# Patient Record
Sex: Female | Born: 1950 | Race: White | Hispanic: No | Marital: Married | Smoking: Former smoker
Health system: Southern US, Community
[De-identification: ages and names within clinical notes are randomized; demographics above are authoritative.]

## PROBLEM LIST (undated history)

## (undated) DIAGNOSIS — J449 Chronic obstructive pulmonary disease, unspecified: Secondary | ICD-10-CM

## (undated) DIAGNOSIS — I499 Cardiac arrhythmia, unspecified: Secondary | ICD-10-CM

## (undated) DIAGNOSIS — Z87442 Personal history of urinary calculi: Secondary | ICD-10-CM

## (undated) DIAGNOSIS — Z720 Tobacco use: Secondary | ICD-10-CM

## (undated) DIAGNOSIS — I4891 Unspecified atrial fibrillation: Secondary | ICD-10-CM

## (undated) DIAGNOSIS — F419 Anxiety disorder, unspecified: Secondary | ICD-10-CM

## (undated) DIAGNOSIS — R911 Solitary pulmonary nodule: Secondary | ICD-10-CM

## (undated) DIAGNOSIS — N10 Acute tubulo-interstitial nephritis: Secondary | ICD-10-CM

## (undated) DIAGNOSIS — T380X5A Adverse effect of glucocorticoids and synthetic analogues, initial encounter: Secondary | ICD-10-CM

## (undated) DIAGNOSIS — I10 Essential (primary) hypertension: Secondary | ICD-10-CM

## (undated) DIAGNOSIS — I739 Peripheral vascular disease, unspecified: Secondary | ICD-10-CM

## (undated) DIAGNOSIS — C7800 Secondary malignant neoplasm of unspecified lung: Secondary | ICD-10-CM

## (undated) DIAGNOSIS — I255 Ischemic cardiomyopathy: Secondary | ICD-10-CM

## (undated) DIAGNOSIS — J189 Pneumonia, unspecified organism: Secondary | ICD-10-CM

## (undated) DIAGNOSIS — I779 Disorder of arteries and arterioles, unspecified: Secondary | ICD-10-CM

## (undated) DIAGNOSIS — I2510 Atherosclerotic heart disease of native coronary artery without angina pectoris: Secondary | ICD-10-CM

## (undated) DIAGNOSIS — IMO0001 Reserved for inherently not codable concepts without codable children: Secondary | ICD-10-CM

## (undated) DIAGNOSIS — C649 Malignant neoplasm of unspecified kidney, except renal pelvis: Secondary | ICD-10-CM

## (undated) DIAGNOSIS — E785 Hyperlipidemia, unspecified: Secondary | ICD-10-CM

## (undated) DIAGNOSIS — E099 Drug or chemical induced diabetes mellitus without complications: Secondary | ICD-10-CM

## (undated) HISTORY — PX: TONSILLECTOMY: SUR1361

## (undated) HISTORY — DX: Peripheral vascular disease, unspecified: I73.9

## (undated) HISTORY — DX: Essential (primary) hypertension: I10

## (undated) HISTORY — DX: Hyperlipidemia, unspecified: E78.5

## (undated) HISTORY — DX: Ischemic cardiomyopathy: I25.5

## (undated) HISTORY — DX: Atherosclerotic heart disease of native coronary artery without angina pectoris: I25.10

## (undated) HISTORY — DX: Tobacco use: Z72.0

## (undated) HISTORY — PX: CARDIAC CATHETERIZATION: SHX172

## (undated) HISTORY — DX: Chronic obstructive pulmonary disease, unspecified: J44.9

---

## 2009-07-07 HISTORY — PX: CORONARY STENT PLACEMENT: SHX1402

## 2009-08-12 ENCOUNTER — Inpatient Hospital Stay (HOSPITAL_COMMUNITY): Admission: EM | Admit: 2009-08-12 | Discharge: 2009-08-16 | Payer: Self-pay | Admitting: Internal Medicine

## 2009-08-12 ENCOUNTER — Encounter: Payer: Self-pay | Admitting: Emergency Medicine

## 2009-08-12 ENCOUNTER — Ambulatory Visit: Payer: Self-pay | Admitting: Internal Medicine

## 2009-08-12 ENCOUNTER — Ambulatory Visit: Payer: Self-pay | Admitting: Cardiology

## 2009-08-14 ENCOUNTER — Encounter: Payer: Self-pay | Admitting: Cardiology

## 2009-08-14 ENCOUNTER — Encounter (INDEPENDENT_AMBULATORY_CARE_PROVIDER_SITE_OTHER): Payer: Self-pay | Admitting: Internal Medicine

## 2009-08-17 ENCOUNTER — Telehealth: Payer: Self-pay | Admitting: Cardiology

## 2009-08-29 DIAGNOSIS — Z87891 Personal history of nicotine dependence: Secondary | ICD-10-CM | POA: Insufficient documentation

## 2009-08-29 DIAGNOSIS — J449 Chronic obstructive pulmonary disease, unspecified: Secondary | ICD-10-CM | POA: Insufficient documentation

## 2009-08-29 DIAGNOSIS — I10 Essential (primary) hypertension: Secondary | ICD-10-CM

## 2009-08-30 ENCOUNTER — Encounter: Payer: Self-pay | Admitting: Physician Assistant

## 2009-08-30 ENCOUNTER — Ambulatory Visit: Payer: Self-pay | Admitting: Cardiology

## 2009-08-30 DIAGNOSIS — F17200 Nicotine dependence, unspecified, uncomplicated: Secondary | ICD-10-CM | POA: Insufficient documentation

## 2009-08-30 DIAGNOSIS — I25119 Atherosclerotic heart disease of native coronary artery with unspecified angina pectoris: Secondary | ICD-10-CM | POA: Insufficient documentation

## 2009-08-30 DIAGNOSIS — I739 Peripheral vascular disease, unspecified: Secondary | ICD-10-CM

## 2009-10-12 ENCOUNTER — Encounter: Payer: Self-pay | Admitting: Cardiovascular Disease

## 2009-10-12 ENCOUNTER — Ambulatory Visit: Payer: Self-pay

## 2009-10-15 ENCOUNTER — Ambulatory Visit: Payer: Self-pay | Admitting: Cardiovascular Disease

## 2009-10-24 ENCOUNTER — Ambulatory Visit: Payer: Self-pay | Admitting: Cardiology

## 2009-10-24 ENCOUNTER — Encounter (INDEPENDENT_AMBULATORY_CARE_PROVIDER_SITE_OTHER): Payer: Self-pay | Admitting: *Deleted

## 2009-10-24 ENCOUNTER — Ambulatory Visit: Payer: Self-pay

## 2009-10-24 ENCOUNTER — Ambulatory Visit (HOSPITAL_COMMUNITY): Admission: RE | Admit: 2009-10-24 | Discharge: 2009-10-24 | Payer: Self-pay | Admitting: Cardiovascular Disease

## 2010-02-18 ENCOUNTER — Telehealth: Payer: Self-pay | Admitting: Cardiovascular Disease

## 2010-04-09 ENCOUNTER — Ambulatory Visit: Payer: Self-pay | Admitting: Cardiovascular Disease

## 2010-05-06 ENCOUNTER — Ambulatory Visit: Payer: Self-pay | Admitting: Cardiovascular Disease

## 2010-05-06 LAB — CONVERTED CEMR LAB
BUN: 14 mg/dL
Basophils Absolute: 0.1 10*3/uL
Basophils Relative: 0.6 %
CO2: 29 meq/L
Calcium: 9 mg/dL
Chloride: 97 meq/L
Creatinine, Ser: 0.6 mg/dL
Eosinophils Absolute: 0.2 10*3/uL
Eosinophils Relative: 2.1 %
GFR calc non Af Amer: 111.38 mL/min
Glucose, Bld: 93 mg/dL
HCT: 40.1 %
Hemoglobin: 13.8 g/dL
INR: 1.1 — ABNORMAL HIGH
Lymphocytes Relative: 31 %
Lymphs Abs: 2.9 10*3/uL
MCHC: 34.5 g/dL
MCV: 93.8 fL
Monocytes Absolute: 0.6 10*3/uL
Monocytes Relative: 5.9 %
Neutro Abs: 5.7 10*3/uL
Neutrophils Relative %: 60.4 %
Platelets: 314 10*3/uL
Potassium: 4.4 meq/L
Prothrombin Time: 11.4 s
RBC: 4.28 M/uL
RDW: 13.8 %
Sodium: 140 meq/L
WBC: 9.5 10*3/uL

## 2010-05-08 ENCOUNTER — Ambulatory Visit: Payer: Self-pay | Admitting: Cardiovascular Disease

## 2010-05-08 ENCOUNTER — Ambulatory Visit (HOSPITAL_COMMUNITY): Admission: RE | Admit: 2010-05-08 | Discharge: 2010-05-08 | Payer: Self-pay | Admitting: Cardiovascular Disease

## 2010-05-16 ENCOUNTER — Encounter: Payer: Self-pay | Admitting: Cardiovascular Disease

## 2010-05-22 ENCOUNTER — Ambulatory Visit: Payer: Self-pay | Admitting: Cardiovascular Disease

## 2010-06-10 ENCOUNTER — Ambulatory Visit: Payer: Self-pay | Admitting: Cardiovascular Disease

## 2010-06-11 LAB — CONVERTED CEMR LAB
CO2: 29 meq/L (ref 19–32)
Chloride: 101 meq/L (ref 96–112)
Eosinophils Absolute: 0.2 10*3/uL (ref 0.0–0.7)
Glucose, Bld: 96 mg/dL (ref 70–99)
HCT: 40.4 % (ref 36.0–46.0)
Hemoglobin: 14.1 g/dL (ref 12.0–15.0)
INR: 1 (ref 0.8–1.0)
Lymphs Abs: 2.8 10*3/uL (ref 0.7–4.0)
MCHC: 34.8 g/dL (ref 30.0–36.0)
MCV: 94.7 fL (ref 78.0–100.0)
Monocytes Absolute: 0.7 10*3/uL (ref 0.1–1.0)
Neutro Abs: 6.8 10*3/uL (ref 1.4–7.7)
Potassium: 4.6 meq/L (ref 3.5–5.1)
Prothrombin Time: 11.2 s (ref 9.7–11.8)
RBC: 4.27 M/uL (ref 3.87–5.11)
RDW: 14.3 % (ref 11.5–14.6)
Sodium: 137 meq/L (ref 135–145)

## 2010-06-12 ENCOUNTER — Ambulatory Visit (HOSPITAL_COMMUNITY)
Admission: RE | Admit: 2010-06-12 | Discharge: 2010-06-12 | Payer: Self-pay | Source: Home / Self Care | Attending: Cardiovascular Disease | Admitting: Cardiovascular Disease

## 2010-06-20 ENCOUNTER — Ambulatory Visit: Payer: Self-pay | Admitting: Cardiovascular Disease

## 2010-07-03 ENCOUNTER — Encounter: Payer: Self-pay | Admitting: Cardiovascular Disease

## 2010-07-05 ENCOUNTER — Ambulatory Visit: Payer: Self-pay

## 2010-07-05 ENCOUNTER — Ambulatory Visit: Payer: Self-pay | Admitting: Cardiovascular Disease

## 2010-07-09 ENCOUNTER — Telehealth: Payer: Self-pay | Admitting: Cardiovascular Disease

## 2010-07-18 ENCOUNTER — Ambulatory Visit
Admission: RE | Admit: 2010-07-18 | Discharge: 2010-07-18 | Payer: Self-pay | Source: Home / Self Care | Attending: Cardiovascular Disease | Admitting: Cardiovascular Disease

## 2010-07-18 ENCOUNTER — Ambulatory Visit: Admission: RE | Admit: 2010-07-18 | Discharge: 2010-07-18 | Payer: Self-pay | Source: Home / Self Care

## 2010-07-18 ENCOUNTER — Encounter: Payer: Self-pay | Admitting: Cardiovascular Disease

## 2010-08-06 NOTE — Progress Notes (Signed)
Summary: NEW PT HAVING ACID REFLUX WANT TO KNOW WHAT SHE CAN TAKE  Phone Note Call from Patient Call back at Home Phone [phone]   Caller: Patient Summary of Call: PT SAID EVERYTIME SHE EATS SHE GETS ACID REFLUX FEELS LIKE HER FOOD IS COMING BACK UP. Initial call taken by: Judie Grieve,  August 17, 2009 3:15 PM  Follow-up for Phone Call        SPOKE WITH SON INSTRUCTED FOR PT TO TAKE TUMS AND WILL FORWARD TO DR Saliah Crisp  TO GET TX PLAN IF WISHES TO ADD NEW DRUG WILL CALL BACK VERBALIZED UNDERSTANDING. Follow-up by: Scherrie Bateman, LPN,  August 17, 2009 3:28 PM  Additional Follow-up for Phone Call Additional follow up Details #1::        I cannot find any record that i have seen this patient; reflux should be addressed by her primary care. Ferman Hamming, MD, [facility]  August 18, 2009 1:12 PM PER PT, REFLUX IS NO BETTER "I HAVE TRIED EVERYTHING" INSTRUCTED PT TO FOLLOW UP WITH PRIMARY MD.  Francis Dowse HAS AN APPT 08/23/2009  Additional Follow-up by: Charolotte Capuchin, RN,  August 20, 2009 3:14 PM

## 2010-08-06 NOTE — Assessment & Plan Note (Signed)
Summary: PER CHECK OUT/SF   Visit Type:  6 mo f/u Primary Allison [name]:  Dr. Lewis Moccasin  CC:  edema/hands.....bilateral leg pain w/walking...denies cp or sob.  History of Present Illness: [name] with history of HTN, hyperlipidemia, CAD admitted with NSTEMI, respiratory failure February 2011, PVD here today for follow up. She underwent cardiac cath showing severe stenosis of the Circumflex, now s/p DES to the Circumflex. Her EF was found to be 30%, presumed to be non-ischemic in etiology. Echo with EF of 40-45%. She was discharged home on a beta blocker, statin, ASA and ARB. Her femoral angiogram showed a 50% stenosis right SFA. Non-invasive studies in April with right ABI 0.93, Left ABI 0.70. There was suggestion of moderate disease in the left SFA ostium and moderate disease in the right SFA ostium.   She has stopped smoking. No chest pain, No SOB. Occasionally feels palpitations.  These only last for a few seconds. She has developed pain in both legs with walking 100 yards. This is described as cramping both calf muscles. Resolves with rest. No rest pain or ulcerations.    Current Medications (verified): 1)  Aspirin Ec 325 Mg Tbec (Aspirin) .... Take One Tablet By Mouth Daily 2)  Plavix 75 Mg Tabs (Clopidogrel Bisulfate) .Marland Kitchen.. 1 Tab Once Daily 3)  Metoprolol Tartrate 25 Mg Tabs (Metoprolol Tartrate) .Marland Kitchen.. 1 Tab Two Times A Day 4)  Cozaar 100 Mg Tabs (Losartan Potassium) .Marland Kitchen.. 1 Tab Once Daily 5)  Paxil 40 Mg Tabs (Paroxetine Hcl) .Marland Kitchen.. 1 Tab Once Daily  Allergies: 1)  ! Augmentin 2)  ! Codeine 3)  ! Ace Inhibitors  Past History:  Past Medical History:  1. Hypertension   2. COPD.   3. History of tobacco abuse 4. CAD with PCI, DES Circumflex 2/11. Moderate disease LAD. 5. Hyperlipidemia 6. PVD  Past Surgical History: Reviewed history from 10/15/2009 and no changes required. Tonsillectomy  Family History: Reviewed history from 10/15/2009 and no changes required. Mother, deceased,   coronary artery bypass graft  Father-deceased, cirrhosis Brother alive, unkown history  Social History: Reviewed history from 10/15/2009 and no changes required. 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  She has 3 biologic   children as well as 6 adopted children.   She recently quit smoking,  however, she has smoked 1-2 packs of cigarettes daily since the age of  7.  80 pack years.   She does not drink or use illicit drugs.   Review of Systems  The patient denies fatigue, malaise, fever, weight gain/loss, vision loss, decreased hearing, hoarseness, chest pain, palpitations, shortness of breath, prolonged cough, wheezing, sleep apnea, coughing up blood, abdominal pain, blood in stool, nausea, vomiting, diarrhea, heartburn, incontinence, blood in urine, muscle weakness, joint pain, leg swelling, rash, skin lesions, headache, fainting, dizziness, depression, anxiety, enlarged lymph nodes, easy bruising or bleeding, and environmental allergies.         Leg pain with walking. See HPI  Vital Signs:  Patient profile:   60 year old female Height:      67 inches Weight:      169.8 pounds BMI:     26.69 Pulse rate:   72 / minute Pulse rhythm:   regular BP sitting:   148 / 76  (left arm) Cuff size:   large  Vitals Entered By: Danielle Rankin, CMA (April 09, 2010 9:05 AM)  Physical Exam  General:  General: Well developed, well nourished, NAD HEENT: OP clear, mucus membranes moist SKIN: warm, dry Neuro: No  focal deficits Musculoskeletal: Muscle strength 5/5 all ext Psychiatric: Mood and affect normal Neck: No JVD, no carotid bruits, no thyromegaly, no lymphadenopathy. Lungs:Clear bilaterally, no wheezes, rhonci, crackles CV: RRR no murmurs, gallops rubs Abdomen: soft, NT, ND, BS present Extremities: No edema, pulses non-palpable bilateral DP/PT    Echocardiogram  Procedure date:  10/24/2009  Findings:      Left ventricle: The cavity size was normal. Wall thickness was       normal.  Systolic function was normal. The estimated ejection       fraction was in the range of 50% to 55%. There is severe       hypokinesis of the basal-mid inferior myocardium. Doppler       parameters are consistent with high ventricular filling pressure.     - Aortic valve: Mild regurgitation.     - Mitral valve: Calcified annulus. Mild regurgitation.     - Left atrium: The atrium was moderately dilated.     - Right atrium: The atrium was mildly dilated.  Impression & Recommendations:  Problem # 1:  CAD, NATIVE VESSEL (ICD-414.01) Stable. Will need dual antiplatelet therapy with ASA and Plavix for at least one year post DES (placed February 2011).   Her updated medication list for this problem includes:    Aspirin Ec 325 Mg Tbec (Aspirin) .Marland Kitchen... Take one tablet by mouth daily    Plavix 75 Mg Tabs (Clopidogrel bisulfate) .Marland Kitchen... 1 tab once daily    Metoprolol Tartrate 50 Mg Tabs (Metoprolol tartrate) .Marland Kitchen... Take one tablet by mouth twice a day. take one in the am and one in the evening.  Problem # 2:  HYPERTENSION (ICD-401.9)  Increase metoprolol to 50 mg by mouth two times a day.   Her updated medication list for this problem includes:    Aspirin Ec 325 Mg Tbec (Aspirin) .Marland Kitchen... Take one tablet by mouth daily    Metoprolol Tartrate 50 Mg Tabs (Metoprolol tartrate) .Marland Kitchen... Take one tablet by mouth twice a day. take one in the am and one in the evening.    Cozaar 100 Mg Tabs (Losartan potassium) .Marland Kitchen... 1 tab once daily  Her updated medication list for this problem includes:    Aspirin Ec 325 Mg Tbec (Aspirin) .Marland Kitchen... Take one tablet by mouth daily    Metoprolol Tartrate 25 Mg Tabs (Metoprolol tartrate) .Marland Kitchen... 1 tab two times a day    Cozaar 100 Mg Tabs (Losartan potassium) .Marland Kitchen... 1 tab once daily  Problem # 3:  PVD (ICD-443.9) She is having bilateral leg pain with ambulation. Will plan distal aortogram with bilateral lower ext runoff on May 08, 2010. Risks and benefits reviewed. Labs week of  procedure.   Other Orders: PV Procedure (PV Procedure)  Patient Instructions: 1)  Your physician recommends that you schedule a follow-up appointment in: 6 weeks 2)  Your physician has recommended you make the following change in your medication: INCREASE your METOPROLOL to 50mg  by mouth two times a day. 3)  Your physician has requested that you have a peripheral vascular angiogram on Nov.2nd, 2011 @ 10am. This exam is performed at the hospital. During this exam IV contrast is used to look at arterial blood flow.  Please review the information sheet given for details. 4)  Your physician recommends that you return for lab work on Monday OCT. 31, 2011 anytime after 8:30 am. Prescriptions: METOPROLOL TARTRATE 50 MG TABS (METOPROLOL TARTRATE) Take one tablet by mouth twice a day. TAKE ONE IN THE AM AND  ONE IN THE EVENING.  #60 x 3   Entered by:   Whitney Maeola Sarah RN   Authorized by:   Verne Carrow, MD   Signed by:   Ellender Hose RN on 04/09/2010   Method used:   Electronically to        [facility] (retail)       [address]       [place], [place]  [address]       Ph: [phone]       Fax: [phone]   RxID:   [id number]

## 2010-08-06 NOTE — Assessment & Plan Note (Signed)
Summary: per check out with pa/saf   Visit Type:  Follow-up Primary Provider:  Dr. Lewis Moccasin  CC:  No complaints..  History of Present Illness: [name] with history of HTN, hyperlipidemia, CAD admitted with NSTEMI, respiratory failure February 2011, PVD here today for follow up. She underwent cardiac cath showing severe stenosis of the Circumflex, now s/p DES to the Circumflex. Her EF was found to be 30%, presumed to be non-ischemic in etiology. Echo with EF of 40-45%. She was discharged home on a beta blocker, statin, ASA and ARB. Her femoral angiogram showed a 50% stenosis right SFA. Non-invasive studies last week with with right ABI 0.93, Left ABI 0.70. There was suggestion of moderate disease in the left SFA ostium and moderate disease in the right SFA ostium. She has had no claudication or rest pain.   She has stopped smoking. No chest pain, No SOB. Occasionally feels palpitations.  These only last for a few seconds. She has been performing her activities of daily living without problems.   Current Medications (verified): 1)  Aspirin Ec 325 Mg Tbec (Aspirin) .... Take One Tablet By Mouth Daily 2)  Plavix 75 Mg Tabs (Clopidogrel Bisulfate) .Marland Kitchen.. 1 Tab Once Daily 3)  Metoprolol Tartrate 25 Mg Tabs (Metoprolol Tartrate) .Marland Kitchen.. 1 Tab Two Times A Day 4)  Cozaar 100 Mg Tabs (Losartan Potassium) .Marland Kitchen.. 1 Tab Once Daily 5)  Simvastatin 20 Mg Tabs (Simvastatin) .Marland Kitchen.. 1 Tab At Bedtime 6)  Paxil 40 Mg Tabs (Paroxetine Hcl) .Marland Kitchen.. 1 Tab Once Daily  Allergies: 1)  ! Augmentin 2)  ! Codeine 3)  ! Ace Inhibitors  Past History:  Past Medical History:  1. Hypertension   2. COPD.   3. History of tobacco abuse 4. CAD with PCI, DES Circumflex 2/11. Moderate disease LAD. 5. 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   She has 3 biologic   children as well as 6 adopted children.   She recently quit smoking,  however, she has smoked 1-2 packs of cigarettes daily since the age of  49.  80 pack years.   She does not drink or use illicit drugs.   Review of Systems       The patient complains of palpitations.  The patient denies fatigue, malaise, fever, weight gain/loss, vision loss, decreased hearing, hoarseness, chest pain, shortness of breath, prolonged cough, wheezing, sleep apnea, coughing up blood, abdominal pain, blood in stool, nausea, vomiting, diarrhea, heartburn, incontinence, blood in urine, muscle weakness, joint pain, leg swelling, rash, skin lesions, headache, fainting, dizziness, depression, anxiety, enlarged lymph nodes, easy bruising or bleeding, and environmental allergies.    Vital Signs:  Patient profile:   60 year old female Height:      67 inches Weight:      153 pounds BMI:     24.05 Pulse rate:   69 / minute Pulse rhythm:   regular BP sitting:   158 / 70  (left arm) Cuff size:   regular  Vitals Entered By: Danielle Rankin, CMA (October 15, 2009 10:08 AM)  Physical Exam  General:  General: Well developed, well nourished, NAD HEENT: OP clear, mucus membranes moist SKIN: warm, dry Neuro: No focal deficits Musculoskeletal: Muscle strength 5/5 all ext Psychiatric: Mood and affect normal Neck: No JVD, no carotid bruits, no thyromegaly, no lymphadenopathy. Lungs:Clear bilaterally, no wheezes, rhonci, crackles CV:  RRR no murmurs, gallops rubs Abdomen: soft, NT, ND, BS present Extremities: No edema, pulses 1+ right DP/PT. Trace to 1+ left DP/PT.     Arterial Doppler  Procedure date:  10/12/2009  Findings:      Right ABI 0.93. Left ABI 0.70. Moderate stenosis right SFA (1-49%) >50% left SFA stenosis.  Echocardiogram  Procedure date:  08/14/2009  Findings:       Left ventricle: The cavity size was normal. Wall thickness was     increased in a pattern of mild LVH. Systolic  function was mildly     to moderately reduced. The estimated ejection fraction was in the     range of 40% to 45%. There is hypokinesis of the mid-distal     anteroseptal myocardium. There is hypokinesis of the apical     myocardium. Doppler parameters are consistent with high     ventricular filling pressure.   - Aortic valve: Trivial regurgitation.   - Mitral valve: Calcified annulus. Mildly thickened leaflets . Mild     regurgitation.   - Pulmonary arteries: Systolic pressure was mildly increased. PA     peak pressure: 39mm Hg (S).  Impression & Recommendations:  Problem # 1:  CAD, NATIVE VESSEL (ICD-414.01) Stable. NSTEMI February with DES to Circumflex. She has moderate disease in the LAD and the small RCA. Continue current medical therapy as below.   Her updated medication list for this problem includes:    Aspirin Ec 325 Mg Tbec (Aspirin) .Marland Kitchen... Take one tablet by mouth daily    Plavix 75 Mg Tabs (Clopidogrel bisulfate) .Marland Kitchen... 1 tab once daily    Metoprolol Tartrate 25 Mg Tabs (Metoprolol tartrate) .Marland Kitchen... 1 tab two times a day  Problem # 2:  CARDIOMYOPATHY, SECONDARY (ICD-425.9) Etiology unclear at this time. Likely non-ischemic. Continue current therapy. Will repeat echo in next few weeks.   Her updated medication list for this problem includes:    Aspirin Ec 325 Mg Tbec (Aspirin) .Marland Kitchen... Take one tablet by mouth daily    Plavix 75 Mg Tabs (Clopidogrel bisulfate) .Marland Kitchen... 1 tab once daily    Metoprolol Tartrate 25 Mg Tabs (Metoprolol tartrate) .Marland Kitchen... 1 tab two times a day    Cozaar 100 Mg Tabs (Losartan potassium) .Marland Kitchen... 1 tab once daily  Orders: Echocardiogram (Echo)  Problem # 3:  PVD (ICD-443.9) Her non-invasive studies are suggestive of moderate disease, left ABI is reduced more than right ABI. She is having no claudication. No further invasive workup since she is asymptomatic. Repeat ABI 6 months.   Patient Instructions: 1)  Your physician recommends that you schedule a  follow-up appointment in: 6 months 2)  Your physician has requested that you have an echocardiogram.  Echocardiography is a painless test that uses sound waves to create images of your heart. It provides your doctor with information about the size and shape of your heart and how well your heart's chambers and valves are working.  This procedure takes approximately one hour. There are no restrictions for this procedure.

## 2010-08-06 NOTE — Progress Notes (Signed)
Summary: rx plavix  Phone Note Refill Request Message from:  Patient on February 18, 2010 3:26 PM  Refills Requested: Medication #1:  PLAVIX 75 MG TABS 1 tab once daily [facility] [phone]  Initial call taken by: Judie Grieve,  February 18, 2010 3:26 PM    Prescriptions: PLAVIX 75 MG TABS (CLOPIDOGREL BISULFATE) 1 tab once daily  #30 x 11   Entered by:   Danielle Rankin, CMA   Authorized by:   Verne Carrow, MD   Signed by:   Danielle Rankin, CMA on 02/18/2010   Method used:   Electronically to        [facility] (retail)       [address]       [place], [place]  [address]       Ph: [phone]       Fax: [phone]   RxID:   [id number]

## 2010-08-06 NOTE — Assessment & Plan Note (Signed)
Summary: PER CHECK OUT/SF   Visit Type:  6 wk f/u Primary Allison [name]:  Dr. Lewis [name]  CC:  None.  History of Present Illness: [name] with history of HTN, hyperlipidemia, CAD admitted with NSTEMI, respiratory failure February 2011, PVD here today for PV follow up. She underwent cardiac cath showing severe stenosis of the Circumflex, now s/p DES to the Circumflex. Her EF was found to be 30%, presumed to be non-ischemic in etiology. Echo with EF of 40-45%. She was discharged home on a beta blocker, statin, ASA and ARB. Her femoral angiogram showed a  stenosis right SFA. Non-invasive studies in April with right ABI 0.93, Left ABI 0.70. There was suggestion of moderate disease in the left SFA ostium and moderate disease in the right SFA ostium. She had developed pain in both legs with walking 100 yards. This was described as cramping both calf muscles. Resolved with rest. No rest pain or ulcerations. I arranged a distal aortogram with bilateral lower extremity runoff on 05/08/10. She was found to have a a severe stenosis at the ostium of the left SFA and moderate stenosis at the ostium of the right SFA. The ostium of the right profunda femoral artery had a severe stenosis. (Full report below).   She is here today for procedure follow up. She has been doing well. No change in symptoms. No new problems post diagnostic procedure.   Current Medications (verified): 1)  Aspirin Ec 325 Mg Tbec (Aspirin) .... Take One Tablet By Mouth Daily 2)  Plavix 75 Mg Tabs (Clopidogrel Bisulfate) .Marland Kitchen.. 1 Tab Once Daily 3)  Metoprolol Tartrate 50 Mg Tabs (Metoprolol Tartrate) .... Take One Tablet By Mouth Twice A Day. Take One in The Am and One in The Evening. 4)  Cozaar 100 Mg Tabs (Losartan Potassium) .Marland Kitchen.. 1 Tab Once Daily 5)  Paxil 40 Mg Tabs (Paroxetine Hcl) .Marland Kitchen.. 1 Tab Once Daily 6)  Simvastatin 20 Mg Tabs (Simvastatin) .Marland Kitchen.. 1 Tab At Bedtime  Allergies: 1)  ! Augmentin 2)  ! Codeine 3)  ! Ace Inhibitors  Past  History:  Past Medical History:  1. Hypertension   2. COPD.   3. History of tobacco abuse 4. CAD with PCI, DES Circumflex 2/11. Moderate disease LAD. 5. Hyperlipidemia 6. PVD- lower ext runoff 05/08/10   Past Surgical History: Reviewed history from 10/15/2009 and no changes required. Tonsillectomy  Family History: Reviewed history from 10/15/2009 and no changes required. Mother, deceased,  coronary artery bypass graft  Father-deceased, cirrhosis Brother alive, unkown history  Social History: Reviewed history from 10/15/2009 and no changes required. 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  She has 3 biologic   children as well as 6 adopted children.   She recently quit smoking,  however, she has smoked 1-2 packs of cigarettes daily since the age of  57.  80 pack years.   She does not drink or use illicit drugs.   Review of Systems  The patient denies fatigue, malaise, fever, weight gain/loss, vision loss, decreased hearing, hoarseness, chest pain, palpitations, shortness of breath, prolonged cough, wheezing, sleep apnea, coughing up blood, abdominal pain, blood in stool, nausea, vomiting, diarrhea, heartburn, incontinence, blood in urine, muscle weakness, joint pain, leg swelling, rash, skin lesions, headache, fainting, dizziness, depression, anxiety, enlarged lymph nodes, easy bruising or bleeding, and environmental allergies.         Bilateral lower ext pain with ambulation.   Vital Signs:  Patient profile:   60 year old female Height:  67 inches Weight:      175.4 pounds BMI:     27.57 Pulse rate:   76 / minute Pulse rhythm:   regular BP sitting:   120 / 58  (left arm) Cuff size:   large  Vitals Entered By: Allison [name], CMA (May 22, 2010 8:45 AM)  Physical Exam  General:  General: Well developed, well nourished, NAD HEENT: OP clear, mucus membranes moist SKIN: warm, dry Neuro: No focal deficits Musculoskeletal: Muscle strength 5/5 all ext Psychiatric: Mood and  affect normal Neck: No JVD, no carotid bruits, no thyromegaly, no lymphadenopathy. Lungs:Clear bilaterally, no wheezes, rhonci, crackles CV: RRR no murmurs, gallops rubs Abdomen: soft, NT, ND, BS present Extremities: No edema, pulses non-palpable left  DP/PT, 1+ right DP/PT.      Arteriogram-Lower Extremity  Procedure date:  05/08/2010  Findings:      1. Right renal artery by 20% proximal stenosis. 2. The left renal artery has mild plaque disease. 3. 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 4. The right common iliac artery has 30% stenosis.  The right external     iliac artery had diffuse 20-30% disease, no focal lesion.  The     right internal  iliac artery has a discrete 90% stenosis.  The right     profunda femoral artery had a 99% ostial stenosis.  The right     superficial femoral artery had an ostial 60% stenosis.  The mid     right superficial femoral artery had mild plaque disease.  The     distal right superficial femoral artery has 30% stenosis.  The     right popliteal artery was patent.  There was 3-vessel runoff to     the right foot; however, the distal vessels were diffusely diseased     and became small in caliber. 5. The left common iliac artery had 20% stenosis.  The left external     iliac artery had diffuse plaque disease.  The left internal iliac     artery has diffuse 50% plaque.  The left superficial femoral artery     had an ostial 95% stenosis.  The left proximal superficial femoral     artery had 80% stenosis.  The mid and distal left superficial     femoral artery has diffuse 50-60% stenosis.  There appeared to be 2-     vessel runoffs to the left foot with the peroneal artery not     reaching the foot.   IMPRESSION: 1. Severe peripheral vascular disease. 2. Severe stenosis in the ostium of the left superficial femoral     artery, ostium of the right profunda femoral artery, and moderate stenosis in  the     proximal right superficial femoral  artery.   Impression & Recommendations:  Problem # 1:  PVD (ICD-443.9) I have discussed PTA with [place] atherectomy of the ostial lesion in the left SFA. I think we can manage the moderate stenosis in the right SFA ostium medically at this time. Risks and benefits of procedure are reviewed today. We will [facility] at [facility]. Pre-procedure labs week of procedure.   Patient Instructions: 1)  Your physician recommends that you schedule a follow-up appointment in: 4 weeks. 2)  Your physician recommends that you continue on your current medications as directed. Please refer to the Current Medication list given to you today. Prescriptions: SIMVASTATIN 20 MG TABS (SIMVASTATIN) 1 tab at bedtime  #30 x 11  Entered by:   Allison [name], CMA   Authorized by:   Verne Carrow, MD   Signed by:   Allison [name], CMA on 05/22/2010   Method used:   Electronically to        [facility] (retail)       [address]       [place], [place]  [address]       Ph: [phone]       Fax: [phone]   RxID:   [id number]

## 2010-08-06 NOTE — Assessment & Plan Note (Signed)
Summary: eph/dr crenshaw/per dc summary/jss   Visit Type:  EPH Primary Provider:  Dr. Lewis Moccasin  CC:  sob a times when she walks...denies any cp or edema.  History of Present Illness: This is a 60 year old white female patient, who was admitted to the hospital with respiratory failure and intubated. She had elevated cardiac enzymes and abnormal EKG and had a non-ST elevation MI August 15, 1998 left. She underwent drug-eluting stent to the distal circumflex. She had severe hypokinesis of the anterior apical wall, as well as the inferior basilar segment. Ejection fraction was 30%. She was found to have 50-60% stenosis of the proximal right superficial femoral artery as well.  Patient denies chest pain, palpitations, dyspnea, dyspnea on exertion, dizziness, or spree syncope. She is walking around her house without difficulty. She did not qualify for cardiac rehabilitation because of Medicaid. She has quit smoking.  Current Medications (verified): 1)  Aspirin Ec 325 Mg Tbec (Aspirin) .... Take One Tablet By Mouth Daily 2)  Plavix 75 Mg Tabs (Clopidogrel Bisulfate) .Marland Kitchen.. 1 Tab Once Daily 3)  Metoprolol Tartrate 25 Mg Tabs (Metoprolol Tartrate) .Marland Kitchen.. 1 Tab Two Times A Day 4)  Cozaar 25 Mg Tabs (Losartan Potassium) .Marland Kitchen.. 1 Tab Once Daily  Allergies (verified): 1)  ! Augmentin 2)  ! Codeine 3)  ! Ace Inhibitors  Past History:  Past Medical History: Last updated: 08/29/2009  1. Hypertension   2. COPD.   3. History of tobacco abuse  Review of Systems       see history of present illness  Vital Signs:  Patient profile:   60 year old female Height:      67 inches Weight:      149 pounds BMI:     23.42 Pulse rate:   59 / minute Pulse rhythm:   irregular BP sitting:   146 / 80  (left arm) Cuff size:   regular  Vitals Entered By: Danielle Rankin, CMA (August 30, 2009 9:57 AM)  Physical Exam  General:   Well-nournished, in no acute distress. Neck: No JVD, HJR, Bruit, or thyroid  enlargement Lungs: No tachypnea, clear without wheezing, rales, or rhonchi Cardiovascular: RRR, PMI not displaced, 1/6 systolic murmur at the apex, no gallops, bruit, thrill, or heave. Abdomen: BS normal. Soft without organomegaly, masses, lesions or tenderness. Extremities: right parietal hematoma or hemorrhage,without cyanosis, clubbing or edema. Good distal pulses bilateral SKin: Warm, no lesions or rashes  Musculoskeletal: No deformities Neuro: no focal signs    EKG  Procedure date:  08/30/2009  Findings:      normal sinus rhythm with poor R-wave progression and T-wave inversion laterally  Impression & Recommendations:  Problem # 1:  CAD, NATIVE VESSEL (ICD-414.01) Patient suffered a non-ST elevation MI in the setting of acute respiratory failure and intubation. She underwent successful stenting of the distal circumflex August 15, 2009. She is severe hypokinesis of the anterior apical wall and inferior basilar segment. Ejection fraction 30%.  Patient had lipid profile in the hospital and was told it was extremely low. She is having this repeated in March by her primary care physician.  Her updated medication list for this problem includes:    Aspirin Ec 325 Mg Tbec (Aspirin) .Marland Kitchen... Take one tablet by mouth daily    Plavix 75 Mg Tabs (Clopidogrel bisulfate) .Marland Kitchen... 1 tab once daily    Metoprolol Tartrate 25 Mg Tabs (Metoprolol tartrate) .Marland Kitchen... 1 tab two times a day  Problem # 2:  PERIPHERAL VASCULAR DISEASE (ICD-443.9) Patient was  found to have a 50% stenosis of the right superficial femoral artery at catheter She will have Doppler is performed before her next office appointment.  Problem # 3:  LEFT VENTRICULAR FUNCTION, DECREASED (ICD-429.2) Patient has an ejection fraction of 30%. She has no evidence of heart failure and is well compensated.  Problem # 4:  TOBACCO ABUSE (ICD-305.1) Patient quit smoking  Other Orders: EKG w/ Interpretation (93000) Arterial Duplex Lower  Extremity (Arterial Duplex Low)  Patient Instructions: 1)  Your physician recommends that you schedule a follow-up appointment in:2 months with Dr. Clifton James 2)  Your physician has requested that you have a lower or upper extremity arterial duplex.  This test is an ultrasound of the arteries in the legs or arms.  It looks at arterial blood flow in the legs and arms.  Allow one hour for Lower and Upper Arterial scans. There are no restrictions or special instructions.

## 2010-08-06 NOTE — Letter (Signed)
Summary: [facility] - Outpatient Coinsurance Notice  [facility] - Out[facility]   Imported By: Marylou Mccoy 10/25/2009 12:05:49  _____________________________________________________________________  External Attachment:    Type:   Image     Comment:   External Document

## 2010-08-06 NOTE — Letter (Signed)
Summary: Peripheral Vascular  [facility], Main Office  [address]   [place], [place] [address]   Phone: [phone]  Fax: [phone]     04/09/2010 MRN: [id number]  Allison [name] [address] [place], [place]  [address]  Dear Ms. Pigeon,   You are scheduled for Peripheral Vascular Angiogram on November 2nd, 2011  with Dr. Clifton James.  Please arrive at the [facility] at 8am       on the day of your procedure.  1. DIET     __X__ Nothing to eat or drink after midnight except your medications with a sip of water.  2. Come to the [place] office on 05/06/10  for lab work.  The lab at [facility] is open from 8:30 a.m. to 1:30 p.m. and 2:30 p.m. to 5:00 p.m.  The lab at [facility] is open from 7:30 a.m. to 5:30 p.m.  You do not have to be fasting.  3. MAKE SURE YOU TAKE YOUR ASPIRIN.        _X___ YOU MAY TAKE ALL of your remaining medications with a small amount of water.    4. Plan for one night stay - bring personal belongings (i.e. toothpaste, toothbrush, etc.)  5. Bring a current list of your medications and current insurance cards.  6. Must have a responsible person to drive you home.   7. Someone must be with yu for the first 24 hours after you arrive home.  8. Please wear clothes that are easy to get on and off and wear slip-on shoes.  *Special note: Every effort is made to have your procedure done on time.  Occasionally there are emergencies that present themselves at the hospital that may cause delays.  Please be patient if a delay does occur.  If you have any questions after you get home, please call the office at the number listed above.   Whitney Maeola Sarah RN

## 2010-08-06 NOTE — Letter (Signed)
Summary: Peripheral Vascular  [facility], Main Office  [address]   [place], [place] [address]   Phone: [phone]  Fax: [phone]     05/16/2010 MRN: [id number]  KEALY LEWTER [address] [place], [place]  [address]  Dear Ms. Lipsett,   You are scheduled for Peripheral Vascular Procedure on Wednesday 06/12/10         with Dr.Justyna Timoney.  Please arrive at the [facility] at 10:00      a.m. on the day of your procedure.  1. DIET     _x___ Nothing to eat or drink after midnight except your medications with a sip of water.  2. Come to the [place] office on   06/10/10          for lab work.  The lab at [facility] is open from 8:30 a.m. to 1:30 p.m. and 2:30 p.m. to 5:00 p.m.  The lab at [facility] is open from 7:30 a.m. to 5:30 p.m.  You do not have to be fasting.  3. MAKE SURE YOU TAKE YOUR ASPIRIN.       __X__ YOU MAY TAKE ALL of your remaining medications with a small amount of water.  4. Plan for one night stay - bring personal belongings (i.e. toothpaste, toothbrush, etc.)  5. Bring a current list of your medications and current insurance cards.  6. Must have a responsible person to drive you home.   7. Someone must be with yu for the first 24 hours after you arrive home.  8. Please wear clothes that are easy to get on and off and wear slip-on shoes.  *Special note: Every effort is made to have your procedure done on time.  Occasionally there are emergencies that present themselves at the hospital that may cause delays.  Please be patient if a delay does occur.  If you have any questions after you get home, please call the office at the number listed above.   Whitney Maeola Sarah RN

## 2010-08-06 NOTE — Consult Note (Signed)
Summary: [facility]   Imported By: Roderic Ovens 08/27/2009 14:36:14  _____________________________________________________________________  External Attachment:    Type:   Image     Comment:   External Document

## 2010-08-08 NOTE — Miscellaneous (Signed)
Summary: Orders Update  Clinical Lists Changes  Orders: Added new Test order of Arterial Duplex Lower Extremity (Arterial Duplex Low) - Signed 

## 2010-08-08 NOTE — Assessment & Plan Note (Signed)
Summary: Allison [name]   Primary Provider:  Dr. Lewis Moccasin  CC:  sob...pt states she has been feeling bad due to a cold..symptoms included cough... mucus and fatigue.  History of Present Illness: [name] with history of HTN, hyperlipidemia, CAD admitted with NSTEMI, respiratory failure February 2011, PVD here today for PV follow up. She underwent cardiac cath showing severe stenosis of the Circumflex, now s/p DES to the Circumflex. Her EF was found to be 30%, presumed to be non-ischemic in etiology. Echo with EF of 40-45%. She was discharged home on a beta blocker, statin, ASA and ARB. Her femoral angiogram showed a  stenosis right SFA. Non-invasive studies in April with right ABI 0.93, Left ABI 0.70. There was suggestion of moderate disease in the left SFA ostium and moderate disease in the right SFA ostium. She had developed pain in both legs with walking 100 yards. This was described as cramping both calf muscles. Resolved with rest. No rest pain or ulcerations. I arranged a distal aortogram with bilateral lower extremity runoff on 05/08/10. She was found to have a a severe stenosis at the ostium of the left SFA and moderate stenosis at the ostium of the right SFA. The ostium of the right profunda femoral artery had a severe stenosis. Silverhawk atherectomy of the left SFA ostium was performed on 06/12/10. She is here today for follow up.  Her left leg is feeling better. She has a cold and has been feeling poorly. No rest pain or ulcerations. Non-invasive studies today unchanged.      Current Medications (verified): 1)  Aspirin Ec 325 Mg Tbec (Aspirin) .... Take One Tablet By Mouth Daily 2)  Plavix 75 Mg Tabs (Clopidogrel Bisulfate) .Marland Kitchen.. 1 Tab Once Daily 3)  Metoprolol Tartrate 50 Mg Tabs (Metoprolol Tartrate) .... Take One Tablet By Mouth Twice A Day. Take One in The Am and One in The Evening. 4)  Cozaar 100 Mg Tabs (Losartan Potassium) .Marland Kitchen.. 1 Tab Once Daily 5)  Paxil 40 Mg Tabs (Paroxetine Hcl) .Marland Kitchen.. 1 Tab  Once Daily 6)  Simvastatin 20 Mg Tabs (Simvastatin) .Marland Kitchen.. 1 Tab At Bedtime  Allergies: 1)  ! Augmentin 2)  ! Codeine 3)  ! Ace Inhibitors  Past History:  Past Medical History: Reviewed history from 05/22/2010 and no changes required.  1. Hypertension   2. COPD.   3. History of tobacco abuse 4. CAD with PCI, DES Circumflex 2/11. Moderate disease LAD. 5. Hyperlipidemia 6. PVD- lower ext runoff 05/08/10   Social History: Reviewed history from 10/15/2009 and no changes required. 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  She has 3 biologic   children as well as 6 adopted children.   She recently quit smoking,  however, she has smoked 1-2 packs of cigarettes daily since the age of  18.  80 pack years.   She does not drink or use illicit drugs.   Review of Systems  The patient denies fatigue, malaise, fever, weight gain/loss, vision loss, decreased hearing, hoarseness, chest pain, palpitations, shortness of breath, prolonged cough, wheezing, sleep apnea, coughing up blood, abdominal pain, blood in stool, nausea, vomiting, diarrhea, heartburn, incontinence, blood in urine, muscle weakness, joint pain, leg swelling, rash, skin lesions, headache, fainting, dizziness, depression, anxiety, enlarged lymph nodes, easy bruising or bleeding, and environmental allergies.         See HPI  Vital Signs:  Patient profile:   60 year old female Height:      67 inches Weight:      180  pounds BMI:     28.29 Pulse rate:   62 / minute Resp:     14 per minute BP sitting:   150 / 80  (left arm)  Vitals Entered By: Kem Parkinson (July 18, 2010 2:20 PM)  Physical Exam  General:  General: Well developed, well nourished, NAD Musculoskeletal: Muscle strength 5/5 all ext Psychiatric: Mood and affect normal Neck: No JVD, no carotid bruits, no thyromegaly, no lymphadenopathy. Lungs:Clear bilaterally, no wheezes, rhonci, crackles CV: RRR no murmurs, gallops rubs Abdomen: soft, NT, ND, BS  present Extremities: No edema, pulses 1+ bilateral DP/PT    Arteriogram-Lower Extremity  Procedure date:  06/12/2010  Findings:      we were able to gain access into the right femoral artery. A 7-French crossover sheath was inserted into the iliac system.  At this point, we used a Versacore wire and a crossover catheter and we were able be engage the left iliac system.  A 7-French sheath was then advanced around the aortic bifurcation and positioned into the distal portion of the left external iliac artery.  The patient was given 5000 units of intravenous heparin.  A Spartacor wire was then advanced through the sheath and placed distal to the stenosis in the distal superficial femoral artery.  When the patient's ACT was greater than 200, we inserted the Silverhawk LS-M atherectomy device.  Three passes were made in the severe stenosis in the ostium of the superficial femoral artery.  We pulled the device out and made sure there were no perforations or dissections.  There was a significant improvement in the lumen size of the vessel.  We also felt that there was severe stenosis in the proximal portion of the left superficial femoral artery.  Given the small size of the vessel, we made one pass with the atherectomy device.  There was a significant improvement in the stenosis.  The stenosis in the ostium was taken from 99% down to 10%.  The stenosis in the proximal portion of the vessel was taken from 80% down to 10%.  The patient tolerated the procedure well.  Final angiography demonstrated patency of all three vessels below the knee in the left leg.  There was no immediate complication.  The sheath remained in place in the right femoral artery.  The patient was taken to the holding area in stable condition.   IMPRESSION: 1. Peripheral arterial disease. 2. Successful atherectomy of the left superficial femoral artery.  ABI's  Procedure date:  07/18/2010  Findings:      ABI  0.80 on right, left 0.71.   Impression & Recommendations:  Problem # 1:  PVD (ICD-443.9) Stable. Continue walking daily. Continue ASA and Plavix. Repeat ABI 6 months.   Patient Instructions: 1)  Your physician recommends that you schedule a follow-up appointment in: 6 months.  2)  ABI 6 months.

## 2010-08-08 NOTE — Progress Notes (Signed)
Summary: rx sent in today.   Phone Note Refill Request Message from:  Patient on July 09, 2010 10:17 AM  Refills Requested: Medication #1:  METOPROLOL TARTRATE 50 MG TABS Take one tablet by mouth twice a day. TAKE ONE IN THE AM AND ONE IN THE EVENING.  Medication #2:  PAXIL 40 MG TABS 1 tab once daily [facility] [phone]  Initial call taken by: Judie Grieve,  July 09, 2010 10:17 AM  Follow-up for Phone Call        CMA s/w pt and advised she needs to get Paxil from PCP.Marland KitchenPt advised PCP not there anylonger. CMA advised pt she needs to set up w/new PCP, pt gave verbal understanding. CMA told pt she would call Paxil in today for her. Danielle Rankin, CMA  July 09, 2010 11:05 AM     New/Updated Medications: METOPROLOL TARTRATE 50 MG TABS (METOPROLOL TARTRATE) Take one tablet by mouth twice a day. TAKE ONE IN THE AM AND ONE IN THE EVENING. PAXIL 40 MG TABS (PAROXETINE HCL) 1 tab once daily Prescriptions: PAXIL 40 MG TABS (PAROXETINE HCL) 1 tab once daily  #30 x 1   Entered by:   Danielle Rankin, CMA   Authorized by:   Verne Carrow, MD   Signed by:   Danielle Rankin, CMA on 07/09/2010   Method used:   Electronically to        [facility] (retail)       [address]       [place], [place]  [address]       Ph: [phone]       Fax: [phone]   RxID:   [id number] METOPROLOL TARTRATE 50 MG TABS (METOPROLOL TARTRATE) Take one tablet by mouth twice a day. TAKE ONE IN THE AM AND ONE IN THE EVENING.  #60 x 11   Entered by:   Danielle Rankin, CMA   Authorized by:   Verne Carrow, MD   Signed by:   Danielle Rankin, CMA on 07/09/2010   Method used:   Electronically to        [facility] (retail)       [address]       [place], [place]  [address]       Ph: [phone]       Fax: [phone]   RxID:   [id number]

## 2010-09-06 ENCOUNTER — Telehealth: Payer: Self-pay | Admitting: Cardiovascular Disease

## 2010-09-12 NOTE — Progress Notes (Signed)
Summary: refill  Phone Note Refill Request Call back at Home Phone [phone] Message from:  Patient on September 06, 2010 1:35 PM  Refills Requested: Medication #1:  HCTZ 25mg   Medication #2:  COZAAR 100 MG TABS 1 tab once daily  Medication #3:  PLAVIX 75 MG TABS 1 tab once daily  Medication #4:  Albuterol [facility] [phone]  Initial call taken by: Judie Grieve,  September 06, 2010 1:37 PM  Follow-up for Phone Call        pt states she has been on HCTZ for well over a year and that it is on her med list everytime she comes in. I filled that and added refills to the others since she will be coming back in a few mths and she knows to get them all filled when she is here. She also knows to contact PCP or pulmonary for her albuterol Follow-up by: Hardin Negus, RMA,  September 06, 2010 4:00 PM    New/Updated Medications: HYDROCHLOROTHIAZIDE 25 MG TABS (HYDROCHLOROTHIAZIDE) Take one tablet by mouth daily. Prescriptions: COZAAR 100 MG TABS (LOSARTAN POTASSIUM) 1 tab once daily  #30 x 4   Entered by:   Hardin Negus, RMA   Authorized by:   Verne Carrow, MD   Signed by:   Hardin Negus, RMA on 09/06/2010   Method used:   Electronically to        [facility] (retail)       [address]       [place], [place]  [address]       Ph: [phone]       Fax: [phone]   RxID:   [id number] PLAVIX 75 MG TABS (CLOPIDOGREL BISULFATE) 1 tab once daily  #30 x 4   Entered by:   Hardin Negus, RMA   Authorized by:   Verne Carrow, MD   Signed by:   Hardin Negus, RMA on 09/06/2010   Method used:   Electronically to        [facility] (retail)       [address]       [place], [place]  [address]       Ph: [phone]       Fax: [phone]   RxID:   [id number] HYDROCHLOROTHIAZIDE 25 MG TABS (HYDROCHLOROTHIAZIDE) Take one tablet by mouth daily.  #30 x 4   Entered by:   Hardin Negus,  RMA   Authorized by:   Verne Carrow, MD   Signed by:   Hardin Negus, RMA on 09/06/2010   Method used:   Electronically to        [facility] (retail)       [address]       [place], [place]  [address]       Ph: [phone]       Fax: [phone]   RxID:   [id number]

## 2010-09-26 LAB — BASIC METABOLIC PANEL
CO2: 22 mEq/L (ref 19–32)
GFR calc non Af Amer: >60 mL/min (ref >60)
Glucose, Bld: 83 mg/dL (ref 70–99)
Potassium: 3.1 mEq/L — ABNORMAL LOW (ref 3.5–5.1)
Sodium: 137 mEq/L (ref 135–145)

## 2010-09-26 LAB — BLOOD GAS, ARTERIAL
Acid-Base Excess: 10.2 mmol/L — ABNORMAL HIGH (ref 0.0–2.0)
Bicarbonate: 17.2 mEq/L — ABNORMAL LOW (ref 20.0–24.0)
Delivery systems: POSITIVE
FIO2: 100 %
O2 Content: 50 L/min
Patient temperature: 37
Patient temperature: 37
RATE: 14 resp/min
TCO2: 15.7 mmol/L (ref 0–100)
pCO2 arterial: 41.9 mmHg (ref 35.0–45.0)
pO2, Arterial: 167 mmHg — ABNORMAL HIGH (ref 80.0–100.0)
pO2, Arterial: 87.8 mmHg (ref 80.0–100.0)

## 2010-09-26 LAB — COMPREHENSIVE METABOLIC PANEL WITH GFR
ALT: 212 U/L — ABNORMAL HIGH (ref 0–35)
Albumin: 2.5 g/dL — ABNORMAL LOW (ref 3.5–5.2)
Alkaline Phosphatase: 58 U/L (ref 39–117)
Chloride: 111 meq/L (ref 96–112)
Glucose, Bld: 102 mg/dL — ABNORMAL HIGH (ref 70–99)
Potassium: 3.5 meq/L (ref 3.5–5.1)
Sodium: 136 meq/L (ref 135–145)
Total Bilirubin: 0.3 mg/dL (ref 0.3–1.2)
Total Protein: 5.1 g/dL — ABNORMAL LOW (ref 6.0–8.3)

## 2010-09-26 LAB — CBC
HCT: 34.3 % — ABNORMAL LOW (ref 36.0–46.0)
HCT: 35.5 % — ABNORMAL LOW (ref 36.0–46.0)
Hemoglobin: 12 g/dL (ref 12.0–15.0)
Hemoglobin: 12.2 g/dL (ref 12.0–15.0)
Hemoglobin: 12.5 g/dL (ref 12.0–15.0)
Hemoglobin: 13.1 g/dL (ref 12.0–15.0)
Hemoglobin: 15.1 g/dL — ABNORMAL HIGH (ref 12.0–15.0)
MCHC: 34 g/dL (ref 30.0–36.0)
MCHC: 34.9 g/dL (ref 30.0–36.0)
MCV: 96.2 fL (ref 78.0–100.0)
MCV: 97.2 fL (ref 78.0–100.0)
Platelets: 128 K/uL — ABNORMAL LOW (ref 150–400)
Platelets: 256 10*3/uL (ref 150–400)
RBC: 3.53 MIL/uL — ABNORMAL LOW (ref 3.87–5.11)
RBC: 3.61 MIL/uL — ABNORMAL LOW (ref 3.87–5.11)
RBC: 3.97 MIL/uL (ref 3.87–5.11)
RBC: 4.68 MIL/uL (ref 3.87–5.11)
RDW: 12.9 % (ref 11.5–15.5)
RDW: 13.3 % (ref 11.5–15.5)
RDW: 13.4 % (ref 11.5–15.5)
WBC: 12.9 10*3/uL — ABNORMAL HIGH (ref 4.0–10.5)
WBC: 16.2 K/uL — ABNORMAL HIGH (ref 4.0–10.5)
WBC: 19 10*3/uL — ABNORMAL HIGH (ref 4.0–10.5)

## 2010-09-26 LAB — POCT I-STAT 3, ART BLOOD GAS (G3+)
Acid-base deficit: 6 mmol/L — ABNORMAL HIGH (ref 0.0–2.0)
Acid-base deficit: 8 mmol/L — ABNORMAL HIGH (ref 0.0–2.0)
Bicarbonate: 20.5 mEq/L (ref 20.0–24.0)
O2 Saturation: 94 %
O2 Saturation: 94 %
Patient temperature: 98.8
pCO2 arterial: 40.9 mmHg (ref 35.0–45.0)
pH, Arterial: 7.306 — ABNORMAL LOW (ref 7.350–7.400)
pO2, Arterial: 290 mmHg — ABNORMAL HIGH (ref 80.0–100.0)

## 2010-09-26 LAB — COMPREHENSIVE METABOLIC PANEL
ALT: 23 U/L (ref 0–35)
ALT: 231 U/L — ABNORMAL HIGH (ref 0–35)
ALT: 79 U/L — ABNORMAL HIGH (ref 0–35)
AST: 143 U/L — ABNORMAL HIGH (ref 0–37)
AST: 202 U/L — ABNORMAL HIGH (ref 0–37)
AST: 36 U/L (ref 0–37)
Albumin: 2.6 g/dL — ABNORMAL LOW (ref 3.5–5.2)
Alkaline Phosphatase: 61 U/L (ref 39–117)
Alkaline Phosphatase: 69 U/L (ref 39–117)
Alkaline Phosphatase: 71 U/L (ref 39–117)
Alkaline Phosphatase: 72 U/L (ref 39–117)
BUN: 15 mg/dL (ref 6–23)
BUN: 3 mg/dL — ABNORMAL LOW (ref 6–23)
BUN: 9 mg/dL (ref 6–23)
BUN: 9 mg/dL (ref 6–23)
CO2: 20 mEq/L (ref 19–32)
CO2: 23 mEq/L (ref 19–32)
Calcium: 7.8 mg/dL — ABNORMAL LOW (ref 8.4–10.5)
Chloride: 102 mEq/L (ref 96–112)
Chloride: 104 mEq/L (ref 96–112)
Creatinine, Ser: 0.68 mg/dL (ref 0.4–1.2)
Creatinine, Ser: 0.91 mg/dL (ref 0.4–1.2)
Creatinine, Ser: 1.01 mg/dL (ref 0.4–1.2)
GFR calc Af Amer: >60 mL/min (ref >60)
GFR calc Af Amer: >60 mL/min (ref >60)
GFR calc Af Amer: >60 mL/min (ref >60)
GFR calc non Af Amer: 57 mL/min — ABNORMAL LOW (ref >60)
GFR calc non Af Amer: >60 mL/min (ref >60)
GFR calc non Af Amer: >60 mL/min (ref >60)
GFR calc non Af Amer: >60 mL/min (ref >60)
Glucose, Bld: 102 mg/dL — ABNORMAL HIGH (ref 70–99)
Glucose, Bld: 127 mg/dL — ABNORMAL HIGH (ref 70–99)
Glucose, Bld: 90 mg/dL (ref 70–99)
Potassium: 2.6 mEq/L — CL (ref 3.5–5.1)
Potassium: 2.8 mEq/L — ABNORMAL LOW (ref 3.5–5.1)
Potassium: 3.3 mEq/L — ABNORMAL LOW (ref 3.5–5.1)
Sodium: 134 mEq/L — ABNORMAL LOW (ref 135–145)
Total Bilirubin: 0.5 mg/dL (ref 0.3–1.2)
Total Bilirubin: 0.6 mg/dL (ref 0.3–1.2)
Total Bilirubin: 1.2 mg/dL (ref 0.3–1.2)
Total Protein: 5.2 g/dL — ABNORMAL LOW (ref 6.0–8.3)
Total Protein: 5.8 g/dL — ABNORMAL LOW (ref 6.0–8.3)
Total Protein: 6.5 g/dL (ref 6.0–8.3)

## 2010-09-26 LAB — CARDIAC PANEL(CRET KIN+CKTOT+MB+TROPI)
CK, MB: 25.4 ng/mL (ref 0.3–4.0)
Total CK: 325 U/L — ABNORMAL HIGH (ref 7–177)
Total CK: 342 U/L — ABNORMAL HIGH (ref 7–177)
Total CK: 350 U/L — ABNORMAL HIGH (ref 7–177)
Troponin I: 2.25 ng/mL (ref 0.00–0.06)

## 2010-09-26 LAB — CORTISOL: Cortisol, Plasma: 28.3 ug/dL

## 2010-09-26 LAB — DIFFERENTIAL
Basophils Absolute: 0 10*3/uL (ref 0.0–0.1)
Basophils Relative: 0 % (ref 0–1)
Eosinophils Absolute: 0.1 10*3/uL (ref 0.0–0.7)
Eosinophils Relative: 0 % (ref 0–5)
Lymphocytes Relative: 12 % (ref 12–46)
Monocytes Absolute: 0.2 10*3/uL (ref 0.1–1.0)

## 2010-09-26 LAB — URINALYSIS, ROUTINE W REFLEX MICROSCOPIC
Hgb urine dipstick: NEGATIVE
Leukocytes, UA: NEGATIVE
Nitrite: NEGATIVE
Protein, ur: 30 mg/dL — AB
Specific Gravity, Urine: 1.025 (ref 1.005–1.030)
Urobilinogen, UA: 0.2 mg/dL (ref 0.0–1.0)

## 2010-09-26 LAB — CULTURE, BLOOD (ROUTINE X 2): Culture: NO GROWTH

## 2010-09-26 LAB — URINE MICROSCOPIC-ADD ON

## 2010-09-26 LAB — MAGNESIUM: Magnesium: 1.9 mg/dL (ref 1.5–2.5)

## 2010-09-26 LAB — LIPID PANEL
Cholesterol: 110 mg/dL (ref 0–200)
HDL: 31 mg/dL — ABNORMAL LOW (ref >39)
LDL Cholesterol: 61 mg/dL (ref 0–99)
Triglycerides: 91 mg/dL (ref <150)

## 2010-09-26 LAB — CULTURE, BAL-QUANTITATIVE W GRAM STAIN

## 2010-09-26 LAB — HEMOGLOBIN A1C: Hgb A1c MFr Bld: 6 % (ref 4.6–6.1)

## 2010-09-26 LAB — MRSA PCR SCREENING: MRSA by PCR: NEGATIVE

## 2010-09-26 LAB — HEPATITIS PANEL, ACUTE: Hep B C IgM: NEGATIVE

## 2010-09-26 LAB — PROTIME-INR: Prothrombin Time: 16.2 seconds — ABNORMAL HIGH (ref 11.6–15.2)

## 2010-09-26 LAB — PHOSPHORUS: Phosphorus: 1.8 mg/dL — ABNORMAL LOW (ref 2.3–4.6)

## 2010-10-02 ENCOUNTER — Encounter: Payer: Self-pay | Admitting: Cardiovascular Disease

## 2010-10-03 ENCOUNTER — Ambulatory Visit: Payer: Self-pay | Admitting: Cardiovascular Disease

## 2010-10-15 ENCOUNTER — Ambulatory Visit (INDEPENDENT_AMBULATORY_CARE_PROVIDER_SITE_OTHER): Payer: Medicaid Other | Admitting: Cardiovascular Disease

## 2010-10-15 ENCOUNTER — Encounter: Payer: Self-pay | Admitting: Cardiovascular Disease

## 2010-10-15 ENCOUNTER — Encounter: Payer: Self-pay | Admitting: Cardiology

## 2010-10-15 ENCOUNTER — Telehealth: Payer: Self-pay | Admitting: Cardiology

## 2010-10-15 VITALS — BP 150/80 | HR 72 | Resp 18 | Ht 67.0 in | Wt 188.1 lb

## 2010-10-15 DIAGNOSIS — I739 Peripheral vascular disease, unspecified: Secondary | ICD-10-CM

## 2010-10-15 DIAGNOSIS — E876 Hypokalemia: Secondary | ICD-10-CM

## 2010-10-15 LAB — CBC WITH DIFFERENTIAL/PLATELET
Basophils Absolute: 0 10*3/uL (ref 0.0–0.1)
Lymphocytes Relative: 30.8 % (ref 12.0–46.0)
Monocytes Relative: 6.5 % (ref 3.0–12.0)
Neutrophils Relative %: 60.5 % (ref 43.0–77.0)
Platelets: 362 10*3/uL (ref 150.0–400.0)
RDW: 13.7 % (ref 11.5–14.6)

## 2010-10-15 LAB — BASIC METABOLIC PANEL
CO2: 30 mEq/L (ref 19–32)
Chloride: 99 mEq/L (ref 96–112)
Sodium: 138 mEq/L (ref 135–145)

## 2010-10-15 LAB — PROTIME-INR
INR: 1.1 ratio — ABNORMAL HIGH (ref 0.8–1.0)
Prothrombin Time: 12 s (ref 10.2–12.4)

## 2010-10-15 MED ORDER — POTASSIUM CHLORIDE CRYS ER 20 MEQ PO TBCR: 20.0000 meq | EXTENDED_RELEASE_TABLET | Freq: Every day | ORAL | Status: DC

## 2010-10-15 NOTE — Telephone Encounter (Signed)
Patient aware of lab results. She will take of potassium today and then 20 daily. Her potassium is 3.2.

## 2010-10-15 NOTE — Assessment & Plan Note (Signed)
She is known to have severe PAD with atherectomy of the left SFA ostium and proximal portion of the SFA in December 2011.Her right profunda has tight ostial disease which we did not approach.  Her pain in the left leg has been progressively worsening over the last 3 months. I suspect the disease in the left SFA has progressed. I will arrange a distal aortogram with bilateral LE runoff for Wednesday April 18th at [facility]. Risks and benefits reviewed with pt. Will get BMET, CBC and coags today.

## 2010-10-15 NOTE — Progress Notes (Signed)
History of Present Illness:[name] with history of HTN, hyperlipidemia, CAD admitted with NSTEMI, respiratory failure February 2011, PVD here today for PV follow up. She underwent cardiac cath showing severe stenosis of the Circumflex, now s/p DES to the Circumflex. Her EF was found to be 30%, presumed to be non-ischemic in etiology. Echo with EF of 40-45%. She was discharged home on a beta blocker, statin, ASA and ARB. Her femoral angiogram showed a  stenosis right SFA. Non-invasive studies in April with right ABI 0.93, Left ABI 0.70. There was suggestion of moderate disease in the left SFA ostium and moderate disease in the right SFA ostium. She had developed pain in both legs with walking 100 yards. This was described as cramping both calf muscles. Resolved with rest. No rest pain or ulcerations. I arranged a distal aortogram with bilateral lower extremity runoff on 05/08/10. She was found to have a a severe stenosis at the ostium of the left SFA and moderate stenosis at the ostium of the right SFA. The ostium of the right profunda femoral artery had a severe stenosis. Silverhawk atherectomy of the left SFA ostium was performed on 06/12/10. She is here today for follow up. Her left leg has been extremely painful when walking over the last three months. She has no rest pain or ulcerations. She can only walk for 20 feet before her leg begins to ache. She did not have a significant improvement in f/u ABI after the atherectomy in December. She also has pain in the right leg but not as severe as the left leg. The vessels below the knee are diffusely diseased.    Past Medical History  Diagnosis Date  . Hypertension   . COPD (chronic obstructive pulmonary disease)   . Tobacco abuse     hx of  . Coronary artery disease     PCI, DES circumflex 2/11. moderate disease LAD  . Hyperlipidemia   . PVD (peripheral vascular disease)     lower extremity runoff 05/08/10    Past Surgical History  Procedure Date  .  Tonsillectomy     Current Outpatient Prescriptions  Medication Sig Dispense Refill  . DISCONTD: aspirin 325 MG tablet Take 325 mg by mouth daily.        . clopidogrel (PLAVIX) 75 MG tablet Take 75 mg by mouth daily.        . hydrochlorothiazide 25 MG tablet Take 25 mg by mouth daily.        Marland Kitchen losartan (COZAAR) 100 MG tablet Take 100 mg by mouth daily.        . metoprolol (LOPRESSOR) 50 MG tablet Take 50 mg by mouth 2 (two) times daily.        Marland Kitchen PARoxetine (PAXIL) 40 MG tablet Take 40 mg by mouth every morning.        . simvastatin (ZOCOR) 20 MG tablet Take 20 mg by mouth at bedtime.          Allergies  Allergen Reactions  . Ace Inhibitors   . Codeine   . EAV:[id number]+GNFAOZHYQ+MVHQIONGEX Acid+Aspartame     History   Social History  . Marital Status: Married    Spouse Name: N/A    Number of Children: 3  . Years of Education: N/A   Occupational History  . sculptor    Social History Main Topics  . Smoking status: Former Smoker    Types: Cigarettes  . Smokeless tobacco: Not on file   Comment: she has smoker 1-2 packs daily  since the age of 34. 80 pack years  . Alcohol Use: No  . Drug Use: No  . Sexually Active: Not on file   Other Topics Concern  . Not on file   Social History Narrative  . No narrative on file    Family History  Problem Relation Age of Onset  . Coronary artery disease Mother     deceased. coronary artery bypass graft  . Cirrhosis Father     deceased  . Other Brother     alive, unknown hx    Review of Systems:  As stated in the HPI and otherwise negative.   BP 150/80  Pulse 72  Resp 18  Ht 5\' 7"  (1.702 m)  Wt 188 lb 1.9 oz (85.331 kg)  BMI 29.46 kg/m2  Physical Examination: General: Well developed, well nourished, NAD Neuro:nonfocal Skin: no rashes Musculoskeletal: Muscle strength 5/5 all ext Psychiatric: Mood and affect normal Neck: No JVD, no carotid bruits, no thyromegaly, no lymphadenopathy. Lungs:Clear bilaterally, no  wheezes, rhonci, crackles Cardiovascular: Regular rate and rhythm. No murmurs, gallops or rubs. Abdomen:Soft. Bowel sounds present. Non-tender.  Extremities: No lower extremity edema. Pulses non-palpable bilateral DP/PT.

## 2010-10-15 NOTE — Patient Instructions (Signed)
Your physician recommends that you schedule a follow-up appointment. To be determined after your PV procedure. Your physician has requested that you have a peripheral vascular angiogram. This exam is performed at the hospital. During this exam IV contrast is used to look at arterial blood flow. Please review the information sheet given for details.

## 2010-10-23 ENCOUNTER — Ambulatory Visit (HOSPITAL_COMMUNITY)
Admission: RE | Admit: 2010-10-23 | Discharge: 2010-10-23 | Disposition: A | Discharge status: Home / Self Care | Payer: Medicaid Other | Source: Ambulatory Visit | Attending: Cardiovascular Disease | Admitting: Cardiovascular Disease

## 2010-10-23 DIAGNOSIS — I70219 Atherosclerosis of native arteries of extremities with intermittent claudication, unspecified extremity: Secondary | ICD-10-CM

## 2010-10-23 DIAGNOSIS — I700 Atherosclerosis of aorta: Secondary | ICD-10-CM | POA: Insufficient documentation

## 2010-10-23 LAB — BASIC METABOLIC PANEL
Chloride: 104 mEq/L (ref 96–112)
Creatinine, Ser: 0.5 mg/dL (ref 0.4–1.2)
GFR calc Af Amer: >60 mL/min (ref >60)
GFR calc non Af Amer: >60 mL/min (ref >60)
Potassium: 3.5 mEq/L (ref 3.5–5.1)

## 2010-10-24 ENCOUNTER — Telehealth: Payer: Self-pay | Admitting: Cardiology

## 2010-10-24 NOTE — Telephone Encounter (Signed)
Will put in an order to see Dr. Cory Roughen with [facility].

## 2010-10-25 NOTE — Procedures (Signed)
Allison [name], Allison [name] NO.:  [id number]  MEDICAL RECORD NO.:  [id number]           PATIENT TYPE:  O  LOCATION:  [facility]                         FACILITY:  [facility]  PHYSICIAN:  Verne Carrow, MDDATE OF BIRTH:  Dec 05, 1950  DATE OF PROCEDURE:  10/23/2010 DATE OF DISCHARGE:  10/23/2010                   PERIPHERAL VASCULAR INVASIVE PROCEDURE   PROCEDURE PERFORMED:  Distal aortogram with bilateral lower extremity runoff.  OPERATOR:  Verne Carrow, MD  INDICATIONS:  This is a 60 year old Caucasian female with a history of peripheral arterial disease who underwent atherectomy in the proximal portion of the left superficial femoral artery in December 2011.  The patient has recently had severe worsening of the pain in her left lower extremity with walking.  Noninvasive studies suggested restenosis of the left superficial femoral artery.  Diagnostic procedure was planned for today.  DETAILS OF PROCEDURE:  The patient was brought to the Peripheral Vascular Laboratory after signing informed consent for the procedure. The right groin was prepped and draped in a sterile fashion.  Lidocaine 1% was used for local anesthesia.  We had much difficulty obtaining arterial access.  Ultimately, we are able to gain access into the very small heavily-diseased right femoral artery with a micropuncture needle. A pigtail catheter was advanced over a wire into the distal aorta.  Her initial angiogram was performed with the pigtail catheter just above the takeoff of the bilateral renal arteries.  I then pulled pigtail catheter down to the level the aortic bifurcation and took several angulated shots of the iliac arteries.  We then performed a runoff to both lower extremities.  The patient tolerated the procedure well.  She was taken to the recovery room in stable condition.  HEMODYNAMIC FINDINGS:  Central aortic pressure 120/60.  ANGIOGRAPHIC FINDINGS: 1. The right renal  arteries are both patent. 2. The left renal artery is a single vessel and is patent. 3. There is diffuse nonobstructive plaque throughout the distal aorta. 4. The left common iliac artery has a 40% plaque.  The left external     iliac artery has diffuse plaque.  The left internal iliac artery     has diffuse plaque.  The left superficial femoral artery is almost     completely occluded starting from the ostium down to the distal     vessel.  There appears to be diffuse 99% stenosis throughout with a     very poor flow.  There is reconstitution of the vessel in the     distal portion of the left superficial femoral artery.  The left     popliteal artery is free of any significant disease.  There appears     to be three-vessel runoff to the left foot. 5. The right common iliac artery has diffuse 30% stenosis.  The left     external iliac artery, internal iliac artery have diffuse disease.     The left external iliac artery has several areas of 80% stenosis.     Overall, this is a very small-caliber diffusely diseased vessel.     The right internal iliac artery has focal 80% stenosis.  The right  common femoral artery has diffuse 50% stenosis.  The right     superficial femoral artery has an ostial 60% stenosis and mid 50%     stenosis.  The right popliteal artery is patent.  There appears to     be three-vessel runoff to the right foot.  The right profunda     femoral artery has a 99% ostial stenosis.  IMPRESSION:  Severe peripheral arterial disease with severe claudication.  RECOMMENDATIONS:  Based on the patient's current angiograms, I do not think that percutaneous therapy would be a good option.  I will plan on letting her go home today, and we will refer to see one of our Surgery colleagues for potential aortobifemoral bypass with possible bypass to the left popliteal artery as well.     Verne Carrow, MD     CM/MEDQ  D:  10/23/2010  T:  10/24/2010  Job:   [id number]  Electronically Signed by Verne Carrow MD on 10/25/2010 05:56:04 AM

## 2010-11-04 ENCOUNTER — Encounter (INDEPENDENT_AMBULATORY_CARE_PROVIDER_SITE_OTHER): Payer: Medicaid Other

## 2010-11-04 ENCOUNTER — Encounter (INDEPENDENT_AMBULATORY_CARE_PROVIDER_SITE_OTHER): Payer: Medicaid Other | Admitting: Surgery

## 2010-11-04 DIAGNOSIS — I7092 Chronic total occlusion of artery of the extremities: Secondary | ICD-10-CM

## 2010-11-04 DIAGNOSIS — Z01810 Encounter for preprocedural cardiovascular examination: Secondary | ICD-10-CM

## 2010-11-05 HISTORY — PX: FEMORAL BYPASS: SHX50

## 2010-11-05 NOTE — Assessment & Plan Note (Signed)
OFFICE VISIT  [name], [name] DOB:  [birth year]                                       11/04/2010 ZOXWR#:[id number]  REASON FOR VISIT:  Bilateral claudication, left greater than right.  HISTORY:  This is a very pleasant 60 year old female I am seeing at the request of Dr. Clifton [name] for bilateral claudication, left greater than right.  The patient has been struggling with lower extremity insufficiency for quite some time.  She has undergone atherectomy of the left superficial femoral ostium and proximal superficial femoral artery in December of 2011.  She has known profunda disease in the right.  She recently underwent an arteriogram which showed high-grade right external iliac stenosis and right profunda femoral stenosis with an occluded left superficial femoral artery.  She is referred today for surgical evaluation.  The patient states that she can walk approximately 100 feet before her legs give out and she gets cramps.  This has been getting worse for the past several months.  She denies having any significant rest pain although she does have some pain at night.  She is not having any ulcers.  The patient has coronary artery disease.  She had a heart attack in February of 2011 and subsequent stenting.  She suffers from hypertension, hypercholesterolemia which are medically managed.  She has history of tobacco abuse but quit in 2011, at the time of her heart attack.  She denies symptoms of TIA.  She denies chest pain.  REVIEW OF SYSTEMS:  GENERAL:  Positive for weight gain. VASCULAR:  Positive for pain in legs when walking, when lying flat. PSYCHIATRIC:  Positive for anxiety. All other review of systems are negative.  PAST MEDICAL HISTORY:  Hypertension, hypercholesterolemia, coronary artery disease, peripheral vascular disease.  PAST SURGICAL HISTORY:  Coronary stent and tonsillectomy.  SOCIAL HISTORY:  She is married with 3 children.  Does not  smoke, quit in February of 2011.  Does not drink.  FAMILY HISTORY:  Positive for coronary artery disease and peripheral vascular disease in her mother.  MEDICATIONS:  Include, losartan, metoprolol, hydrochlorothiazide, Plavix, aspirin, albuterol, potassium.  ALLERGIES:  Augmentin, codeine and ACE inhibitors.  PHYSICAL EXAMINATION:  Vital signs:  Heart rate 67, blood pressure 147/81, O2 sats 100.  General:  She is well-appearing, in no distress. HEENT:  Within normal limits.  Lungs:  Clear bilaterally.  No wheezes or rhonchi.  Cardiovascular:  Regular rate and rhythm.  No carotid bruits. Pedal pulses are not palpable.  Faint left femoral pulse.  No right femoral pulse palpable.  Abdomen:  Obese but soft.  No hernia. Musculoskeletal:  No major deformities.  No cyanosis.  Neurological: She is intact.  Skin:  Without rash.  DIAGNOSTIC STUDIES:  I reviewed her aortogram with runoff.  This shows left superficial femoral occlusion and right external iliac and profunda femoral disease.  She was vein mapped today.  She has suboptimal vein on the left, largest diameter is 0.26.  ASSESSMENT AND PLAN:  Bilateral claudication, left great right.  I had a long discussion today regarding the patient's disease pattern and the appropriate order in which to address these.  On her arteriogram I do not see any hemodynamically significant lesions within the left common and internal iliac arterial system.  Since her left leg is by far the most symptomatic leg I have recommended we proceed with left femoral  above knee popliteal artery bypass graft.  She has not complained of whole lot about her right leg therefore we will continue to medically manage her right leg.  Should she need a right leg intervention this would most likely be an aortobifemoral bypass graft.  I would like to avoid going back into the left groin but I do not think she would receive any benefit from an aortobifemoral on her left leg  and since this is her symptomatic side this should be addressed first.  I spoke with Dr. Clifton [name].  It is safe to stop her Plavix.  She had a stent placed over a year ago.  I discussed proceeding with left femoral to above knee popliteal bypass graft with Gore-Tex with the patient.  She has been scheduled for Thursday May 10.  The risks and benefits were discussed with the patient including the risk of bleeding, the risk of cardiopulmonary complications, the risk of infection and the need for prolonged surveillance.  All of her questions were answered today.  I am going to bring her back for a carotid duplex as I cannot find the carotid ultrasound for preoperative assessment.    Allison Ny, MD Electronically Signed  VWB/MEDQ  D:  11/04/2010  T:  11/05/2010  Job:  [id number]  cc:   Allison Carrow, MD Dr Allison [name]

## 2010-11-07 ENCOUNTER — Other Ambulatory Visit (INDEPENDENT_AMBULATORY_CARE_PROVIDER_SITE_OTHER): Payer: Medicaid Other

## 2010-11-07 DIAGNOSIS — I70219 Atherosclerosis of native arteries of extremities with intermittent claudication, unspecified extremity: Secondary | ICD-10-CM

## 2010-11-07 DIAGNOSIS — Z01810 Encounter for preprocedural cardiovascular examination: Secondary | ICD-10-CM

## 2010-11-12 ENCOUNTER — Ambulatory Visit (HOSPITAL_COMMUNITY)
Admission: RE | Admit: 2010-11-12 | Discharge: 2010-11-12 | Disposition: A | Discharge status: Home / Self Care | Payer: Medicaid Other | Source: Ambulatory Visit | Attending: Surgery | Admitting: Surgery

## 2010-11-12 ENCOUNTER — Other Ambulatory Visit: Payer: Self-pay | Admitting: Surgery

## 2010-11-12 ENCOUNTER — Telehealth: Payer: Self-pay | Admitting: Cardiovascular Disease

## 2010-11-12 ENCOUNTER — Encounter (HOSPITAL_COMMUNITY)
Admission: RE | Admit: 2010-11-12 | Discharge: 2010-11-12 | Disposition: A | Discharge status: Home / Self Care | Payer: Medicaid Other | Source: Ambulatory Visit | Attending: Surgery | Admitting: Surgery

## 2010-11-12 DIAGNOSIS — J449 Chronic obstructive pulmonary disease, unspecified: Secondary | ICD-10-CM | POA: Insufficient documentation

## 2010-11-12 DIAGNOSIS — Z01818 Encounter for other preprocedural examination: Secondary | ICD-10-CM | POA: Insufficient documentation

## 2010-11-12 DIAGNOSIS — I739 Peripheral vascular disease, unspecified: Secondary | ICD-10-CM

## 2010-11-12 DIAGNOSIS — Z01810 Encounter for preprocedural cardiovascular examination: Secondary | ICD-10-CM | POA: Insufficient documentation

## 2010-11-12 DIAGNOSIS — Z01812 Encounter for preprocedural laboratory examination: Secondary | ICD-10-CM | POA: Insufficient documentation

## 2010-11-12 DIAGNOSIS — Z87891 Personal history of nicotine dependence: Secondary | ICD-10-CM | POA: Insufficient documentation

## 2010-11-12 DIAGNOSIS — J4489 Other specified chronic obstructive pulmonary disease: Secondary | ICD-10-CM | POA: Insufficient documentation

## 2010-11-12 LAB — ABO/RH: ABO/RH(D): O POS

## 2010-11-12 LAB — CBC
MCH: 31.5 pg (ref 26.0–34.0)
MCV: 90.3 fL (ref 78.0–100.0)
Platelets: 327 10*3/uL (ref 150–400)
RBC: 4.63 MIL/uL (ref 3.87–5.11)

## 2010-11-12 LAB — COMPREHENSIVE METABOLIC PANEL
AST: 19 U/L (ref 0–37)
Albumin: 3.8 g/dL (ref 3.5–5.2)
Calcium: 10 mg/dL (ref 8.4–10.5)
Chloride: 98 mEq/L (ref 96–112)
Creatinine, Ser: 0.49 mg/dL (ref 0.4–1.2)
GFR calc Af Amer: >60 mL/min (ref >60)
Total Protein: 7.2 g/dL (ref 6.0–8.3)

## 2010-11-12 LAB — URINALYSIS, ROUTINE W REFLEX MICROSCOPIC
Glucose, UA: NEGATIVE mg/dL
Ketones, ur: NEGATIVE mg/dL
pH: 8 (ref 5.0–8.0)

## 2010-11-12 LAB — URINE MICROSCOPIC-ADD ON

## 2010-11-12 LAB — DIFFERENTIAL
Eosinophils Absolute: 0.1 10*3/uL (ref 0.0–0.7)
Eosinophils Relative: 1 % (ref 0–5)
Lymphs Abs: 2.2 10*3/uL (ref 0.7–4.0)
Monocytes Relative: 6 % (ref 3–12)
Neutrophils Relative %: 64 % (ref 43–77)

## 2010-11-12 LAB — SURGICAL PCR SCREEN: Staphylococcus aureus: NEGATIVE

## 2010-11-12 LAB — TYPE AND SCREEN

## 2010-11-12 NOTE — Procedures (Unsigned)
VASCULAR LAB EXAM  INDICATION:  Preop vein mapping for planned left fem-pop graft.  HISTORY: Diabetes: Cardiac: Hypertension:  EXAM:  Left greater saphenous mapping.  Please see worksheet for details.  IMPRESSION:  Patent and compressible left greater saphenous vein with calibers ranging from 0.78 cm at the origin to 0.11 cm.  ___________________________________________ V. Charlena Cross, MD  LT/MEDQ  D:  11/04/2010  T:  11/04/2010  Job:  [id number]

## 2010-11-12 NOTE — Procedures (Unsigned)
CAROTID DUPLEX EXAM  INDICATION:  Preop.  HISTORY: Diabetes:  No. Cardiac:  MI. Hypertension:  Yes. Smoking:  Previous. Previous Surgery:  No. CV History:  Patient is currently asymptomatic. Amaurosis Fugax No, Paresthesias No, Hemiparesis No.                                      RIGHT             LEFT Brachial systolic pressure:         147               149 Brachial Doppler waveforms:         WNL               WNL Vertebral direction of flow:        Antegrade         Antegrade DUPLEX VELOCITIES (cm/sec) CCA peak systolic                   67                137 ECA peak systolic                   136               106 ICA peak systolic                   129               96 ICA end diastolic                   46                32 PLAQUE MORPHOLOGY:                  Heterogenous      Heterogenous PLAQUE AMOUNT:                      Mild-to-moderate  Mild-to-moderate PLAQUE LOCATION:  IMPRESSION: 1. Right-sided 40% to 59% stenosis within the internal carotid artery. 2. Left side, no hemodynamically significant stenosis. 3. Bilateral intimal thickening within the common carotid arteries.  ___________________________________________ V. Charlena Cross, MD  OD/MEDQ  D:  11/07/2010  T:  11/08/2010  Job:  [id number]

## 2010-11-12 NOTE — Telephone Encounter (Signed)
LOV,12 faxed to [facility] @ [phone]  11/12/10/km

## 2010-11-14 ENCOUNTER — Inpatient Hospital Stay (HOSPITAL_COMMUNITY)
Admission: RE | Admit: 2010-11-14 | Discharge: 2010-11-16 | DRG: 254 | Disposition: A | Discharge status: Home / Self Care | Payer: Medicaid Other | Source: Ambulatory Visit | Attending: Surgery | Admitting: Surgery

## 2010-11-14 DIAGNOSIS — Z87891 Personal history of nicotine dependence: Secondary | ICD-10-CM

## 2010-11-14 DIAGNOSIS — I70219 Atherosclerosis of native arteries of extremities with intermittent claudication, unspecified extremity: Secondary | ICD-10-CM

## 2010-11-14 DIAGNOSIS — J449 Chronic obstructive pulmonary disease, unspecified: Secondary | ICD-10-CM | POA: Diagnosis present

## 2010-11-14 DIAGNOSIS — J4489 Other specified chronic obstructive pulmonary disease: Secondary | ICD-10-CM | POA: Diagnosis present

## 2010-11-14 DIAGNOSIS — I252 Old myocardial infarction: Secondary | ICD-10-CM

## 2010-11-14 LAB — URINALYSIS, ROUTINE W REFLEX MICROSCOPIC
Bilirubin Urine: NEGATIVE
Ketones, ur: 15 mg/dL — AB
Nitrite: NEGATIVE
Protein, ur: 30 mg/dL — AB

## 2010-11-14 LAB — URINE MICROSCOPIC-ADD ON

## 2010-11-15 LAB — CBC
Hemoglobin: 10.6 g/dL — ABNORMAL LOW (ref 12.0–15.0)
MCH: 31.5 pg (ref 26.0–34.0)
MCV: 89.9 fL (ref 78.0–100.0)
RBC: 3.36 MIL/uL — ABNORMAL LOW (ref 3.87–5.11)
WBC: 9 10*3/uL (ref 4.0–10.5)

## 2010-11-15 LAB — BASIC METABOLIC PANEL
CO2: 26 mEq/L (ref 19–32)
Chloride: 102 mEq/L (ref 96–112)
Creatinine, Ser: <0.47 mg/dL (ref 0.4–1.2)
Potassium: 3.3 mEq/L — ABNORMAL LOW (ref 3.5–5.1)

## 2010-11-18 NOTE — Op Note (Signed)
NAMEDANAHI, REDDISH                  ACCOUNT NO.:  [id number]  MEDICAL RECORD NO.:  [id number]           PATIENT TYPE:  I  LOCATION:  3311                         FACILITY:  [facility]  PHYSICIAN:  Juleen China IV, MDDATE OF BIRTH:  [birth year]  DATE OF PROCEDURE:  11/14/2010 DATE OF DISCHARGE:                              OPERATIVE REPORT   PREOPERATIVE DIAGNOSIS:  Left leg claudication.  POSTOPERATIVE DIAGNOSIS:  Left leg claudication.  PROCEDURE PERFORMED:  Left common femoral to above-knee popliteal artery bypass graft with 6-mm ringed Propaten PTFE.  SURGEON: 1. Charlena Cross, MD  ASSISTANT:  Newton Pigg, PA  ANESTHESIA:  General.  BLOOD LOSS:  Minimal.  COMPLICATIONS:  None.  FINDINGS:  Small common femoral artery and superficial femoral artery. The proximal anastomosis was hooded onto the common femoral artery extending down to the profunda femoral artery.  Distal anastomosis was done with a tourniquet in place.  The vein was evaluated with ultrasound and found to be very small beginning in the upper thigh.  INDICATIONS:  This is a 60 year old female, I saw at the request of Dr. Clifton James for surgical revascularization.  She has previously undergone atherectomy in the left superficial femoral artery, which has failed. She comes in today for femoral-popliteal bypass.  PROCEDURE IN DETAIL:  The patient was identified in the holding area and taken to room #6, placed supine on the table.  General endotracheal anesthesia was administered.  The patient was prepped and draped in usual fashion.  A time-out was called.  Antibiotics were given.  A longitudinal incision was made over palpable pulse in the left groin. Cautery was used throughout the subcutaneous tissue.  The femoral sheath was identified and then opened sharply.  There are multiple branches of the greater saphenous vein, which were divided between silk ties.  The common femoral artery was then  exposed from the inguinal ligament down to the bifurcation.  The profunda femoral and superficial femoral artery were each individually isolated as were side branches.  The greater saphenous vein was rather large in the groin, and it was mobilized throughout the incision.  Next, attention was turned towards the above- knee popliteal artery.  I used an ultrasound to evaluate the greater saphenous vein that appeared to be very small and beginning in the mid thigh, which was consistent with the preoperative ultrasound findings. I did not think it was suitable for conduit.  I therefore made a medial incision above the knee.  Cautery was used to divide subcutaneous tissue.  The fascia was opened sharply.  The popliteal space was entered and I identified the above-knee popliteal artery and vein.  They were appropriately mobilized.  I then created a subsartorial tunnel between the two incisions.  The patient was then given systemic heparinization. After the heparin circulated, the common femoral artery, superficial femoral, and profunda femoral arteries were occluded with vascular clamps and a #11 blade was used to make an arteriotomy, which was extended longitudinally with Potts scissors, and then making approximately 2.5 cm arteriotomy, which extended down to the origin of the profunda femoral artery.  I selected  a 6 mm Gore-Tex ringed Propaten graft.  I beveled the graft to fit the size of the arteriotomy and then running anastomosis was created with running CV6 Gore suture.  Once the anastomosis was completed, appropriate flush maneuvers were performed with the egress through the graft.  The graft was then occluded and brought through the previously created tunnel making sure to maintain appropriate tension and orientation of the graft.  Next, a Webril was placed in the mid thigh.  The leg was exsanguinated and a tourniquet was inflated to 250 mm of pressure.  Next, the popliteal artery was  opened with a #11 blade.  The artery was approximately 3 mm in diameter, but soft without significant plaque.  A 2 cm longitudinal arteriotomy was made.  The leg was straightened and the graft was cut to the appropriate length.  It was then beveled to fit the size of the arteriotomy.  A running anastomosis was created with running CV6 Gore suture.  Prior to completion of the anastomosis, the tourniquet was taken down. Appropriate flush maneuvers were performed and the anastomosis was completed.  Handheld Doppler was used to evaluate the signal in the posterior tibial and anterior tibial artery.  These were biphasic with the graft opened and with the graft occluded.  They were nearly inaudible.  At this point, I was satisfied with the results.  The patient's heparin was reversed with 50 mg of protamine.  Once hemostasis was appropriate, the fascia and the above-knee incision was closed with 2-0 Vicryl.  The subcutaneous tissue was closed with 3-0 Vicryl.  The skin was closed with 4-0 Vicryl.  The groin was then copiously irrigated.  The femoral sheath was reapproximated with 2-0 Vicryl. Subcutaneous tissue was closed with two layers of 3-0 Vicryl and the skin was closed with 4-0 Vicryl.  The patient tolerated the procedure well.  Dermabond was placed on the wounds.  The patient was successfully extubated and taken to recovery room in stable condition.     [name], MD     VWB/MEDQ  D:  11/15/2010  T:  11/15/2010  Job:  [id number]  Electronically Signed by Arelia Longest IV MD on 11/18/2010 11:00:10 PM

## 2010-11-19 NOTE — Procedures (Signed)
NAMEDELITA, Allison [name]                  ACCOUNT NO.:  [id number]   MEDICAL RECORD NO.:  [id number]          PATIENT TYPE:  AMB   LOCATION:  [facility]                          FACILITY:  [facility]   PHYSICIAN:  Verne Carrow, MDDATE OF BIRTH:  05/31/1951   DATE OF PROCEDURE:  06/12/2010  DATE OF DISCHARGE:  06/12/2010                    PERIPHERAL VASCULAR INVASIVE PROCEDURE    PROCEDURE PERFORMED:  Atherectomy with a Silverhawk atherectomy device  in the ostium of the left superficial femoral artery and proximal  portion of the left superficial femoral artery.   OPERATOR:  Verne Carrow, MD   INDICATIONS:  Peripheral arterial disease.  The patient had noninvasive  studies in April showing ABI of 0.7 on the left and 0.93 on the right.  I saw her in the office on May 22, 2010.  Distal aortogram on  May 08, 2010 was performed at which time the patient was found to  have a severe stenosis in the ostium of the left superficial femoral  artery and moderate stenosis in the ostium of the right superficial  femoral artery.  There was also severe stenosis in the proximal portion  of the left superficial femoral artery.  We arranged a staged  intervention today.   DETAILS OF PROCEDURE:  The patient was brought to the main peripheral  vascular laboratory after signing informed consent for the procedure.  The right groin was prepped and draped in a sterile fashion.  Arterial  access was difficult given the small caliber of the patient's vessels.  Ultimately, we were able to gain access into the right femoral artery.  A 7-French crossover sheath was inserted into the iliac system.  At this  point, we used a Versacore wire and a crossover catheter and we were  able be engage the left iliac system.  A 7-French sheath was then  advanced around the aortic bifurcation and positioned into the distal  portion of the left external iliac artery.  The patient was given 5000  units of  intravenous heparin.  A Spartacor wire was then advanced  through the sheath and placed distal to the stenosis in the distal  superficial femoral artery.  When the patient's ACT was greater than  200, we inserted the Silverhawk LS-M atherectomy device.  Three passes  were made in the severe stenosis in the ostium of the superficial  femoral artery.  We pulled the device out and made sure there were no  perforations or dissections.  There was a significant improvement in the  lumen size of the vessel.  We also felt that there was severe stenosis  in the proximal portion of the left superficial femoral artery.  Given  the small size of the vessel, we made one pass with the atherectomy  device.  There was a significant improvement in the stenosis.  The  stenosis in the ostium was taken from 99% down to 10%.  The stenosis in  the proximal portion of the vessel was taken from 80% down to 10%.  The  patient tolerated the procedure well.  Final angiography demonstrated  patency of all three vessels below the  knee in the left leg.  There was  no immediate complication.  The sheath remained in place in the right  femoral artery.  The patient was taken to the holding area in stable  condition.   IMPRESSION:  1. Peripheral arterial disease.  2. Successful atherectomy of the left superficial femoral artery.   RECOMMENDATIONS:  The patient will be continued on aspirin and Plavix.  We will pull her sheath when her ACT is less than 175.  She will have 4  hours of bedrest and will be discharged to home tonight.  I will see her  back in the office in 2 weeks and perform ABIs at that time.      Verne Carrow, MD      CM/MEDQ  D:  06/12/2010  T:  06/13/2010  Job:  [id number]   Electronically Signed by Verne Carrow MD on 06/13/2010 04:13:28 PM

## 2010-11-19 NOTE — Procedures (Signed)
Allison [name], Allison [name] NO.:  [id number]   MEDICAL RECORD NO.:  [id number]          PATIENT TYPE:  OIB   LOCATION:  2899                         FACILITY:  [facility]   PHYSICIAN:  Verne Carrow, MDDATE OF BIRTH:  1950-10-03   DATE OF PROCEDURE:  05/08/2010  DATE OF DISCHARGE:  05/08/2010                    PERIPHERAL VASCULAR INVASIVE PROCEDURE    PROCEDURE PERFORMED:  Distal aortogram with bilateral lower extremity  runoff.   OPERATOR:  Verne Carrow, MD   INDICATIONS:  This is a 60 year old Caucasian female with history of  coronary artery disease with non-ST-elevation myocardial infarction in  February 2011, with drug-eluting stent placed in circumflex artery as  well as a history of hypertension, hyperlipidemia, and recent  __________ bilateral lower extremity pain with ambulation with  claudication.  Noninvasive studies in April showed an ABI of 0.93 right  and 0.70 left.  The patient does have lifestyle-altering claudication  and because of this a diagnostic procedure was __________.   DETAIL OF PROCEDURE:  The patient was brought to the main peripheral  vascular laboratory after signing informed consent for the procedure.  The right groin was prepped and draped in sterile fashion.  Lidocaine 1%  was used for local anesthesia.  A 5-French sheath was inserted into the  right femoral artery without difficulty.  A pigtail catheter was passed  in distal aorta.  Distal aortogram was performed.  Pigtail catheter just  above the takeoff of the bilateral renal arteries.  We then pulled the  pigtail catheter down to the level of the aortic bifurcation and  performed a pelvic angiogram and actually took an AP shot and took both  an RAO and LAO shot to better lay out the bifurcation of the profunda  femoral artery and the superficial femoral artery.  At this point, lower  extremity runoff was performed with both legs visualized through the  knee.  The  patient tolerated the procedure well.  The patient has taken  to the holding area in stable condition.   HEMODYNAMIC FINDINGS:  Blood pressure 153/60.   ANGIOGRAPHIC FINDINGS:  1. Right renal artery by 20% proximal stenosis.  2. The left renal artery has mild plaque disease.  3. 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  4. The right common iliac artery has 30% stenosis.  The right external      iliac artery had diffuse 20-30% disease, no focal lesion.  The      right external iliac artery has a discrete 90% stenosis.  The right      profunda femoral artery had a 99% ostial stenosis.  The right      superficial femoral artery had an ostial 60% stenosis.  The mid      right superficial femoral artery had mild plaque disease.  The      distal right superficial femoral artery has 30% stenosis.  The      right popliteal artery was patent.  There was 3-vessel runoff to      the right foot; however, the distal vessels were diffusely diseased      and became small  in caliber.  5. The left common iliac artery had 20% stenosis.  The left external      iliac artery had diffuse plaque disease.  The left internal iliac      artery has diffuse 50% plaque.  The left superficial femoral artery      had an ostial 95% stenosis.  The left proximal superficial femoral      artery had 80% stenosis.  The mid and distal left superficial      femoral artery has diffuse 50-60% stenosis.  There appeared to be 2-      vessel runoffs to the left foot with the peroneal artery not      reaching the foot.   IMPRESSION:  1. Severe peripheral vascular disease.  2. Severe stenosis in the ostium of the left superficial femoral      artery, ostium of the right profunda femoral artery, and the      proximal right superficial femoral artery.  3. Large stenosis in ostium of the right superficial femoral artery.   RECOMMENDATIONS:  This patient has severe peripheral vascular disease  with her lesions being  located in the ostium of the bilateral  superficial femoral artery with involvement of the right profunda  femoral artery.  We will stop today and review our option including  percutaneous therapy versus surgical therapy.  Further plans to follow.      Verne Carrow, MD      CM/MEDQ  D:  05/08/2010  T:  05/09/2010  Job:  [id number]   Electronically Signed by Verne Carrow MD on 05/11/2010 12:00:26 PM

## 2010-12-06 ENCOUNTER — Ambulatory Visit (INDEPENDENT_AMBULATORY_CARE_PROVIDER_SITE_OTHER): Payer: Medicaid Other

## 2010-12-06 DIAGNOSIS — T8189XA Other complications of procedures, not elsewhere classified, initial encounter: Secondary | ICD-10-CM

## 2010-12-06 DIAGNOSIS — I70219 Atherosclerosis of native arteries of extremities with intermittent claudication, unspecified extremity: Secondary | ICD-10-CM

## 2010-12-06 NOTE — Assessment & Plan Note (Signed)
OFFICE VISIT  DEISSY, GUILBERT DOB:  09-19-1950                                       12/06/2010 ZOXWR#:[id number]  ATTENDING PHYSICIAN:  Fransisco Hertz, MD.  This is a followup appointment for her left fem-pop bypass.  HISTORY OF PRESENT ILLNESS:  The patient is a 60 year old woman who had a left fem above knee popliteal bypass by Dr. Myra Gianotti on 10/21/2010. She had been doing well at home.  She noted that the inferior portion of the groin wound had separated and had some yellowish kind of material and clear drainage.  She had no real pain in the area.  No fevers, no chills and otherwise has been doing well.  She comes in today for evaluation.  PHYSICAL EXAM:  Heart rate of 65, sats are 100%, temperature probe was not working but she was cool to touch, her respiratory rate was 12. Left lower extremity was warm and pink.  She had palpable DP and PT pulses.  Left groin wound had a very superficial 1 mm deep wound with some exudative tissue.  It had separated slightly at the lower pole. There are no sutures visible.  This was slightly debrided and hydrogel was placed.  She had no cellulitis in the area and it was nontender. Her above knee wound was healing well without signs of infection.  The area of the groin wound was debrided and hydrogel was placed and the patient was instructed to do this daily.  ASSESSMENT AND PLAN:  Separation of the distal portion of the groin wound on the left side which was noninfected and very superficial.  This was debrided.  Plan is for the patient to shower daily and put hydrogel in this area and we will see her back in 1 week.  She also has an appointment with Dr. Myra Gianotti on June 11 for followup as well.  Della Goo, PA-C  Fransisco Hertz, MD Electronically Signed  RR/MEDQ  D:  12/06/2010  T:  12/06/2010  Job:  [id number]

## 2010-12-11 ENCOUNTER — Ambulatory Visit (INDEPENDENT_AMBULATORY_CARE_PROVIDER_SITE_OTHER): Payer: Medicaid Other

## 2010-12-11 DIAGNOSIS — T8189XA Other complications of procedures, not elsewhere classified, initial encounter: Secondary | ICD-10-CM

## 2010-12-11 NOTE — Discharge Summary (Signed)
  Allison [name], Allison [name]                  ACCOUNT NO.:  [id number]  MEDICAL RECORD NO.:  [id number]           PATIENT TYPE:  I  LOCATION:  2011                         FACILITY:  [facility]  PHYSICIAN:  Juleen China IV, MDDATE OF BIRTH:  Dec 02, 1950  DATE OF ADMISSION:  11/14/2010 DATE OF DISCHARGE:  11/16/2010                              DISCHARGE SUMMARY   ADMISSION DIAGNOSIS:  Left leg claudication.  HISTORY OF PRESENT ILLNESS:  This is a 60 year old female that Dr. Myra Gianotti saw at the request of Dr. Clifton James for surgical revascularization.  She has previously undergone atherectomy of the left superficial femoral artery which has failed.  She comes in for femoral to popliteal bypass.  HOSPITAL COURSE:  The patient admitted to the hospital, taken to the operating room on Nov 14, 2010, where she underwent a left common femoral to above-knee popliteal artery bypass grafting with 6-mm ring for patent PTFE.  She tolerated the procedure well and was transported to the recovery room in satisfactory condition.  By postoperative day #1, she was doing quite well and transported to the telemetry floor that day.  She was started on antibiotics for urinary tract infection.  By postoperative day #2, she was continuing to do well and was discharged home.  Otherwise, her postoperative course included increasing ambulation as well as increasing intake of solids without difficulty.  DISCHARGE INSTRUCTIONS:  She is discharged home with extensive instructions on wound care and progressive ambulation.  She is instructed not to drive or perform any heavy lifting until returning to Dr. Estanislado Spire office.  DISCHARGE DIAGNOSES: 1. Left leg claudication.     a.     Status post left common femoral to above-knee popliteal      artery bypass on Nov 14, 2010. 2. Hypertension. 3. Hypercholesterolemia. 4. Coronary artery disease. 5. Peripheral vascular disease. 6. Remote history of tobacco use.  DISCHARGE  MEDICATIONS: 1. Albuterol inhaler 2 puffs q.4 h. P.r.n. 2. Potassium 20 mEq p.o. daily. 3. Metoprolol 50 mg p.o. b.i.d. 4. Hydrochlorothiazide 25 mg p.o. daily. 5. Losartan 100 mg p.o. daily. 6. Plavix 75 mg p.o. daily. 7. Enteric-coated aspirin 81 mg p.o. daily. 8. Cipro 500 mg p.o. b.i.d. x5 days. 9. Percocet q.4 h. p.r.n. pain.  FOLLOWUP:  The patient is to follow up with Dr. Myra Gianotti in 2 weeks.    ______________________________ Newton Pigg, PA   ______________________________ V. Charlena Cross, MD    SE/MEDQ  D:  11/29/2010  T:  11/30/2010  Job:  [id number]  Electronically Signed by Newton Pigg PA on 12/03/2010 10:02:04 AM Electronically Signed by Arelia Longest IV MD on 12/11/2010 01:27:56 PM

## 2010-12-11 NOTE — Assessment & Plan Note (Signed)
OFFICE VISIT  Allison [name], Allison [name] DOB:  1950-10-23                                       12/11/2010 BMWUX#:[id number]  CHIEF COMPLAINT:  Follow up left groin wound.  The patient is a 60 year old woman who had a left fem above-knee popliteal bypass by Dr. Myra Gianotti with revision on 10/21/2010.  She noted that the inferior portion of the groin wound had separated with some yellowish material.  I saw her last week and began her on hydrogel after cleansing the area and she states that the area has improved greatly. She comes back today for a wound check.  PHYSICAL EXAMINATION:  General:  This is a well-developed, well- nourished woman in no acute distress.  Vital Signs:  Her heart rate was 70, her sats were 100%, and respiratory rate was 12.  The left groin wound was shrinking in size.  She still had some minimal exudative tissue.  It was very superficial without any drainage.  We will continue the hydrogel and she will follow up with Dr. Myra Gianotti in 1 week.  Della Goo, PA-C  V. Charlena Cross, MD Electronically Signed  RR/MEDQ  D:  12/11/2010  T:  12/11/2010  Job:  [id number]

## 2010-12-16 ENCOUNTER — Ambulatory Visit (INDEPENDENT_AMBULATORY_CARE_PROVIDER_SITE_OTHER): Payer: Medicaid Other | Admitting: Surgery

## 2010-12-16 DIAGNOSIS — I70219 Atherosclerosis of native arteries of extremities with intermittent claudication, unspecified extremity: Secondary | ICD-10-CM

## 2010-12-17 NOTE — Assessment & Plan Note (Signed)
OFFICE VISIT  HAROLDINE, REDLER DOB:  13-Jun-1951                                       12/16/2010 ZOXWR#:[id number]  The patient comes back in today for followup.  She has been seen by Rene Kocher several times for a wound that has opened up on the anterior aspect of her left groin.  She had a left common femoral to above knee popliteal bypass graft with Gore-Tex on May 10.  She has been doing dressing changes at home.  Today on examination the area of concern is very superficial.  It measures approximately 1.5 cm at the distal aspect of the incision.  I think this just needs to be continued to be packed and it should heal within the next several weeks.  The patient will come back to see me in 2 months to start her duplex surveillance.  She will contact me if there are any problems within the groin prior to that.    [name], MD Electronically Signed  VWB/MEDQ  D:  12/16/2010  T:  12/17/2010  Job:  [id number]

## 2011-01-27 ENCOUNTER — Encounter: Payer: Self-pay | Admitting: Surgery

## 2011-02-17 ENCOUNTER — Encounter (INDEPENDENT_AMBULATORY_CARE_PROVIDER_SITE_OTHER): Payer: Medicaid Other

## 2011-02-17 ENCOUNTER — Encounter: Payer: Self-pay | Admitting: Surgery

## 2011-02-17 ENCOUNTER — Ambulatory Visit (INDEPENDENT_AMBULATORY_CARE_PROVIDER_SITE_OTHER): Payer: Medicaid Other | Admitting: Surgery

## 2011-02-17 VITALS — BP 142/68 | HR 67 | Temp 97.9°F | Ht 67.5 in | Wt 160.0 lb

## 2011-02-17 DIAGNOSIS — Z9889 Other specified postprocedural states: Secondary | ICD-10-CM

## 2011-02-17 DIAGNOSIS — Z48812 Encounter for surgical aftercare following surgery on the circulatory system: Secondary | ICD-10-CM

## 2011-02-17 DIAGNOSIS — I739 Peripheral vascular disease, unspecified: Secondary | ICD-10-CM

## 2011-02-17 DIAGNOSIS — Z95828 Presence of other vascular implants and grafts: Secondary | ICD-10-CM

## 2011-02-17 DIAGNOSIS — I70219 Atherosclerosis of native arteries of extremities with intermittent claudication, unspecified extremity: Secondary | ICD-10-CM

## 2011-02-17 NOTE — Progress Notes (Signed)
Subjective:     Patient ID: Allison [name], female   DOB: 05/07/1951, 60 y.o.   MRN: [id number]  HPI The patient comes back today for followup. She is status post left common femoral to above-knee popliteal artery bypass graft with Gore-Tex on 11/14/2010. This was done for lifestyle limiting claudication. She did develop a area of concern in her left groin which ultimately healed with packing. She denies having symptoms of claudication at this time she has no new wounds.  Review of Systems  Constitutional: Negative for fever, chills and fatigue.  Cardiovascular: Positive for chest pain.  Skin: Negative for color change, rash and wound.   Past Medical History  Diagnosis Date  . Hypertension   . COPD (chronic obstructive pulmonary disease)   . Tobacco abuse     hx of  . Coronary artery disease     PCI, DES circumflex 2/11. moderate disease LAD  . Hyperlipidemia   . PVD (peripheral vascular disease)     lower extremity runoff 05/08/10    History  Substance Use Topics  . Smoking status: Former Smoker    Types: Cigarettes    Quit date: 08/12/2009  . Smokeless tobacco: Not on file   Comment: she has smoker 1-2 packs daily since the age of 28. 80 pack years  . Alcohol Use: No    Family History  Problem Relation Age of Onset  . Coronary artery disease Mother     deceased. coronary artery bypass graft  . Cirrhosis Father     deceased  . Other Brother     alive, unknown hx    Allergies  Allergen Reactions  . Ace Inhibitors   . Codeine   . WUJ:WJXBJYNWGNF+AOZHYQMVH+QIONGEXBMW Acid+Aspartame     Current outpatient prescriptions:Albuterol Sulfate (VENTOLIN HFA IN), Inhale into the lungs. As directed as needed , Disp: , Rfl: ;  aspirin 81 MG tablet, Take 81 mg by mouth daily.  , Disp: , Rfl: ;  clopidogrel (PLAVIX) 75 MG tablet, Take 75 mg by mouth daily.  , Disp: , Rfl: ;  losartan (COZAAR) 100 MG tablet, Take 100 mg by mouth daily.  , Disp: , Rfl:  metoprolol (LOPRESSOR) 50 MG  tablet, Take 50 mg by mouth 2 (two) times daily.  , Disp: , Rfl: ;  triamterene-hydrochlorothiazide (MAXZIDE-25) 37.5-25 MG per tablet, Take 1 tablet by mouth daily.  , Disp: , Rfl: ;  PARoxetine (PAXIL) 40 MG tablet, Take 40 mg by mouth every morning.  , Disp: , Rfl: ;  potassium chloride SA (KLOR-CON M20) 20 MEQ tablet, Take 1 tablet (20 mEq total) by mouth daily., Disp: 30 tablet, Rfl: 3  Filed Vitals:   02/17/11 1002  Height: 5' 7.5" (1.715 m)  Weight: 160 lb (72.576 kg)    Body mass index is 24.69 kg/(m^2).          Objective:   Physical Exam  Constitutional: She appears well-developed and well-nourished.  HENT:  Head: Normocephalic and atraumatic.  Cardiovascular: Normal rate.   Pulmonary/Chest: Effort normal.  Musculoskeletal: She exhibits no edema.  Skin:       No open wound. Incisions are well-healed appear   diagnostic studies: Duplex ultrasound was performed today. This shows the ABI on the right and 0.56 on the left is 0.74. Mock waveforms are monophasic. Within the bypass graft there are no areas of elevated velocity.     Assessment:     Status post left femoral-popliteal bypass    Plan:  Overall the patient is doing very well. She has no symptoms at this time. She was placed on our ultrasound surveillance protocol. I reviewed her carotid ultrasound from preoperative she will need his repeated in May of next year I will plan on seeing her back at that time.

## 2011-02-24 NOTE — Procedures (Unsigned)
BYPASS GRAFT EVALUATION  INDICATION:  Followup left bypass graft.  HISTORY: Diabetes:  No. Cardiac:  No. Hypertension:  Yes. Smoking:  Previous. Previous Surgery:  Left femoral to above knee popliteal bypass graft 11/14/2010.  SINGLE LEVEL ARTERIAL EXAM                              RIGHT              LEFT Brachial:                    168                165 Anterior tibial:             82                 94 Posterior tibial:            94                 125 Peroneal: Ankle/brachial index:        0.56               0.74  PREVIOUS ABI:  Date:  RIGHT:  LEFT:  LOWER EXTREMITY BYPASS GRAFT DUPLEX EXAM:  DUPLEX:  Patent left femoral to popliteal bypass graft with intimal wall thickening noted in the distal thigh region just prior to the distal anastomosis.  IMPRESSION: 1. Bilateral ankle brachial indices are suggestive of moderate     arterial disease. 2. Patent left femoral to above knee popliteal bypass graft with     velocity measurements noted on the following worksheet.        ___________________________________________ V. Charlena Cross, MD  EM/MEDQ  D:  02/17/2011  T:  02/17/2011  Job:  [id number]

## 2011-03-04 ENCOUNTER — Ambulatory Visit (INDEPENDENT_AMBULATORY_CARE_PROVIDER_SITE_OTHER): Payer: Medicaid Other | Admitting: Cardiovascular Disease

## 2011-03-04 ENCOUNTER — Encounter: Payer: Self-pay | Admitting: Cardiovascular Disease

## 2011-03-04 VITALS — BP 130/62 | HR 50 | Resp 14 | Ht 64.0 in | Wt 162.0 lb

## 2011-03-04 DIAGNOSIS — I739 Peripheral vascular disease, unspecified: Secondary | ICD-10-CM

## 2011-03-04 DIAGNOSIS — I2510 Atherosclerotic heart disease of native coronary artery without angina pectoris: Secondary | ICD-10-CM

## 2011-03-04 NOTE — Patient Instructions (Signed)
Your physician recommends that you schedule a follow-up appointment in: 6 months  

## 2011-03-04 NOTE — Progress Notes (Signed)
History of Present Illness:[name] with history of HTN, hyperlipidemia, CAD admitted with NSTEMI, respiratory failure February 2011, PVD here today for PV follow up. She underwent cardiac cath showing severe stenosis of the Circumflex, now s/p DES to the Circumflex. Her EF was found to be 30%, presumed to be non-ischemic in etiology. Echo with EF of 40-45%. She was discharged home on a beta blocker, statin, ASA and ARB. Her femoral angiogram showed a stenosis right SFA. Non-invasive studies in April with right ABI 0.93, Left ABI 0.70. There was suggestion of moderate disease in the left SFA ostium and moderate disease in the right SFA ostium. She had developed pain in both legs with walking 100 yards. This was described as cramping both calf muscles. Resolved with rest. No rest pain or ulcerations. I arranged a distal aortogram with bilateral lower extremity runoff on 05/08/10. She was found to have a a severe stenosis at the ostium of the left SFA and moderate stenosis at the ostium of the right SFA. The ostium of the right profunda femoral artery had a severe stenosis. [place] atherectomy of the left SFA ostium was performed on 06/12/10. She continued to have severe pain in her left leg following the procedure. Repeat angiography April 2012 with severe stenosis left SFA from ostium down through the distal SFA. Dr. Durene Cal evaluated the pt at my request and she had left femoral to above knee popliteal bypass on Nov 15, 2010. She did well and has been followed by Dr. Myra Gianotti for graft surveillance.   She is here today for cardiac and PV follow up. Her left leg feels much better. She has no pain in her left leg with walking. Her right leg hurts occasionally. No rest pain. No SOB.     Past Medical History  Diagnosis Date  . Hypertension   . COPD (chronic obstructive pulmonary disease)   . Tobacco abuse     hx of  . Coronary artery disease     PCI, DES circumflex 2/11. moderate disease LAD  .  Hyperlipidemia   . PVD (peripheral vascular disease)     lower extremity runoff 05/08/10    Past Surgical History  Procedure Date  . Tonsillectomy   . Coronary stent placement     Current Outpatient Prescriptions  Medication Sig Dispense Refill  . Albuterol Sulfate (VENTOLIN HFA IN) Inhale into the lungs. As directed as needed       . aspirin 81 MG tablet Take 81 mg by mouth daily.        . clopidogrel (PLAVIX) 75 MG tablet Take 75 mg by mouth daily.        Marland Kitchen losartan (COZAAR) 100 MG tablet Take 100 mg by mouth daily.        . metoprolol (LOPRESSOR) 50 MG tablet Take 50 mg by mouth 2 (two) times daily.        Marland Kitchen triamterene-hydrochlorothiazide (MAXZIDE-25) 37.5-25 MG per tablet Take 1 tablet by mouth daily.        Marland Kitchen PARoxetine (PAXIL) 40 MG tablet Take 40 mg by mouth every morning.        . potassium chloride SA (KLOR-CON M20) 20 MEQ tablet Take 1 tablet (20 mEq total) by mouth daily.  30 tablet  3    Allergies  Allergen Reactions  . Ace Inhibitors   . Codeine   . ZHY:QMVHQIONGEX+BMWUXLKGM+WNUUVOZDGU Acid+Aspartame     History   Social History  . Marital Status: Married    Spouse Name: N/A  Number of Children: 3  . Years of Education: N/A   Occupational History  . sculptor    Social History Main Topics  . Smoking status: Former Smoker    Types: Cigarettes    Quit date: 08/12/2009  . Smokeless tobacco: Not on file   Comment: she has smoker 1-2 packs daily since the age of 73. 80 pack years  . Alcohol Use: No  . Drug Use: No  . Sexually Active: Not on file   Other Topics Concern  . Not on file   Social History Narrative  . No narrative on file    Family History  Problem Relation Age of Onset  . Coronary artery disease Mother     deceased. coronary artery bypass graft  . Cirrhosis Father     deceased  . Other Brother     alive, unknown hx    Review of Systems:  As stated in the HPI and otherwise negative.   BP 130/62  Pulse 50  Resp 14  Ht 5\' 4"   (1.626 m)  Wt 162 lb (73.483 kg)  BMI 27.81 kg/m2  Physical Examination: General: Well developed, well nourished, NAD HEENT: OP clear, mucus membranes moist SKIN: warm, dry. No rashes. Neuro: No focal deficits Musculoskeletal: Muscle strength 5/5 all ext Psychiatric: Mood and affect normal Neck: No JVD, no carotid bruits, no thyromegaly, no lymphadenopathy. Lungs:Clear bilaterally, no wheezes, rhonci, crackles Cardiovascular: Bradycardia No murmurs, gallops or rubs. Abdomen:Soft. Bowel sounds present. Non-tender.  Extremities: No lower extremity edema. Pulses are non-palpable in the bilateral DP/PT.

## 2011-03-04 NOTE — Assessment & Plan Note (Signed)
Stable. No changes.  Will need lipids at next visit if not done in primary care.

## 2011-03-04 NOTE — Assessment & Plan Note (Signed)
Stable s/p left fem pop bypass. Followed by Dr. Myra Gianotti with non-invasive studies.

## 2011-04-11 ENCOUNTER — Telehealth: Payer: Self-pay | Admitting: Cardiovascular Disease

## 2011-04-11 MED ORDER — LOSARTAN POTASSIUM 100 MG PO TABS: 100.0000 mg | ORAL_TABLET | Freq: Every day | ORAL | Status: DC

## 2011-04-11 NOTE — Telephone Encounter (Signed)
Rx refill sent to pharmacy. 

## 2011-04-11 NOTE — Telephone Encounter (Signed)
Pt needs Kozzar 100mg  Qd called into [facility] in [place] [phone]

## 2011-07-02 ENCOUNTER — Telehealth: Payer: Self-pay | Admitting: Cardiovascular Disease

## 2011-07-02 NOTE — Telephone Encounter (Signed)
New Problem:    PAtient called in to request that all of her medications be refilled at the pharmacy she has on file.

## 2011-07-03 ENCOUNTER — Other Ambulatory Visit: Payer: Self-pay

## 2011-07-03 DIAGNOSIS — E876 Hypokalemia: Secondary | ICD-10-CM

## 2011-07-03 MED ORDER — POTASSIUM CHLORIDE CRYS ER 20 MEQ PO TBCR: 20.0000 meq | EXTENDED_RELEASE_TABLET | Freq: Every day | ORAL | Status: DC

## 2011-07-03 MED ORDER — LOSARTAN POTASSIUM 100 MG PO TABS: 100.0000 mg | ORAL_TABLET | Freq: Every day | ORAL | Status: DC

## 2011-07-03 MED ORDER — TRIAMTERENE-HCTZ 37.5-25 MG PO TABS: 1.0000 | ORAL_TABLET | Freq: Every day | ORAL | Status: DC

## 2011-07-03 MED ORDER — METOPROLOL TARTRATE 50 MG PO TABS: 50.0000 mg | ORAL_TABLET | Freq: Two times a day (BID) | ORAL | Status: DC

## 2011-07-03 MED ORDER — CLOPIDOGREL BISULFATE 75 MG PO TABS: 75.0000 mg | ORAL_TABLET | Freq: Every day | ORAL | Status: DC

## 2011-09-02 ENCOUNTER — Ambulatory Visit (INDEPENDENT_AMBULATORY_CARE_PROVIDER_SITE_OTHER): Payer: Medicaid Other | Admitting: Cardiovascular Disease

## 2011-09-02 ENCOUNTER — Encounter: Payer: Self-pay | Admitting: Cardiovascular Disease

## 2011-09-02 VITALS — BP 140/80 | HR 66 | Ht 67.0 in | Wt 177.0 lb

## 2011-09-02 DIAGNOSIS — I779 Disorder of arteries and arterioles, unspecified: Secondary | ICD-10-CM

## 2011-09-02 DIAGNOSIS — I2510 Atherosclerotic heart disease of native coronary artery without angina pectoris: Secondary | ICD-10-CM

## 2011-09-02 DIAGNOSIS — I739 Peripheral vascular disease, unspecified: Secondary | ICD-10-CM | POA: Insufficient documentation

## 2011-09-02 DIAGNOSIS — I255 Ischemic cardiomyopathy: Secondary | ICD-10-CM

## 2011-09-02 DIAGNOSIS — I2589 Other forms of chronic ischemic heart disease: Secondary | ICD-10-CM

## 2011-09-02 LAB — LDL CHOLESTEROL, DIRECT: Direct LDL: 180.5 mg/dL

## 2011-09-02 LAB — LIPID PANEL
Cholesterol: 249 mg/dL — ABNORMAL HIGH (ref 0–200)
Total CHOL/HDL Ratio: 5
Triglycerides: 137 mg/dL (ref 0.0–149.0)
VLDL: 27.4 mg/dL (ref 0.0–40.0)

## 2011-09-02 MED ORDER — SIMVASTATIN 20 MG PO TABS: 20.0000 mg | ORAL_TABLET | Freq: Every evening | ORAL | Status: DC

## 2011-09-02 MED ORDER — ALBUTEROL SULFATE HFA 108 (90 BASE) MCG/ACT IN AERS: 2.0000 | INHALATION_SPRAY | Freq: Four times a day (QID) | RESPIRATORY_TRACT | Status: DC | PRN

## 2011-09-02 MED ORDER — LOSARTAN POTASSIUM 100 MG PO TABS: 100.0000 mg | ORAL_TABLET | Freq: Every day | ORAL | Status: DC

## 2011-09-02 MED ORDER — ESCITALOPRAM OXALATE 10 MG PO TABS: 10.0000 mg | ORAL_TABLET | Freq: Every day | ORAL | Status: DC

## 2011-09-02 NOTE — Assessment & Plan Note (Addendum)
Stable s/p left fem/pop bypass May 2012. She is known to have moderate disease in the ostium of the right SFA. Surveillance per Dr. Myra Gianotti.

## 2011-09-02 NOTE — Patient Instructions (Signed)
Your physician wants you to follow-up in: 6 months.  You will receive a reminder letter in the mail two months in advance. If you don't receive a letter, please call our office to schedule the follow-up appointment.  Your physician has requested that you have an echocardiogram. Echocardiography is a painless test that uses sound waves to create images of your heart. It provides your doctor with information about the size and shape of your heart and how well your heart's chambers and valves are working. This procedure takes approximately one hour. There are no restrictions for this procedure.   Your physician has recommended you make the following change in your medication: Start simvastatin 20 mg by mouth daily at bedtime

## 2011-09-02 NOTE — Assessment & Plan Note (Addendum)
Last echo 2011. She is on good therapy. Will repeat echo next week.

## 2011-09-02 NOTE — Progress Notes (Signed)
History of Present Illness: 61 yo WF with history of HTN, hyperlipidemia, CAD admitted with NSTEMI, respiratory failure February 2011, PAD here today for cardiac and PV follow up. She underwent cardiac cath in February 2011 in the setting of a NSTEMI showing severe stenosis of the Circumflex, now s/p DES to the Circumflex. Her EF was found to be 30%, presumed to be non-ischemic in etiology. Echo on 08/14/09 with EF of 40-45%. She was discharged home on a beta blocker, statin, ASA and ARB. Her femoral angiogram showed a stenosis right SFA. Non-invasive studies in April with right ABI 0.93, Left ABI 0.70. There was suggestion of moderate disease in the left SFA ostium and moderate disease in the right SFA ostium. She had developed pain in both legs with walking 100 yards. This was described as cramping both calf muscles. Resolved with rest. No rest pain or ulcerations. I arranged a distal aortogram with bilateral lower extremity runoff on 05/08/10. She was found to have a a severe stenosis at the ostium of the left SFA and moderate stenosis at the ostium of the right SFA. The ostium of the right profunda femoral artery had a severe stenosis. [place] atherectomy of the left SFA ostium was performed on 06/12/10. She continued to have severe pain in her left leg following the procedure. Repeat angiography April 2012 with severe stenosis left SFA from ostium down through the distal SFA. Dr. Durene Cal evaluated the pt at my request and she had left femoral to above knee popliteal bypass on Nov 15, 2010. She did well and has been followed by Dr. Myra Gianotti for graft surveillance.   She is here today for cardiac and PV follow up. She reports no chest pain or SOB. Her left leg feels much better. She has no pain in her left leg with walking. Her right leg hurts rarely. No rest pain.     Primary Care Physician:  Last Lipid Profile:  Past Medical History  Diagnosis Date  . Hypertension   . COPD (chronic  obstructive pulmonary disease)   . Tobacco abuse     hx of  . Coronary artery disease     PCI, DES circumflex 2/11. moderate disease LAD  . Hyperlipidemia   . PVD (peripheral vascular disease)     lower extremity runoff 05/08/10  . Ischemic cardiomyopathy     Echo 08/14/09 with LVEF 40-45%.     Past Surgical History  Procedure Date  . Tonsillectomy   . Coronary stent placement     Current Outpatient Prescriptions  Medication Sig Dispense Refill  . Albuterol Sulfate (VENTOLIN HFA IN) Inhale into the lungs. As directed as needed       . aspirin 81 MG tablet Take 81 mg by mouth daily.        . clopidogrel (PLAVIX) 75 MG tablet Take 1 tablet (75 mg total) by mouth daily.  30 tablet  6  . escitalopram (LEXAPRO) 10 MG tablet Take 10 mg by mouth daily.      Marland Kitchen losartan (COZAAR) 100 MG tablet Take 1 tablet (100 mg total) by mouth daily.  30 tablet  6  . metoprolol (LOPRESSOR) 50 MG tablet Take 1 tablet (50 mg total) by mouth 2 (two) times daily.  60 tablet  6  . triamterene-hydrochlorothiazide (MAXZIDE-25) 37.5-25 MG per tablet Take 1 each (1 tablet total) by mouth daily.  30 tablet  6    Allergies  Allergen Reactions  . Ace Inhibitors   . Codeine   .  BMW:[id number]+[id number] Acid+Aspartame     History   Social History  . Marital Status: Married    Spouse Name: N/A    Number of Children: 3  . Years of Education: N/A   Occupational History  . sculptor    Social History Main Topics  . Smoking status: Former Smoker    Types: Cigarettes    Quit date: 08/12/2009  . Smokeless tobacco: Not on file   Comment: she has smoker 1-2 packs daily since the age of 8. 80 pack years  . Alcohol Use: No  . Drug Use: No  . Sexually Active: Not on file   Other Topics Concern  . Not on file   Social History Narrative  . No narrative on file    Family History  Problem Relation Age of Onset  . Coronary artery disease Mother     deceased. coronary artery bypass graft  .  Cirrhosis Father     deceased  . Other Brother     alive, unknown hx    Review of Systems:  As stated in the HPI and otherwise negative.   BP 140/80  Pulse 66  Ht 5\' 7"  (1.702 m)  Wt 177 lb (80.287 kg)  BMI 27.72 kg/m2  Physical Examination: General: Well developed, well nourished, NAD HEENT: OP clear, mucus membranes moist SKIN: warm, dry. No rashes. Neuro: No focal deficits Musculoskeletal: Muscle strength 5/5 all ext Psychiatric: Mood and affect normal Neck: No JVD, no carotid bruits, no thyromegaly, no lymphadenopathy. Lungs:Clear bilaterally, no wheezes, rhonci, crackles Cardiovascular: Regular rate and rhythm. No murmurs, gallops or rubs. Abdomen:Soft. Bowel sounds present. Non-tender.  Extremities: No lower extremity edema. Pulses are trace in the bilateral DP/PT.  EKG: Sinus rhythm with PACs. Rate 66 bpm. Non-specific ST

## 2011-09-02 NOTE — Assessment & Plan Note (Addendum)
Stable. Will continue beta blocker, ASA, Plavix, ARB and will restart statin. Check lipids today.

## 2011-09-16 ENCOUNTER — Ambulatory Visit (HOSPITAL_COMMUNITY): Payer: Medicaid Other | Attending: Cardiovascular Disease

## 2011-09-16 ENCOUNTER — Other Ambulatory Visit: Payer: Self-pay

## 2011-09-16 DIAGNOSIS — I255 Ischemic cardiomyopathy: Secondary | ICD-10-CM

## 2011-09-16 DIAGNOSIS — I080 Rheumatic disorders of both mitral and aortic valves: Secondary | ICD-10-CM | POA: Insufficient documentation

## 2011-09-16 DIAGNOSIS — I2589 Other forms of chronic ischemic heart disease: Secondary | ICD-10-CM

## 2011-09-16 DIAGNOSIS — I079 Rheumatic tricuspid valve disease, unspecified: Secondary | ICD-10-CM | POA: Insufficient documentation

## 2011-09-18 ENCOUNTER — Other Ambulatory Visit: Payer: Self-pay | Admitting: *Deleted

## 2011-09-18 DIAGNOSIS — E785 Hyperlipidemia, unspecified: Secondary | ICD-10-CM

## 2011-10-21 ENCOUNTER — Encounter: Payer: Self-pay | Admitting: *Deleted

## 2011-10-24 ENCOUNTER — Telehealth: Payer: Self-pay | Admitting: Cardiovascular Disease

## 2011-10-24 NOTE — Telephone Encounter (Signed)
Fu call Patient is calling about test results

## 2011-10-24 NOTE — Telephone Encounter (Signed)
Pt aware of ECHO results. Debbie Elainna Eshleman RN  

## 2011-11-06 ENCOUNTER — Encounter: Payer: Self-pay | Admitting: Neurosurgery

## 2011-11-10 ENCOUNTER — Other Ambulatory Visit: Payer: Medicaid Other

## 2011-11-10 ENCOUNTER — Ambulatory Visit: Payer: Medicaid Other | Admitting: Neurosurgery

## 2011-11-25 ENCOUNTER — Other Ambulatory Visit (INDEPENDENT_AMBULATORY_CARE_PROVIDER_SITE_OTHER): Payer: Medicaid Other

## 2011-11-25 DIAGNOSIS — E785 Hyperlipidemia, unspecified: Secondary | ICD-10-CM

## 2011-11-25 LAB — HEPATIC FUNCTION PANEL
ALT: 9 U/L (ref 0–35)
AST: 14 U/L (ref 0–37)
Albumin: 3.8 g/dL (ref 3.5–5.2)
Alkaline Phosphatase: 88 U/L (ref 39–117)
Total Protein: 7.6 g/dL (ref 6.0–8.3)

## 2011-11-27 ENCOUNTER — Encounter: Payer: Self-pay | Admitting: Neurosurgery

## 2011-11-28 ENCOUNTER — Ambulatory Visit (INDEPENDENT_AMBULATORY_CARE_PROVIDER_SITE_OTHER): Payer: Medicaid Other | Admitting: Vascular Surgery

## 2011-11-28 ENCOUNTER — Ambulatory Visit (INDEPENDENT_AMBULATORY_CARE_PROVIDER_SITE_OTHER): Payer: Medicaid Other | Admitting: Neurosurgery

## 2011-11-28 ENCOUNTER — Encounter: Payer: Self-pay | Admitting: Neurosurgery

## 2011-11-28 VITALS — BP 147/72 | HR 59 | Resp 18 | Ht 67.5 in | Wt 172.0 lb

## 2011-11-28 DIAGNOSIS — Z48812 Encounter for surgical aftercare following surgery on the circulatory system: Secondary | ICD-10-CM

## 2011-11-28 DIAGNOSIS — I739 Peripheral vascular disease, unspecified: Secondary | ICD-10-CM

## 2011-11-28 DIAGNOSIS — I70219 Atherosclerosis of native arteries of extremities with intermittent claudication, unspecified extremity: Secondary | ICD-10-CM

## 2011-11-28 DIAGNOSIS — I6529 Occlusion and stenosis of unspecified carotid artery: Secondary | ICD-10-CM

## 2011-11-28 NOTE — Progress Notes (Signed)
[facility] [place] HISTORY AND PHYSICAL   CC: Lower extremity duplex bypass graft scan one year status post graft, ABIs, carotid duplex Referring Physician: Brabham  History of Present Illness: 61 year old female patient of Dr. Myra Gianotti that is one year status post left common femoral to above-the-knee popliteal artery bypass graft using Gore-Tex.. The patient also is on surveillance for carotid stenosis. Patient reports no claudication "any worse than I've ever had", she also denies signs or symptoms of CVA, TIA, amaurosis fugax or any neural deficits.  Past Medical History  Diagnosis Date  . Hypertension   . COPD (chronic obstructive pulmonary disease)   . Tobacco abuse     hx of  . Coronary artery disease     PCI, DES circumflex 2/11. moderate disease LAD  . Hyperlipidemia   . PVD (peripheral vascular disease)     lower extremity runoff 05/08/10  . Ischemic cardiomyopathy     Echo 08/14/09 with LVEF 40-45%.     ROS: [x]  Positive   [ ]  Denies    General: [ ]  Weight loss, [ ]  Fever, [ ]  chills Neurologic: [ ]  Dizziness, [ ]  Blackouts, [ ]  Seizure [ ]  Stroke, [ ]  "Mini stroke", [ ]  Slurred speech, [ ]  Temporary blindness; [ ]  weakness in arms or legs, [ ]  Hoarseness Cardiac: [ ]  Chest pain/pressure, [ ]  Shortness of breath at rest [ ]  Shortness of breath with exertion, [ ]  Atrial fibrillation or irregular heartbeat Vascular: [ ]  Pain in legs with walking, [ ]  Pain in legs at rest, [ ]  Pain in legs at night,  [ ]  Non-healing ulcer, [ ]  Blood clot in vein/DVT,   Pulmonary: [ ]  Home oxygen, [ ]  Productive cough, [ ]  Coughing up blood, [ ]  Asthma,  [ ]  Wheezing Musculoskeletal:  [ ]  Arthritis, [ ]  Low back pain, [ ]  Joint pain Hematologic: [ ]  Easy Bruising, [ ]  Anemia; [ ]  Hepatitis Gastrointestinal: [ ]  Blood in stool, [ ]  Gastroesophageal Reflux/heartburn, [ ]  Trouble swallowing Urinary: [ ]  chronic Kidney disease, [ ]  on HD - [ ]  MWF or [ ]  TTHS, [ ]  Burning  with urination, [ ]  Difficulty urinating Skin: [ ]  Rashes, [ ]  Wounds Psychological: [ ]  Anxiety, [ ]  Depression   Social History History  Substance Use Topics  . Smoking status: Former Smoker -- 1.5 packs/day for 30 years    Types: Cigarettes    Quit date: 08/12/2009  . Smokeless tobacco: Never Used   Comment: she has smoker 1-2 packs daily since the age of 36. 80 pack years  . Alcohol Use: No    Family History Family History  Problem Relation Age of Onset  . Coronary artery disease Mother     deceased. coronary artery bypass graft  . Cancer Mother     BREAST  . Heart disease Mother   . Cirrhosis Father     deceased  . Cancer Father   . Other Brother     alive, unknown hx    Allergies  Allergen Reactions  . Ace Inhibitors   . Amoxicillin-Pot Clavulanate   . Codeine     Current Outpatient Prescriptions  Medication Sig Dispense Refill  . albuterol (VENTOLIN HFA) 108 (90 BASE) MCG/ACT inhaler Inhale 2 puffs into the lungs every 6 (six) hours as needed for wheezing.  2 Inhaler  11  . aspirin 81 MG tablet Take 81 mg by mouth daily.        Marland Kitchen  clopidogrel (PLAVIX) 75 MG tablet Take 1 tablet (75 mg total) by mouth daily.  30 tablet  6  . escitalopram (LEXAPRO) 10 MG tablet Take 1 tablet (10 mg total) by mouth daily.  30 tablet  11  . losartan (COZAAR) 100 MG tablet Take 1 tablet (100 mg total) by mouth daily.  30 tablet  11  . metoprolol (LOPRESSOR) 50 MG tablet Take 1 tablet (50 mg total) by mouth 2 (two) times daily.  60 tablet  6  . simvastatin (ZOCOR) 20 MG tablet Take 1 tablet (20 mg total) by mouth every evening.  30 tablet  11  . triamterene-hydrochlorothiazide (MAXZIDE-25) 37.5-25 MG per tablet Take 1 each (1 tablet total) by mouth daily.  30 tablet  6    Physical Examination  Filed Vitals:   11/28/11 1456  BP: 147/72  Pulse: 59  Resp:     Body mass index is 26.54 kg/(m^2).  General:  WDWN in NAD Gait: Normal HEENT: WNL Eyes: Pupils equal Pulmonary:  normal non-labored breathing , without Rales, rhonchi,  wheezing Cardiac: RRR, without  Murmurs, rubs or gallops; Abdomen: soft, NT, no masses Skin: no rashes, ulcers noted  Vascular Exam Pulses: Palpable femoral pulses bilaterally, 2+ radial pulses bilaterally, carotid pulses to auscultation, no bruits are heard.  Extremities without ischemic changes, no Gangrene , no cellulitis; no open wounds;  Musculoskeletal: no muscle wasting or atrophy   Neurologic: A&O X 3; Appropriate Affect ; SENSATION: normal; MOTOR FUNCTION:  moving all extremities equally. Speech is fluent/normal  Non-Invasive Vascular Imaging CAROTID DUPLEX 11/28/2011  Right ICA 40 - 59 % stenosis Left ICA 20 - 39 % stenosis Lower extremity bypass graft shows some elevated velocities proximal to the proximal anastomosis, the readings are varied from 312-258 at the anastomosis site. ABIs today are 0.66 on the right and biphasic, 0.84 on the left also biphasic  ASSESSMENT/PLAN: Assessment as above, I discussed findings with Dr. Imogene Burn who recommends the patient have a left lower extremity arteriogram with possible intervention. This will be scheduled with Dr. Myra Gianotti for the next one to 2 weeks and followup with Dr. Myra Gianotti be pending that procedure. The patient's in agreement with this, her questions were encouraged and answered.  Lauree Chandler ANP   Clinic MD: Imogene Burn

## 2011-12-05 ENCOUNTER — Other Ambulatory Visit: Payer: Self-pay

## 2011-12-08 ENCOUNTER — Encounter (HOSPITAL_COMMUNITY): Payer: Self-pay | Admitting: Respiratory Therapy

## 2011-12-15 MED ORDER — SODIUM CHLORIDE 0.9 % IV SOLN
INTRAVENOUS | Status: DC
  Administered 2011-12-16: 10:00:00 via INTRAVENOUS

## 2011-12-16 ENCOUNTER — Encounter (HOSPITAL_COMMUNITY)
Admission: RE | Disposition: A | Discharge status: Home / Self Care | Payer: Self-pay | Source: Ambulatory Visit | Attending: Surgery

## 2011-12-16 ENCOUNTER — Ambulatory Visit (HOSPITAL_COMMUNITY)
Admission: RE | Admit: 2011-12-16 | Discharge: 2011-12-16 | Disposition: A | Discharge status: Home / Self Care | Payer: Medicaid Other | Source: Ambulatory Visit | Attending: Surgery | Admitting: Surgery

## 2011-12-16 ENCOUNTER — Telehealth: Payer: Self-pay | Admitting: Surgery

## 2011-12-16 DIAGNOSIS — I10 Essential (primary) hypertension: Secondary | ICD-10-CM | POA: Insufficient documentation

## 2011-12-16 DIAGNOSIS — I739 Peripheral vascular disease, unspecified: Secondary | ICD-10-CM

## 2011-12-16 DIAGNOSIS — J4489 Other specified chronic obstructive pulmonary disease: Secondary | ICD-10-CM | POA: Insufficient documentation

## 2011-12-16 DIAGNOSIS — I2589 Other forms of chronic ischemic heart disease: Secondary | ICD-10-CM | POA: Insufficient documentation

## 2011-12-16 DIAGNOSIS — I70209 Unspecified atherosclerosis of native arteries of extremities, unspecified extremity: Secondary | ICD-10-CM | POA: Insufficient documentation

## 2011-12-16 DIAGNOSIS — E785 Hyperlipidemia, unspecified: Secondary | ICD-10-CM | POA: Insufficient documentation

## 2011-12-16 DIAGNOSIS — Z87891 Personal history of nicotine dependence: Secondary | ICD-10-CM | POA: Insufficient documentation

## 2011-12-16 DIAGNOSIS — J449 Chronic obstructive pulmonary disease, unspecified: Secondary | ICD-10-CM | POA: Insufficient documentation

## 2011-12-16 DIAGNOSIS — I2510 Atherosclerotic heart disease of native coronary artery without angina pectoris: Secondary | ICD-10-CM | POA: Insufficient documentation

## 2011-12-16 HISTORY — PX: LOWER EXTREMITY ANGIOGRAM: SHX5508

## 2011-12-16 HISTORY — PX: LOWER EXTREMITY ANGIOGRAM: SHX5955

## 2011-12-16 LAB — POCT I-STAT, CHEM 8
HCT: 37 % (ref 36.0–46.0)
Hemoglobin: 12.6 g/dL (ref 12.0–15.0)
Sodium: 139 mEq/L (ref 135–145)
TCO2: 24 mmol/L (ref 0–100)

## 2011-12-16 SURGERY — ANGIOGRAM, LOWER EXTREMITY
Anesthesia: LOCAL

## 2011-12-16 MED ORDER — ACETAMINOPHEN 325 MG RE SUPP: 325.0000 mg | RECTAL | Status: DC | PRN

## 2011-12-16 MED ORDER — ACETAMINOPHEN 325 MG PO TABS: 325.0000 mg | ORAL_TABLET | ORAL | Status: DC | PRN

## 2011-12-16 MED ORDER — MORPHINE SULFATE 10 MG/ML IJ SOLN: 2.0000 mg | INTRAMUSCULAR | Status: DC | PRN

## 2011-12-16 MED ORDER — MIDAZOLAM HCL 2 MG/2ML IJ SOLN
INTRAMUSCULAR | Status: AC
  Filled 2011-12-16: qty 2

## 2011-12-16 MED ORDER — HEPARIN (PORCINE) IN NACL 2-0.9 UNIT/ML-% IJ SOLN
INTRAMUSCULAR | Status: AC
  Filled 2011-12-16: qty 1000

## 2011-12-16 MED ORDER — OXYCODONE HCL 5 MG PO TABS: 5.0000 mg | ORAL_TABLET | ORAL | Status: DC | PRN

## 2011-12-16 MED ORDER — CLONIDINE HCL 0.2 MG PO TABS: 0.2000 mg | ORAL_TABLET | ORAL | Status: DC | PRN

## 2011-12-16 MED ORDER — SODIUM CHLORIDE 0.9 % IV SOLN: 1.0000 mL/kg/h | INTRAVENOUS | Status: DC

## 2011-12-16 MED ORDER — ALUM & MAG HYDROXIDE-SIMETH 200-200-20 MG/5ML PO SUSP: 15.0000 mL | ORAL | Status: DC | PRN

## 2011-12-16 MED ORDER — PHENOL 1.4 % MT LIQD: 1.0000 | OROMUCOSAL | Status: DC | PRN

## 2011-12-16 MED ORDER — METOPROLOL TARTRATE 1 MG/ML IV SOLN: 2.0000 mg | INTRAVENOUS | Status: DC | PRN

## 2011-12-16 MED ORDER — ONDANSETRON HCL 4 MG/2ML IJ SOLN: 4.0000 mg | Freq: Four times a day (QID) | INTRAMUSCULAR | Status: DC | PRN

## 2011-12-16 MED ORDER — FENTANYL CITRATE 0.05 MG/ML IJ SOLN
INTRAMUSCULAR | Status: AC
  Filled 2011-12-16: qty 2

## 2011-12-16 MED ORDER — LIDOCAINE HCL (PF) 1 % IJ SOLN
INTRAMUSCULAR | Status: AC
  Filled 2011-12-16: qty 30

## 2011-12-16 NOTE — Op Note (Signed)
[facility]  Patient name: Allison [name] MRN: [id number] DOB: [birth year] Sex: female  12/16/2011 Pre-operative Diagnosis: Rule out left femoropopliteal bypass graft stenosis Post-operative diagnosis:  Same Surgeon:  [name] Procedure Performed:  1.  ultrasound access right femoral artery  2.  abdominal aortogram  3.  bilateral lower extremity runoff  4.  second order catheterization    Indications:  The patient was seen and evaluated in the office for followup. She is status post left femoral to above-knee popliteal artery bypass graft with Gore-Tex. Ultrasound identified elevated velocities within the common femoral artery, proximal to the proximal anastomosis. The patient is symptom-free. She comes in today for further evaluation and possible intervention  Procedure:  The patient was identified in the holding area and taken to room 8.  The patient was then placed supine on the table and prepped and draped in the usual sterile fashion.  A time out was called.  Ultrasound was used to evaluate the right common femoral artery.  It was patent .  A digital ultrasound image was acquired.  A micropuncture needle was used to access the right common femoral artery under ultrasound guidance.  An 018 wire was advanced without resistance and a micropuncture sheath was placed.  The 018 wire was removed and a benson wire was placed.  The micropuncture sheath was exchanged for a 5 french sheath.  An omniflush catheter was advanced over the wire to the level of L-1.  An abdominal angiogram was obtained.  Next, using the omniflush catheter and a benson wire, the aortic bifurcation was crossed and the catheter was placed into the left external iliac artery and left runoff was obtained.  right runoff was performed via retrograde sheath injections.  Findings:   Aortogram:  The visualized portions of the suprarenal abdominal aorta showed no significant disease. No evidence  of renal artery stenosis is identified. The infrarenal abdominal aorta is widely patent. Bilateral common and external iliac arteries are patent but very small in caliber. No hemodynamically significant stenosis is identified.  Right Lower Extremity:  The right common femoral artery is patent throughout it's course. The profunda femoral and superficial femoral artery are widely patent. These arteries are very small in caliber approximately 3 mm. The popliteal artery is patent throughout it's course. There is three-vessel runoff at least to the ankle. Full extent of the tibial vessels was not able to be evaluated with the contrast injection. Vessels are very small in caliber.  Left Lower Extremity:  The left common femoral artery is patent however there is approximately a 50-60% stenosis above the origin of the femoral-popliteal bypass graft. The profunda femoral artery is widely patent the proximal anastomosis and distal anastomosis which is to the above-knee popliteal artery is widely patent. Runoff is through the posterior tibial artery.  Intervention:  Based on the above images and in particular the size of the patient's vessels I elected not to intervene.  Impression:  #1  widely patent left femoral to above-knee popliteal artery bypass graft.  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 This was not addressed today because of the small caliber of the patient's vessels. I discussed with her continuing to follow this with ultrasound. In the future she may require intervention.    Preferentially, this would be with atherectomy and angioplasty.    Juleen China, M.D. [facility] [place] Office: [phone] Pager:  [phone]

## 2011-12-16 NOTE — Telephone Encounter (Addendum)
Message copied by Shari Prows on Tue Dec 16, 2011  3:13 PM ------      Message from: Melene Plan      Created: Tue Dec 16, 2011  2:33 PM                   ----- Message -----         From: Nada Libman, MD         Sent: 12/16/2011   2:01 PM           To: Reuel Derby, Melene Plan, RN            12/16/2011, the patient had the following procedures:                   1.  ultrasound access right femoral artery       2.  abdominal aortogram       3.  bilateral lower extremity runoff       4.  second order catheterization                        Please schedule the patient to come back in 3 months with a duplex ultrasound of the left leg and bilateral ankle-brachial indices. i scheduled an appt for pt on Monday 03/22/12 at 1:30pm per above staff message. I called hm ph# and left message with her grand daughter and also mailed an appt letter to her home. Jacklyn Shell

## 2011-12-16 NOTE — Interval H&P Note (Signed)
History and Physical Interval Note:  12/16/2011 12:02 PM  Allison [name]  has presented today for surgery, with the diagnosis of PVD  The various methods of treatment have been discussed with the patient and family. After consideration of risks, benefits and other options for treatment, the patient has consented to  Procedure(s) (LRB): LOWER EXTREMITY ANGIOGRAM (N/A) as a surgical intervention .  The patients' history has been reviewed, patient examined, no change in status, stable for surgery.  I have reviewed the patients' chart and labs.  Questions were answered to the patient's satisfaction.     Ryheem Jay IV, V. WELLS

## 2011-12-16 NOTE — Discharge Instructions (Signed)
Groin Site Care Refer to this sheet in the next few weeks. These instructions provide you with information on caring for yourself after your procedure. Your caregiver may also give you more specific instructions. Your treatment has been planned according to current medical practices, but problems sometimes occur. Call your caregiver if you have any problems or questions after your procedure. HOME CARE INSTRUCTIONS  You may shower 24 hours after the procedure. Remove the bandage (dressing) and gently wash the site with plain soap and water. Gently pat the site dry.   Do not apply powder or lotion to the site.   Do not sit in a bathtub, swimming pool, or whirlpool for 5 to 7 days.   No bending, squatting, or lifting anything over 10 pounds (4.5 kg) as directed by your caregiver.   Inspect the site at least twice daily.   Do not drive home if you are discharged the same day of the procedure. Have someone else drive you.   You may drive 24 hours after the procedure unless otherwise instructed by your caregiver.  What to expect:  Any bruising will usually fade within 1 to 2 weeks.    Blood that collects in the tissue (hematoma) may be painful to the touch. It should usually decrease in size and tenderness within 1 to 2 weeks.  SEEK IMMEDIATE MEDICAL CARE IF:  Peripheral Vascular Disease  Peripheral vascular disease (PVD) is caused by cholesterol buildup in the arteries. The arteries become narrow or clogged. This makes it hard for blood to flow. It happens most in the legs, but it can occur in other areas of your body. HOME CARE   Quit smoking, if you smoke.   Exercise as told by your doctor.   Follow a low-fat, low-cholesterol diet as told by your doctor.   Control your diabetes, if you have diabetes.   Care for your feet to prevent infection.   Only take medicine as told by your doctor.  GET HELP RIGHT AWAY IF:   You have pain or lose feeling (numbness) in your arms or legs.     Your arms or legs turn cold or blue.   You have redness, warmth, and puffiness (swelling) in your arms or legs.  MAKE SURE YOU:   Understand these instructions.   Will watch your condition.   Will get help right away if you are not doing well or get worse.  Document Released: 09/17/2009 Document Revised: 06/12/2011 Document Reviewed: 09/17/2009 [facility] Patient Information 2012 [place], [place].  You have unusual pain at the groin site or down the affected leg.   You have redness, warmth, swelling, or pain at the groin site.   You have drainage (other than a small amount of blood on the dressing).   You have chills.   You have a fever or persistent symptoms for more than 72 hours.   You have a fever and your symptoms suddenly get worse.   Your leg becomes pale, cool, tingly, or numb.   You have heavy bleeding from the site. Hold pressure on the site.  Document Released: 07/26/2010 Document Revised: 06/12/2011 Document Reviewed: 07/26/2010 [facility] Patient Information 2012 [place], [place].

## 2011-12-16 NOTE — H&P (View-Only) (Signed)
[facility] [place] HISTORY AND PHYSICAL   CC: Lower extremity duplex bypass graft scan one year status post graft, ABIs, carotid duplex Referring Physician: Brabham  History of Present Illness: 61-year-old female patient of Dr. Brabham that is one year status post left common femoral to above-the-knee popliteal artery bypass graft using Gore-Tex.. The patient also is on surveillance for carotid stenosis. Patient reports no claudication "any worse than I've ever had", she also denies signs or symptoms of CVA, TIA, amaurosis fugax or any neural deficits.  Past Medical History  Diagnosis Date  . Hypertension   . COPD (chronic obstructive pulmonary disease)   . Tobacco abuse     hx of  . Coronary artery disease     PCI, DES circumflex 2/11. moderate disease LAD  . Hyperlipidemia   . PVD (peripheral vascular disease)     lower extremity runoff 05/08/10  . Ischemic cardiomyopathy     Echo 08/14/09 with LVEF 40-45%.     ROS: [x] Positive   [ ] Denies    General: [ ] Weight loss, [ ] Fever, [ ] chills Neurologic: [ ] Dizziness, [ ] Blackouts, [ ] Seizure [ ] Stroke, [ ] "Mini stroke", [ ] Slurred speech, [ ] Temporary blindness; [ ] weakness in arms or legs, [ ] Hoarseness Cardiac: [ ] Chest pain/pressure, [ ] Shortness of breath at rest [ ] Shortness of breath with exertion, [ ] Atrial fibrillation or irregular heartbeat Vascular: [ ] Pain in legs with walking, [ ] Pain in legs at rest, [ ] Pain in legs at night,  [ ] Non-healing ulcer, [ ] Blood clot in vein/DVT,   Pulmonary: [ ] Home oxygen, [ ] Productive cough, [ ] Coughing up blood, [ ] Asthma,  [ ] Wheezing Musculoskeletal:  [ ] Arthritis, [ ] Low back pain, [ ] Joint pain Hematologic: [ ] Easy Bruising, [ ] Anemia; [ ] Hepatitis Gastrointestinal: [ ] Blood in stool, [ ] Gastroesophageal Reflux/heartburn, [ ] Trouble swallowing Urinary: [ ] chronic Kidney disease, [ ] on HD - [ ] MWF or [ ] TTHS, [ ] Burning  with urination, [ ] Difficulty urinating Skin: [ ] Rashes, [ ] Wounds Psychological: [ ] Anxiety, [ ] Depression   Social History History  Substance Use Topics  . Smoking status: Former Smoker -- 1.5 packs/day for 30 years    Types: Cigarettes    Quit date: 08/12/2009  . Smokeless tobacco: Never Used   Comment: she has smoker 1-2 packs daily since the age of 19. 80 pack years  . Alcohol Use: No    Family History Family History  Problem Relation Age of Onset  . Coronary artery disease Mother     deceased. coronary artery bypass graft  . Cancer Mother     BREAST  . Heart disease Mother   . Cirrhosis Father     deceased  . Cancer Father   . Other Brother     alive, unknown hx    Allergies  Allergen Reactions  . Ace Inhibitors   . Amoxicillin-Pot Clavulanate   . Codeine     Current Outpatient Prescriptions  Medication Sig Dispense Refill  . albuterol (VENTOLIN HFA) 108 (90 BASE) MCG/ACT inhaler Inhale 2 puffs into the lungs every 6 (six) hours as needed for wheezing.  2 Inhaler  11  . aspirin 81 MG tablet Take 81 mg by mouth daily.        .   clopidogrel (PLAVIX) 75 MG tablet Take 1 tablet (75 mg total) by mouth daily.  30 tablet  6  . escitalopram (LEXAPRO) 10 MG tablet Take 1 tablet (10 mg total) by mouth daily.  30 tablet  11  . losartan (COZAAR) 100 MG tablet Take 1 tablet (100 mg total) by mouth daily.  30 tablet  11  . metoprolol (LOPRESSOR) 50 MG tablet Take 1 tablet (50 mg total) by mouth 2 (two) times daily.  60 tablet  6  . simvastatin (ZOCOR) 20 MG tablet Take 1 tablet (20 mg total) by mouth every evening.  30 tablet  11  . triamterene-hydrochlorothiazide (MAXZIDE-25) 37.5-25 MG per tablet Take 1 each (1 tablet total) by mouth daily.  30 tablet  6    Physical Examination  Filed Vitals:   11/28/11 1456  BP: 147/72  Pulse: 59  Resp:     Body mass index is 26.54 kg/(m^2).  General:  WDWN in NAD Gait: Normal HEENT: WNL Eyes: Pupils equal Pulmonary:  normal non-labored breathing , without Rales, rhonchi,  wheezing Cardiac: RRR, without  Murmurs, rubs or gallops; Abdomen: soft, NT, no masses Skin: no rashes, ulcers noted  Vascular Exam Pulses: Palpable femoral pulses bilaterally, 2+ radial pulses bilaterally, carotid pulses to auscultation, no bruits are heard.  Extremities without ischemic changes, no Gangrene , no cellulitis; no open wounds;  Musculoskeletal: no muscle wasting or atrophy   Neurologic: A&O X 3; Appropriate Affect ; SENSATION: normal; MOTOR FUNCTION:  moving all extremities equally. Speech is fluent/normal  Non-Invasive Vascular Imaging CAROTID DUPLEX 11/28/2011  Right ICA 40 - 59 % stenosis Left ICA 20 - 39 % stenosis Lower extremity bypass graft shows some elevated velocities proximal to the proximal anastomosis, the readings are varied from 312-258 at the anastomosis site. ABIs today are 0.66 on the right and biphasic, 0.84 on the left also biphasic  ASSESSMENT/PLAN: Assessment as above, I discussed findings with Dr. Chen who recommends the patient have a left lower extremity arteriogram with possible intervention. This will be scheduled with Dr. Brabham for the next one to 2 weeks and followup with Dr. Brabham be pending that procedure. The patient's in agreement with this, her questions were encouraged and answered.  Coben Godshall ANP   Clinic MD: Chen 

## 2011-12-17 ENCOUNTER — Other Ambulatory Visit: Payer: Self-pay | Admitting: *Deleted

## 2011-12-17 DIAGNOSIS — I70219 Atherosclerosis of native arteries of extremities with intermittent claudication, unspecified extremity: Secondary | ICD-10-CM

## 2011-12-22 NOTE — Procedures (Unsigned)
BYPASS GRAFT EVALUATION  INDICATION:  Peripheral vascular disease.  HISTORY: Diabetes:  No Cardiac:  CAD, MI, stent Hypertension:  Yes Smoking:  Previous Previous Surgery:  Left femoral to popliteal artery bypass graft 11/15/2010  SINGLE LEVEL ARTERIAL EXAM                              RIGHT              LEFT Brachial: Anterior tibial: Posterior tibial: Peroneal: Ankle/brachial index:        0.66               0.84  PREVIOUS ABI:  Date: 02/17/2011  RIGHT:  0.56  LEFT:  0.74  LOWER EXTREMITY BYPASS GRAFT DUPLEX EXAM:  DUPLEX: 1. Elevated velocities present which suggest 50 to 75% stenosis     involving the left distal external iliac, common femoral, and     proximal anastomosis of the left femoral-to-popliteal-artery bypass     graft. 2. Elevated velocities present in the distal left femoral-to-popliteal-     artery bypass graft anastomosis by a ratio of 1.76, suggesting 30     to 49% stenosis.  IMPRESSION: 1. Native artery stenosis present, as noted above. 2. Patent left femoral-to-popliteal-artery bypass graft with stenosis     at the proximal and distal anastomosis, as noted above. 3. Right ankle-brachial index is in the moderate claudication range,     left ankle-brachial index is in the mild claudication range.     Minimal improvement in bilateral ankle-brachial indices since study     on 02/17/2011.  ___________________________________________ V. Charlena Cross, MD  SH/MEDQ  D:  11/28/2011  T:  11/28/2011  Job:  [id number]

## 2011-12-22 NOTE — Procedures (Unsigned)
CAROTID DUPLEX EXAM  INDICATION:  Carotid stenosis.  HISTORY: Diabetes:  No. Cardiac:  CAD, MI, stent. Hypertension:  Yes. Smoking:  Previous. Previous Surgery:  No carotid interventions. CV History:  Asymptomatic. Amaurosis Fugax No, Paresthesias No, Hemiparesis No.                                      RIGHT             LEFT Brachial systolic pressure:         124               134 Brachial Doppler waveforms:         WNL               WNL Vertebral direction of flow:        Antegrade         Antegrade DUPLEX VELOCITIES (cm/sec) CCA peak systolic                   105               113 ECA peak systolic                   135               136 ICA peak systolic                   131               72 ICA end diastolic                   42                19 PLAQUE MORPHOLOGY:                  Heterogenous      Heterogenous PLAQUE AMOUNT:                      Mild to moderate  Mild PLAQUE LOCATION:                    CCA/ICA           CCA/ICA  IMPRESSION: 1. Bilateral common carotid artery disease present. 2. Right internal carotid artery stenosis present in the 40% to 59%     range. 3. Bilateral external carotid arteries appear patent. 4. Left internal carotid artery stenosis present in the 1% to 39%     range (high end of range). 5. Bilateral vertebral arteries are patent and antegrade. 6. Essentially unchanged since the previous study done on 11/07/2010.  ___________________________________________ V. Charlena Cross, MD  SH/MEDQ  D:  11/28/2011  T:  11/28/2011  Job:  [id number]

## 2012-02-03 ENCOUNTER — Other Ambulatory Visit: Payer: Self-pay | Admitting: *Deleted

## 2012-02-03 ENCOUNTER — Other Ambulatory Visit: Payer: Self-pay

## 2012-02-03 DIAGNOSIS — I2510 Atherosclerotic heart disease of native coronary artery without angina pectoris: Secondary | ICD-10-CM

## 2012-02-03 MED ORDER — METOPROLOL TARTRATE 50 MG PO TABS: 50.0000 mg | ORAL_TABLET | Freq: Two times a day (BID) | ORAL | Status: DC

## 2012-02-03 NOTE — Telephone Encounter (Signed)
An email was sent to Allison [name] from [facility]- this patient has emailed the [employer] asking for her refill. I have notified the patient this has been done.

## 2012-02-18 ENCOUNTER — Other Ambulatory Visit: Payer: Self-pay

## 2012-02-18 MED ORDER — SIMVASTATIN 20 MG PO TABS: 20.0000 mg | ORAL_TABLET | Freq: Every evening | ORAL | Status: DC

## 2012-03-02 ENCOUNTER — Ambulatory Visit (INDEPENDENT_AMBULATORY_CARE_PROVIDER_SITE_OTHER): Payer: Medicaid Other | Admitting: Cardiovascular Disease

## 2012-03-02 ENCOUNTER — Encounter: Payer: Self-pay | Admitting: Cardiovascular Disease

## 2012-03-02 VITALS — BP 136/70 | HR 62 | Resp 12 | Ht 67.0 in | Wt 170.0 lb

## 2012-03-02 DIAGNOSIS — I2510 Atherosclerotic heart disease of native coronary artery without angina pectoris: Secondary | ICD-10-CM

## 2012-03-02 DIAGNOSIS — I739 Peripheral vascular disease, unspecified: Secondary | ICD-10-CM

## 2012-03-02 MED ORDER — TRIAMTERENE-HCTZ 37.5-25 MG PO TABS: 1.0000 | ORAL_TABLET | Freq: Every day | ORAL | Status: DC

## 2012-03-02 MED ORDER — CLOPIDOGREL BISULFATE 75 MG PO TABS: 75.0000 mg | ORAL_TABLET | Freq: Every day | ORAL | Status: DC

## 2012-03-02 NOTE — Patient Instructions (Addendum)
Your physician wants you to follow-up in:  6 months. You will receive a reminder letter in the mail two months in advance. If you don't receive a letter, please call our office to schedule the follow-up appointment.   

## 2012-03-02 NOTE — Progress Notes (Signed)
History of Present Illness: 61 yo WF with history of HTN, hyperlipidemia, CAD admitted with NSTEMI, respiratory failure February 2011, PAD here today for cardiac follow up. Her PV issues are followed by Dr. Myra [facility]otti [facility].  She underwent cardiac cath in February 2011 in the setting of a NSTEMI showing severe stenosis of the Circumflex, now s/p DES to the Circumflex. Her EF was found to be 30%, presumed to be non-ischemic in etiology. Echo on 08/14/09 with EF of 40-45%. Her femoral angiogram showed a stenosis right SFA. Non-invasive studies in April 2011 with right ABI 0.93, Left ABI 0.70. There was suggestion of moderate disease in the left SFA ostium and moderate disease in the right SFA ostium. She had pain in both legs with walking 100 yards. This was described as cramping in both calf muscles. Resolved with rest. No rest pain or ulcerations. I arranged a distal aortogram with bilateral lower extremity runoff on 05/08/10. She was found to have a a severe stenosis at the ostium of the left SFA and moderate stenosis at the ostium of the right SFA. The ostium of the right profunda femoral artery had a severe stenosis. [place]k atherectomy of the left SFA ostium was performed on 06/12/10. She continued to have severe pain in her left leg following the procedure. Repeat angiography April 2012 with severe stenosis left SFA from ostium down through the distal SFA. Dr. Durene Cal evaluated the pt at my request and she had left femoral to above knee popliteal bypass on Nov 15, 2010. She did well and has been followed by Dr. Myra Gianotti for graft surveillance. Angiogram 12/16/11 with patent left femoral to popliteal artery bypass graft with 50-60% left common femoral artery stenosis. Conservative management pursued secondary to the small size of the vessel. Echo March 2013 with normal LV size and function and no significant valvular issues. Carotid artery dopplers May 2013 in [facility] with 40-59% RICA stenosis and less  than 39% LICA stenosis.   She is here today for cardiac follow up. She reports no chest pain or SOB. She is not smoking. She does not exercise.   Primary Care Physician: [facility] (Dr. Shelva Majestic)  Last Lipid Profile:  Lipid Panel     Component Value Date/Time   CHOL 148 11/25/2011 0903   TRIG 109.0 11/25/2011 0903   HDL 39.80 11/25/2011 0903   CHOLHDL 4 11/25/2011 0903   VLDL 21.8 11/25/2011 0903   LDLCALC 86 11/25/2011 0903     Past Medical History  Diagnosis Date  . Hypertension   . COPD (chronic obstructive pulmonary disease)   . Tobacco abuse     hx of  . Coronary artery disease     PCI, DES circumflex 2/11. moderate disease LAD  . Hyperlipidemia   . PVD (peripheral vascular disease)     lower extremity runoff 05/08/10  . Ischemic cardiomyopathy     Echo 08/14/09 with LVEF 40-45%.     Past Surgical History  Procedure Date  . Tonsillectomy   . Coronary stent placement     Current Outpatient Prescriptions  Medication Sig Dispense Refill  . albuterol (VENTOLIN HFA) 108 (90 BASE) MCG/ACT inhaler Inhale 2 puffs into the lungs every 6 (six) hours as needed for wheezing.  2 Inhaler  11  . aspirin 81 MG tablet Take 81 mg by mouth daily.        . clopidogrel (PLAVIX) 75 MG tablet Take 1 tablet (75 mg total) by mouth daily.  30 tablet  6  .  escitalopram (LEXAPRO) 10 MG tablet Take 1 tablet (10 mg total) by mouth daily.  30 tablet  11  . losartan (COZAAR) 100 MG tablet Take 1 tablet (100 mg total) by mouth daily.  30 tablet  11  . metoprolol (LOPRESSOR) 50 MG tablet Take 1 tablet (50 mg total) by mouth 2 (two) times daily.  60 tablet  6  . simvastatin (ZOCOR) 20 MG tablet Take 1 tablet (20 mg total) by mouth every evening.  30 tablet  11  . triamterene-hydrochlorothiazide (MAXZIDE-25) 37.5-25 MG per tablet Take 1 each (1 tablet total) by mouth daily.  30 tablet  6    Allergies  Allergen Reactions  . Ace Inhibitors   . Amoxicillin-Pot Clavulanate   . Codeine      History   Social History  . Marital Status: Married    Spouse Name: N/A    Number of Children: 3  . Years of Education: N/A   Occupational History  . sculptor    Social History Main Topics  . Smoking status: Former Smoker -- 1.5 packs/day for 30 years    Types: Cigarettes    Quit date: 08/12/2009  . Smokeless tobacco: Never Used   Comment: she has smoker 1-2 packs daily since the age of 84. 80 pack years  . Alcohol Use: No  . Drug Use: No  . Sexually Active: Not on file   Other Topics Concern  . Not on file   Social History Narrative  . No narrative on file    Family History  Problem Relation Age of Onset  . Coronary artery disease Mother     deceased. coronary artery bypass graft  . Cancer Mother     BREAST  . Heart disease Mother   . Cirrhosis Father     deceased  . Cancer Father   . Other Brother     alive, unknown hx    Review of Systems:  As stated in the HPI and otherwise negative.   BP 136/70  Pulse 62  Resp 12  Ht 5\' 7"  (1.702 m)  Wt 170 lb (77.111 kg)  BMI 26.63 kg/m2  Physical Examination: General: Well developed, well nourished, NAD HEENT: OP clear, mucus membranes moist SKIN: warm, dry. No rashes. Neuro: No focal deficits Musculoskeletal: Muscle strength 5/5 all ext Psychiatric: Mood and affect normal Neck: No JVD, no carotid bruits, no thyromegaly, no lymphadenopathy. Lungs:Clear bilaterally, no wheezes, rhonci, crackles Cardiovascular: Regular rate and rhythm. No murmurs, gallops or rubs. Abdomen:Soft. Bowel sounds present. Non-tender.  Extremities: No lower extremity edema. Pulses are trace in the bilateral DP/PT.  Echo 09/16/11:  Left ventricle: The cavity size was normal. Wall thickness was normal. Systolic function was normal. The estimated ejection fraction was in the range of 55% to 60%. Wall motion was normal; there were no regional wall motion abnormalities. Left ventricular diastolic function parameters were normal. -  Aortic valve: Mild regurgitation. - Mitral valve: Calcified annulus. Mild regurgitation. - Left atrium: The atrium was mildly dilated. - Atrial septum: No defect or patent foramen ovale was identified. - Pulmonary arteries: PA peak pressure: 37mm Hg (S).   Assessment and Plan:   1. CAD:  Stable. No chest pain. No change in breathing. BP is well controlled. Lipids are at goal. No changes today. Continue ASA/Plavix/statin/beta blocker/ARB. She is asked to begin daily exercise. Continue heart healthy diet.   2. PAD: Stable. Followed in [facility] by Dr. Myra Gianotti. Surveillance ABI/graft studies in [facility].   3. Carotid artery  disease: Followed in [facility], mild bilateral disease.

## 2012-03-19 ENCOUNTER — Encounter: Payer: Self-pay | Admitting: Surgery

## 2012-03-22 ENCOUNTER — Encounter: Payer: Self-pay | Admitting: Surgery

## 2012-03-22 ENCOUNTER — Encounter (INDEPENDENT_AMBULATORY_CARE_PROVIDER_SITE_OTHER): Payer: Medicaid Other | Admitting: *Deleted

## 2012-03-22 ENCOUNTER — Ambulatory Visit (INDEPENDENT_AMBULATORY_CARE_PROVIDER_SITE_OTHER): Payer: Medicaid Other | Admitting: Surgery

## 2012-03-22 VITALS — BP 125/46 | HR 64 | Resp 16 | Ht 67.0 in | Wt 172.0 lb

## 2012-03-22 DIAGNOSIS — I70219 Atherosclerosis of native arteries of extremities with intermittent claudication, unspecified extremity: Secondary | ICD-10-CM

## 2012-03-22 DIAGNOSIS — I739 Peripheral vascular disease, unspecified: Secondary | ICD-10-CM

## 2012-03-22 DIAGNOSIS — Z48812 Encounter for surgical aftercare following surgery on the circulatory system: Secondary | ICD-10-CM

## 2012-03-22 NOTE — Progress Notes (Signed)
[facility] [place]   Patient name: Allison [name] MRN: [id number] DOB: October 26, 1950 Sex: female     Chief Complaint  Patient presents with  . PVD    3 month f/u with duplex    HISTORY OF PRESENT ILLNESS: The patient is back today for followup. She is status post left common femoral to above-knee popliteal artery bypass graft with 6 mm Gore-Tex. This was performed in May of 2012 secondary to severe claudication. She was followed with surveillance ultrasound and found to have inflow stenosis. On June 11 she underwent angiography. At that time approximately 50-60% stenosis was identified in the left common femoral artery, proximal to the origin of the bypass graft. No intervention was done. The patient is back today for routine surveillance. She denies any ulceration or leg. She does not have symptoms of claudication.  Past Medical History  Diagnosis Date  . Hypertension   . COPD (chronic obstructive pulmonary disease)   . Tobacco abuse     hx of  . Coronary artery disease     PCI, DES circumflex 2/11. moderate disease LAD  . Hyperlipidemia   . PVD (peripheral vascular disease)     lower extremity runoff 05/08/10  . Ischemic cardiomyopathy     Echo 08/14/09 with LVEF 40-45%.     Past Surgical History  Procedure Date  . Tonsillectomy   . Coronary stent placement     History   Social History  . Marital Status: Married    Spouse Name: N/A    Number of Children: 3  . Years of Education: N/A   Occupational History  . sculptor    Social History Main Topics  . Smoking status: Former Smoker -- 1.5 packs/day for 30 years    Types: Cigarettes    Quit date: 08/12/2009  . Smokeless tobacco: Never Used   Comment: she has smoker 1-2 packs daily since the age of 55. 80 pack years  . Alcohol Use: No  . Drug Use: No  . Sexually Active: Not on file   Other Topics Concern  . Not on file   Social History Narrative  . No narrative on file    Family History    Problem Relation Age of Onset  . Coronary artery disease Mother     deceased. coronary artery bypass graft  . Cancer Mother     BREAST  . Heart disease Mother   . Cirrhosis Father     deceased  . Cancer Father   . Other Brother     alive, unknown hx    Allergies as of 03/22/2012 - Review Complete 03/22/2012  Allergen Reaction Noted  . Ace inhibitors    . Amoxicillin-pot clavulanate    . Codeine      Current Outpatient Prescriptions on File Prior to Visit  Medication Sig Dispense Refill  . albuterol (VENTOLIN HFA) 108 (90 BASE) MCG/ACT inhaler Inhale 2 puffs into the lungs every 6 (six) hours as needed for wheezing.  2 Inhaler  11  . aspirin 81 MG tablet Take 81 mg by mouth daily.        . clopidogrel (PLAVIX) 75 MG tablet Take 1 tablet (75 mg total) by mouth daily.  30 tablet  11  . escitalopram (LEXAPRO) 10 MG tablet Take 1 tablet (10 mg total) by mouth daily.  30 tablet  11  . losartan (COZAAR) 100 MG tablet Take 1 tablet (100 mg total) by mouth daily.  30 tablet  11  .  metoprolol (LOPRESSOR) 50 MG tablet Take 1 tablet (50 mg total) by mouth 2 (two) times daily.  60 tablet  6  . simvastatin (ZOCOR) 20 MG tablet Take 1 tablet (20 mg total) by mouth every evening.  30 tablet  11  . triamterene-hydrochlorothiazide (MAXZIDE-25) 37.5-25 MG per tablet Take 1 each (1 tablet total) by mouth daily.  30 tablet  11     REVIEW OF SYSTEMS: No changes from prior visit  PHYSICAL EXAMINATION:   Vital signs are BP 125/46  Pulse 64  Resp 16  Ht 5\' 7"  (1.702 m)  Wt 172 lb (78.019 kg)  BMI 26.94 kg/m2  SpO2 100% General: The patient appears their stated age. HEENT:  No gross abnormalities Pulmonary:  Non labored breathing Musculoskeletal: There are no major deformities. Neurologic: No focal weakness or paresthesias are detected, Skin: There are no ulcer or rashes noted. Psychiatric: The patient has normal affect. Cardiovascular: There is a regular rate and rhythm without  significant murmur appreciated. No carotid bruits   Diagnostic Studies Duplex ultrasound was ordered and reviewed today. Ankle-brachial index of left today is 0.63. There are elevated velocities in the common femoral artery. Peak velocity is 339 cm/s.  Assessment: Status post left femoral-popliteal bypass graft Carotid artery occlusive disease Plan: #1: There has been minimal change in the velocity profile within the left common femoral artery. The patient remained asymptomatic. Because of the location of the stenosis I recommended continued surveillance. She will come back in 6 months for a repeat ultrasound. #2: The patient is followed with surveillance carotid duplex for her moderate asymptomatic stenosis  V. Charlena Cross, M.D. [facility] [place] Office: [phone] Pager:  [phone]

## 2012-05-27 ENCOUNTER — Other Ambulatory Visit: Payer: Self-pay | Admitting: *Deleted

## 2012-05-27 DIAGNOSIS — I739 Peripheral vascular disease, unspecified: Secondary | ICD-10-CM

## 2012-08-02 ENCOUNTER — Other Ambulatory Visit: Payer: Self-pay | Admitting: Cardiovascular Disease

## 2012-08-02 DIAGNOSIS — I2510 Atherosclerotic heart disease of native coronary artery without angina pectoris: Secondary | ICD-10-CM

## 2012-08-02 MED ORDER — SIMVASTATIN 20 MG PO TABS: 20.0000 mg | ORAL_TABLET | Freq: Every evening | ORAL | Status: DC

## 2012-08-02 MED ORDER — CLOPIDOGREL BISULFATE 75 MG PO TABS: 75.0000 mg | ORAL_TABLET | Freq: Every day | ORAL | Status: DC

## 2012-08-02 NOTE — Telephone Encounter (Signed)
New Problem      Refill Request Plavix 75 ml Simvastatine 20 ml Lexapro 10 ml  [facility] [place] [facility] [phone]

## 2012-08-04 MED ORDER — ESCITALOPRAM OXALATE 10 MG PO TABS: 10.0000 mg | ORAL_TABLET | Freq: Every day | ORAL | Status: DC

## 2012-08-04 NOTE — Telephone Encounter (Signed)
Will refill for one month. Additional refills will be discussed at office visit with Dr. Clifton James on Feb. 26. 2014

## 2012-08-04 NOTE — Telephone Encounter (Signed)
Lexapro refilled for 1 month. Plavix and Zocor refilled for 6 months.

## 2012-09-01 ENCOUNTER — Ambulatory Visit: Payer: Medicaid Other | Admitting: Cardiovascular Disease

## 2012-09-03 ENCOUNTER — Ambulatory Visit (INDEPENDENT_AMBULATORY_CARE_PROVIDER_SITE_OTHER): Payer: Medicaid Other | Admitting: Cardiovascular Disease

## 2012-09-03 ENCOUNTER — Encounter: Payer: Self-pay | Admitting: Cardiovascular Disease

## 2012-09-03 VITALS — BP 130/70 | HR 60 | Ht 67.0 in | Wt 170.0 lb

## 2012-09-03 DIAGNOSIS — J449 Chronic obstructive pulmonary disease, unspecified: Secondary | ICD-10-CM

## 2012-09-03 DIAGNOSIS — J4489 Other specified chronic obstructive pulmonary disease: Secondary | ICD-10-CM

## 2012-09-03 DIAGNOSIS — I2510 Atherosclerotic heart disease of native coronary artery without angina pectoris: Secondary | ICD-10-CM

## 2012-09-03 DIAGNOSIS — E785 Hyperlipidemia, unspecified: Secondary | ICD-10-CM

## 2012-09-03 MED ORDER — LOSARTAN POTASSIUM 100 MG PO TABS: 100.0000 mg | ORAL_TABLET | Freq: Every day | ORAL | Status: DC

## 2012-09-03 MED ORDER — ALBUTEROL SULFATE HFA 108 (90 BASE) MCG/ACT IN AERS: 2.0000 | INHALATION_SPRAY | Freq: Four times a day (QID) | RESPIRATORY_TRACT | Status: DC | PRN

## 2012-09-03 MED ORDER — ESCITALOPRAM OXALATE 10 MG PO TABS: 10.0000 mg | ORAL_TABLET | Freq: Every day | ORAL | Status: DC

## 2012-09-03 MED ORDER — METOPROLOL TARTRATE 50 MG PO TABS: 50.0000 mg | ORAL_TABLET | Freq: Two times a day (BID) | ORAL | Status: DC

## 2012-09-03 NOTE — Progress Notes (Signed)
History of Present Illness: 62 yo WF with history of HTN, hyperlipidemia, CAD with prior NSTEMI, respiratory failure February 2011, PAD here today for cardiac follow up. Her PV issues are followed by Dr. Myra [facility]otti with [facility]. She underwent cardiac cath in February 2011 in the setting of a NSTEMI showing severe stenosis of the Circumflex, now s/p DES to the Circumflex. Her EF was found to be 30%, presumed to be non-ischemic in etiology. Echo on 08/14/09 with EF of 40-45%. Her femoral angiogram showed a stenosis right SFA. Non-invasive studies in April 2011 with right ABI 0.93, Left ABI 0.70. There was suggestion of moderate disease in the left SFA ostium and moderate disease in the right SFA ostium. She had pain in both legs with walking 100 yards. This was described as cramping in both calf muscles. Resolved with rest. No rest pain or ulcerations. I arranged a distal aortogram with bilateral lower extremity runoff on 05/08/10. She was found to have a a severe stenosis at the ostium of the left SFA and moderate stenosis at the ostium of the right SFA. The ostium of the right profunda femoral artery had a severe stenosis. [place] atherectomy of the left SFA ostium was performed on 06/12/10. She continued to have severe pain in her left leg following the procedure. Repeat angiography April 2012 with severe stenosis left SFA from ostium down through the distal SFA. Dr. Durene Cal evaluated her and she had left femoral to above knee popliteal bypass on Nov 15, 2010. She did well and has been followed by Dr. Myra Gianotti for graft surveillance. Angiogram 12/16/11 with patent left femoral to popliteal artery bypass graft with 50-60% left common femoral artery stenosis. Conservative management pursued secondary to the small size of the vessel. Echo March 2013 with normal LV size and function and no significant valvular issues. Carotid artery dopplers May 2013 in [facility] with 40-59% RICA stenosis and less than 39% LICA stenosis.     She is here today for cardiac follow up. She reports no chest pain. She is dyspneic when climbing stairs and with any moderate exertion. She has COPD but she only has a rescue inhaler. Her legs feel great. She is not smoking.  Primary Care Physician: [facility] (Dr. Shelva Majestic)  Last Lipid Profile:Lipid Panel     Component Value Date/Time   CHOL 148 11/25/2011 0903   TRIG 109.0 11/25/2011 0903   HDL 39.80 11/25/2011 0903   CHOLHDL 4 11/25/2011 0903   VLDL 21.8 11/25/2011 0903   LDLCALC 86 11/25/2011 0903     Past Medical History  Diagnosis Date  . Hypertension   . COPD (chronic obstructive pulmonary disease)   . Tobacco abuse     hx of  . Coronary artery disease     PCI, DES circumflex 2/11. moderate disease LAD  . Hyperlipidemia   . PVD (peripheral vascular disease)     lower extremity runoff 05/08/10  . Ischemic cardiomyopathy     Echo 08/14/09 with LVEF 40-45%.     Past Surgical History  Procedure Laterality Date  . Tonsillectomy    . Coronary stent placement      Current Outpatient Prescriptions  Medication Sig Dispense Refill  . albuterol (PROVENTIL HFA;VENTOLIN HFA) 108 (90 BASE) MCG/ACT inhaler Inhale 2 puffs into the lungs every 6 (six) hours as needed for wheezing.      Marland Kitchen aspirin 81 MG tablet Take 81 mg by mouth daily.        . clopidogrel (PLAVIX) 75 MG  tablet Take 1 tablet (75 mg total) by mouth daily.  30 tablet  5  . escitalopram (LEXAPRO) 10 MG tablet Take 1 tablet (10 mg total) by mouth daily.  30 tablet  0  . losartan (COZAAR) 100 MG tablet Take 1 tablet (100 mg total) by mouth daily.  30 tablet  11  . metoprolol (LOPRESSOR) 50 MG tablet Take 1 tablet (50 mg total) by mouth 2 (two) times daily.  60 tablet  6  . simvastatin (ZOCOR) 20 MG tablet Take 1 tablet (20 mg total) by mouth every evening.  30 tablet  5  . triamterene-hydrochlorothiazide (MAXZIDE-25) 37.5-25 MG per tablet Take 1 each (1 tablet total) by mouth daily.  30 tablet  11   No current  facility-administered medications for this visit.    Allergies  Allergen Reactions  . Ace Inhibitors   . Amoxicillin-Pot Clavulanate   . Codeine     History   Social History  . Marital Status: Married    Spouse Name: N/A    Number of Children: 3  . Years of Education: N/A   Occupational History  . sculptor    Social History Main Topics  . Smoking status: Former Smoker -- 1.50 packs/day for 30 years    Types: Cigarettes    Quit date: 08/12/2009  . Smokeless tobacco: Never Used     Comment: she has smoker 1-2 packs daily since the age of 9. 80 pack years  . Alcohol Use: No  . Drug Use: No  . Sexually Active: Not on file   Other Topics Concern  . Not on file   Social History Narrative  . No narrative on file    Family History  Problem Relation Age of Onset  . Coronary artery disease Mother     deceased. coronary artery bypass graft  . Cancer Mother     BREAST  . Heart disease Mother   . Cirrhosis Father     deceased  . Cancer Father   . Other Brother     alive, unknown hx    Review of Systems:  As stated in the HPI and otherwise negative.   BP 130/70  Pulse 60  Ht 5\' 7"  (1.702 m)  Wt 170 lb (77.111 kg)  BMI 26.62 kg/m2  Physical Examination: General: Well developed, well nourished, NAD HEENT: OP clear, mucus membranes moist SKIN: warm, dry. No rashes. Neuro: No focal deficits Musculoskeletal: Muscle strength 5/5 all ext Psychiatric: Mood and affect normal Neck: No JVD, no carotid bruits, no thyromegaly, no lymphadenopathy. Lungs:Clear bilaterally, no wheezes, rhonci, crackles Cardiovascular: Regular rate and rhythm. No murmurs, gallops or rubs. Abdomen:Soft. Bowel sounds present. Non-tender.  Extremities: No lower extremity edema. Pulses are 2 + in the right DP/PT and 1+ in the left DP/PT  YQM:VHQIO, rate 61 bpm. PVC  Assessment and Plan:   1. CAD: Stable. Will continue current medical therapy. With recent c/o worsened dyspnea, will arrange  Lexiscan stress myoview to exclude ischemia. She is known to have moderate LAD and RCA disease by cath in 2011. No ischemic testing since then.   2. PAD: Followed in [facility]  3. Hyperlipidemia: Continue statin. Check lipids and LFTs this summer.  4. COPD: Will refer to [facility] clinic for evaluation and management.

## 2012-09-03 NOTE — Patient Instructions (Addendum)
Your physician wants you to follow-up in:  6 months.  You will receive a reminder letter in the mail two months in advance. If you don't receive a letter, please call our office to schedule the follow-up appointment.  Your physician has requested that you have a lexiscan myoview. For further information please visit [email or web address] Please follow instruction sheet, as given.   Your physician recommends that you return for fasting lab work  The end of May--Lipid and Liver profiles   You have been referred to  Dr. Marchelle Gearing (pulmonary)

## 2012-09-06 ENCOUNTER — Other Ambulatory Visit: Payer: Self-pay | Admitting: *Deleted

## 2012-09-06 DIAGNOSIS — I739 Peripheral vascular disease, unspecified: Secondary | ICD-10-CM

## 2012-09-09 ENCOUNTER — Encounter (HOSPITAL_COMMUNITY): Payer: Medicaid Other

## 2012-09-17 ENCOUNTER — Encounter: Payer: Self-pay | Admitting: Neurosurgery

## 2012-09-20 ENCOUNTER — Ambulatory Visit: Payer: Medicaid Other | Admitting: Neurosurgery

## 2012-09-29 ENCOUNTER — Ambulatory Visit (INDEPENDENT_AMBULATORY_CARE_PROVIDER_SITE_OTHER): Payer: Medicaid Other | Admitting: Internal Medicine

## 2012-09-29 ENCOUNTER — Encounter: Payer: Self-pay | Admitting: Internal Medicine

## 2012-09-29 VITALS — BP 132/70 | HR 76 | Ht 67.0 in | Wt 179.2 lb

## 2012-09-29 DIAGNOSIS — R0602 Shortness of breath: Secondary | ICD-10-CM

## 2012-09-29 DIAGNOSIS — J449 Chronic obstructive pulmonary disease, unspecified: Secondary | ICD-10-CM

## 2012-09-29 DIAGNOSIS — J4489 Other specified chronic obstructive pulmonary disease: Secondary | ICD-10-CM

## 2012-09-29 MED ORDER — PREDNISONE 10 MG PO TABS: ORAL_TABLET | ORAL | Status: DC

## 2012-09-29 NOTE — Progress Notes (Signed)
Subjective:    Patient ID: Allison [name], female    DOB: 03-06-1951, 62 y.o.   MRN: [id number]  HPI pcp is Forest Gleason, MD   IOV 09/29/2012 Referred by Dr. Melene Muller of cardiology for dyspnea   62 year old female. Accompanied by her husband. Greater than 40 pack smoker quit in 2011 following myocardial infarction status post cardiac stent x1. She recollects having insidious onset dyspnea few to several years even before myocardial infarction. At the time of  MI, she had acute onset of dyspnea and had a cardiac stent after that her dyspnea significantly improved. Around this time was also diagnosed with "moderate COPD" based on spirometry at [facility]. She was on Spiriva but following diagnoses of myocardial infarction she stopped it because she also had pneumonia at the same time. Post cardiac stent dyspnea improved but over the last year she's having insidious worsening of dyspnea on exertion. Dyspnea is now brought on for activities like laundry and yard work and moving things in the house. Dyspnea is always relieved by rest. There is no associated chest pain, hemoptysis, claudication, orthopnea, paroxysmal nocturnal dyspnea.. Her cardiac ejection fraction as of 2013 is normal. Of note since she had cardiac stent she has not had a stress test but currently a stress test is planned. However cardiology feels she needs  COPD management optimized and therefore referred. Currently COPD cat score is 17 and only copd med is albuterol as needed  Walk test in the office 185 feet x3 laps: She was only able to complete 2 laps and stop because of dyspnea. Her lowest pulse ox was 97% and a highest heart rate was 108 per minute. Premature termination of sub maximal exercise due to dyspnea. Spirometry is Gold stage IV COPD with FEV1 0.76 L/28%. With FEV1/FVC ratio of 48.       Dyspnea relevant history  - Smoking: 42 pack smoker quit 2011 - Cardiac history  - Left ventricle ejection  fraction in 2011 was between 40 and 45%. This had improved in March 2013 to  55% - Chest x-ray 11/12/2010  - Reported as COPD changes - Hemoglobin June 2013 is 12.6 g percent with a creatinine of 1.3 mg percent   CAT COPD Symptom & Quality of Life Score (GSK trademark) 0 is no burden. 5 is highest burden 09/29/2012   Never Cough -> Cough all the time 2  No phlegm in chest -> Chest is full of phlegm 2  No chest tightness -> Chest feels very tight 0  No dyspnea for 1 flight stairs/hill -> Very dyspneic for 1 flight of stairs 4  No limitations for ADL at home -> Very limited with ADL at home 4  Confident leaving home -> Not at all confident leaving home 1  Sleep soundly -> Do not sleep soundly because of lung condition 1  Lots of Energy -> No energy at all 3  TOTAL Score (max 40)  17       Past Medical History  Diagnosis Date  . Hypertension   . COPD (chronic obstructive pulmonary disease)   . Tobacco abuse     hx of  . Coronary artery disease     PCI, DES circumflex 2/11. moderate disease LAD  . Hyperlipidemia   . PVD (peripheral vascular disease)     lower extremity runoff 05/08/10  . Ischemic cardiomyopathy     Echo 08/14/09 with LVEF 40-45%.      Family History  Problem Relation Age of Onset  .  Coronary artery disease Mother     deceased. coronary artery bypass graft  . Cancer Mother     BREAST  . Heart disease Mother   . Cirrhosis Father     deceased  . Cancer Father   . Other Brother     alive, unknown hx     History   Social History  . Marital Status: Married    Spouse Name: N/A    Number of Children: 3  . Years of Education: N/A   Occupational History  . sculptor    Social History Main Topics  . Smoking status: Former Smoker -- 1.50 packs/day for 30 years    Types: Cigarettes    Quit date: 08/12/2009  . Smokeless tobacco: Never Used     Comment: she has smoker 1-2 packs daily since the age of 52. 80 pack years  . Alcohol Use: No  . Drug Use: No   . Sexually Active: Not on file   Other Topics Concern  . Not on file   Social History Narrative  . No narrative on file     Allergies  Allergen Reactions  . Ace Inhibitors   . Amoxicillin-Pot Clavulanate   . Codeine      Outpatient Prescriptions Prior to Visit  Medication Sig Dispense Refill  . albuterol (PROVENTIL HFA;VENTOLIN HFA) 108 (90 BASE) MCG/ACT inhaler Inhale 2 puffs into the lungs every 6 (six) hours as needed for wheezing.  2 Inhaler  6  . aspirin 81 MG tablet Take 81 mg by mouth daily.        . clopidogrel (PLAVIX) 75 MG tablet Take 1 tablet (75 mg total) by mouth daily.  30 tablet  5  . escitalopram (LEXAPRO) 10 MG tablet Take 1 tablet (10 mg total) by mouth daily.  30 tablet  11  . losartan (COZAAR) 100 MG tablet Take 1 tablet (100 mg total) by mouth daily.  30 tablet  11  . metoprolol (LOPRESSOR) 50 MG tablet Take 1 tablet (50 mg total) by mouth 2 (two) times daily.  60 tablet  11  . simvastatin (ZOCOR) 20 MG tablet Take 1 tablet (20 mg total) by mouth every evening.  30 tablet  5  . triamterene-hydrochlorothiazide (MAXZIDE-25) 37.5-25 MG per tablet Take 1 each (1 tablet total) by mouth daily.  30 tablet  11   No facility-administered medications prior to visit.       Review of Systems  Constitutional: Negative for fever and unexpected weight change.  HENT: Positive for sore throat. Negative for ear pain, nosebleeds, congestion, rhinorrhea, sneezing, trouble swallowing, dental problem, postnasal drip and sinus pressure.   Eyes: Negative for redness and itching.  Respiratory: Positive for shortness of breath. Negative for cough, chest tightness and wheezing.   Cardiovascular: Positive for palpitations. Negative for leg swelling.  Gastrointestinal: Negative for nausea and vomiting.  Genitourinary: Negative for dysuria.  Musculoskeletal: Negative for joint swelling.  Skin: Negative for rash.  Neurological: Negative for headaches.  Hematological: Does not  bruise/bleed easily.  Psychiatric/Behavioral: Negative for dysphoric mood. The patient is nervous/anxious.        Objective:   Physical Exam  Vitals reviewed. Constitutional: She is oriented to person, place, and time. She appears well-developed and well-nourished. No distress.  mildy deconditioned looking  HENT:  Head: Normocephalic and atraumatic.  Right Ear: External ear normal.  Left Ear: External ear normal.  Mouth/Throat: Oropharynx is clear and moist. No oropharyngeal exudate.  Eyes: Conjunctivae and EOM are normal. Pupils  are equal, round, and reactive to light. Right eye exhibits no discharge. Left eye exhibits no discharge. No scleral icterus.  Neck: Normal range of motion. Neck supple. No JVD present. No tracheal deviation present. No thyromegaly present.  Cardiovascular: Normal rate, regular rhythm, normal heart sounds and intact distal pulses.  Exam reveals no gallop and no friction rub.   No murmur heard. Pulmonary/Chest: Effort normal and breath sounds normal. No respiratory distress. She has no wheezes. She has no rales. She exhibits no tenderness.  Abdominal: Soft. Bowel sounds are normal. She exhibits no distension and no mass. There is no tenderness. There is no rebound and no guarding.  Musculoskeletal: Normal range of motion. She exhibits no edema and no tenderness.  Lymphadenopathy:    She has no cervical adenopathy.  Neurological: She is alert and oriented to person, place, and time. She has normal reflexes. No cranial nerve deficit. She exhibits normal muscle tone. Coordination normal.  Skin: Skin is warm and dry. No rash noted. She is not diaphoretic. No erythema. No pallor.  Psychiatric: She has a normal mood and affect. Her behavior is normal. Judgment and thought content normal.          Assessment & Plan:

## 2012-09-29 NOTE — Patient Instructions (Addendum)
You appeared to have severe COPD Please start spiriva 1 puff daily - take sample, script and show technique, okay to restart; counseled her about the benefits Please start symbicort 80/4.5 2 puff twice daily - take sample, script and show technique Use albuterol 2 puffs as needed Take prednisone 40 mg daily x 2 days, then 20mg  daily x 2 days, then 10mg  daily x 2 days, then 5mg  daily x 2 days and stop Return to see me in 1 month with full pulmonary function test COPD cat score at followup AT followup I will check alpha 1, discuss rehab and vaccination

## 2012-10-01 ENCOUNTER — Encounter: Payer: Self-pay | Admitting: Surgery

## 2012-10-02 NOTE — Assessment & Plan Note (Addendum)
You appeared to have very severe COPD Please start spiriva 1 puff daily - take sample, script and show technique, okay to restart - counseled her about benefits Please start symbicort 80/4.5 2 puff twice daily - take sample, script and show technique Use albuterol 2 puffs as needed Take prednisone 40 mg daily x 2 days, then 20mg  daily x 2 days, then 10mg  daily x 2 days, then 5mg  daily x 2 days and stop Return to see me in 1 month with full pulmonary function test COPD cat score at followup At fu will discuss alpha 1, rehab, vaccination

## 2012-10-04 ENCOUNTER — Encounter: Payer: Self-pay | Admitting: Surgery

## 2012-10-04 ENCOUNTER — Ambulatory Visit (INDEPENDENT_AMBULATORY_CARE_PROVIDER_SITE_OTHER): Payer: Self-pay | Admitting: Surgery

## 2012-10-04 VITALS — BP 100/70 | HR 68 | Resp 16 | Ht 67.0 in | Wt 176.0 lb

## 2012-10-04 DIAGNOSIS — I739 Peripheral vascular disease, unspecified: Secondary | ICD-10-CM

## 2012-10-04 NOTE — Progress Notes (Signed)
Vascular and Vein Specialist of    Patient name: Allison [name] MRN: [id number] DOB: 09/10/1950 Sex: female     Chief Complaint  Patient presents with  . PVD    6 mo F/up  C/O Medicaid would not pay for Vascular Lab study.    HISTORY OF PRESENT ILLNESS: The patient is back today for followup. She is status post left femoral to above-knee popliteal artery bypass graft with 6 mm Gore-Tex on 11/14/2010 4 left leg claudication. 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 Her angiogram was in June of 2013. This showed approximately a 50-60% stenosis above the origin of the bypass graft. We are following her for this. Medicaid will not allow her to have more the 1 ultrasound per year. She is without symptoms. She denies claudication.  Past Medical History  Diagnosis Date  . Hypertension   . COPD (chronic obstructive pulmonary disease)   . Tobacco abuse     hx of  . Coronary artery disease     PCI, DES circumflex 2/11. moderate disease LAD  . Hyperlipidemia   . PVD (peripheral vascular disease)     lower extremity runoff 05/08/10  . Ischemic cardiomyopathy     Echo 08/14/09 with LVEF 40-45%.     Past Surgical History  Procedure Laterality Date  . Tonsillectomy    . Coronary stent placement    . Femoral bypass Left May 2012  . Lower extremity angiogram  December 16, 2011    History   Social History  . Marital Status: Married    Spouse Name: N/A    Number of Children: 3  . Years of Education: N/A   Occupational History  . sculptor    Social History Main Topics  . Smoking status: Former Smoker -- 1.50 packs/day for 30 years    Types: Cigarettes    Quit date: 08/12/2009  . Smokeless tobacco: Never Used     Comment: she has smoker 1-2 packs daily since the age of 73. 80 pack years  . Alcohol Use: No  . Drug Use: No  . Sexually Active: Not on file   Other Topics Concern  . Not on file   Social History Narrative  . No narrative on file     Family History  Problem Relation Age of Onset  . Coronary artery disease Mother     deceased. coronary artery bypass graft  . Cancer Mother     BREAST  . Heart disease Mother     PVD  - Amputation-Leg  . Hyperlipidemia Mother   . Hypertension Mother   . Heart attack Mother   . Cirrhosis Father     deceased  . Cancer Father   . Other Brother     alive, unknown hx    Allergies as of 10/04/2012 - Review Complete 10/04/2012  Allergen Reaction Noted  . Ace inhibitors    . Amoxicillin-pot clavulanate    . Codeine      Current Outpatient Prescriptions on File Prior to Visit  Medication Sig Dispense Refill  . albuterol (PROVENTIL HFA;VENTOLIN HFA) 108 (90 BASE) MCG/ACT inhaler Inhale 2 puffs into the lungs every 6 (six) hours as needed for wheezing.  2 Inhaler  6  . aspirin 81 MG tablet Take 81 mg by mouth daily.        . clopidogrel (PLAVIX) 75 MG tablet Take 1 tablet (75 mg total) by mouth daily.  30 tablet  5  . escitalopram (LEXAPRO)  10 MG tablet Take 1 tablet (10 mg total) by mouth daily.  30 tablet  11  . losartan (COZAAR) 100 MG tablet Take 1 tablet (100 mg total) by mouth daily.  30 tablet  11  . metoprolol (LOPRESSOR) 50 MG tablet Take 1 tablet (50 mg total) by mouth 2 (two) times daily.  60 tablet  11  . predniSONE (DELTASONE) 10 MG tablet 4 tabs daily x 2 days, then 2 tabs daily x 2 days, then 1 tab daily x 2 days, then 1/2 tab daily x 2 days, and stop.  15 tablet  0  . simvastatin (ZOCOR) 20 MG tablet Take 1 tablet (20 mg total) by mouth every evening.  30 tablet  5  . triamterene-hydrochlorothiazide (MAXZIDE-25) 37.5-25 MG per tablet Take 1 each (1 tablet total) by mouth daily.  30 tablet  11   No current facility-administered medications on file prior to visit.     REVIEW OF SYSTEMS: Please see history of present illness, was noted changes  PHYSICAL EXAMINATION:   Vital signs are BP 100/70  Pulse 68  Resp 16  Ht 5\' 7"  (1.702 m)  Wt 176 lb (79.833 kg)  BMI  27.56 kg/m2  SpO2 98% General: The patient appears their stated age. HEENT:  No gross abnormalities Pulmonary:  Non labored breathing Musculoskeletal: There are no major deformities. Neurologic: No focal weakness or paresthesias are detected, Skin: There are no ulcer or rashes noted. Psychiatric: The patient has normal affect. Cardiovascular: There is a regular rate and rhythm without significant murmur appreciated. Palpable pedal pulses   Diagnostic Studies None performed today  Assessment: Left leg claudication Plan: We discussed the signs and symptoms of a bypass graft occlusion and what to do should they occur. The patient will continue with ultrasound surveillance. Her next study will be in 6 months. I will need to pay close attention to the stenosis in the common femoral artery which is proximal to her bypass graft. A stenosis in this area could potentially prevent her bypass graft however at angiography it did not seem significant enough to repair.  [name], M.D. [facility] [place] Office: [phone] Pager:  [phone]

## 2012-10-04 NOTE — Addendum Note (Signed)
Addended by: Adria Dill L on: 10/04/2012 04:43 PM   Modules accepted: Orders

## 2012-10-20 ENCOUNTER — Telehealth: Payer: Self-pay | Admitting: Internal Medicine

## 2012-10-20 MED ORDER — BUDESONIDE-FORMOTEROL FUMARATE 80-4.5 MCG/ACT IN AERO: 2.0000 | INHALATION_SPRAY | Freq: Two times a day (BID) | RESPIRATORY_TRACT | Status: DC

## 2012-10-20 MED ORDER — TIOTROPIUM BROMIDE MONOHYDRATE 18 MCG IN CAPS: 18.0000 ug | ORAL_CAPSULE | Freq: Every day | RESPIRATORY_TRACT | Status: DC

## 2012-10-20 NOTE — Telephone Encounter (Signed)
Rx's sent based on last OV notes and patient is aware. Nothing more needed at this time.

## 2012-10-25 ENCOUNTER — Telehealth: Payer: Self-pay | Admitting: Internal Medicine

## 2012-10-25 NOTE — Telephone Encounter (Signed)
Patient called and is requesting verification from MR on what strength Symbicort she is to be taking.  Per OV Note 09/29/12  You appeared to have severe COPD  Please start spiriva 1 puff daily - take sample, script and show technique, okay to restart; counseled her about the benefits  Please start symbicort 80/4.5 2 puff twice daily - take sample, script and show technique  Use albuterol 2 puffs as needed  Take prednisone 40 mg daily x 2 days, then 20mg  daily x 2 days, then 10mg  daily x 2 days, then 5mg  daily x 2 days and stop  Return to see me in 1 month with full pulmonary function test  COPD cat score at followup  AT followup I will check alpha 1, discuss rehab and vaccination  I explained this to the patient--the Symbicort 80 dose is correct---she states that she was given Symbicort 160 samples when here in the office and then her Rx sent into the pharm was for the 80.  Pt aware that she may not get a response until the morning d/t MR not being on office this afternoon.   MR please advise which dose the pt is to be taking.

## 2012-10-25 NOTE — Telephone Encounter (Signed)
Not a big deal to use the 160 dose sample for 2 weeks, but if tshe wants she can come and pick up a  80//4.5 sample   Dr. Kalman Shan, M.D., [facility].C.P Pulmonary and Critical Care Medicine Staff Physician [facility] Pager: [phone], If no answer or between  15:00h - 7:00h: call [phone]  10/25/2012 5:34 PM

## 2012-10-25 NOTE — Telephone Encounter (Signed)
I spoke with pt and is aware of MR recs. She stated she is going to stick with the 80 since she has already picked this up. Nothing further was needed

## 2012-11-03 ENCOUNTER — Ambulatory Visit: Payer: Medicaid Other | Admitting: Internal Medicine

## 2012-11-10 ENCOUNTER — Ambulatory Visit (HOSPITAL_COMMUNITY): Payer: Medicaid Other | Attending: Cardiovascular Disease | Admitting: Radiology

## 2012-11-10 VITALS — BP 151/74 | Ht 67.0 in | Wt 176.0 lb

## 2012-11-10 DIAGNOSIS — Z8249 Family history of ischemic heart disease and other diseases of the circulatory system: Secondary | ICD-10-CM | POA: Insufficient documentation

## 2012-11-10 DIAGNOSIS — I10 Essential (primary) hypertension: Secondary | ICD-10-CM | POA: Insufficient documentation

## 2012-11-10 DIAGNOSIS — R42 Dizziness and giddiness: Secondary | ICD-10-CM | POA: Insufficient documentation

## 2012-11-10 DIAGNOSIS — R0602 Shortness of breath: Secondary | ICD-10-CM

## 2012-11-10 DIAGNOSIS — I2510 Atherosclerotic heart disease of native coronary artery without angina pectoris: Secondary | ICD-10-CM

## 2012-11-10 DIAGNOSIS — I6529 Occlusion and stenosis of unspecified carotid artery: Secondary | ICD-10-CM | POA: Insufficient documentation

## 2012-11-10 DIAGNOSIS — Z87891 Personal history of nicotine dependence: Secondary | ICD-10-CM | POA: Insufficient documentation

## 2012-11-10 DIAGNOSIS — I739 Peripheral vascular disease, unspecified: Secondary | ICD-10-CM | POA: Insufficient documentation

## 2012-11-10 MED ORDER — TECHNETIUM TC 99M SESTAMIBI GENERIC - CARDIOLITE
30.0000 | Freq: Once | INTRAVENOUS | Status: AC | PRN
  Administered 2012-11-10: 30 via INTRAVENOUS

## 2012-11-10 MED ORDER — TECHNETIUM TC 99M SESTAMIBI GENERIC - CARDIOLITE
10.0000 | Freq: Once | INTRAVENOUS | Status: AC | PRN
  Administered 2012-11-10: 10 via INTRAVENOUS

## 2012-11-10 MED ORDER — REGADENOSON 0.4 MG/5ML IV SOLN
0.4000 mg | Freq: Once | INTRAVENOUS | Status: AC
  Administered 2012-11-10: 0.4 mg via INTRAVENOUS

## 2012-11-10 NOTE — Progress Notes (Signed)
[facility] SITE 3 NUCLEAR MED [address] [place], [place] [address] [phone]    Cardiology Nuclear Med Study  Allison [name] is a 62 y.o. female     MRN : [id number]     DOB: May 16, 1951  Procedure Date: 11/10/2012  Nuclear Med Background Indication for Stress Test:  Evaluation for Ischemia and Stent Patency History:  08-2009 NSTEMI> Cath> CFX Stent with moderate residual CAD,09-2011 Echo: EF=55-60%, mild AR/MR, COPD Cardiac Risk Factors: Carotid Disease, Claudication, Family History - CAD, History of Smoking, Hypertension, Lipids and PVD  Symptoms:  Dizziness, DOE, Light-Headedness, Palpitations and Rapid HR   Nuclear Pre-Procedure Caffeine/Decaff Intake:  None NPO After: 6 pm   Lungs:  clear O2 Sat: 98% on room air. IV 0.9% NS with Angio Cath:  22g  IV Site: R Hand  IV Started by:  Bonnita Levan, RN  Chest Size (in):  38 Cup Size: B  Height: 5\' 7"  (1.702 m)  Weight:  176 lb (79.833 kg)  BMI:  Body mass index is 27.56 kg/(m^2). Tech Comments:  N/A    Nuclear Med Study 1 or 2 day study: 1 day  Stress Test Type:  Lexiscan  Reading MD: Charlton Haws, MD  Order Authorizing Provider:  Verne Carrow, MD  Resting Radionuclide: Technetium 63m Sestamibi  Resting Radionuclide Dose: 10.9 mCi   Stress Radionuclide:  Technetium 37m Sestamibi  Stress Radionuclide Dose: 33.0 mCi           Stress Protocol Rest HR: 55 Stress HR: 87  Rest BP: 151/74 Stress BP: 169/80  Exercise Time (min): n/a METS: n/a   Predicted Max HR: 160 bpm % Max HR: 54.38 bpm Rate Pressure Product: 46962   Dose of Adenosine (mg):  n/a Dose of Lexiscan: 0.4 mg  Dose of Atropine (mg): n/a Dose of Dobutamine: n/a mcg/kg/min (at max HR)  Stress Test Technologist: Irean Hong, RN  Nuclear Technologist:  Domenic Polite, CNMT     Rest Procedure:  Myocardial perfusion imaging was performed at rest 45 minutes following the intravenous administration of Technetium 75m Sestamibi. Rest ECG: NSR -  Normal EKG  Stress Procedure:  The patient received IV Lexiscan 0.4 mg over 15-seconds.  Technetium 59m Sestamibi injected at 30-seconds.  The patient complained of chest pain with Lexiscan.Quantitative spect images were obtained after a 45 minute delay. Stress ECG: No significant change from baseline ECG  QPS Raw Data Images:  Normal; no motion artifact; normal heart/lung ratio. Stress Images:  Inferior infarct Rest Images:  Inferior infarct Subtraction (SDS):  There is a fixed defect that is most consistent with a previous infarction. Transient Ischemic Dilatation (Normal <1.22):  1.21 Lung/Heart Ratio (Normal <0.45):  0.28  Quantitative Gated Spect Images QGS EDV:  92 ml QGS ESV:  33 ml  Impression Exercise Capacity:  Lexiscan with no exercise. BP Response:  Normal blood pressure response. Clinical Symptoms:  There is dyspnea. ECG Impression:  No significant ST segment change suggestive of ischemia. 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  LV Ejection Fraction: 64%.  LV Wall Motion:  LV appears enlarged with inferobasal hypokinesis but EF calculates as normal. Suggest echo correlation  Charlton Haws

## 2012-11-16 ENCOUNTER — Ambulatory Visit (INDEPENDENT_AMBULATORY_CARE_PROVIDER_SITE_OTHER): Payer: Medicaid Other | Admitting: Internal Medicine

## 2012-11-16 ENCOUNTER — Other Ambulatory Visit: Payer: Medicaid Other

## 2012-11-16 ENCOUNTER — Encounter: Payer: Self-pay | Admitting: Internal Medicine

## 2012-11-16 VITALS — BP 130/82 | HR 76 | Temp 98.0°F | Ht 66.0 in | Wt 180.0 lb

## 2012-11-16 DIAGNOSIS — Z23 Encounter for immunization: Secondary | ICD-10-CM

## 2012-11-16 DIAGNOSIS — J449 Chronic obstructive pulmonary disease, unspecified: Secondary | ICD-10-CM

## 2012-11-16 LAB — PULMONARY FUNCTION TEST
DL/VA: 3.22 ml/min/mmHg/L
DLCO unc % pred: 56 %
DLCO unc: 15.35 ml/min/mmHg
FEF 25-75 Pre: 0.34 L/sec
FEF2575-%Pred-Post: 16 %
FEV1-Post: 1.13 L
FEV1FVC-%Pred-Pre: 55 %
FEV6-%Pred-Post: 64 %
FEV6-%Pred-Pre: 60 %
FEV6-Post: 2.2 L
FEV6FVC-%Change-Post: -2 %
FEV6FVC-%Pred-Post: 88 %
FEV6FVC-%Pred-Pre: 90 %
FVC-Post: 2.58 L
TLC % pred: 107 %
TLC: 5.75 L

## 2012-11-16 NOTE — Progress Notes (Signed)
Subjective:    Patient ID: Allison [name], female    DOB: 1950/07/20, 62 y.o.   MRN: [id number]  HPI pcp is Forest Gleason, MD   IOV 09/29/2012 Referred by Dr. Melene Muller of cardiology for dyspnea   62 year old female. Accompanied by her husband. Greater than 40 pack smoker quit in 2011 following myocardial infarction status post cardiac stent x1. She recollects having insidious onset dyspnea few to several years even before myocardial infarction. At the time of  MI, she had acute onset of dyspnea and had a cardiac stent after that her dyspnea significantly improved. Around this time was also diagnosed with "moderate COPD" based on spirometry at [facility] family practice. She was on Spiriva but following diagnoses of myocardial infarction she stopped it because she also had pneumonia at the same time. Post cardiac stent dyspnea improved but over the last year she's having insidious worsening of dyspnea on exertion. Dyspnea is now brought on for activities like laundry and yard work and moving things in the house. Dyspnea is always relieved by rest. There is no associated chest pain, hemoptysis, claudication, orthopnea, paroxysmal nocturnal dyspnea.. Her cardiac ejection fraction as of 2013 is normal. Of note since she had cardiac stent she has not had a stress test but currently a stress test is planned. However cardiology feels she needs  COPD management optimized and therefore referred. Currently COPD cat score is 17 and only copd med is albuterol as needed  Walk test in the office 185 feet x3 laps: She was only able to complete 2 laps and stop because of dyspnea. Her lowest pulse ox was 97% and a highest heart rate was 108 per minute. Premature termination of sub maximal exercise due to dyspnea.   Spirometry is Gold stage IV COPD with FEV1 0.76 L/28%. With FEV1/FVC ratio of 48.       Dyspnea relevant history   - Smoking: 42 pack smoker quit 2011 - Cardiac history  - Left ventricle  ejection fraction in 2011 was between 40 and 45%. This had improved in March 2013 to  55% - Chest x-ray 11/12/2010  - Reported as COPD changes - Hemoglobin June 2013 is 12.6 g percent with a creatinine of 1.3 mg percent   REC You appeared to have severe COPD  Please start spiriva 1 puff daily - take sample, script and show technique, okay to restart; counseled her about the benefits  Please start symbicort 80/4.5 2 puff twice daily - take sample, script and show technique  Use albuterol 2 puffs as needed  Take prednisone 40 mg daily x 2 days, then 20mg  daily x 2 days, then 10mg  daily x 2 days, then 5mg  daily x 2 days and stop  Return to see me in 1 month with full pulmonary function test  COPD cat score at followup  AT followup I will check alpha 1, discuss rehab and vaccination   OV 11/16/2012  Followup for COPD  - At last visit a new diagnosis of COPD was made. At that time CP was noted to be very severe. She started on prednisone taper, Spiriva and Symbicort. Patient returns for followup . Pulmonary function test shows modest improvement of FEV1 to 300cc and currently she is a Gold stage III COPD.  post bd: fev1:  1.1L/41%, R 46, DLcO 15/56%. However, in terms of symptoms she is only some better in terms of dyspnea chest tightness and cough.Marland Kitchen COPD cat score is 16 and is essentially unchanged compared to 17 in prior  visit. She denies any associated chest pain.    CAT COPD Symptom & Quality of Life Score (GSK trademark) 0 is no burden. 5 is highest burden 09/29/2012  11/16/2012   Never Cough -> Cough all the time 2 0  No phlegm in chest -> Chest is full of phlegm 2 1  No chest tightness -> Chest feels very tight 0 3  No dyspnea for 1 flight stairs/hill -> Very dyspneic for 1 flight of stairs 4 3  No limitations for ADL at home -> Very limited with ADL at home 4 3  Confident leaving home -> Not at all confident leaving home 1 3  Sleep soundly -> Do not sleep soundly because of lung  condition 1 3  Lots of Energy -> No energy at all 3 3  TOTAL Score (max 40)  17 16   Past, Family, Social reviewed: no change since last visit   Review of Systems  Constitutional: Negative for fever and unexpected weight change.  HENT: Negative for ear pain, nosebleeds, congestion, sore throat, rhinorrhea, sneezing, trouble swallowing, dental problem, postnasal drip and sinus pressure.   Eyes: Negative for redness and itching.  Respiratory: Negative for cough, chest tightness, shortness of breath and wheezing.   Cardiovascular: Negative for palpitations and leg swelling.  Gastrointestinal: Negative for nausea and vomiting.  Genitourinary: Negative for dysuria.  Musculoskeletal: Negative for joint swelling.  Skin: Negative for rash.  Neurological: Negative for headaches.  Hematological: Does not bruise/bleed easily.  Psychiatric/Behavioral: Negative for dysphoric mood. The patient is not nervous/anxious.        Objective:   Physical Exam Vitals reviewed. Constitutional: She is oriented to person, place, and time. She appears well-developed and well-nourished. No distress.  mildy deconditioned looking  HENT:  Head: Normocephalic and atraumatic.  Right Ear: External ear normal.  Left Ear: External ear normal.  Mouth/Throat: Oropharynx is clear and moist. No oropharyngeal exudate.  Eyes: Conjunctivae and EOM are normal. Pupils are equal, round, and reactive to light. Right eye exhibits no discharge. Left eye exhibits no discharge. No scleral icterus.  Neck: Normal range of motion. Neck supple. No JVD present. No tracheal deviation present. No thyromegaly present.  Cardiovascular: Normal rate, regular rhythm, normal heart sounds and intact distal pulses.  Exam reveals no gallop and no friction rub.   No murmur heard. Pulmonary/Chest: Effort normal and breath sounds normal. No respiratory distress. She has no wheezes. She has no rales. She exhibits no tenderness.  Abdominal: Soft.  Bowel sounds are normal. She exhibits no distension and no mass. There is no tenderness. There is no rebound and no guarding.  Musculoskeletal: Normal range of motion. She exhibits no edema and no tenderness.  Lymphadenopathy:    She has no cervical adenopathy.  Neurological: She is alert and oriented to person, place, and time. She has normal reflexes. No cranial nerve deficit. She exhibits normal muscle tone. Coordination normal.  Skin: Skin is warm and dry. No rash noted. She is not diaphoretic. No erythema. No pallor.  Psychiatric: She has a normal mood and affect. Her behavior is normal. Judgment and thought content normal.           Assessment & Plan:

## 2012-11-16 NOTE — Patient Instructions (Addendum)
You appeared to have severe COPD Continue t spiriva 1 puff daily - Stop symbicort. Instead take BREO 1 puff daily Use albuterol 2 puffs as needed DO blood test for alpha 1 genetic test for copd Pneumovax vaccine today Always flu shot early in fall Return to see me in 2 month  COPD cat score at followup

## 2012-11-16 NOTE — Assessment & Plan Note (Signed)
Are not 100% sure why her clinical subjective improvement is only mild but objectively she has had significant improvement in her FEV1 by 300 cc. It is possible that she is deconditioned and will benefit from pulmonary rehabilitation. I will also try switching to Symbicort to Brio and see if we can optimize her lung function even further.  Plan You appeared to have severe COPD Continue t spiriva 1 puff daily - Stop symbicort. Instead take BREO 1 puff daily Use albuterol 2 puffs as needed DO blood test for alpha 1 genetic test for copd Pneumovax vaccine today Always flu shot early in fall Return to see me in 2 month  COPD cat score at followup

## 2012-11-16 NOTE — Progress Notes (Signed)
PFT done today. 

## 2012-11-20 LAB — ALPHA-1 ANTITRYPSIN PHENOTYPE: A-1 Antitrypsin: 131 mg/dL (ref 83–199)

## 2012-11-24 ENCOUNTER — Telehealth: Payer: Self-pay | Admitting: Internal Medicine

## 2012-11-24 NOTE — Telephone Encounter (Signed)
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 Glad she quit smoking. No action on this other than to monitor breathing test ever so often  Thanks  Dr. Kalman Shan, M.D., [facility].C.P Pulmonary and Critical Care Medicine Staff Physician [facility] Pulmonary and Critical Care Pager: [phone], If no answer or between  15:00h - 7:00h: call [phone]  11/24/2012 5:31 PM      reports that she quit smoking about 3 years ago. Her smoking use included Cigarettes. She has a 45 pack-year smoking history. She has never used smokeless tobacco.

## 2012-11-30 ENCOUNTER — Other Ambulatory Visit (INDEPENDENT_AMBULATORY_CARE_PROVIDER_SITE_OTHER): Payer: Medicaid Other

## 2012-11-30 DIAGNOSIS — E785 Hyperlipidemia, unspecified: Secondary | ICD-10-CM

## 2012-11-30 LAB — LIPID PANEL
HDL: 64.7 mg/dL (ref >39.00)
Total CHOL/HDL Ratio: 3
Triglycerides: 66 mg/dL (ref 0.0–149.0)
VLDL: 13.2 mg/dL (ref 0.0–40.0)

## 2012-11-30 LAB — HEPATIC FUNCTION PANEL
ALT: 20 U/L (ref 0–35)
AST: 23 U/L (ref 0–37)
Albumin: 3.6 g/dL (ref 3.5–5.2)

## 2012-12-01 ENCOUNTER — Telehealth: Payer: Self-pay | Admitting: Internal Medicine

## 2012-12-01 MED ORDER — FLUTICASONE FUROATE-VILANTEROL 100-25 MCG/INH IN AEPB: 1.0000 | INHALATION_SPRAY | Freq: Once | RESPIRATORY_TRACT | Status: DC

## 2012-12-01 NOTE — Telephone Encounter (Signed)
Spoke with pt states breo works great .I sent the Rx to the pharmacy nothing further needed.

## 2012-12-07 ENCOUNTER — Telehealth: Payer: Self-pay | Admitting: Internal Medicine

## 2012-12-07 NOTE — Telephone Encounter (Signed)
Message closed accidentally-will forward to [facility]

## 2012-12-07 NOTE — Telephone Encounter (Signed)
Will forward to [employer] to advise and follow through with PA. ATC patient to inform of this-NA and mailbox is full message given.

## 2012-12-07 NOTE — Telephone Encounter (Signed)
Pt advised. Thaine Garriga, CMA  

## 2012-12-14 ENCOUNTER — Telehealth: Payer: Self-pay | Admitting: Internal Medicine

## 2012-12-14 NOTE — Telephone Encounter (Signed)
jen has pa been started>  Please advise  Thank you

## 2012-12-15 NOTE — Telephone Encounter (Signed)
Called spoke with pharmacy tech at [facility] Annabelle Harman) Requested PA info be faxed triage Will await fax to initiate PA

## 2012-12-16 NOTE — Telephone Encounter (Signed)
PA info received from [facility] Per [employer], pt must have tried and failed spiriva for non-preferred drugs to be covered Per 5.13.14 ov w/ MR: Patient Instructions    You appeared to have severe COPD  Continue t spiriva 1 puff daily -  Stop symbicort. Instead take BREO 1 puff daily  Use albuterol 2 puffs as needed  DO blood test for alpha 1 genetic test for copd  Pneumovax vaccine today  Always flu shot early in fall  Return to see me in 2 month  COPD cat score at followup   PA form printed from https://[email or web address].[email or web address].[email or web address].html Placed in MR's lookat to complete and forward to MR and Jenn ATC pt to inform her of this and offer another sample(s) if needed, but NA and automated message stated that VM is not set up.  WCB.

## 2012-12-16 NOTE — Telephone Encounter (Signed)
PA still not received Called back and spoke with the pharmacist at [facility] and she will re fax Will await fax

## 2012-12-17 NOTE — Telephone Encounter (Signed)
Form completed and placed in MR look-at to sign. Carron Curie, CMA

## 2012-12-21 NOTE — Telephone Encounter (Signed)
Pt wants to know the status of prior auth for BREO. Allison [name]

## 2012-12-21 NOTE — Telephone Encounter (Signed)
LMTCB

## 2012-12-22 NOTE — Telephone Encounter (Addendum)
Form faxed back today, pt aware and that we will notify her of approval/denial. Carron Curie, CMA

## 2012-12-23 NOTE — Telephone Encounter (Signed)
noted 

## 2012-12-29 ENCOUNTER — Telehealth (HOSPITAL_COMMUNITY): Payer: Self-pay | Admitting: *Deleted

## 2012-12-29 NOTE — Telephone Encounter (Signed)
Allison [name] was contacted today regarding Pulmonary Rehab.  Her current medical coverage does not include Pulmonary Rehab. Forms have been mailed and instructions for completing these forms for financial assistance.  Cathie Olden  RN

## 2013-01-06 ENCOUNTER — Telehealth: Payer: Self-pay | Admitting: Internal Medicine

## 2013-01-06 NOTE — Telephone Encounter (Signed)
Pt returned call.  Spoke with patient who is calling to check on the PA for her breo.  Per pt's chart, the [employer] form was faxed by Victorino Dike on 6.18.14: Darrell Jewel, CMA at 12/22/2012 11:30 AM     Form faxed back today, pt aware and that we will notify her of approval/denial. Carron Curie, CMA    Advised pt will call [employer] to check on this.  Pt stated ok to LM with her granddaughter if she is unavailable. Called [employer], spoke with Johnny Bridge who reports nothing is on file for pt since May.  No sign of form faxed by Britt Boozer.  PA will have to be redone. Called [facility] and spoke with Tammy on the off-chance that they have anything on file > they do not.  Unable to locate the previous form in Jenn's/MR's lookat Form re-initiated and handed to MR's nurse for the day Per the [employer], the form needs to be faxed back to [phone] Left message w/ pt's granddaughter informing her that PA is going to have to be redone and already initiated; we also have samples for her if she needs them.  Asked granddaughter to have pt call us back today regarding the samples since the office is closed tomorrow for the 4th.  Will forward to MR and Mindy as she is working with MR this afternoon.

## 2013-01-06 NOTE — Telephone Encounter (Signed)
lmomtcb x1 

## 2013-01-06 NOTE — Telephone Encounter (Signed)
This is MR "look at" folder giving to him

## 2013-01-11 ENCOUNTER — Telehealth: Payer: Self-pay | Admitting: Internal Medicine

## 2013-01-11 MED ORDER — FLUTICASONE FUROATE-VILANTEROL 100-25 MCG/INH IN AEPB: 1.0000 | INHALATION_SPRAY | Freq: Every day | RESPIRATORY_TRACT | Status: DC

## 2013-01-11 NOTE — Telephone Encounter (Signed)
Please advise, thanks.

## 2013-01-11 NOTE — Telephone Encounter (Signed)
This was giving to MR in his look at folder. I am not working with him today. Will forward to MR.

## 2013-01-11 NOTE — Telephone Encounter (Signed)
Spoke with patient, requesting sample of breo Patient aware sample placed upfront and nothing further needed at this time

## 2013-01-14 NOTE — Telephone Encounter (Signed)
Form doone. Make sure my CMA for the day picks it up from me 01/14/2013 in PM office time   Dr. Kalman Shan, M.D., [facility].C.P Pulmonary and Critical Care Medicine Staff Physician [facility] Pulmonary and Critical Care Pager: [phone], If no answer or between  15:00h - 7:00h: call [phone]  01/14/2013 4:59 AM

## 2013-01-14 NOTE — Telephone Encounter (Signed)
I will forward this to Mindy, she is working with MR this PM and I will be out of office. Carron Curie, CMA

## 2013-01-14 NOTE — Telephone Encounter (Signed)
PA has been faxed back. Will forward to [employer]

## 2013-01-20 NOTE — Telephone Encounter (Signed)
Have you heard back from pharmacy status on PA? Please advise. Thanks.

## 2013-01-21 NOTE — Telephone Encounter (Signed)
Looked in MR look at box/Jenn box and did not see anything on pt.  I called # [phone] to check on status i was on hold x 5 minutes. WCB

## 2013-01-24 ENCOUNTER — Ambulatory Visit (INDEPENDENT_AMBULATORY_CARE_PROVIDER_SITE_OTHER): Payer: Medicaid Other | Admitting: Internal Medicine

## 2013-01-24 ENCOUNTER — Encounter: Payer: Self-pay | Admitting: Internal Medicine

## 2013-01-24 VITALS — BP 124/78 | HR 90 | Temp 98.0°F | Ht 67.0 in | Wt 184.6 lb

## 2013-01-24 DIAGNOSIS — J449 Chronic obstructive pulmonary disease, unspecified: Secondary | ICD-10-CM

## 2013-01-24 DIAGNOSIS — B370 Candidal stomatitis: Secondary | ICD-10-CM | POA: Insufficient documentation

## 2013-01-24 MED ORDER — NYSTATIN 100000 UNIT/ML MT SUSP: 500000.0000 [IU] | Freq: Four times a day (QID) | OROMUCOSAL | Status: DC

## 2013-01-24 NOTE — Assessment & Plan Note (Signed)
 #  COPD  - stable and improved  - continue BREO daily and spiriva daily; rinse after use - do interval training exercises on ellipitcal and kettlebell swing I told you about - flu shot in fall  #Followup 4-5 months with COPD CAT score at followup

## 2013-01-24 NOTE — Assessment & Plan Note (Signed)
#  oral thrush - probably related to symbicort few weeks ago  - # Oral thrush - For Oral thrush: Take Suspension (swish and swallow): 500,000 units 4 times/day for 5 days; swish in the mouth and retain for as long as possible (several minutes) before swallowing

## 2013-01-24 NOTE — Progress Notes (Signed)
Subjective:    Patient ID: Allison [name], female    DOB: 03-30-1951, 62 y.o.   MRN: [id number]  HPI  #COPD  - gold stage 3 - MS phenotpe  #Smoking   reports that she quit smoking about 3 years ago. Her smoking use included Cigarettes. She has a 45 pack-year smoking history. She has never used smokeless tobacco.  #imgaging data - May 20-12: clear cxr. No CT data   OV 01/24/2013  - 2 month followup to see response to BREO. I had originally started her on spiriva and symbicort. Wit that lung function improved from stage 4 copd to stage 3 copd but subjectively she was no better when I saw her 11/16/12. At that visit CAT score was stil 16. So, switched her symbicort to [facility] but told her to continue spiriva. With this huge improvement in subjecive dyspnea, and cough. She is now awaiting prior auth for BREO. In imterim ran out of BREO and went back on symbicort; made her worse and she got oral thrush. She has not started rehab yet; medicaid will not cover. Interested in home exercises. I counseled her on kettlebell swings and high intensity interval training  Labs - alpha 1 is MS     CAT COPD Symptom & Quality of Life Score (GSK trademark) 0 is no burden. 5 is highest burden 09/29/2012  11/16/2012 On spiriva and symbicort 01/24/2013 On spiriva and breo  Never Cough -> Cough all the time 2 0 1  No phlegm in chest -> Chest is full of phlegm 2 1 0  No chest tightness -> Chest feels very tight 0 3 0  No dyspnea for 1 flight stairs/hill -> Very dyspneic for 1 flight of stairs 4 3 1   No limitations for ADL at home -> Very limited with ADL at home 4 3 2   Confident leaving home -> Not at all confident leaving home 1 3 0  Sleep soundly -> Do not sleep soundly because of lung condition 1 3 0  Lots of Energy -> No energy at all 3 3 2   TOTAL Score (max 40)  17 16 6    Past, Family, Social reviewed: no change since last visit     Review of Systems  Constitutional: Negative.   HENT: Negative.    Eyes: Negative.   Respiratory: Positive for shortness of breath.   Cardiovascular: Negative.   Endocrine: Negative.   Genitourinary: Negative.   Musculoskeletal: Negative.   Skin: Negative.   Allergic/Immunologic: Negative.   Neurological: Negative.   Hematological: Negative.   Psychiatric/Behavioral: Negative.        Objective:   Physical Exam  Vitals reviewed. Constitutional: She is oriented to person, place, and time. She appears well-developed and well-nourished. No distress.  HENT:  Head: Normocephalic and atraumatic.  Right Ear: External ear normal.  Left Ear: External ear normal.  Mouth/Throat: Oropharynx is clear and moist. No oropharyngeal exudate.  Mild oral thrush + at left soft palate x 1 spot only  Eyes: Conjunctivae and EOM are normal. Pupils are equal, round, and reactive to light. Right eye exhibits no discharge. Left eye exhibits no discharge. No scleral icterus.  Neck: Normal range of motion. Neck supple. No JVD present. No tracheal deviation present. No thyromegaly present.  Cardiovascular: Normal rate, regular rhythm, normal heart sounds and intact distal pulses.  Exam reveals no gallop and no friction rub.   No murmur heard. Pulmonary/Chest: Effort normal and breath sounds normal. No respiratory distress. She has no wheezes.  She has no rales. She exhibits no tenderness.  Abdominal: Soft. Bowel sounds are normal. She exhibits no distension and no mass. There is no tenderness. There is no rebound and no guarding.  Musculoskeletal: Normal range of motion. She exhibits no edema and no tenderness.  Lymphadenopathy:    She has no cervical adenopathy.  Neurological: She is alert and oriented to person, place, and time. She has normal reflexes. No cranial nerve deficit. She exhibits normal muscle tone. Coordination normal.  Skin: Skin is warm and dry. No rash noted. She is not diaphoretic. No erythema. No pallor.  Psychiatric: She has a normal mood and affect. Her  behavior is normal. Judgment and thought content normal.          Assessment & Plan:

## 2013-01-24 NOTE — Telephone Encounter (Signed)
Called [employer] and they stated that the breo has been approved from 01/14/2013 through 01/09/2014.  Reference number for this call was [id number].   i have called the pharmacy and they did run this medication through with out any problems.  They will fill this rx for the pt and make the pt aware. Nothing further is needed.

## 2013-01-24 NOTE — Patient Instructions (Addendum)
#  oral thrush - probably related to symbicort few weeks ago - For Oral thrush: Take Suspension (swish and swallow): 500,000 units 4 times/day for 5 days; swish in the mouth and retain for as long as possible (several minutes) before swallowing  #COPD  - stable and improved  - continue BREO daily and spiriva daily; rinse after use - do interval training exercises on ellipitcal and kettlebell swing I told you about - flu shot in fall  #Followup 4-5 months with COPD CAT score at followup

## 2013-03-02 ENCOUNTER — Ambulatory Visit (INDEPENDENT_AMBULATORY_CARE_PROVIDER_SITE_OTHER): Payer: Medicaid Other | Admitting: Cardiovascular Disease

## 2013-03-02 ENCOUNTER — Encounter: Payer: Self-pay | Admitting: Cardiovascular Disease

## 2013-03-02 VITALS — BP 147/91 | HR 90 | Ht 67.0 in | Wt 183.0 lb

## 2013-03-02 DIAGNOSIS — I739 Peripheral vascular disease, unspecified: Secondary | ICD-10-CM

## 2013-03-02 DIAGNOSIS — I2510 Atherosclerotic heart disease of native coronary artery without angina pectoris: Secondary | ICD-10-CM

## 2013-03-02 DIAGNOSIS — I10 Essential (primary) hypertension: Secondary | ICD-10-CM

## 2013-03-02 MED ORDER — SIMVASTATIN 20 MG PO TABS: 20.0000 mg | ORAL_TABLET | Freq: Every evening | ORAL | Status: DC

## 2013-03-02 MED ORDER — TRIAMTERENE-HCTZ 37.5-25 MG PO TABS: 1.0000 | ORAL_TABLET | Freq: Every day | ORAL | Status: DC

## 2013-03-02 MED ORDER — CLOPIDOGREL BISULFATE 75 MG PO TABS: 75.0000 mg | ORAL_TABLET | Freq: Every day | ORAL | Status: DC

## 2013-03-02 NOTE — Progress Notes (Signed)
History of Present Illness: 62 yo WF with history of HTN, hyperlipidemia, CAD with prior NSTEMI, respiratory failure February 2011, PAD here today for cardiac follow up. Her PV issues are followed by Dr. Myra Gianotti with [facility]. She underwent cardiac cath in February 2011 in the setting of a NSTEMI showing severe stenosis of the Circumflex, now s/p DES to the Circumflex. Her EF was found to be 30%, presumed to be non-ischemic in etiology. Echo on 08/14/09 with EF of 40-45%. Her femoral angiogram showed a stenosis right SFA. Non-invasive studies in April 2011 with right ABI 0.93, Left ABI 0.70. There was suggestion of moderate disease in the left SFA ostium and moderate disease in the right SFA ostium. I arranged a distal aortogram with bilateral lower extremity runoff on 05/08/10. She was found to have a a severe stenosis at the ostium of the left SFA and moderate stenosis at the ostium of the right SFA. The ostium of the right profunda femoral artery had a severe stenosis. [place] atherectomy of the left SFA ostium was performed on 06/12/10. She continued to have severe pain in her left leg following the procedure. Repeat angiography April 2012 with severe stenosis left SFA from ostium down through the distal SFA. Dr. Durene Cal evaluated her and she had left femoral to above knee popliteal bypass on Nov 15, 2010. She did well and has been followed by Dr. Myra Gianotti for graft surveillance. Angiogram 12/16/11 with patent left femoral to popliteal artery bypass graft with 50-60% left common femoral artery stenosis. Conservative management pursued secondary to the small size of the vessel. Echo March 2013 with normal LV size and function and no significant valvular issues. Carotid artery dopplers May 2013 in [facility] with 40-59% RICA stenosis and less than 39% LICA stenosis.   She is here today for cardiac follow up. She reports no chest pain. Her legs feel great. She is not smoking. Overall doing well. Has f/u this fall  with Myra Gianotti.   Primary Care Physician: [facility] (Dr. Shelva Majestic)  Last Lipid Profile:Lipid Panel     Component Value Date/Time   CHOL 171 11/30/2012 1122   TRIG 66.0 11/30/2012 1122   HDL 64.70 11/30/2012 1122   CHOLHDL 3 11/30/2012 1122   VLDL 13.2 11/30/2012 1122   LDLCALC 93 11/30/2012 1122     Past Medical History  Diagnosis Date  . Hypertension   . COPD (chronic obstructive pulmonary disease)   . Tobacco abuse     hx of  . Coronary artery disease     PCI, DES circumflex 2/11. moderate disease LAD  . Hyperlipidemia   . PVD (peripheral vascular disease)     lower extremity runoff 05/08/10  . Ischemic cardiomyopathy     Echo 08/14/09 with LVEF 40-45%.     Past Surgical History  Procedure Laterality Date  . Tonsillectomy    . Coronary stent placement    . Femoral bypass Left May 2012  . Lower extremity angiogram  December 16, 2011    Current Outpatient Prescriptions  Medication Sig Dispense Refill  . albuterol (PROVENTIL HFA;VENTOLIN HFA) 108 (90 BASE) MCG/ACT inhaler Inhale 2 puffs into the lungs every 6 (six) hours as needed for wheezing.  2 Inhaler  6  . aspirin 81 MG tablet Take 81 mg by mouth daily.        . clopidogrel (PLAVIX) 75 MG tablet Take 1 tablet (75 mg total) by mouth daily.  30 tablet  5  . escitalopram (LEXAPRO) 10 MG tablet  Take 1 tablet (10 mg total) by mouth daily.  30 tablet  11  . Fluticasone Furoate-Vilanterol (BREO ELLIPTA) 100-25 MCG/INH AEPB Inhale into the lungs. Take 1 puff daily      . losartan (COZAAR) 100 MG tablet Take 1 tablet (100 mg total) by mouth daily.  30 tablet  11  . metoprolol (LOPRESSOR) 50 MG tablet Take 1 tablet (50 mg total) by mouth 2 (two) times daily.  60 tablet  11  . nystatin (MYCOSTATIN) 100000 UNIT/ML suspension Take 5 mLs (500,000 Units total) by mouth 4 (four) times daily. Swish and retain in mouth as long as possible then swallow.  60 mL  0  . simvastatin (ZOCOR) 20 MG tablet Take 1 tablet (20 mg total) by mouth  every evening.  30 tablet  5  . tiotropium (SPIRIVA) 18 MCG inhalation capsule Place 18 mcg into inhaler and inhale daily.      Marland Kitchen triamterene-hydrochlorothiazide (MAXZIDE-25) 37.5-25 MG per tablet Take 1 each (1 tablet total) by mouth daily.  30 tablet  11   No current facility-administered medications for this visit.    Allergies  Allergen Reactions  . Ace Inhibitors   . Amoxicillin-Pot Clavulanate   . Codeine     History   Social History  . Marital Status: Married    Spouse Name: N/A    Number of Children: 3  . Years of Education: N/A   Occupational History  . sculptor    Social History Main Topics  . Smoking status: Former Smoker -- 1.50 packs/day for 30 years    Types: Cigarettes    Quit date: 08/12/2009  . Smokeless tobacco: Never Used     Comment: she has smoker 1-2 packs daily since the age of 27. 80 pack years  . Alcohol Use: No  . Drug Use: No  . Sexual Activity: Not on file   Other Topics Concern  . Not on file   Social History Narrative  . No narrative on file    Family History  Problem Relation Age of Onset  . Coronary artery disease Mother     deceased. coronary artery bypass graft  . Cancer Mother     BREAST  . Heart disease Mother     PVD  - Amputation-Leg  . Hyperlipidemia Mother   . Hypertension Mother   . Heart attack Mother   . Cirrhosis Father     deceased  . Cancer Father   . Other Brother     alive, unknown hx    Review of Systems:  As stated in the HPI and otherwise negative.   BP 147/91  Pulse 90  Ht 5\' 7"  (1.702 m)  Wt 183 lb (83.008 kg)  BMI 28.66 kg/m2  Physical Examination: General: Well developed, well nourished, NAD HEENT: OP clear, mucus membranes moist SKIN: warm, dry. No rashes. Neuro: No focal deficits Musculoskeletal: Muscle strength 5/5 all ext Psychiatric: Mood and affect normal Neck: No JVD, no carotid bruits, no thyromegaly, no lymphadenopathy. Lungs:Clear bilaterally, no wheezes, rhonci,  crackles Cardiovascular: Regular rate and rhythm. No murmurs, gallops or rubs. Abdomen:Soft. Bowel sounds present. Non-tender.  Extremities: No lower extremity edema. Pulses are not palpable in the bilateral DP/PT.  Stress myoview 11/10/12: Impression  Exercise Capacity: Lexiscan with no exercise.  BP Response: Normal blood pressure response.  Clinical Symptoms: There is dyspnea.  ECG Impression: No significant ST segment change suggestive of ischemia.  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  LV Ejection Fraction: 64%. LV Wall Motion: LV appears enlarged with inferobasal hypokinesis but EF calculates as normal. Suggest echo correlation   Assessment and Plan:   1. CAD: Stable. Will continue current medical therapy.   2. PAD: Followed in [facility]   3. Hyperlipidemia: Continue statin.   4. COPD: Followed in [facility]

## 2013-03-02 NOTE — Patient Instructions (Addendum)
Your physician wants you to follow-up in: 12 months.  You will receive a reminder letter in the mail two months in advance. If you don't receive a letter, please call our office to schedule the follow-up appointment.  Your physician recommends that you continue on your current medications as directed. Please refer to the Current Medication list given to you today.   

## 2013-04-07 ENCOUNTER — Other Ambulatory Visit: Payer: Self-pay | Admitting: Surgery

## 2013-04-07 DIAGNOSIS — I739 Peripheral vascular disease, unspecified: Secondary | ICD-10-CM

## 2013-04-11 ENCOUNTER — Inpatient Hospital Stay (HOSPITAL_COMMUNITY): Admission: RE | Admit: 2013-04-11 | Payer: Medicaid Other | Source: Ambulatory Visit

## 2013-04-11 ENCOUNTER — Ambulatory Visit: Payer: Self-pay | Admitting: Family

## 2013-04-11 ENCOUNTER — Other Ambulatory Visit (HOSPITAL_COMMUNITY): Payer: Medicaid Other

## 2013-04-11 ENCOUNTER — Encounter (HOSPITAL_COMMUNITY): Payer: Medicaid Other

## 2013-05-12 ENCOUNTER — Other Ambulatory Visit: Payer: Self-pay

## 2013-05-16 ENCOUNTER — Telehealth: Payer: Self-pay | Admitting: Cardiovascular Disease

## 2013-05-16 NOTE — Telephone Encounter (Signed)
error 

## 2013-05-19 NOTE — Telephone Encounter (Signed)
Losartan approved for payment through [facility]

## 2013-06-15 ENCOUNTER — Encounter: Payer: Self-pay | Admitting: Internal Medicine

## 2013-06-15 ENCOUNTER — Ambulatory Visit (INDEPENDENT_AMBULATORY_CARE_PROVIDER_SITE_OTHER): Payer: Medicaid Other | Admitting: Internal Medicine

## 2013-06-15 VITALS — BP 120/84 | HR 94 | Temp 97.9°F | Ht 67.0 in | Wt 182.0 lb

## 2013-06-15 DIAGNOSIS — B370 Candidal stomatitis: Secondary | ICD-10-CM

## 2013-06-15 DIAGNOSIS — Z129 Encounter for screening for malignant neoplasm, site unspecified: Secondary | ICD-10-CM

## 2013-06-15 DIAGNOSIS — J449 Chronic obstructive pulmonary disease, unspecified: Secondary | ICD-10-CM

## 2013-06-15 MED ORDER — FLUCONAZOLE 200 MG PO TABS: ORAL_TABLET | ORAL | Status: DC

## 2013-06-15 NOTE — Progress Notes (Signed)
Subjective:    Patient ID: Allison [name], female    DOB: [birth year], 62 y.o.   MRN: [id number]  HPI  #COPD  - gold stage 3 - MS phenotpe - medicaid refused rehab  #Smoking   reports that she quit smoking about 3 years ago. Her smoking use included Cigarettes. She has a 45 pack-year smoking history. She has never used smokeless tobacco.  #imgaging data - May 20-12: clear cxr. No CT data - Dec 2014: she is open for low dose CT but will save $  #Alpha 1   - MS  #Thrusus  - July 2014 on symbicort - dec 2014 on bre0   OV 06/15/2013   Followup for all of the above. Overall she's doing well. COPD stable. She really appreciate his prevent Brio combination. No interim exacerbations, new medical problems, prednisone burst, antibiotics, emergency room visit to urgent care visits or hospitalizations. She will have the flu shot today for this season. Her smoking is still in remission.  We discussed lung cancer screening and she is open to the idea but she will save money first. Medicare wont pay this till she turns 39 which will be 5 years from now  It appears that her thrush has not resolved  CAT COPD Symptom & Quality of Life Score ([employer] trademark) 0 is no burden. 5 is highest burden 09/29/2012  11/16/2012 On spiriva and symbicort 01/24/2013 On spiriva and breo 06/15/2013   Never Cough -> Cough all the time 2 0 1   No phlegm in chest -> Chest is full of phlegm 2 1 0   No chest tightness -> Chest feels very tight 0 3 0   No dyspnea for 1 flight stairs/hill -> Very dyspneic for 1 flight of stairs 4 3 1    No limitations for ADL at home -> Very limited with ADL at home 4 3 2    Confident leaving home -> Not at all confident leaving home 1 3 0   Sleep soundly -> Do not sleep soundly because of lung condition 1 3 0   Lots of Energy -> No energy at all 3 3 2    TOTAL Score (max 40)  17 16 6      Review of Systems  Constitutional: Negative for fever, chills, diaphoresis, activity change,  appetite change, fatigue and unexpected weight change.  HENT: Negative for congestion, dental problem, ear discharge, ear pain, facial swelling, hearing loss, mouth sores, nosebleeds, postnasal drip, rhinorrhea, sinus pressure, sneezing, sore throat, tinnitus, trouble swallowing and voice change.   Eyes: Negative for photophobia, discharge, itching and visual disturbance.  Respiratory: Positive for shortness of breath. Negative for apnea, cough, choking, chest tightness, wheezing and stridor.   Cardiovascular: Negative for chest pain, palpitations and leg swelling.  Gastrointestinal: Negative for nausea, vomiting, abdominal pain, constipation, blood in stool and abdominal distention.  Genitourinary: Negative for dysuria, urgency, frequency, hematuria, flank pain, decreased urine volume and difficulty urinating.  Musculoskeletal: Negative for arthralgias, back pain, gait problem, joint swelling, myalgias, neck pain and neck stiffness.  Skin: Negative for color change, pallor and rash.  Neurological: Negative for dizziness, tremors, seizures, syncope, speech difficulty, weakness, light-headedness, numbness and headaches.  Hematological: Negative for adenopathy. Does not bruise/bleed easily.  Psychiatric/Behavioral: Negative for confusion, sleep disturbance and agitation. The patient is not nervous/anxious.        Objective:   Physical Exam  Vitals reviewed. Constitutional: She is oriented to person, place, and time. She appears well-developed and well-nourished. No distress.  HENT:  Head: Normocephalic and atraumatic.  Right Ear: External ear normal.  Left Ear: External ear normal.  Mouth/Throat: Oropharynx is clear and moist. No oropharyngeal exudate.  Oral thrush +  Eyes: Conjunctivae and EOM are normal. Pupils are equal, round, and reactive to light. Right eye exhibits no discharge. Left eye exhibits no discharge. No scleral icterus.  Neck: Normal range of motion. Neck supple. No JVD  present. No tracheal deviation present. No thyromegaly present.  Cardiovascular: Normal rate, regular rhythm, normal heart sounds and intact distal pulses.  Exam reveals no gallop and no friction rub.   No murmur heard. Pulmonary/Chest: Effort normal and breath sounds normal. No respiratory distress. She has no wheezes. She has no rales. She exhibits no tenderness.  Abdominal: Soft. Bowel sounds are normal. She exhibits no distension and no mass. There is no tenderness. There is no rebound and no guarding.  Musculoskeletal: Normal range of motion. She exhibits no edema and no tenderness.  Lymphadenopathy:    She has no cervical adenopathy.  Neurological: She is alert and oriented to person, place, and time. She has normal reflexes. No cranial nerve deficit. She exhibits normal muscle tone. Coordination normal.  Skin: Skin is warm and dry. No rash noted. She is not diaphoretic. No erythema. No pallor.  Psychiatric: She has a normal mood and affect. Her behavior is normal. Judgment and thought content normal.          Assessment & Plan:

## 2013-06-15 NOTE — Patient Instructions (Addendum)
#  oral thrush - seems to be recurring despite change to BREO - fluconazole 200 mg loading dose, followed by 100 mg daily for 7days   #COPD  - stable  - flu shot today 06/15/2013  - continue BREO daily and spiriva daily; rinse after use \ - relearn technique of both from CMA  #Lung cancer scren  - as discussed please save $ 300 for this  #Followup 4 weeks with NP to ensure no thrush 6 months with COPD CAT score at followup

## 2013-06-25 DIAGNOSIS — Z129 Encounter for screening for malignant neoplasm, site unspecified: Secondary | ICD-10-CM | POA: Insufficient documentation

## 2013-06-25 NOTE — Assessment & Plan Note (Signed)
#  Lung cancer scren #lung cancer screening I discussed screening Ct chest for early detection of lung cancer Explained that in age 60-75 and smoking history, annual low dose CT chest can pick up lung cancer early and has potential to save lives and cure lung cancer This is similar in concept to screening mammogram, colonoscopies and pap smears I explained Ct scan is low dose radiation I explained early lung cancer asymptomatic and only way to  detect is CT  With the real advantage that early lung cancer is curable through radiation or surgery I explained CT superior to CXR I explained that false positives are present and can incur cost and workup like biopsies, additional scan but benefit outweighs risk I explained currently out of pocket $300 I recommend one a year for a minimum of 3 years   She wills ave $ for this   - as discussed please save $ 300 for this

## 2013-06-25 NOTE — Assessment & Plan Note (Signed)
#  COPD  - stable  - flu shot today 06/15/2013  - continue BREO daily and spiriva daily; rinse after use \ - relearn technique of both from CMA   #Followup 4 weeks with NP to ensure no thrush 6 months with COPD CAT score at followup

## 2013-06-25 NOTE — Assessment & Plan Note (Signed)
#  oral thrush - seems to be recurring despite change to BREO - fluconazole 200 mg loading dose, followed by 100 mg daily for 7days

## 2013-07-13 ENCOUNTER — Telehealth: Payer: Self-pay | Admitting: Internal Medicine

## 2013-07-13 ENCOUNTER — Ambulatory Visit (INDEPENDENT_AMBULATORY_CARE_PROVIDER_SITE_OTHER): Payer: Medicaid Other | Admitting: Adult Health

## 2013-07-13 ENCOUNTER — Encounter: Payer: Self-pay | Admitting: Adult Health

## 2013-07-13 VITALS — BP 122/74 | HR 92 | Temp 97.6°F | Ht 67.0 in | Wt 178.0 lb

## 2013-07-13 DIAGNOSIS — J4489 Other specified chronic obstructive pulmonary disease: Secondary | ICD-10-CM

## 2013-07-13 DIAGNOSIS — J449 Chronic obstructive pulmonary disease, unspecified: Secondary | ICD-10-CM

## 2013-07-13 MED ORDER — CLOTRIMAZOLE 10 MG MT TROC: 10.0000 mg | Freq: Every day | OROMUCOSAL | Status: DC

## 2013-07-13 NOTE — Progress Notes (Signed)
Subjective:    Patient ID: Allison [name], female    DOB: [birth year], 63 y.o.   MRN: [id number]  HPI  #COPD  - gold stage 3 - MS phenotpe - medicaid refused rehab  #Smoking   reports that she quit smoking about 3 years ago. Her smoking use included Cigarettes. She has a 45 pack-year smoking history. She has never used smokeless tobacco.  #imgaging data - May 20-12: clear cxr. No CT data - Dec 2014: she is open for low dose CT but will save $  #Alpha 1   - MS  #Thrusus  - July 2014 on symbicort - dec 2014 on bre0   OV 06/15/2013 Followup for all of the above. Overall she's doing well. COPD stable. She really appreciate his prevent Brio combination. No interim exacerbations, new medical problems, prednisone burst, antibiotics, emergency room visit to urgent care visits or hospitalizations. She will have the flu shot today for this season. Her smoking is still in remission.  We discussed lung cancer screening and she is open to the idea but she will save money first. Medicare wont pay this till she turns 10 which will be 5 years from now  It appears that her thrush has not resolved  CAT COPD Symptom & Quality of Life Score ([place] trademark) 0 is no burden. 5 is highest burden 09/29/2012  11/16/2012 On spiriva and symbicort 01/24/2013 On spiriva and breo 06/15/2013   Never Cough -> Cough all the time 2 0 1   No phlegm in chest -> Chest is full of phlegm 2 1 0   No chest tightness -> Chest feels very tight 0 3 0   No dyspnea for 1 flight stairs/hill -> Very dyspneic for 1 flight of stairs 4 3 1    No limitations for ADL at home -> Very limited with ADL at home 4 3 2    Confident leaving home -> Not at all confident leaving home 1 3 0   Sleep soundly -> Do not sleep soundly because of lung condition 1 3 0   Lots of Energy -> No energy at all 3 3 2    TOTAL Score (max 40)  17 16 6     07/13/13  Follow up COPD  Patient returns for 4 week followup for COPD Last visit. Patient was  experiencing recurrent thrush. She was treated with a seven-day course of Diflucan. Patient reports it to go away, however, has returned now. Patient is on chronic inhalers, which she believes is causing her symptoms. Patient was continued on BREO  Last visit. However, patient feels that her breathing is not as good on this and it causes a tickle in her throat. She denies any hemoptysis, orthopnea, PND, or leg swelling.   Review of Systems  Constitutional: Negative for fever, chills, diaphoresis, activity change, appetite change, fatigue and unexpected weight change.  HENT: Negative for congestion, dental problem, ear discharge, ear pain, facial swelling, hearing loss, mouth sores, nosebleeds, postnasal drip, rhinorrhea, sinus pressure, sneezing, sore throat, tinnitus, trouble swallowing and voice change.   Eyes: Negative for photophobia, discharge, itching and visual disturbance.  Respiratory: Positive for shortness of breath. Negative for apnea, cough, choking, chest tightness, wheezing and stridor.   Cardiovascular: Negative for chest pain, palpitations and leg swelling.  Gastrointestinal: Negative for nausea, vomiting, abdominal pain, constipation, blood in stool and abdominal distention.  Genitourinary: Negative for dysuria, urgency, frequency, hematuria, flank pain, decreased urine volume and difficulty urinating.  Musculoskeletal: Negative for arthralgias, back pain,  gait problem, joint swelling, myalgias, neck pain and neck stiffness.  Skin: Negative for color change, pallor and rash.  Neurological: Negative for dizziness, tremors, seizures, syncope, speech difficulty, weakness, light-headedness, numbness and headaches.  Hematological: Negative for adenopathy. Does not bruise/bleed easily.  Psychiatric/Behavioral: Negative for confusion, sleep disturbance and agitation. The patient is not nervous/anxious.        Objective:   Physical Exam  Vitals reviewed. Constitutional: She is  oriented to person, place, and time. She appears well-developed and well-nourished. No distress.  HENT:  Head: Normocephalic and atraumatic.  Right Ear: External ear normal.  Left Ear: External ear normal.  Mouth/Throat: Oropharynx is clear and moist. No oropharyngeal exudate.  Oral thrush +  Eyes: Conjunctivae and EOM are normal. Pupils are equal, round, and reactive to light. Right eye exhibits no discharge. Left eye exhibits no discharge. No scleral icterus.  Neck: Normal range of motion. Neck supple. No JVD present. No tracheal deviation present. No thyromegaly present.  Cardiovascular: Normal rate, regular rhythm, normal heart sounds and intact distal pulses.  Exam reveals no gallop and no friction rub.   No murmur heard. Pulmonary/Chest: Effort normal and breath sounds normal. No respiratory distress. She has no wheezes. She has no rales. She exhibits no tenderness.  Abdominal: Soft. Bowel sounds are normal. She exhibits no distension and no mass. There is no tenderness. There is no rebound and no guarding.  Musculoskeletal: Normal range of motion. She exhibits no edema and no tenderness.  Lymphadenopathy:    She has no cervical adenopathy.  Neurological: She is alert and oriented to person, place, and time. She has normal reflexes. No cranial nerve deficit. She exhibits normal muscle tone. Coordination normal.  Skin: Skin is warm and dry. No rash noted. She is not diaphoretic. No erythema. No pallor.  Psychiatric: She has a normal mood and affect. Her behavior is normal. Judgment and thought content normal.          Assessment & Plan:

## 2013-07-13 NOTE — Patient Instructions (Signed)
Stop Breo  Begin Dulera 2 puffs Twice daily  -rinse after use.  Try Armor Hammer Baking soda/peroxide rinse or peroxide mixed in water for rinse.  Stop Cozaar  Begin Benicar 40mg  daily  Mycelex troche fives times daily for 10 days .  Follow up Dr. Chase Caller 4 weeks and As needed   Please contact office for sooner follow up if symptoms do not improve or worsen or seek emergency care

## 2013-07-13 NOTE — Telephone Encounter (Signed)
No mention of this in today's office note  Called spoke with patient who stated that over the holiday she began having chest congestion/cough/wheezing/dyspnea and she took one of her daughter-in-law's duoneb that "cleared it right up".  Patient is asking for a rx for this to have on hand.  Pt is also asking about needing a cxr.  TP out of the office this afternoon.  Pt okay with call back tomorrow.  TP please advise, thank you.

## 2013-07-15 MED ORDER — DOXYCYCLINE HYCLATE 100 MG PO TABS: 100.0000 mg | ORAL_TABLET | Freq: Two times a day (BID) | ORAL | Status: DC

## 2013-07-15 MED ORDER — MOMETASONE FURO-FORMOTEROL FUM 200-5 MCG/ACT IN AERO: 2.0000 | INHALATION_SPRAY | Freq: Two times a day (BID) | RESPIRATORY_TRACT | Status: DC

## 2013-07-15 MED ORDER — OLMESARTAN MEDOXOMIL 40 MG PO TABS: 40.0000 mg | ORAL_TABLET | Freq: Every day | ORAL | Status: DC

## 2013-07-15 NOTE — Telephone Encounter (Signed)
Doxycycline 100mg  #14 1 po Twice daily  , no refills  Mucinex DM Twice daily  As needed  Cough/congesiton  She was suppose to have chest xray  Can come on Monday to have done, put order in,does not need to be seen will call with results.  Please contact office for sooner follow up if symptoms do not improve or worsen or seek emergency care

## 2013-07-15 NOTE — Telephone Encounter (Signed)
Spoke with pt. Aware of TP recs. She will come Monday for CXR. Rx has been sent. Nothing further needed

## 2013-07-15 NOTE — Assessment & Plan Note (Signed)
COPD complicated by oncgoing cough/upper airway irritation.  Increased SE with BREO Complicated by Thrush with chronic inhaler use.  ? Cozaar contributing to cough, although this is an ARB it has been documented in some cases that Cozaar can cause a cough.  Will give empiric trial off this medication along with trigger prevention, cough control and thrush tx.   Plan  Stop Breo  Begin Dulera 2 puffs Twice daily  -rinse after use.  Try Armor Hammer Baking soda/peroxide rinse or peroxide mixed in water for rinse.  Stop Cozaar  Begin Benicar 40mg  daily  Mycelex troche fives times daily for 10 days .  Follow up Dr. Chase Caller 4 weeks and As needed   Please contact office for sooner follow up if symptoms do not improve or worsen or seek emergency care

## 2013-07-19 ENCOUNTER — Telehealth: Payer: Self-pay | Admitting: Internal Medicine

## 2013-07-19 NOTE — Telephone Encounter (Signed)
Per TP: Duoneb and Spiriva contain the same type of medication (Atrovent and Spiriva) therefore we cannot prescribe the Duoneb.  If she begins to experience symptoms again while taking her maintenance medications and prn Ventolin HFA, she is to call the office for ov or recommendations.  Thanks.

## 2013-07-19 NOTE — Telephone Encounter (Signed)
Called and spoke with pt and she is aware of TP recs.  Pt to call back for any other concerns.

## 2013-07-19 NOTE — Telephone Encounter (Signed)
Questionnaire copied below:    Allison [name] - Questionnaire Submission ','[facility] Detail >>          Questionnaire Submission  Allison [name]  MRN: [id number] DOB: [birth year]     Pt Home: [phone]     Sent: Sun July 17, 2013 9:34 PM   Entered: [phone]                           Current View: Showing all answers Show Only Relevant Answers    Legend: Scores, Non-relevant Questions     Patient Responses     Chl Mychart After Visit Questionnaire    Question 07/17/2013 9:34 PM   How are you feeling after your recent visit? confused   Does the recommended course of treatment seem to be helping your symptoms? not sure   Are you experiencing any side effects from your recommended treatment? no   is there anything else you would like to ask your physician? yes         Message    Patient Questionnaire Submission   --------------------------------      Questionnaire: Questionnaire      Question: How are you feeling after your recent visit?   Answer: confused      Question: Does the recommended course of treatment seem to be helping your symptoms?   Answer: not sure      Question: Are you experiencing any side effects from your recommended treatment?   Answer: no      Question: is there anything else you would like to ask your physician?   Answer: yes      ----- Message -----   From: Rexene Edison, NP   Sent: 07/15/2013 2:17 PM   To: Allison [name]   Subject: Questionnaire       To ensure we are providing you the highest quality healthcare, we'd like to know how you are feeling after your recent visit. At your earliest convenience, please complete the brief follow-up assessment by clicking the Task: Questionnaire link listed above.      Thank you for your time in helping [place] improve our services and for partnering with [place] in your wellness and care.      Sincerely,      Your Care Team

## 2013-07-19 NOTE — Telephone Encounter (Signed)
I called and spoke with pt. She reports when she saw TP on 07/13/13 we were suppose to call in duoneb nebulizer for her and this was not done. No mention of this in the note. Pt did call back same day 07/13/13. No mention still of [place] calling in this medication. She reports this is not what was said from the OV with TP. Tammy, please advise if we were suppose to call this in? thanks

## 2013-07-20 ENCOUNTER — Encounter: Payer: Self-pay | Admitting: Adult Health

## 2013-08-07 ENCOUNTER — Other Ambulatory Visit: Payer: Self-pay | Admitting: Adult Health

## 2013-08-08 MED ORDER — CLOTRIMAZOLE 10 MG MT TROC
10.0000 mg | Freq: Every day | OROMUCOSAL | Status: DC
Start: 2013-08-08 — End: 2014-01-27

## 2013-08-08 NOTE — Addendum Note (Signed)
Addended by: Horatio Pel on: 08/08/2013 03:51 PM   Modules accepted: Orders

## 2013-08-19 ENCOUNTER — Ambulatory Visit (INDEPENDENT_AMBULATORY_CARE_PROVIDER_SITE_OTHER): Payer: Medicaid Other | Admitting: Internal Medicine

## 2013-08-19 ENCOUNTER — Encounter: Payer: Self-pay | Admitting: Internal Medicine

## 2013-08-19 VITALS — BP 118/78 | HR 86 | Temp 97.6°F | Ht 67.0 in | Wt 174.2 lb

## 2013-08-19 DIAGNOSIS — J449 Chronic obstructive pulmonary disease, unspecified: Secondary | ICD-10-CM

## 2013-08-19 NOTE — Patient Instructions (Signed)
#  oral thrush - resolved   #COPD  - stable  - continue BREO daily and spiriva daily; rinse after use -  #Followup 6 months with COPD CAT score at followup spiromety at followup consider PREVNAR at followup

## 2013-08-19 NOTE — Progress Notes (Signed)
Subjective:    Patient ID: Allison [name], female    DOB: 1951/01/15, 63 y.o.   MRN: [id number]  HPI  #COPD  - gold stage 3 - MS phenotpe - medicaid refused rehab  #Smoking   reports that she quit smoking about 3 years ago. Her smoking use included Cigarettes. She has a 45 pack-year smoking history. She has never used smokeless tobacco.  #imgaging data - May 20-12: clear cxr. No CT data - Dec 2014: she is open for low dose CT but will save $  #Alpha 1   - MS  #Thrusus  - July 2014 on symbicort - dec 2014 on bre0   #Lung cancre screen  - discussed dec 2014: will wait till [place] approves and she is age 55    07/13/13  Follow up COPD  Patient returns for 4 week followup for COPD Last visit. Patient was experiencing recurrent thrush. She was treated with a seven-day course of Diflucan. Patient reports it to go away, however, has returned now. Patient is on chronic inhalers, which she believes is causing her symptoms. Patient was continued on BREO  Last visit. However, patient feels that her breathing is not as good on this and it causes a tickle in her throat. She denies any hemoptysis, orthopnea, PND, or leg swelling.   REC Stop Breo  Begin Dulera 2 puffs Twice daily  -rinse after use.  Try Armor Hammer Baking soda/peroxide rinse or peroxide mixed in water for rinse.  Stop Cozaar  Begin Benicar 40mg  daily  Mycelex troche fives times daily for 10 days .  Follow up Dr. Chase Caller 4 weeks and As needed   Please contact office for sooner follow up if symptoms do not improve or worsen or seek emergency care    OV 08/19/2013 Chief Complaint  Patient presents with  . Follow-up    Pt reports starting back Breo--Dulera did not work for pt. Pt states that she feels the trush is cleared up. Given Mycelex troche to use fives times daily for 10 days by TP. Pt is also back on Cozaar     Followup COPD:Overall she's doing well. COPD stable. She really appreciate Spiriva and  Brio  combination. Last month NP asked her tor try dulera due to thursh ahd cough but she feels BREO better and so back onit. She was on aslo changed from cozaar to benicar but now back on benicar. No interim exacerbations, new medical problems, prednisone burst, antibiotics, emergency room visit to urgent care visits or hospitalizations. She will have the flu shot today for this season.   Smoking: moking is still in remission.  Thrush: resolved with NP recs at loast ov     CAT COPD Symptom & Quality of Life Score ([employer]) 0 is no burden. 5 is highest burden 09/29/2012  11/16/2012 On spiriva and symbicort 01/24/2013 On spiriva and breo 08/19/2013   Never Cough -> Cough all the time 2 0 1   No phlegm in chest -> Chest is full of phlegm 2 1 0   No chest tightness -> Chest feels very tight 0 3 0   No dyspnea for 1 flight stairs/hill -> Very dyspneic for 1 flight of stairs 4 3 1    No limitations for ADL at home -> Very limited with ADL at home 4 3 2    Confident leaving home -> Not at all confident leaving home 1 3 0   Sleep soundly -> Do not sleep soundly because of lung  condition 1 3 0   Lots of Energy -> No energy at all 3 3 2    TOTAL Score (max 40)  17 16 6        Current outpatient prescriptions:albuterol (PROVENTIL HFA;VENTOLIN HFA) 108 (90 BASE) MCG/ACT inhaler, Inhale 2 puffs into the lungs every 6 (six) hours as needed for wheezing., Disp: 2 Inhaler, Rfl: 6;  aspirin 81 MG tablet, Take 81 mg by mouth daily.  , Disp: , Rfl: ;  clopidogrel (PLAVIX) 75 MG tablet, Take 1 tablet (75 mg total) by mouth daily., Disp: 30 tablet, Rfl: 11 clotrimazole (MYCELEX) 10 MG troche, Take 1 tablet (10 mg total) by mouth 5 (five) times daily., Disp: 50 tablet, Rfl: 0;  escitalopram (LEXAPRO) 10 MG tablet, Take 1 tablet (10 mg total) by mouth daily., Disp: 30 tablet, Rfl: 11;  Fluticasone Furoate-Vilanterol (BREO ELLIPTA) 100-25 MCG/INH AEPB, Inhale into the lungs. Take 1 puff daily, Disp: , Rfl: ;  losartan  (COZAAR) 50 MG tablet, Take 50 mg by mouth daily., Disp: , Rfl:  metoprolol (LOPRESSOR) 50 MG tablet, Take 1 tablet (50 mg total) by mouth 2 (two) times daily., Disp: 60 tablet, Rfl: 11;  simvastatin (ZOCOR) 20 MG tablet, Take 1 tablet (20 mg total) by mouth every evening., Disp: 30 tablet, Rfl: 11;  tiotropium (SPIRIVA) 18 MCG inhalation capsule, Place 18 mcg into inhaler and inhale daily., Disp: , Rfl:  triamterene-hydrochlorothiazide (MAXZIDE-25) 37.5-25 MG per tablet, Take 1 tablet by mouth daily., Disp: 30 tablet, Rfl: 11    Review of Systems  Constitutional: Negative for fever and unexpected weight change.  HENT: Negative for congestion, dental problem, ear pain, nosebleeds, postnasal drip, rhinorrhea, sinus pressure, sneezing, sore throat and trouble swallowing.   Eyes: Negative for redness and itching.  Respiratory: Positive for cough. Negative for chest tightness, shortness of breath and wheezing.        Using mucinex to keep mucus clear  Cardiovascular: Negative for palpitations and leg swelling.  Gastrointestinal: Negative for nausea and vomiting.  Genitourinary: Negative for dysuria.  Musculoskeletal: Negative for joint swelling.  Skin: Negative for rash.  Neurological: Negative for headaches.  Hematological: Does not bruise/bleed easily.  Psychiatric/Behavioral: Negative for dysphoric mood. The patient is not nervous/anxious.        Objective:   Physical Exam       Physical Exam  Vitals reviewed. Constitutional: She is oriented to person, place, and time. She appears well-developed and well-nourished. No distress.  HENT:  Head: Normocephalic and atraumatic.  Right Ear: External ear normal.  Left Ear: External ear normal.  Mouth/Throat: Oropharynx is clear and moist. No oropharyngeal exudate.  Oral thrush resolved Eyes: Conjunctivae and EOM are normal. Pupils are equal, round, and reactive to light. Right eye exhibits no discharge. Left eye exhibits no discharge. No  scleral icterus.  Neck: Normal range of motion. Neck supple. No JVD present. No tracheal deviation present. No thyromegaly present.  Cardiovascular: Normal rate, regular rhythm, normal heart sounds and intact distal pulses.  Exam reveals no gallop and no friction rub.   No murmur heard. Pulmonary/Chest: Effort normal and breath sounds normal. No respiratory distress. She has no wheezes. She has no rales. She exhibits no tenderness.  Abdominal: Soft. Bowel sounds are normal. She exhibits no distension and no mass. There is no tenderness. There is no rebound and no guarding.  Musculoskeletal: Normal range of motion. She exhibits no edema and no tenderness.  Lymphadenopathy:    She has no cervical adenopathy.  Neurological: She  is alert and oriented to person, place, and time. She has normal reflexes. No cranial nerve deficit. She exhibits normal muscle tone. Coordination normal.  Skin: Skin is warm and dry. No rash noted. She is not diaphoretic. No erythema. No pallor.  Psychiatric: She has a normal mood and affect. Her behavior is normal. Judgment and thought content normal.        Assessment & Plan:  #oral thrush - resolved   #COPD  - stable  - continue BREO daily and spiriva daily; rinse after use -  #Followup 6 months with COPD CAT score at followup spiromety at followup consider PREVNAR at followup

## 2013-08-26 ENCOUNTER — Telehealth: Payer: Self-pay | Admitting: Internal Medicine

## 2013-08-26 MED ORDER — TIOTROPIUM BROMIDE MONOHYDRATE 18 MCG IN CAPS: 18.0000 ug | ORAL_CAPSULE | Freq: Every day | RESPIRATORY_TRACT | Status: DC

## 2013-08-26 NOTE — Telephone Encounter (Signed)
Pt aware RX sent 

## 2013-09-22 ENCOUNTER — Telehealth: Payer: Self-pay | Admitting: *Deleted

## 2013-09-22 DIAGNOSIS — I2510 Atherosclerotic heart disease of native coronary artery without angina pectoris: Secondary | ICD-10-CM

## 2013-09-22 MED ORDER — ESCITALOPRAM OXALATE 10 MG PO TABS: 10.0000 mg | ORAL_TABLET | Freq: Every day | ORAL | Status: DC

## 2013-09-22 NOTE — Telephone Encounter (Signed)
Message copied by Fernande Boyden on Thu Sep 22, 2013  1:08 PM ------      Message from: Lauree Chandler D      Created: Wed Sep 21, 2013  8:32 AM      Regarding: RE: escitalopram 10 mg 1 daily       That will be fine to refill. Thanks, chris            ----- Message -----         From: Fernande Boyden, RN         Sent: 09/20/2013   4:53 PM           To: Burnell Blanks, MD      Subject: escitalopram 10 mg 1 daily                               Can we refill this, Fraser Din said you had refilled before, Thanks Lovett Sox, RN patient care advocate            [facility]       ------

## 2013-09-28 ENCOUNTER — Other Ambulatory Visit: Payer: Self-pay | Admitting: *Deleted

## 2013-09-28 MED ORDER — LOSARTAN POTASSIUM 100 MG PO TABS: 100.0000 mg | ORAL_TABLET | Freq: Every day | ORAL | Status: DC

## 2013-11-09 ENCOUNTER — Telehealth: Payer: Self-pay | Admitting: Internal Medicine

## 2013-11-09 DIAGNOSIS — J449 Chronic obstructive pulmonary disease, unspecified: Secondary | ICD-10-CM

## 2013-11-09 MED ORDER — ALBUTEROL SULFATE HFA 108 (90 BASE) MCG/ACT IN AERS: 2.0000 | INHALATION_SPRAY | Freq: Four times a day (QID) | RESPIRATORY_TRACT | Status: DC | PRN

## 2013-11-09 MED ORDER — FLUTICASONE FUROATE-VILANTEROL 100-25 MCG/INH IN AEPB: 1.0000 | INHALATION_SPRAY | Freq: Every day | RESPIRATORY_TRACT | Status: DC

## 2013-11-09 NOTE — Telephone Encounter (Signed)
Pt aware that Rx's have been sent to pharmacy. Nothing more needed at this time.

## 2013-12-27 ENCOUNTER — Other Ambulatory Visit: Payer: Self-pay | Admitting: *Deleted

## 2013-12-27 MED ORDER — METOPROLOL TARTRATE 50 MG PO TABS: 50.0000 mg | ORAL_TABLET | Freq: Two times a day (BID) | ORAL | Status: DC

## 2014-01-23 ENCOUNTER — Other Ambulatory Visit: Payer: Self-pay

## 2014-01-23 MED ORDER — LOSARTAN POTASSIUM 100 MG PO TABS: 100.0000 mg | ORAL_TABLET | Freq: Every day | ORAL | Status: DC

## 2014-01-26 ENCOUNTER — Telehealth: Payer: Self-pay | Admitting: Internal Medicine

## 2014-01-26 NOTE — Telephone Encounter (Signed)
Received PA for Breo on Allison [name]. Printed [place] tracks and filled out form and placed in MR's look at's to be signed and faxed back.

## 2014-01-27 ENCOUNTER — Encounter (HOSPITAL_COMMUNITY): Payer: Self-pay | Admitting: Emergency Medicine

## 2014-01-27 ENCOUNTER — Emergency Department (HOSPITAL_COMMUNITY): Payer: Medicaid Other

## 2014-01-27 ENCOUNTER — Inpatient Hospital Stay (HOSPITAL_COMMUNITY)
Admission: EM | Admit: 2014-01-27 | Discharge: 2014-01-31 | DRG: 690 | Disposition: A | Discharge status: Home / Self Care | Payer: Medicaid Other | Attending: Internal Medicine | Admitting: Internal Medicine

## 2014-01-27 DIAGNOSIS — I2589 Other forms of chronic ischemic heart disease: Secondary | ICD-10-CM | POA: Diagnosis present

## 2014-01-27 DIAGNOSIS — I255 Ischemic cardiomyopathy: Secondary | ICD-10-CM

## 2014-01-27 DIAGNOSIS — N10 Acute tubulo-interstitial nephritis: Principal | ICD-10-CM

## 2014-01-27 DIAGNOSIS — I4891 Unspecified atrial fibrillation: Secondary | ICD-10-CM | POA: Diagnosis present

## 2014-01-27 DIAGNOSIS — Z9861 Coronary angioplasty status: Secondary | ICD-10-CM

## 2014-01-27 DIAGNOSIS — I482 Chronic atrial fibrillation, unspecified: Secondary | ICD-10-CM

## 2014-01-27 DIAGNOSIS — I10 Essential (primary) hypertension: Secondary | ICD-10-CM

## 2014-01-27 DIAGNOSIS — I429 Cardiomyopathy, unspecified: Secondary | ICD-10-CM

## 2014-01-27 DIAGNOSIS — Z87891 Personal history of nicotine dependence: Secondary | ICD-10-CM

## 2014-01-27 DIAGNOSIS — E861 Hypovolemia: Secondary | ICD-10-CM | POA: Diagnosis present

## 2014-01-27 DIAGNOSIS — I2541 Coronary artery aneurysm: Secondary | ICD-10-CM

## 2014-01-27 DIAGNOSIS — Z129 Encounter for screening for malignant neoplasm, site unspecified: Secondary | ICD-10-CM

## 2014-01-27 DIAGNOSIS — I252 Old myocardial infarction: Secondary | ICD-10-CM

## 2014-01-27 DIAGNOSIS — I2510 Atherosclerotic heart disease of native coronary artery without angina pectoris: Secondary | ICD-10-CM

## 2014-01-27 DIAGNOSIS — N2889 Other specified disorders of kidney and ureter: Secondary | ICD-10-CM

## 2014-01-27 DIAGNOSIS — I739 Peripheral vascular disease, unspecified: Secondary | ICD-10-CM

## 2014-01-27 DIAGNOSIS — R1084 Generalized abdominal pain: Secondary | ICD-10-CM

## 2014-01-27 DIAGNOSIS — C7800 Secondary malignant neoplasm of unspecified lung: Secondary | ICD-10-CM | POA: Diagnosis present

## 2014-01-27 DIAGNOSIS — I70219 Atherosclerosis of native arteries of extremities with intermittent claudication, unspecified extremity: Secondary | ICD-10-CM

## 2014-01-27 DIAGNOSIS — F17200 Nicotine dependence, unspecified, uncomplicated: Secondary | ICD-10-CM

## 2014-01-27 DIAGNOSIS — N3001 Acute cystitis with hematuria: Secondary | ICD-10-CM

## 2014-01-27 DIAGNOSIS — E871 Hypo-osmolality and hyponatremia: Secondary | ICD-10-CM

## 2014-01-27 DIAGNOSIS — J449 Chronic obstructive pulmonary disease, unspecified: Secondary | ICD-10-CM | POA: Diagnosis present

## 2014-01-27 DIAGNOSIS — I480 Paroxysmal atrial fibrillation: Secondary | ICD-10-CM

## 2014-01-27 DIAGNOSIS — E785 Hyperlipidemia, unspecified: Secondary | ICD-10-CM | POA: Diagnosis present

## 2014-01-27 DIAGNOSIS — I6529 Occlusion and stenosis of unspecified carotid artery: Secondary | ICD-10-CM

## 2014-01-27 DIAGNOSIS — R310 Gross hematuria: Secondary | ICD-10-CM

## 2014-01-27 DIAGNOSIS — Z8249 Family history of ischemic heart disease and other diseases of the circulatory system: Secondary | ICD-10-CM

## 2014-01-27 DIAGNOSIS — C649 Malignant neoplasm of unspecified kidney, except renal pelvis: Secondary | ICD-10-CM

## 2014-01-27 DIAGNOSIS — B370 Candidal stomatitis: Secondary | ICD-10-CM

## 2014-01-27 DIAGNOSIS — C7951 Secondary malignant neoplasm of bone: Secondary | ICD-10-CM

## 2014-01-27 DIAGNOSIS — D500 Iron deficiency anemia secondary to blood loss (chronic): Secondary | ICD-10-CM

## 2014-01-27 DIAGNOSIS — Z7902 Long term (current) use of antithrombotics/antiplatelets: Secondary | ICD-10-CM

## 2014-01-27 DIAGNOSIS — D62 Acute posthemorrhagic anemia: Secondary | ICD-10-CM | POA: Diagnosis present

## 2014-01-27 DIAGNOSIS — R109 Unspecified abdominal pain: Secondary | ICD-10-CM | POA: Diagnosis present

## 2014-01-27 HISTORY — DX: Unspecified atrial fibrillation: I48.91

## 2014-01-27 HISTORY — DX: Secondary malignant neoplasm of unspecified lung: C78.00

## 2014-01-27 HISTORY — DX: Acute pyelonephritis: N10

## 2014-01-27 HISTORY — DX: Malignant neoplasm of unspecified kidney, except renal pelvis: C64.9

## 2014-01-27 LAB — CBC WITH DIFFERENTIAL/PLATELET
Basophils Absolute: 0 10*3/uL (ref 0.0–0.1)
Basophils Relative: 0 % (ref 0–1)
EOS ABS: 0 10*3/uL (ref 0.0–0.7)
EOS PCT: 0 % (ref 0–5)
HCT: 34.8 % — ABNORMAL LOW (ref 36.0–46.0)
HEMOGLOBIN: 12.4 g/dL (ref 12.0–15.0)
LYMPHS ABS: 1.3 10*3/uL (ref 0.7–4.0)
Lymphocytes Relative: 10 % — ABNORMAL LOW (ref 12–46)
MCH: 31.3 pg (ref 26.0–34.0)
MCHC: 35.6 g/dL (ref 30.0–36.0)
MCV: 87.9 fL (ref 78.0–100.0)
MONOS PCT: 6 % (ref 3–12)
Monocytes Absolute: 0.7 10*3/uL (ref 0.1–1.0)
Neutro Abs: 11.2 10*3/uL — ABNORMAL HIGH (ref 1.7–7.7)
Neutrophils Relative %: 84 % — ABNORMAL HIGH (ref 43–77)
PLATELETS: 360 10*3/uL (ref 150–400)
RBC: 3.96 MIL/uL (ref 3.87–5.11)
RDW: 15 % (ref 11.5–15.5)
WBC: 13.3 10*3/uL — ABNORMAL HIGH (ref 4.0–10.5)

## 2014-01-27 LAB — COMPREHENSIVE METABOLIC PANEL
ALT: 12 U/L (ref 0–35)
ANION GAP: 17 — AB (ref 5–15)
AST: 18 U/L (ref 0–37)
Albumin: 3.5 g/dL (ref 3.5–5.2)
Alkaline Phosphatase: 106 U/L (ref 39–117)
BUN: 10 mg/dL (ref 6–23)
CO2: 19 mEq/L (ref 19–32)
Calcium: 9 mg/dL (ref 8.4–10.5)
Chloride: 84 mEq/L — ABNORMAL LOW (ref 96–112)
Creatinine, Ser: 0.82 mg/dL (ref 0.50–1.10)
GFR calc non Af Amer: 76 mL/min — ABNORMAL LOW (ref >90)
GFR, EST AFRICAN AMERICAN: 88 mL/min — AB (ref >90)
GLUCOSE: 121 mg/dL — AB (ref 70–99)
Potassium: 3.3 mEq/L — ABNORMAL LOW (ref 3.7–5.3)
Sodium: 120 mEq/L — CL (ref 137–147)
TOTAL PROTEIN: 7.1 g/dL (ref 6.0–8.3)
Total Bilirubin: 0.4 mg/dL (ref 0.3–1.2)

## 2014-01-27 LAB — URINE MICROSCOPIC-ADD ON

## 2014-01-27 LAB — URINALYSIS, ROUTINE W REFLEX MICROSCOPIC
Glucose, UA: NEGATIVE mg/dL
KETONES UR: 15 mg/dL — AB
NITRITE: POSITIVE — AB
SPECIFIC GRAVITY, URINE: 1.01 (ref 1.005–1.030)
Urobilinogen, UA: 4 mg/dL — ABNORMAL HIGH (ref 0.0–1.0)
pH: 7 (ref 5.0–8.0)

## 2014-01-27 MED ORDER — IOHEXOL 300 MG/ML  SOLN
100.0000 mL | Freq: Once | INTRAMUSCULAR | Status: AC | PRN
  Administered 2014-01-27: 100 mL via INTRAVENOUS

## 2014-01-27 MED ORDER — DILTIAZEM LOAD VIA INFUSION
10.0000 mg | Freq: Once | INTRAVENOUS | Status: AC
  Administered 2014-01-27: 10 mg via INTRAVENOUS
  Filled 2014-01-27: qty 10

## 2014-01-27 MED ORDER — ONDANSETRON HCL 4 MG/2ML IJ SOLN
4.0000 mg | Freq: Once | INTRAMUSCULAR | Status: AC
  Administered 2014-01-27: 4 mg via INTRAMUSCULAR
  Filled 2014-01-27: qty 2

## 2014-01-27 MED ORDER — SODIUM CHLORIDE 0.9 % IV SOLN
Freq: Once | INTRAVENOUS | Status: AC
  Administered 2014-01-27: 100 mL/h via INTRAVENOUS

## 2014-01-27 MED ORDER — MORPHINE SULFATE 2 MG/ML IJ SOLN
2.0000 mg | Freq: Once | INTRAMUSCULAR | Status: AC
Start: 2014-01-27 — End: 2014-01-27
  Administered 2014-01-27: 2 mg via INTRAVENOUS
  Filled 2014-01-27: qty 1

## 2014-01-27 MED ORDER — DILTIAZEM HCL 100 MG IV SOLR
5.0000 mg/h | INTRAVENOUS | Status: DC
  Administered 2014-01-27: 5 mg/h via INTRAVENOUS
  Filled 2014-01-27: qty 100

## 2014-01-27 MED ORDER — IPRATROPIUM-ALBUTEROL 0.5-2.5 (3) MG/3ML IN SOLN
3.0000 mL | Freq: Once | RESPIRATORY_TRACT | Status: AC
  Administered 2014-01-27: 3 mL via RESPIRATORY_TRACT
  Filled 2014-01-27: qty 3

## 2014-01-27 NOTE — ED Notes (Signed)
States symptoms started about 5 days ago with abd pain about 5 days ago, uti diagnosis placed on bactum, blood in urine, cleared up for a while, blood in urine again today heavily with pain again to upper belly and low in abd around bladder.

## 2014-01-27 NOTE — ED Provider Notes (Signed)
CSN: [id number]     Arrival date & time 01/27/14  2059 History   First MD Initiated Contact with Patient 01/27/14 2106     Chief Complaint  Patient presents with  . Hematuria     (Consider location/radiation/quality/duration/timing/severity/associated sxs/prior Treatment) The history is provided by the patient.   Allison [name] is a 63 y.o. female who presents to the Emergency Department complaining of mid abdominal pain for 5 days with blood in her urine.  She states  That she contacted a tele doctor at onset and was diagnosed with a UTI and given Bactrim which she has been taking daily.  She reports intermittent episodes of blood in her urine that worsened today.  Reports "a lot of blood and clots" with urination and frequent urination.  Patient also reports h/o of COPD and has increased shortness of breath with exertion today.  She denies chest pain.  Patient does have h/o "atrial fib" but denies increased rate.  She is followed by a pulmonologist and cards.  She denies back pain, flank pain, vomiting, or fever   Past Medical History  Diagnosis Date  . Hypertension   . COPD (chronic obstructive pulmonary disease)   . Tobacco abuse     hx of  . Coronary artery disease     PCI, DES circumflex 2/11. moderate disease LAD  . Hyperlipidemia   . PVD (peripheral vascular disease)     lower extremity runoff 05/08/10  . Ischemic cardiomyopathy     Echo 08/14/09 with LVEF 40-45%.   Edrick Kins    Past Surgical History  Procedure Laterality Date  . Tonsillectomy    . Coronary stent placement    . Femoral bypass Left May 2012  . Lower extremity angiogram  December 16, 2011   Family History  Problem Relation Age of Onset  . Coronary artery disease Mother     deceased. coronary artery bypass graft  . Cancer Mother     BREAST  . Heart disease Mother     PVD  - Amputation-Leg  . Hyperlipidemia Mother   . Hypertension Mother   . Heart attack Mother   . Cirrhosis Father     deceased  . Cancer  Father   . Other Brother     alive, unknown hx   History  Substance Use Topics  . Smoking status: Former Smoker -- 1.50 packs/day for 30 years    Types: Cigarettes    Quit date: 08/12/2009  . Smokeless tobacco: Never Used     Comment: she has smoker 1-2 packs daily since the age of 21. 78 pack years  . Alcohol Use: No   OB History   Grav Para Term Preterm Abortions TAB SAB Ect Mult Living                 Review of Systems  Constitutional: Negative for fever, chills and appetite change.  Respiratory: Positive for shortness of breath. Negative for cough, chest tightness and wheezing.   Cardiovascular: Negative for chest pain.  Gastrointestinal: Positive for abdominal pain. Negative for nausea, vomiting, blood in stool and abdominal distention.  Genitourinary: Positive for dysuria, frequency and hematuria. Negative for flank pain, decreased urine volume, vaginal bleeding, vaginal discharge, difficulty urinating and vaginal pain.  Musculoskeletal: Negative for back pain.  Skin: Negative for color change and rash.  Neurological: Negative for dizziness, syncope, speech difficulty, weakness, numbness and headaches.  Hematological: Negative for adenopathy.  All other systems reviewed and are negative.  Allergies  Ace inhibitors; Amoxicillin-pot clavulanate; and Codeine  Home Medications   Prior to Admission medications   Medication Sig Start Date End Date Taking? Authorizing Provider  albuterol (PROVENTIL HFA;VENTOLIN HFA) 108 (90 BASE) MCG/ACT inhaler Inhale 2 puffs into the lungs every 6 (six) hours as needed for wheezing. 11/09/13  Yes Brand Males, MD  clopidogrel (PLAVIX) 75 MG tablet Take 1 tablet (75 mg total) by mouth daily. 03/02/13  Yes Burnell Blanks, MD  Cranberry 450 MG CAPS Take 1 capsule by mouth daily.   Yes Historical Provider, MD  escitalopram (LEXAPRO) 10 MG tablet Take 1 tablet (10 mg total) by mouth daily. 09/22/13  Yes Burnell Blanks, MD    Fluticasone Furoate-Vilanterol (BREO ELLIPTA) 100-25 MCG/INH AEPB Inhale 1 puff into the lungs daily. Take 1 puff daily 11/09/13  Yes Brand Males, MD  Ibuprofen-Diphenhydramine HCl (ADVIL PM) 200-25 MG CAPS Take 1 capsule by mouth at bedtime.   Yes Historical Provider, MD  losartan (COZAAR) 100 MG tablet Take 1 tablet (100 mg total) by mouth daily. 01/23/14  Yes Burnell Blanks, MD  metoprolol (LOPRESSOR) 50 MG tablet Take 1 tablet (50 mg total) by mouth 2 (two) times daily. 12/27/13  Yes Burnell Blanks, MD  simvastatin (ZOCOR) 20 MG tablet Take 1 tablet (20 mg total) by mouth every evening. 03/02/13 03/02/14 Yes Burnell Blanks, MD  sulfamethoxazole-trimethoprim (BACTRIM DS) 800-160 MG per tablet Take 1 tablet by mouth 2 (two) times daily. 5 day course starting on 01/23/2014   Yes Historical Provider, MD  tiotropium (SPIRIVA) 18 MCG inhalation capsule Place 1 capsule (18 mcg total) into inhaler and inhale daily. 08/26/13  Yes Brand Males, MD  triamterene-hydrochlorothiazide (MAXZIDE-25) 37.5-25 MG per tablet Take 1 tablet by mouth daily. 03/02/13  Yes Burnell Blanks, MD   BP 141/62  Pulse 100  Temp(Src) 98.2 F (36.8 C) (Oral)  Resp 18  Ht 5\' 7"  (1.702 m)  Wt 170 lb (77.111 kg)  BMI 26.62 kg/m2  SpO2 99% Physical Exam  Nursing note and vitals reviewed. Constitutional: She is oriented to person, place, and time. She appears well-developed and well-nourished. No distress.  HENT:  Head: Normocephalic and atraumatic.  Mouth/Throat: Oropharynx is clear and moist.  Neck: Normal range of motion. Neck supple.  Cardiovascular: Intact distal pulses.  An irregularly irregular rhythm present. Tachycardia present.   Pulmonary/Chest: Effort normal and breath sounds normal. She has no wheezes. She has no rales. She exhibits no tenderness.  Diminished lung sounds bilaterally, no rales or wheezing on exam  Abdominal: Soft. Normal appearance. She exhibits no distension and  no mass. There is no hepatosplenomegaly. There is tenderness. There is no rebound, no guarding and no CVA tenderness.    Musculoskeletal: Normal range of motion.  Lymphadenopathy:    She has no cervical adenopathy.  Neurological: She is alert and oriented to person, place, and time. She exhibits normal muscle tone. Coordination normal.  Skin: Skin is warm and dry.    ED Course  Procedures (including critical care time) Labs Review Labs Reviewed  CBC WITH DIFFERENTIAL - Abnormal; Notable for the following:    WBC 13.3 (*)    HCT 34.8 (*)    Neutrophils Relative % 84 (*)    Neutro Abs 11.2 (*)    Lymphocytes Relative 10 (*)    All other components within normal limits  COMPREHENSIVE METABOLIC PANEL - Abnormal; Notable for the following:    Sodium 120 (*)    Potassium 3.3 (*)  Chloride 84 (*)    Glucose, Bld 121 (*)    GFR calc non Af Amer 76 (*)    GFR calc Af Amer 88 (*)    Anion gap 17 (*)    All other components within normal limits  URINALYSIS, ROUTINE W REFLEX MICROSCOPIC - Abnormal; Notable for the following:    Color, Urine BROWN (*)    APPearance CLOUDY (*)    Hgb urine dipstick LARGE (*)    Bilirubin Urine MODERATE (*)    Ketones, ur 15 (*)    Protein, ur >300 (*)    Urobilinogen, UA 4.0 (*)    Nitrite POSITIVE (*)    Leukocytes, UA MODERATE (*)    All other components within normal limits  URINE MICROSCOPIC-ADD ON - Abnormal; Notable for the following:    Bacteria, UA FEW (*)    All other components within normal limits  URINE CULTURE    Imaging Review Ct Abdomen Pelvis W Contrast  01/28/2014   CLINICAL DATA:  Lower abdominal pain.  Hematuria.  EXAM: CT ABDOMEN AND PELVIS WITH CONTRAST  TECHNIQUE: Multidetector CT imaging of the abdomen and pelvis was performed using the standard protocol following bolus administration of intravenous contrast.  CONTRAST:  160mL OMNIPAQUE IOHEXOL 300 MG/ML  SOLN  COMPARISON:  None.  FINDINGS: There is a 1.6 cm nodule at the  right lung base (image 11 of 22). This may reflect metastatic disease. Mild bibasilar atelectasis is noted.  The liver and spleen are unremarkable in appearance. The gallbladder is within normal limits. The pancreas and adrenal glands are unremarkable.  There is a large peripherally enhancing centrally necrotic mass arising at the anterior aspect of the left kidney. It measures approximately 6.4 x 5.4 x 5.9 cm, and is unusually lobulated in appearance. This is compatible with a renal cell carcinoma. Surrounding soft tissue inflammation is seen. Surrounding angiogenesis is suggested.  Left-sided perinephric stranding and fluid are seen, and mild stranding is seen tracking along the course of the left ureter. This may reflect underlying pyelonephritis, or may be reactive secondary to the large mass.  A 2.1 cm cyst is noted near the upper pole of the right kidney. A few small nonobstructing stones are seen at the upper pole of the right kidney.  No free fluid is identified. The small bowel is unremarkable in appearance. The stomach is within normal limits. No acute vascular abnormalities are seen. Relatively diffuse calcification is seen along the abdominal aorta and its branches.  The appendix is normal in caliber and contains air, without evidence for appendicitis. The colon is largely decompressed and grossly unremarkable in appearance.  The bladder is largely decompressed and grossly unremarkable. The uterus is within normal limits. The ovaries are relatively symmetric. No suspicious adnexal masses are seen. No inguinal lymphadenopathy is seen.  No acute osseous abnormalities are identified. A few tiny bone islands are noted within the visualized osseous structures.  IMPRESSION: 1. 6.4 x 5.4 x 5.9 cm peripherally enhancing centrally necrotic mass along the anterior aspect of the left kidney, suspicious for a renal cell carcinoma. Surrounding soft tissue inflammation seen. Surrounding angiogenesis suggested. 2. Left  renal perinephric stranding and fluid noted, with mild stranding about the left ureter. This may reflect underlying pyelonephritis, or may be reactive secondary to the large mass. 3. 1.6 cm nodule noted at the right lung base. This raises concern for metastatic disease. 4. Right renal cyst seen. Few small nonobstructing right renal stones noted. 5. Mild bibasilar atelectasis noted.  6. Relatively diffuse calcification along the abdominal aorta and its branches.   Electronically Signed   By: Garald Balding M.D.   On: 01/28/2014 00:26   Dg Chest Portable 1 View  01/27/2014   CLINICAL DATA:  ABDOMINAL PAIN VAGINAL BLEEDING  EXAM: PORTABLE CHEST - 1 VIEW  COMPARISON:  11/12/2010  FINDINGS: Heart size upper limits normal. Prominent linear left perihilar and right basilar opacities, increased since previous. No confluent airspace disease. No effusion. Regional bones unremarkable.  IMPRESSION: 1. Some increase in asymmetric linear parenchymal opacities as above.   Electronically Signed   By: Arne Cleveland M.D.   On: 01/27/2014 21:38     EKG Interpretation   Date/Time:  Friday January 27 2014 21:06:33 EDT Ventricular Rate:  132 PR Interval:    QRS Duration: 91 QT Interval:  330 QTC Calculation: 489 R Axis:   68 Text Interpretation:  Atrial fibrillation Ventricular premature complex  Borderline ST depression, anterolateral leads Borderline prolonged QT  interval Baseline wander in lead(s) V4 V6 Confirmed by Lacinda Axon  MD, BRIAN  [phone]) on 01/27/2014 9:36:19 PM      MDM   Final diagnoses:  Generalized abdominal pain  Hyponatremia    Patient seen by Dr. Lacinda Axon and care plan discussed to include IV fluids, labs, imaging of chest and abdomen, address pain  Patient has h/o a fib, rapid rate tonight, Cardizem given, rate improved, pt feeling better. Pt stable at this time.    1:02 AM consulted Dr. Glo Herring with urology, will see pt in consult.  Will contact hospitalist for admission.  1:15 am consulted  Dr. Shanon Brow, will admit patient here at [facility], team 2, for abdominal pain and hyponatremia  Haron Beilke L. Tayra Dawe, PA-C 01/28/14 0127

## 2014-01-27 NOTE — ED Notes (Signed)
CRITICAL VALUE ALERT  Critical value received: na+ 120  Date of notification:  01/27/2014  Time of notification:  2147  Critical value read back:Yes.    Nurse who received alert:  Randa Evens RN  MD notified (1st page): Tammy Tripplet  Time of first page:  2147  MD notified (2nd page):  Time of second page:  Responding MD:  Lynelle Smoke Tripplet  Time MD responded:  2147

## 2014-01-28 ENCOUNTER — Inpatient Hospital Stay (HOSPITAL_COMMUNITY): Payer: Medicaid Other

## 2014-01-28 DIAGNOSIS — D62 Acute posthemorrhagic anemia: Secondary | ICD-10-CM | POA: Diagnosis present

## 2014-01-28 DIAGNOSIS — N2889 Other specified disorders of kidney and ureter: Secondary | ICD-10-CM | POA: Insufficient documentation

## 2014-01-28 DIAGNOSIS — N10 Acute tubulo-interstitial nephritis: Secondary | ICD-10-CM | POA: Diagnosis present

## 2014-01-28 DIAGNOSIS — I4891 Unspecified atrial fibrillation: Secondary | ICD-10-CM

## 2014-01-28 DIAGNOSIS — Z87891 Personal history of nicotine dependence: Secondary | ICD-10-CM | POA: Diagnosis not present

## 2014-01-28 DIAGNOSIS — I252 Old myocardial infarction: Secondary | ICD-10-CM | POA: Diagnosis not present

## 2014-01-28 DIAGNOSIS — I480 Paroxysmal atrial fibrillation: Secondary | ICD-10-CM | POA: Diagnosis present

## 2014-01-28 DIAGNOSIS — C649 Malignant neoplasm of unspecified kidney, except renal pelvis: Secondary | ICD-10-CM | POA: Diagnosis present

## 2014-01-28 DIAGNOSIS — E785 Hyperlipidemia, unspecified: Secondary | ICD-10-CM | POA: Diagnosis present

## 2014-01-28 DIAGNOSIS — D500 Iron deficiency anemia secondary to blood loss (chronic): Secondary | ICD-10-CM | POA: Diagnosis present

## 2014-01-28 DIAGNOSIS — R52 Pain, unspecified: Secondary | ICD-10-CM

## 2014-01-28 DIAGNOSIS — I739 Peripheral vascular disease, unspecified: Secondary | ICD-10-CM | POA: Diagnosis present

## 2014-01-28 DIAGNOSIS — R310 Gross hematuria: Secondary | ICD-10-CM | POA: Diagnosis present

## 2014-01-28 DIAGNOSIS — Z9861 Coronary angioplasty status: Secondary | ICD-10-CM | POA: Diagnosis not present

## 2014-01-28 DIAGNOSIS — I10 Essential (primary) hypertension: Secondary | ICD-10-CM | POA: Diagnosis present

## 2014-01-28 DIAGNOSIS — E871 Hypo-osmolality and hyponatremia: Secondary | ICD-10-CM | POA: Diagnosis present

## 2014-01-28 DIAGNOSIS — I2589 Other forms of chronic ischemic heart disease: Secondary | ICD-10-CM

## 2014-01-28 DIAGNOSIS — I2510 Atherosclerotic heart disease of native coronary artery without angina pectoris: Secondary | ICD-10-CM | POA: Diagnosis present

## 2014-01-28 DIAGNOSIS — R1084 Generalized abdominal pain: Secondary | ICD-10-CM

## 2014-01-28 DIAGNOSIS — N390 Urinary tract infection, site not specified: Secondary | ICD-10-CM | POA: Insufficient documentation

## 2014-01-28 DIAGNOSIS — C7800 Secondary malignant neoplasm of unspecified lung: Secondary | ICD-10-CM | POA: Diagnosis present

## 2014-01-28 DIAGNOSIS — N3000 Acute cystitis without hematuria: Secondary | ICD-10-CM

## 2014-01-28 DIAGNOSIS — I517 Cardiomegaly: Secondary | ICD-10-CM

## 2014-01-28 DIAGNOSIS — Z8249 Family history of ischemic heart disease and other diseases of the circulatory system: Secondary | ICD-10-CM | POA: Diagnosis not present

## 2014-01-28 DIAGNOSIS — E861 Hypovolemia: Secondary | ICD-10-CM | POA: Diagnosis present

## 2014-01-28 DIAGNOSIS — R109 Unspecified abdominal pain: Secondary | ICD-10-CM | POA: Insufficient documentation

## 2014-01-28 DIAGNOSIS — Z7902 Long term (current) use of antithrombotics/antiplatelets: Secondary | ICD-10-CM | POA: Diagnosis not present

## 2014-01-28 DIAGNOSIS — N289 Disorder of kidney and ureter, unspecified: Secondary | ICD-10-CM

## 2014-01-28 HISTORY — DX: Acute pyelonephritis: N10

## 2014-01-28 LAB — BASIC METABOLIC PANEL
ANION GAP: 12 (ref 5–15)
BUN: 7 mg/dL (ref 6–23)
CALCIUM: 8.1 mg/dL — AB (ref 8.4–10.5)
CHLORIDE: 90 meq/L — AB (ref 96–112)
CO2: 22 mEq/L (ref 19–32)
Creatinine, Ser: 0.76 mg/dL (ref 0.50–1.10)
GFR calc non Af Amer: 89 mL/min — ABNORMAL LOW (ref >90)
Glucose, Bld: 125 mg/dL — ABNORMAL HIGH (ref 70–99)
Potassium: 3.3 mEq/L — ABNORMAL LOW (ref 3.7–5.3)
SODIUM: 124 meq/L — AB (ref 137–147)

## 2014-01-28 LAB — CBC
HCT: 29.2 % — ABNORMAL LOW (ref 36.0–46.0)
Hemoglobin: 10.1 g/dL — ABNORMAL LOW (ref 12.0–15.0)
MCH: 30.7 pg (ref 26.0–34.0)
MCHC: 34.6 g/dL (ref 30.0–36.0)
MCV: 88.8 fL (ref 78.0–100.0)
PLATELETS: 355 10*3/uL (ref 150–400)
RBC: 3.29 MIL/uL — ABNORMAL LOW (ref 3.87–5.11)
RDW: 15.3 % (ref 11.5–15.5)
WBC: 10.7 10*3/uL — AB (ref 4.0–10.5)

## 2014-01-28 LAB — PROTIME-INR
INR: 1.17 (ref 0.00–1.49)
PROTHROMBIN TIME: 14.9 s (ref 11.6–15.2)

## 2014-01-28 MED ORDER — POTASSIUM CHLORIDE IN NACL 20-0.9 MEQ/L-% IV SOLN
INTRAVENOUS | Status: AC
Start: 2014-01-28 — End: 2014-01-28
  Administered 2014-01-28: 03:00:00 via INTRAVENOUS

## 2014-01-28 MED ORDER — DEXTROSE 5 % IV SOLN
INTRAVENOUS | Status: AC
  Filled 2014-01-28: qty 10

## 2014-01-28 MED ORDER — METOPROLOL TARTRATE 50 MG PO TABS
50.0000 mg | ORAL_TABLET | Freq: Two times a day (BID) | ORAL | Status: DC
  Administered 2014-01-28 – 2014-01-31 (×8): 50 mg via ORAL
  Filled 2014-01-28 (×8): qty 1

## 2014-01-28 MED ORDER — FUROSEMIDE 20 MG PO TABS
20.0000 mg | ORAL_TABLET | Freq: Once | ORAL | Status: AC
  Administered 2014-01-28: 20 mg via ORAL
  Filled 2014-01-28: qty 1

## 2014-01-28 MED ORDER — TIOTROPIUM BROMIDE MONOHYDRATE 18 MCG IN CAPS
18.0000 ug | ORAL_CAPSULE | Freq: Every day | RESPIRATORY_TRACT | Status: DC
  Administered 2014-01-28 – 2014-01-31 (×4): 18 ug via RESPIRATORY_TRACT
  Filled 2014-01-28: qty 5

## 2014-01-28 MED ORDER — ALBUTEROL SULFATE (2.5 MG/3ML) 0.083% IN NEBU: 3.0000 mL | INHALATION_SOLUTION | RESPIRATORY_TRACT | Status: DC | PRN

## 2014-01-28 MED ORDER — SODIUM CHLORIDE 0.9 % IV SOLN: INTRAVENOUS | Status: DC

## 2014-01-28 MED ORDER — ESCITALOPRAM OXALATE 10 MG PO TABS
10.0000 mg | ORAL_TABLET | Freq: Every day | ORAL | Status: DC
  Administered 2014-01-28 – 2014-01-31 (×4): 10 mg via ORAL
  Filled 2014-01-28 (×4): qty 1

## 2014-01-28 MED ORDER — SODIUM CHLORIDE 0.9 % IV SOLN
Freq: Once | INTRAVENOUS | Status: AC
  Administered 2014-01-28: 02:00:00 via INTRAVENOUS

## 2014-01-28 MED ORDER — MOMETASONE FURO-FORMOTEROL FUM 200-5 MCG/ACT IN AERO
INHALATION_SPRAY | RESPIRATORY_TRACT | Status: AC
Start: 2014-01-28 — End: 2014-01-28
  Filled 2014-01-28: qty 8.8

## 2014-01-28 MED ORDER — ALBUTEROL SULFATE (2.5 MG/3ML) 0.083% IN NEBU: 3.0000 mL | INHALATION_SOLUTION | Freq: Four times a day (QID) | RESPIRATORY_TRACT | Status: DC | PRN

## 2014-01-28 MED ORDER — POTASSIUM CHLORIDE CRYS ER 20 MEQ PO TBCR
20.0000 meq | EXTENDED_RELEASE_TABLET | Freq: Two times a day (BID) | ORAL | Status: DC
  Administered 2014-01-28 (×2): 20 meq via ORAL
  Filled 2014-01-28 (×3): qty 1

## 2014-01-28 MED ORDER — BIOTENE DRY MOUTH MT LIQD
15.0000 mL | Freq: Two times a day (BID) | OROMUCOSAL | Status: DC
  Administered 2014-01-28 – 2014-01-31 (×6): 15 mL via OROMUCOSAL

## 2014-01-28 MED ORDER — CEFTRIAXONE SODIUM 1 G IJ SOLR
1.0000 g | INTRAMUSCULAR | Status: DC
  Administered 2014-01-28 – 2014-01-30 (×4): 1 g via INTRAVENOUS
  Filled 2014-01-28 (×5): qty 10

## 2014-01-28 MED ORDER — IOHEXOL 300 MG/ML  SOLN
64.0000 mL | Freq: Once | INTRAMUSCULAR | Status: AC | PRN
  Administered 2014-01-28: 64 mL via INTRAVENOUS

## 2014-01-28 MED ORDER — MOMETASONE FURO-FORMOTEROL FUM 200-5 MCG/ACT IN AERO
2.0000 | INHALATION_SPRAY | Freq: Two times a day (BID) | RESPIRATORY_TRACT | Status: DC
  Administered 2014-01-28 – 2014-01-31 (×6): 2 via RESPIRATORY_TRACT
  Filled 2014-01-28: qty 8.8

## 2014-01-28 MED ORDER — ALBUTEROL SULFATE (2.5 MG/3ML) 0.083% IN NEBU
2.5000 mg | INHALATION_SOLUTION | Freq: Three times a day (TID) | RESPIRATORY_TRACT | Status: DC
  Administered 2014-01-28 (×2): 2.5 mg via RESPIRATORY_TRACT
  Filled 2014-01-28 (×2): qty 3

## 2014-01-28 MED ORDER — MORPHINE SULFATE 4 MG/ML IJ SOLN
4.0000 mg | Freq: Once | INTRAMUSCULAR | Status: AC
Start: 2014-01-28 — End: 2014-01-28
  Administered 2014-01-28: 4 mg via INTRAVENOUS
  Filled 2014-01-28: qty 1

## 2014-01-28 MED ORDER — SIMVASTATIN 20 MG PO TABS
20.0000 mg | ORAL_TABLET | Freq: Every evening | ORAL | Status: DC
  Administered 2014-01-28: 20 mg via ORAL
  Filled 2014-01-28: qty 1

## 2014-01-28 MED ORDER — LEVALBUTEROL HCL 0.63 MG/3ML IN NEBU: 0.6300 mg | INHALATION_SOLUTION | RESPIRATORY_TRACT | Status: DC | PRN

## 2014-01-28 MED ORDER — MORPHINE SULFATE 2 MG/ML IJ SOLN
1.0000 mg | INTRAMUSCULAR | Status: DC | PRN
  Administered 2014-01-28: 1 mg via INTRAVENOUS
  Filled 2014-01-28: qty 1

## 2014-01-28 MED ORDER — MAGIC MOUTHWASH
15.0000 mL | Freq: Three times a day (TID) | ORAL | Status: AC
  Administered 2014-01-28 – 2014-01-30 (×8): 15 mL via ORAL
  Filled 2014-01-28 (×10): qty 15

## 2014-01-28 MED ORDER — ONDANSETRON HCL 4 MG/2ML IJ SOLN: 4.0000 mg | Freq: Four times a day (QID) | INTRAMUSCULAR | Status: DC | PRN

## 2014-01-28 MED ORDER — LEVALBUTEROL HCL 0.63 MG/3ML IN NEBU
0.6300 mg | INHALATION_SOLUTION | Freq: Three times a day (TID) | RESPIRATORY_TRACT | Status: DC
  Administered 2014-01-29 – 2014-01-31 (×7): 0.63 mg via RESPIRATORY_TRACT
  Filled 2014-01-28 (×8): qty 3

## 2014-01-28 MED ORDER — ONDANSETRON HCL 4 MG PO TABS: 4.0000 mg | ORAL_TABLET | Freq: Four times a day (QID) | ORAL | Status: DC | PRN

## 2014-01-28 NOTE — Progress Notes (Signed)
IR aware of request to evaluate for possible biopsy of newly found renal mass. Imaging and chart reviewed with Dr. Annamaria Boots. Large left renal mass is consistent with renal cell carcinoma based on imaging appearance. Biopsy not necessary. Pt also on Plavix for CAD, currently held. Recommend Urology consultation.  Ascencion Dike PA-C Interventional Radiology 01/28/2014 8:39 AM

## 2014-01-28 NOTE — Progress Notes (Signed)
  Echocardiogram 2D Echocardiogram has been performed.  Janalee Dane M 01/28/2014, 11:20 AM

## 2014-01-28 NOTE — H&P (Signed)
PCP:   Michaell Cowing, MD   Chief Complaint:  abd pain  HPI: 63 yo female h/o copd, CAD on plavix, pvd, ICM, afib comes in with 5 days of epigastric abd pain with dysuria and hematuria.  She called a teledoc on the internet then who diagnosed her with a uti and called her in bactrim and advised her to f/u with her pcp.  The epigastric abd pain persisted and worsened with one episode of vomiting today nonbloody.  Denies fevers.  No chills.  No cough, no hemoptysis.  The gross hematuria seemed to improve and resolve, but this am she started passing more blood with clots.  The dysuria has persisted.  Denies weight loss.  No flank pain or back pain.  Review of Systems:  Positive and negative as per HPI otherwise all other systems are negative  Past Medical History: Past Medical History  Diagnosis Date  . Hypertension   . COPD (chronic obstructive pulmonary disease)   . Tobacco abuse     hx of  . Coronary artery disease     PCI, DES circumflex 2/11. moderate disease LAD  . Hyperlipidemia   . PVD (peripheral vascular disease)     lower extremity runoff 05/08/10  . Ischemic cardiomyopathy     Echo 08/14/09 with LVEF 40-45%.   Edrick Kins    Past Surgical History  Procedure Laterality Date  . Tonsillectomy    . Coronary stent placement    . Femoral bypass Left May 2012  . Lower extremity angiogram  December 16, 2011    Medications: Prior to Admission medications   Medication Sig Start Date End Date Taking? Authorizing Provider  albuterol (PROVENTIL HFA;VENTOLIN HFA) 108 (90 BASE) MCG/ACT inhaler Inhale 2 puffs into the lungs every 6 (six) hours as needed for wheezing. 11/09/13  Yes Brand Males, MD  clopidogrel (PLAVIX) 75 MG tablet Take 1 tablet (75 mg total) by mouth daily. 03/02/13  Yes Burnell Blanks, MD  Cranberry 450 MG CAPS Take 1 capsule by mouth daily.   Yes Historical Provider, MD  escitalopram (LEXAPRO) 10 MG tablet Take 1 tablet (10 mg total) by mouth daily. 09/22/13  Yes  Burnell Blanks, MD  Fluticasone Furoate-Vilanterol (BREO ELLIPTA) 100-25 MCG/INH AEPB Inhale 1 puff into the lungs daily. Take 1 puff daily 11/09/13  Yes Brand Males, MD  Ibuprofen-Diphenhydramine HCl (ADVIL PM) 200-25 MG CAPS Take 1 capsule by mouth at bedtime.   Yes Historical Provider, MD  losartan (COZAAR) 100 MG tablet Take 1 tablet (100 mg total) by mouth daily. 01/23/14  Yes Burnell Blanks, MD  metoprolol (LOPRESSOR) 50 MG tablet Take 1 tablet (50 mg total) by mouth 2 (two) times daily. 12/27/13  Yes Burnell Blanks, MD  simvastatin (ZOCOR) 20 MG tablet Take 1 tablet (20 mg total) by mouth every evening. 03/02/13 03/02/14 Yes Burnell Blanks, MD  sulfamethoxazole-trimethoprim (BACTRIM DS) 800-160 MG per tablet Take 1 tablet by mouth 2 (two) times daily. 5 day course starting on 01/23/2014   Yes Historical Provider, MD  tiotropium (SPIRIVA) 18 MCG inhalation capsule Place 1 capsule (18 mcg total) into inhaler and inhale daily. 08/26/13  Yes Brand Males, MD  triamterene-hydrochlorothiazide (MAXZIDE-25) 37.5-25 MG per tablet Take 1 tablet by mouth daily. 03/02/13  Yes Burnell Blanks, MD    Allergies:   Allergies  Allergen Reactions  . Ace Inhibitors Cough  . Amoxicillin-Pot Clavulanate Hives  . Codeine Nausea And Vomiting    Social History:  reports that she  quit smoking about 4 years ago. Her smoking use included Cigarettes. She has a 45 pack-year smoking history. She has never used smokeless tobacco. She reports that she does not drink alcohol or use illicit drugs.  Family History: Family History  Problem Relation Age of Onset  . Coronary artery disease Mother     deceased. coronary artery bypass graft  . Cancer Mother     BREAST  . Heart disease Mother     PVD  - Amputation-Leg  . Hyperlipidemia Mother   . Hypertension Mother   . Heart attack Mother   . Cirrhosis Father     deceased  . Cancer Father   . Other Brother     alive,  unknown hx    Physical Exam: Filed Vitals:   01/28/14 0130 01/28/14 0145 01/28/14 0200 01/28/14 0236  BP: 120/63 120/67 134/81 125/51  Pulse:   99 105  Temp:    97.9 F (36.6 C)  TempSrc:    Oral  Resp: 12 11  20   Height:    5\' 7"  (1.702 m)  Weight:      SpO2:   99% 100%   General appearance: alert, cooperative and no distress Head: Normocephalic, without obvious abnormality, atraumatic Eyes: negative Nose: Nares normal. Septum midline. Mucosa normal. No drainage or sinus tenderness. Neck: no JVD and supple, symmetrical, trachea midline Lungs: clear to auscultation bilaterally Heart: regular rate and rhythm, S1, S2 normal, no murmur, click, rub or gallop Abdomen: soft, non-tender; bowel sounds normal; no masses,  no organomegaly Extremities: extremities normal, atraumatic, no cyanosis or edema Pulses: 2+ and symmetric Skin: Skin color, texture, turgor normal. No rashes or lesions Neurologic: Grossly normal  Labs on Admission:   Recent Labs  01/27/14 2109  NA 120*  K 3.3*  CL 84*  CO2 19  GLUCOSE 121*  BUN 10  CREATININE 0.82  CALCIUM 9.0    Recent Labs  01/27/14 2109  AST 18  ALT 12  ALKPHOS 106  BILITOT 0.4  PROT 7.1  ALBUMIN 3.5    Recent Labs  01/27/14 2109  WBC 13.3*  NEUTROABS 11.2*  HGB 12.4  HCT 34.8*  MCV 87.9  PLT 360    Radiological Exams on Admission: Ct Abdomen Pelvis W Contrast  01/28/2014   CLINICAL DATA:  Lower abdominal pain.  Hematuria.  EXAM: CT ABDOMEN AND PELVIS WITH CONTRAST  TECHNIQUE: Multidetector CT imaging of the abdomen and pelvis was performed using the standard protocol following bolus administration of intravenous contrast.  CONTRAST:  167mL OMNIPAQUE IOHEXOL 300 MG/ML  SOLN  COMPARISON:  None.  FINDINGS: There is a 1.6 cm nodule at the right lung base (image 11 of 22). This may reflect metastatic disease. Mild bibasilar atelectasis is noted.  The liver and spleen are unremarkable in appearance. The gallbladder is  within normal limits. The pancreas and adrenal glands are unremarkable.  There is a large peripherally enhancing centrally necrotic mass arising at the anterior aspect of the left kidney. It measures approximately 6.4 x 5.4 x 5.9 cm, and is unusually lobulated in appearance. This is compatible with a renal cell carcinoma. Surrounding soft tissue inflammation is seen. Surrounding angiogenesis is suggested.  Left-sided perinephric stranding and fluid are seen, and mild stranding is seen tracking along the course of the left ureter. This may reflect underlying pyelonephritis, or may be reactive secondary to the large mass.  A 2.1 cm cyst is noted near the upper pole of the right kidney. A few small nonobstructing  stones are seen at the upper pole of the right kidney.  No free fluid is identified. The small bowel is unremarkable in appearance. The stomach is within normal limits. No acute vascular abnormalities are seen. Relatively diffuse calcification is seen along the abdominal aorta and its branches.  The appendix is normal in caliber and contains air, without evidence for appendicitis. The colon is largely decompressed and grossly unremarkable in appearance.  The bladder is largely decompressed and grossly unremarkable. The uterus is within normal limits. The ovaries are relatively symmetric. No suspicious adnexal masses are seen. No inguinal lymphadenopathy is seen.  No acute osseous abnormalities are identified. A few tiny bone islands are noted within the visualized osseous structures.  IMPRESSION: 1. 6.4 x 5.4 x 5.9 cm peripherally enhancing centrally necrotic mass along the anterior aspect of the left kidney, suspicious for a renal cell carcinoma. Surrounding soft tissue inflammation seen. Surrounding angiogenesis suggested. 2. Left renal perinephric stranding and fluid noted, with mild stranding about the left ureter. This may reflect underlying pyelonephritis, or may be reactive secondary to the large mass.  3. 1.6 cm nodule noted at the right lung base. This raises concern for metastatic disease. 4. Right renal cyst seen. Few small nonobstructing right renal stones noted. 5. Mild bibasilar atelectasis noted. 6. Relatively diffuse calcification along the abdominal aorta and its branches.   Electronically Signed   By: Garald Balding M.D.   On: 01/28/2014 00:26   Dg Chest Portable 1 View  01/27/2014   CLINICAL DATA:  ABDOMINAL PAIN VAGINAL BLEEDING  EXAM: PORTABLE CHEST - 1 VIEW  COMPARISON:  11/12/2010  FINDINGS: Heart size upper limits normal. Prominent linear left perihilar and right basilar opacities, increased since previous. No confluent airspace disease. No effusion. Regional bones unremarkable.  IMPRESSION: 1. Some increase in asymmetric linear parenchymal opacities as above.   Electronically Signed   By: Arne Cleveland M.D.   On: 01/27/2014 21:38    Assessment/Plan  63 yo female with gross hematuria, dysuria and large left renal mass concerning for underlying malignancy  Principal Problem:   Hyponatremia-  Like from use of her maxzide.  Will stop this.  Has gotten 2 liters of ivf.  Cont ivf overnight, repeat in am.    Active Problems:   COPD, severe - MS phenotype   Ischemic cardiomyopathy   Abdominal pain, acute   UTI (urinary tract infection)   Place on rocephin.  Urine culture sent.   ??pyelonephritis but abd pain is very atypical for pyelo.   Hematuria, gross   Left renal mass   Consult IR for evaluation if biopsy possible.  Will also ct chest to r/o metastatic disease.  Pt aware this is concerning for malignancy.  May ultimately need transfer to [facility] for urology evaluation/nephrectomy   Atrial fibrillation-  monitor  CAD-  On plavix, hold for now for plan to biopsy.    Admit to tele.  Full code.  DAVID,RACHAL A 01/28/2014, 2:49 AM

## 2014-01-28 NOTE — Progress Notes (Signed)
The patient is a 63 year old woman with a history of CAD, ICM, chronic atrial fibrillation, and severe COPD, who was admitted this morning by Dr. Shanon Brow for abdominal pain and gross hematuria. CT of the abdomen and pelvis revealed a large left renal mass. Other studies significant for an acute pyelonephritis/UTI, hypokalemia, hyponatremia, and anemia. The patient was briefly seen and examined. She is requesting albuterol nebulizers for a few wheezes-ordered. She also complains of a dry sore mouth, but there was no sign of oral thrush on exam. Magic mouthwash ordered. CT scan of the chest is pending to evaluate for metastatic disease. IR PA, Mr. Vincent Gros assessment noted and per radiologist, Dr. Reesa Chew, the imaging is consistent with renal cell carcinoma. They recommend a urology consult. Urology we will consulted. We'll also order a 2-D echocardiogram to assess the patient's left ventricular systolic function given that she may need surgery in the near future.

## 2014-01-28 NOTE — ED Provider Notes (Signed)
Medical screening examination/treatment/procedure(s) were conducted as a shared visit with non-physician practitioner(s) and myself.  I personally evaluated the patient during the encounter.   EKG Interpretation   Date/Time:  Friday January 27 2014 21:06:33 EDT Ventricular Rate:  132 PR Interval:    QRS Duration: 91 QT Interval:  330 QTC Calculation: 489 R Axis:   68 Text Interpretation:  Atrial fibrillation Ventricular premature complex  Borderline ST depression, anterolateral leads Borderline prolonged QT  interval Baseline wander in lead(s) V4 V6 Confirmed by Taylynn Easton  MD, Dyllan Kats  ([id number]) on 01/27/2014 9:36:19 PM     CRITICAL CARE Performed by: Nat Christen Total critical care time: 45 Critical care time was exclusive of separately billable procedures and treating other patients. Critical care was necessary to treat or prevent imminent or life-threatening deterioration. Critical care was time spent personally by me on the following activities: development of treatment plan with patient and/or surrogate as well as nursing, discussions with consultants, evaluation of patient's response to treatment, examination of patient, obtaining history from patient or surrogate, ordering and performing treatments and interventions, ordering and review of laboratory studies, ordering and review of radiographic studies, pulse oximetry and re-evaluation of patient's condition.  Patient has several medical issues including rapid atrial fibrillation, hyponatremia, left renal mass.  IV Cardizem, normal saline, consultation with urology. Admit to general medicine.  Nat Christen, MD 01/28/14 [id number]

## 2014-01-29 DIAGNOSIS — J449 Chronic obstructive pulmonary disease, unspecified: Secondary | ICD-10-CM

## 2014-01-29 DIAGNOSIS — D500 Iron deficiency anemia secondary to blood loss (chronic): Secondary | ICD-10-CM

## 2014-01-29 DIAGNOSIS — C649 Malignant neoplasm of unspecified kidney, except renal pelvis: Secondary | ICD-10-CM

## 2014-01-29 DIAGNOSIS — N10 Acute tubulo-interstitial nephritis: Principal | ICD-10-CM

## 2014-01-29 DIAGNOSIS — C7800 Secondary malignant neoplasm of unspecified lung: Secondary | ICD-10-CM

## 2014-01-29 HISTORY — DX: Malignant neoplasm of unspecified kidney, except renal pelvis: C64.9

## 2014-01-29 LAB — BASIC METABOLIC PANEL
Anion gap: 12 (ref 5–15)
BUN: 6 mg/dL (ref 6–23)
CALCIUM: 8.8 mg/dL (ref 8.4–10.5)
CO2: 25 mEq/L (ref 19–32)
Chloride: 96 mEq/L (ref 96–112)
Creatinine, Ser: 0.71 mg/dL (ref 0.50–1.10)
GFR calc Af Amer: >90 mL/min (ref >90)
Glucose, Bld: 109 mg/dL — ABNORMAL HIGH (ref 70–99)
Potassium: 3.4 mEq/L — ABNORMAL LOW (ref 3.7–5.3)
Sodium: 133 mEq/L — ABNORMAL LOW (ref 137–147)

## 2014-01-29 LAB — CBC
HCT: 28.4 % — ABNORMAL LOW (ref 36.0–46.0)
Hemoglobin: 9.5 g/dL — ABNORMAL LOW (ref 12.0–15.0)
MCH: 30.8 pg (ref 26.0–34.0)
MCHC: 33.5 g/dL (ref 30.0–36.0)
MCV: 92.2 fL (ref 78.0–100.0)
PLATELETS: 328 10*3/uL (ref 150–400)
RBC: 3.08 MIL/uL — ABNORMAL LOW (ref 3.87–5.11)
RDW: 16 % — AB (ref 11.5–15.5)
WBC: 8.9 10*3/uL (ref 4.0–10.5)

## 2014-01-29 MED ORDER — POTASSIUM CHLORIDE 20 MEQ/15ML (10%) PO LIQD
20.0000 meq | Freq: Two times a day (BID) | ORAL | Status: DC
  Administered 2014-01-29 – 2014-01-31 (×5): 20 meq via ORAL
  Filled 2014-01-29 (×5): qty 30

## 2014-01-29 MED ORDER — ALPRAZOLAM 0.5 MG PO TABS
0.5000 mg | ORAL_TABLET | Freq: Four times a day (QID) | ORAL | Status: DC | PRN
  Administered 2014-01-29 – 2014-01-30 (×4): 0.5 mg via ORAL
  Filled 2014-01-29 (×4): qty 1

## 2014-01-29 MED ORDER — DILTIAZEM HCL 30 MG PO TABS
30.0000 mg | ORAL_TABLET | Freq: Two times a day (BID) | ORAL | Status: DC
  Administered 2014-01-29 – 2014-01-30 (×3): 30 mg via ORAL
  Filled 2014-01-29 (×4): qty 1

## 2014-01-29 MED ORDER — ATORVASTATIN CALCIUM 10 MG PO TABS
10.0000 mg | ORAL_TABLET | Freq: Every day | ORAL | Status: DC
  Administered 2014-01-29 – 2014-01-31 (×3): 10 mg via ORAL
  Filled 2014-01-29 (×3): qty 1

## 2014-01-29 NOTE — Progress Notes (Signed)
PT asleep at 2300 no neb given

## 2014-01-29 NOTE — Progress Notes (Signed)
Utilization review Completed Rhyann Berton RN BSN   

## 2014-01-29 NOTE — Progress Notes (Signed)
[facility] PROGRESS NOTE  Allison [name] HQI:[id number] DOB: January 01, 1951 DOA: 01/27/2014 PCP: Michaell Cowing, MD    Code Status: Full code Family Communication: Communicates in the son pending Disposition Plan: Discharge to home when clinically appropriate   Consultants:  Oncology pending  Urology pending  Procedures:  2-D echocardiogram: 7/25: Results pending  Antibiotics:  Rocephin 7/25>>  HPI/Subjective: The patient is sitting up in bed, in no acute distress. She says that she is breathing better. She has no flank pain at rest. Her urine has cleared and is no longer bloody.    Objective: Filed Vitals:   01/29/14 1048  BP: 123/75  Pulse: 123  Temp:   Resp:    Temperature 99.0. Blood pressure 123/75. Oxygen saturation 95%.    Intake/Output Summary (Last 24 hours) at 01/29/14 1134 Last data filed at 01/29/14 1100  Gross per 24 hour  Intake   1130 ml  Output   3101 ml  Net  -1971 ml   Filed Weights   01/27/14 2059 01/28/14 0430  Weight: 77.111 kg (170 lb) 78.5 kg (173 lb 1 oz)    Exam:   General: Alert 63 year old Caucasian woman, sitting up in bed, in no acute distress.  Cardiovascular: Irregular, irregular, with tachycardia.  Respiratory: Rare wheezes in the mid lobes, breathing nonlabored.  Abdomen: Positive bowel sounds, soft, mild left flank tenderness. No masses palpated. No distention.  Musculoskeletal/extremities: No pedal edema. No acute hot red joints.  Neurologic/psychiatric: The patient is alert and oriented x3. Cranial nerves II through XII are intact. She became tearful when I informed her that she had metastatic renal cell carcinoma until proven otherwise.   Data Reviewed: Basic Metabolic Panel:  Recent Labs Lab 01/27/14 2109 01/28/14 0541 01/29/14 0542  NA 120* 124* 133*  K 3.3* 3.3* 3.4*  CL 84* 90* 96  CO2 19 22 25   GLUCOSE 121* 125* 109*  BUN 10 7 6   CREATININE 0.82 0.76 0.71  CALCIUM 9.0 8.1* 8.8   Liver Function  Tests:  Recent Labs Lab 01/27/14 2109  AST 18  ALT 12  ALKPHOS 106  BILITOT 0.4  PROT 7.1  ALBUMIN 3.5   No results found for this basename: LIPASE, AMYLASE,  in the last 168 hours No results found for this basename: AMMONIA,  in the last 168 hours CBC:  Recent Labs Lab 01/27/14 2109 01/28/14 0541 01/29/14 0542  WBC 13.3* 10.7* 8.9  NEUTROABS 11.2*  --   --   HGB 12.4 10.1* 9.5*  HCT 34.8* 29.2* 28.4*  MCV 87.9 88.8 92.2  PLT 360 355 328   Cardiac Enzymes: No results found for this basename: CKTOTAL, CKMB, CKMBINDEX, TROPONINI,  in the last 168 hours BNP (last 3 results) No results found for this basename: PROBNP,  in the last 8760 hours CBG: No results found for this basename: GLUCAP,  in the last 168 hours  No results found for this or any previous visit (from the past 240 hour(s)).   Studies: Ct Chest W Contrast  01/28/2014   CLINICAL DATA:  New diagnosis of renal cell carcinoma involving the left kidney. Staging.  EXAM: CT CHEST WITH CONTRAST  TECHNIQUE: Multidetector CT imaging of the chest was performed during intravenous contrast administration.  CONTRAST:  55mL OMNIPAQUE IOHEXOL 300 MG/ML IV.  COMPARISON:  None.  FINDINGS: Emphysematous changes throughout both lungs. Approximate 1.3 x 1.7 x 1.3 cm nodule deep in the right lower lobe just above the right hemidiaphragm (series 3, image 51 and coronal image  39). Approximate 1.0 x 1.5 x 1.2 cm necrotic nodule in the inferior left upper lobe, tethered to the lateral pleural with scarring. Approximate 0.5 cm nodule in the left apex. 2-3 mm nodule peripherally in the right middle lobe. Scarring in the lingula and to a lesser degree the right middle lobe and both lower lobes. No confluent airspace consolidation. Interstitial opacities throughout both lungs, best seen on the coronal reformatted images, with Kerley B-lines. No pleural effusions. Central airways patent with mild bronchial wall thickening.  Normal sized lymph nodes  in the mediastinum, hila, and both axillae. No significant lymphadenopathy. Visualized thyroid gland normal.  Heart mildly enlarged. Moderate to severe LAD and left circumflex coronary atherosclerosis. No pericardial effusion. Severe atherosclerosis involving the thoracic and upper abdominal aorta. Soft plaque at the origin of the right common carotid artery without significant stenosis.  Bone window images demonstrate mild thoracic spondylosis but no evidence of osseous metastatic disease. Please see the report of the CT abdomen and pelvis yesterday for details of the abdominal findings.  IMPRESSION: 1. At least 3 pulmonary nodules, the largest in the deep right lower lobe and the inferior left upper lobe, measured above. This is consistent with metastatic disease. 2. No evidence of metastatic lymphadenopathy in the thorax. 3. COPD/emphysema. Diffuse interstitial pulmonary opacities likely represent mild edema. 4. Mild cardiomegaly. Moderate to severe LAD and left circumflex coronary atherosclerosis. 5. No evidence of osseous metastatic disease. 6. Please see the report of the CT abdomen and pelvis yesterday for details of the abdominal findings.   Electronically Signed   By: Evangeline Dakin M.D.   On: 01/28/2014 11:24   Ct Abdomen Pelvis W Contrast  01/28/2014   CLINICAL DATA:  Lower abdominal pain.  Hematuria.  EXAM: CT ABDOMEN AND PELVIS WITH CONTRAST  TECHNIQUE: Multidetector CT imaging of the abdomen and pelvis was performed using the standard protocol following bolus administration of intravenous contrast.  CONTRAST:  16mL OMNIPAQUE IOHEXOL 300 MG/ML  SOLN  COMPARISON:  None.  FINDINGS: There is a 1.6 cm nodule at the right lung base (image 11 of 22). This may reflect metastatic disease. Mild bibasilar atelectasis is noted.  The liver and spleen are unremarkable in appearance. The gallbladder is within normal limits. The pancreas and adrenal glands are unremarkable.  There is a large peripherally  enhancing centrally necrotic mass arising at the anterior aspect of the left kidney. It measures approximately 6.4 x 5.4 x 5.9 cm, and is unusually lobulated in appearance. This is compatible with a renal cell carcinoma. Surrounding soft tissue inflammation is seen. Surrounding angiogenesis is suggested.  Left-sided perinephric stranding and fluid are seen, and mild stranding is seen tracking along the course of the left ureter. This may reflect underlying pyelonephritis, or may be reactive secondary to the large mass.  A 2.1 cm cyst is noted near the upper pole of the right kidney. A few small nonobstructing stones are seen at the upper pole of the right kidney.  No free fluid is identified. The small bowel is unremarkable in appearance. The stomach is within normal limits. No acute vascular abnormalities are seen. Relatively diffuse calcification is seen along the abdominal aorta and its branches.  The appendix is normal in caliber and contains air, without evidence for appendicitis. The colon is largely decompressed and grossly unremarkable in appearance.  The bladder is largely decompressed and grossly unremarkable. The uterus is within normal limits. The ovaries are relatively symmetric. No suspicious adnexal masses are seen. No inguinal lymphadenopathy  is seen.  No acute osseous abnormalities are identified. A few tiny bone islands are noted within the visualized osseous structures.  IMPRESSION: 1. 6.4 x 5.4 x 5.9 cm peripherally enhancing centrally necrotic mass along the anterior aspect of the left kidney, suspicious for a renal cell carcinoma. Surrounding soft tissue inflammation seen. Surrounding angiogenesis suggested. 2. Left renal perinephric stranding and fluid noted, with mild stranding about the left ureter. This may reflect underlying pyelonephritis, or may be reactive secondary to the large mass. 3. 1.6 cm nodule noted at the right lung base. This raises concern for metastatic disease. 4. Right  renal cyst seen. Few small nonobstructing right renal stones noted. 5. Mild bibasilar atelectasis noted. 6. Relatively diffuse calcification along the abdominal aorta and its branches.   Electronically Signed   By: Garald Balding M.D.   On: 01/28/2014 00:26   Dg Chest Portable 1 View  01/27/2014   CLINICAL DATA:  ABDOMINAL PAIN VAGINAL BLEEDING  EXAM: PORTABLE CHEST - 1 VIEW  COMPARISON:  11/12/2010  FINDINGS: Heart size upper limits normal. Prominent linear left perihilar and right basilar opacities, increased since previous. No confluent airspace disease. No effusion. Regional bones unremarkable.  IMPRESSION: 1. Some increase in asymmetric linear parenchymal opacities as above.   Electronically Signed   By: Arne Cleveland M.D.   On: 01/27/2014 21:38    Scheduled Meds: . antiseptic oral rinse  15 mL Mouth Rinse BID  . cefTRIAXone (ROCEPHIN)  IV  1 g Intravenous Q24H  . escitalopram  10 mg Oral Daily  . levalbuterol  0.63 mg Nebulization Q8H  . magic mouthwash  15 mL Oral TID  . metoprolol  50 mg Oral BID  . mometasone-formoterol  2 puff Inhalation BID  . potassium chloride  20 mEq Oral BID  . simvastatin  20 mg Oral QPM  . 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 Left renal mass and pulmonary nodules, secondary to metastatic renal cell carcinoma until proven otherwise. For evaluation of the patient's hematuria, a CT scan of the abdomen and pelvis was ordered on admission. It revealed a 6.4 x 5.4 x 5.9 centrally necrotic mass in the left kidney, suspicious for renal cell carcinoma. CT of the chest was ordered when the CT of the abdomen revealed a 1.6 nodule at the right lung  base. It revealed at least 3 pulmonary nodules in the right lower lobe and left upper lobe consistent with metastatic disease. Interventional radiologist at [facility] did not believe a biopsy was necessary because the imaging was consistent with renal cell carcinoma. The patient was informed of these findings and became tearful. Xanax was ordered when necessary. [facility] consult oncology and urology here. Given her severe COPD and coronary artery disease, she would likely need definitive treatment at [facility] or [facility] where she would be managed by her primary cardiologist, Dr. Angelena Form and primary pulmonologist, Dr. Chase Caller  Acute pyelonephritis. Her urine culture is pending. Her white blood cell count has normalized. We'll continue Rocephin.  Gross hematuria. Secondary to renal cell carcinoma and acute pyelonephritis. Her urine has now cleared. Continue to monitor. Continue to hold Plavix.  Acute  blood loss anemia. Secondary to gross hematuria and hemodilution. We'll continue to monitor. No indication for transfusion currently.  Hyponatremia, secondary to hypovolemia. She was given gentle IV fluids for 24 hours. Her serum sodium was 120 on admission and has improved to 133.  Chronic atrial fibrillation, on Plavix chronically. Plavix is on hold secondary to gross hematuria. Her rate is treated chronically with metoprolol. She does have mild RVR which may be exacerbated by bronchodilators. We'll continue metoprolol and add when necessary diltiazem.  Coronary artery disease with ischemic cardiomyopathy. Currently stable. We'll continue chronic cardiac medications with exception of holding antiplatelet therapy. 2-D echocardiogram ordered to reassess her LVEF.  Hypertension. Her blood pressures have been low-normal. We will continue metoprolol. We'll continue holding Maxzide and losartan until her blood pressure has consistently improved.  Severe COPD. We will continue her  bronchodilators. Albuterol nebulizer changed to Xopenex to decrease RVR.   Time spent: 45 minutes    [facility] Pager [phone] . If 7PM-7AM, please contact night-coverage at [email or web address], password [facility] 01/29/2014, 11:34 AM  LOS: 2 days

## 2014-01-30 ENCOUNTER — Other Ambulatory Visit (HOSPITAL_COMMUNITY): Payer: Self-pay | Admitting: Oncology

## 2014-01-30 ENCOUNTER — Inpatient Hospital Stay (HOSPITAL_COMMUNITY): Payer: Medicaid Other

## 2014-01-30 ENCOUNTER — Encounter (HOSPITAL_COMMUNITY): Payer: Self-pay | Admitting: Radiology

## 2014-01-30 DIAGNOSIS — C649 Malignant neoplasm of unspecified kidney, except renal pelvis: Secondary | ICD-10-CM

## 2014-01-30 DIAGNOSIS — C7802 Secondary malignant neoplasm of left lung: Principal | ICD-10-CM

## 2014-01-30 DIAGNOSIS — F1010 Alcohol abuse, uncomplicated: Secondary | ICD-10-CM

## 2014-01-30 DIAGNOSIS — Z87891 Personal history of nicotine dependence: Secondary | ICD-10-CM

## 2014-01-30 LAB — CBC
HEMATOCRIT: 28.2 % — AB (ref 36.0–46.0)
Hemoglobin: 9.5 g/dL — ABNORMAL LOW (ref 12.0–15.0)
MCH: 31.3 pg (ref 26.0–34.0)
MCHC: 33.7 g/dL (ref 30.0–36.0)
MCV: 92.8 fL (ref 78.0–100.0)
Platelets: 295 10*3/uL (ref 150–400)
RBC: 3.04 MIL/uL — ABNORMAL LOW (ref 3.87–5.11)
RDW: 16 % — AB (ref 11.5–15.5)
WBC: 9.1 10*3/uL (ref 4.0–10.5)

## 2014-01-30 LAB — BASIC METABOLIC PANEL
Anion gap: 10 (ref 5–15)
BUN: 9 mg/dL (ref 6–23)
CO2: 25 mEq/L (ref 19–32)
CREATININE: 0.6 mg/dL (ref 0.50–1.10)
Calcium: 8.8 mg/dL (ref 8.4–10.5)
Chloride: 98 mEq/L (ref 96–112)
Glucose, Bld: 107 mg/dL — ABNORMAL HIGH (ref 70–99)
Potassium: 3.9 mEq/L (ref 3.7–5.3)
Sodium: 133 mEq/L — ABNORMAL LOW (ref 137–147)

## 2014-01-30 MED ORDER — TECHNETIUM TC 99M MEDRONATE IV KIT: 25.0000 | PACK | Freq: Once | INTRAVENOUS | Status: AC | PRN

## 2014-01-30 MED ORDER — POTASSIUM CHLORIDE IN NACL 20-0.9 MEQ/L-% IV SOLN
INTRAVENOUS | Status: DC
  Administered 2014-01-30: 65 mL/h via INTRAVENOUS
  Administered 2014-01-31: 09:00:00 via INTRAVENOUS

## 2014-01-30 MED ORDER — GADOBENATE DIMEGLUMINE 529 MG/ML IV SOLN
15.0000 mL | Freq: Once | INTRAVENOUS | Status: AC | PRN
  Administered 2014-01-30: 15 mL via INTRAVENOUS

## 2014-01-30 NOTE — Progress Notes (Signed)
Notified Dr. Caryn Section of the patients BP 109/62 and HR 102.  Inquired if she wanted the patient to get both the diltiazem and the metoprolol.  MD stated to hold the diltiazem for now and to give the metoprolol.  Orders followed will continue to follow the patient.

## 2014-01-30 NOTE — Progress Notes (Signed)
[facility] PROGRESS NOTE  Allison [name] DJM:[id number] DOB: 1950/07/19 DOA: 01/27/2014 PCP: Michaell Cowing, MD    Code Status: Full code Family Communication: Discuss with husband today and son yesterday. Disposition Plan: Discharge to home when clinically appropriate   Consultants:  Oncology   Urology pending  Procedures: 2-D echocardiogram: 7/25:   Study Conclusions - Left ventricle: The cavity size was normal. Wall thickness was normal. Systolic function was normal. The estimated ejection fraction was in the range of 55% to 60%. Probable hypokinesis of the basal-midinferolateral and inferior myocardium. The study is not technically sufficient to allow evaluation of LV diastolic function. - Aortic valve: Mildly calcified annulus. Trileaflet; mildly calcified leaflets. There was no significant regurgitation. - Mitral valve: Calcified annulus. There was trivial regurgitation. - Left atrium: The atrium was moderately dilated. - Right atrium: The atrium was moderately dilated. Central venous pressure (est): 8 mm Hg. - Tricuspid valve: There was trivial regurgitation. - Pulmonary arteries: Systolic pressure could not be accurately estimated. - Pericardium, extracardiac: There was no pericardial effusion. Impressions: - Normal LV chamber size and wall thickness with LVEF 55-60%, probable hypokinesis of the mid to basal inferolateral wall. Indeterminate diastolic function in the setting of atrial fibrillation. Moderate biatrial enlargement. MAC with trivial mitral regurgitation. Unable to assess PASP.    Antibiotics:  Rocephin 7/25>>  HPI/Subjective: The patient is sitting up in bed, in no acute distress. She was tearful and anxious yesterday, but this has subsided some. She says that she is breathing better. She has no flank pain at rest. Her urine has cleared and is no longer bloody.    Objective: Filed Vitals:   01/30/14 1152  BP: 109/62  Pulse: 102  Temp:    Resp:    Temperature 99.3. Blood pressure 109/62. Oxygen saturation 96%.    Intake/Output Summary (Last 24 hours) at 01/30/14 1408 Last data filed at 01/30/14 0931  Gross per 24 hour  Intake    710 ml  Output    676 ml  Net     34 ml   Filed Weights   01/27/14 2059 01/28/14 0430  Weight: 77.111 kg (170 lb) 78.5 kg (173 lb 1 oz)    Exam:   General: Alert 63 year old Caucasian woman, sitting up in bed, in no acute distress.  Cardiovascular: Irregular, irregular, with borderline tachycardia.  Respiratory: Clear with no audible wheezes or crackles currently, breathing nonlabored.  Abdomen: Positive bowel sounds, soft, mild left flank tenderness. No masses palpated. No distention.  Musculoskeletal/extremities: No pedal edema. No acute hot red joints.  Neurologic/psychiatric: The patient is alert and oriented x3. Cranial nerves II through XII are intact. She is not tearful today.   Data Reviewed: Basic Metabolic Panel:  Recent Labs Lab 01/27/14 2109 01/28/14 0541 01/29/14 0542 01/30/14 0708  NA 120* 124* 133* 133*  K 3.3* 3.3* 3.4* 3.9  CL 84* 90* 96 98  CO2 19 22 25 25   GLUCOSE 121* 125* 109* 107*  BUN 10 7 6 9   CREATININE 0.82 0.76 0.71 0.60  CALCIUM 9.0 8.1* 8.8 8.8   Liver Function Tests:  Recent Labs Lab 01/27/14 2109  AST 18  ALT 12  ALKPHOS 106  BILITOT 0.4  PROT 7.1  ALBUMIN 3.5   No results found for this basename: LIPASE, AMYLASE,  in the last 168 hours No results found for this basename: AMMONIA,  in the last 168 hours CBC:  Recent Labs Lab 01/27/14 2109 01/28/14 0541 01/29/14 0542 01/30/14 0708  WBC 13.3* 10.7*  8.9 9.1  NEUTROABS 11.2*  --   --   --   HGB 12.4 10.1* 9.5* 9.5*  HCT 34.8* 29.2* 28.4* 28.2*  MCV 87.9 88.8 92.2 92.8  PLT 360 355 328 295   Cardiac Enzymes: No results found for this basename: CKTOTAL, CKMB, CKMBINDEX, TROPONINI,  in the last 168 hours BNP (last 3 results) No results found for this basename: PROBNP,   in the last 8760 hours CBG: No results found for this basename: GLUCAP,  in the last 168 hours  No results found for this or any previous visit (from the past 240 hour(s)).   Studies: Mr Kizzie Fantasia Contrast  03-01-2014   CLINICAL DATA:  Kidney cancer. Difficulty ambulating. Hypertension. Hyperlipidemia.  EXAM: MRI HEAD WITHOUT AND WITH CONTRAST  TECHNIQUE: Multiplanar, multiecho pulse sequences of the brain and surrounding structures were obtained without and with intravenous contrast.  CONTRAST:  46mL MULTIHANCE GADOBENATE DIMEGLUMINE 529 MG/ML IV SOLN  COMPARISON:  None.  FINDINGS: Exam is motion degraded.  No parenchymal enhancing lesion noted on this motion degraded examination.  Heterogeneous appearance of bone marrow without definitive osseous lesion.  No acute infarct.  No intracranial hemorrhage.  Mild small vessel disease type changes.  Mild atrophy without hydrocephalus.  Partially empty sella incidentally noted.  Cervical medullary junction and pineal region unremarkable. Orbital structures within normal limits.  Frontal sinus and ethmoid sinus air cell minimal to mild mucosal thickening.  Major intracranial vascular structures are patent.  IMPRESSION: Exam is motion degraded.  No parenchymal enhancing lesion noted on this motion degraded examination.  Heterogeneous appearance of bone marrow without definitive osseous lesion.  No acute infarct.  No intracranial hemorrhage.  Mild small vessel disease type changes.  Frontal sinus and ethmoid sinus air cell minimal to mild mucosal thickening.   Electronically Signed   By: Chauncey Cruel M.D.   On: 03/01/2014 12:40    Scheduled Meds: . antiseptic oral rinse  15 mL Mouth Rinse BID  . atorvastatin  10 mg Oral q1800  . cefTRIAXone (ROCEPHIN)  IV  1 g Intravenous Q24H  . diltiazem  30 mg Oral BID  . escitalopram  10 mg Oral Daily  . levalbuterol  0.63 mg Nebulization Q8H  . magic mouthwash  15 mL Oral TID  . metoprolol  50 mg Oral BID  .  mometasone-formoterol  2 puff Inhalation BID  . potassium chloride  20 mEq Oral BID  . tiotropium  18 mcg Inhalation Daily   Continuous Infusions: . 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 Left renal mass and pulmonary nodules, secondary to metastatic renal cell carcinoma until proven otherwise. For evaluation of the patient's hematuria, a CT scan of the abdomen and pelvis was ordered on admission. It revealed a 6.4 x 5.4 x 5.9 centrally necrotic mass in the left kidney, suspicious for renal cell carcinoma. CT of the chest was ordered when the CT of the abdomen revealed a 1.6 nodule at the right lung base. It revealed at least 3 pulmonary nodules in the right lower lobe and left upper lobe consistent with metastatic disease. Interventional radiologist at [facility] did not  believe a biopsy was necessary because the imaging was consistent with renal cell carcinoma. Oncology was consulted and the case was discussed with PA Mr. Sheldon Silvan and Dr. Bubba Hales. Additional imaging studies ordered including MRI which did not reveal metastatic disease. Bone scan is pending. Urology consult pending. Given her severe COPD and coronary artery disease, she would likely need definitive treatment at [facility] or [facility] where she would be managed by her primary cardiologist, Dr. Angelena Form and primary pulmonologist, Dr. Chase Caller  Acute pyelonephritis. Her urine culture is pending. Her white blood cell count has normalized. We'll continue Rocephin.  Gross hematuria. Secondary to renal cell carcinoma and acute pyelonephritis in the setting of Plavix. Plavix is on hold. Her urine has now cleared.  Acute blood loss  anemia. Secondary to gross hematuria and hemodilution. We'll continue to monitor. Her hemoglobin has been stable for the last 24 hours. No indication for transfusion currently.  Hyponatremia, secondary to hypovolemia. She was given gentle IV fluids for 24 hours. This will be restarted in light of recent contrast/dye. Her serum sodium was 120 on admission and has improved to 133.  Chronic atrial fibrillation, on Plavix chronically. Plavix is on hold secondary to gross hematuria. Her rate is treated chronically with metoprolol. She does have mild RVR which may be exacerbated by bronchodilators. We'll continue metoprolol and add when necessary diltiazem.  Coronary artery disease with ischemic cardiomyopathy. Currently stable. No signs of decompensated heart failure. We'll continue chronic cardiac medications with exception of holding antiplatelet therapy. 2-D echocardiogram ordered and revealed an ejection fraction of 55-60% and probable hypokinesis of the basal-mid inferio- lateral and inferior myocardium.  Hypertension. Her blood pressures have been low-normal. We will continue metoprolol. We'll continue holding Maxzide and losartan until her blood pressure has consistently improved.  Severe COPD. We will continue her bronchodilators. Albuterol nebulizer changed to Xopenex to decrease RVR.   Time spent: 35 minutes    [facility] Pager [phone] . If 7PM-7AM, please contact night-coverage at [email or web address], password [email or web address] 01/30/2014, 2:08 PM  LOS: 3 days

## 2014-01-30 NOTE — Consult Note (Signed)
[facility] Consultation Oncology  Name: Allison [name]      MRN: [id number]    Location: A320/A320-01  Date: 01/30/2014 Time:8:24 AM   REFERRING PHYSICIAN:  Rexene Alberts, MD  REASON FOR CONSULT:  Metastatic renal cell cancer   DIAGNOSIS:  Large left renal mass with pulmonary metastases, suspicious for metastatic renal cell carcinoma  HISTORY OF PRESENT ILLNESS:   Mrs. Allison [name] is a 63 year old white woman with a past medical history significant for tobacco abuse of 80 pack years quitting in 2011 following an MI, EtOH abuse is the distant past, severe COPD, a-fib, CAD, HTN, decreased LVEF, PAD, and carotid artery stenosis who presented to the ED with gross hematuria and vomiting.  Chart is reviewed.  The patient reports that she completed a teleconference with a physician for abdominal pain.  She was diagnosed with a UTI clinically and started on Bactrim and encouraged to follow-up with her PCP.  She reported to the [facility] ED with abdominal pain, hematuria, and nausea and vomiting on 7/24.  Her work-up in the ED included a CT abd/pelvis that revealed the following:  1. 6.4 x 5.4 x 5.9 cm peripherally enhancing centrally necrotic mass  along the anterior aspect of the left kidney, suspicious for a renal  cell carcinoma. Surrounding soft tissue inflammation seen.  Surrounding angiogenesis suggested.  2. Left renal perinephric stranding and fluid noted, with mild  stranding about the left ureter. This may reflect underlying  pyelonephritis, or may be reactive secondary to the large mass.  3. 1.6 cm nodule noted at the right lung base. This raises concern  for metastatic disease.  4. Right renal cyst seen. Few small nonobstructing right renal  stones noted.  5. Mild bibasilar atelectasis noted.  6. Relatively diffuse calcification along the abdominal aorta and  its branches.  She was subsequently admitted to the hospital for further evaluation and management.    A staging CT of  chest was performed with contrast showing:  1. At least 3 pulmonary nodules, the largest in the deep right lower  lobe and the inferior left upper lobe, measured above. This is  consistent with metastatic disease.  2. No evidence of metastatic lymphadenopathy in the thorax.  3. COPD/emphysema. Diffuse interstitial pulmonary opacities likely  represent mild edema.  4. Mild cardiomegaly. Moderate to severe LAD and left circumflex  coronary atherosclerosis.  5. No evidence of osseous metastatic disease.  6. Please see the report of the CT abdomen and pelvis yesterday for  details of the abdominal findings.  I personally reviewed and went over laboratory results with the patient.  The results are noted within this dictation.  I reviewed the images with the patient in her hospital room.   I personally reviewed and went over laboratory results with the patient.  The results are noted within this dictation.  Today is the patient's 42nd year anniversary with her husband.  I maintained a generalized conversation regarding malignancy with an emphasis on renal cell carcinoma as clinically this appears to be the working diagnosis.    Urology has been consulted for consideration of cytoreductive nephrectomy as part of her treatment management and diagnosis.    All questions were answered and the patient denies any active complaints this AM.  PAST MEDICAL HISTORY:   Past Medical History  Diagnosis Date  . Hypertension   . COPD (chronic obstructive pulmonary disease)   . Tobacco abuse     hx of  . Coronary artery disease  PCI, DES circumflex 2/11. moderate disease LAD  . Hyperlipidemia   . PVD (peripheral vascular disease)     lower extremity runoff 05/08/10  . Ischemic cardiomyopathy     Echo 08/14/09 with LVEF 40-45%.   . A-fib     ALLERGIES: Allergies  Allergen Reactions  . Ace Inhibitors Cough  . Amoxicillin-Pot Clavulanate Hives  . Codeine Nausea And Vomiting       MEDICATIONS: I have reviewed the patient's current medications.     PAST SURGICAL HISTORY Past Surgical History  Procedure Laterality Date  . Tonsillectomy    . Coronary stent placement    . Femoral bypass Left May 2012  . Lower extremity angiogram  December 16, 2011    FAMILY HISTORY: Family History  Problem Relation Age of Onset  . Coronary artery disease Mother     deceased. coronary artery bypass graft  . Cancer Mother     BREAST  . Heart disease Mother     PVD  - Amputation-Leg  . Hyperlipidemia Mother   . Hypertension Mother   . Heart attack Mother   . Cirrhosis Father     deceased  . Cancer Father   . Other Brother     alive, unknown hx    SOCIAL HISTORY: She has been married 42 years (today) to her husband.  She has 3 biological children, all female, ages 51, 18, and 33.  All in good health.  She has 6 adopted children.  She admits to a 2 ppd smoking history x 40 years (80 pack years) quitting in 2011 following an MI.  She also admits to a distant history of heavy EtOH abuse.  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  Prior to that, she was a foster parent and denies any ex[posure to harsh chemicals.  PERFORMANCE STATUS: The patient's performance status is 1 - Symptomatic but completely ambulatory  PHYSICAL EXAM: Most Recent Vital Signs: Blood pressure 109/46, pulse 91, temperature 99.3 F (37.4 C), temperature source Oral, resp. rate 20, height 5' 7"  (1.702 m), weight 173 lb 1 oz (78.5 kg), SpO2 100.00%. General appearance: alert, cooperative, appears older than stated age, mild distress and [place] in place for O2 administration Head: Normocephalic, without obvious abnormality, atraumatic, facial hair noted. Eyes: negative findings: lids and lashes normal, conjunctivae and sclerae normal, corneas clear and pupils equal, round, reactive to light and accomodation Throat: lips, mucosa, and tongue normal; teeth and gums normal and upper  dentures in place Neck: no adenopathy and supple, symmetrical, trachea midline Back: symmetric, no curvature. ROM normal. No CVA tenderness. Lungs: diminished breath sounds bilaterally Heart: regular rate and rhythm Abdomen: normal findings: bowel sounds normal, no organomegaly and spleen non-palpable Extremities: extremities normal, atraumatic, no cyanosis or edema and no edema, redness or tenderness in the calves or thighs Skin: Skin color, texture, turgor normal. No rashes or lesions Lymph nodes: Cervical, supraclavicular, and axillary nodes normal. Neurologic: Grossly normal  LABORATORY DATA:  Results for orders placed during the hospital encounter of 01/27/14 (from the past 48 hour(s))  BASIC METABOLIC PANEL     Status: Abnormal   Collection Time    01/29/14  5:42 AM      Result Value Ref Range   Sodium 133 (*) 137 - 147 mEq/L   Comment: DELTA CHECK NOTED   Potassium 3.4 (*) 3.7 - 5.3 mEq/L   Chloride 96  96 - 112 mEq/L   CO2 25  19 - 32  mEq/L   Glucose, Bld 109 (*) 70 - 99 mg/dL   BUN 6  6 - 23 mg/dL   Creatinine, Ser 0.71  0.50 - 1.10 mg/dL   Calcium 8.8  8.4 - 10.5 mg/dL   GFR calc non Af Amer >90  >90 mL/min   GFR calc Af Amer >90  >90 mL/min   Comment: (NOTE)     The eGFR has been calculated using the CKD EPI equation.     This calculation has not been validated in all clinical situations.     eGFR's persistently <90 mL/min signify possible Chronic Kidney     Disease.   Anion gap 12  5 - 15  CBC     Status: Abnormal   Collection Time    01/29/14  5:42 AM      Result Value Ref Range   WBC 8.9  4.0 - 10.5 K/uL   RBC 3.08 (*) 3.87 - 5.11 MIL/uL   Hemoglobin 9.5 (*) 12.0 - 15.0 g/dL   HCT 28.4 (*) 36.0 - 46.0 %   MCV 92.2  78.0 - 100.0 fL   MCH 30.8  26.0 - 34.0 pg   MCHC 33.5  30.0 - 36.0 g/dL   RDW 16.0 (*) 11.5 - 15.5 %   Platelets 328  150 - 400 K/uL  BASIC METABOLIC PANEL     Status: Abnormal   Collection Time    01/30/14  7:08 AM      Result Value Ref  Range   Sodium 133 (*) 137 - 147 mEq/L   Potassium 3.9  3.7 - 5.3 mEq/L   Chloride 98  96 - 112 mEq/L   CO2 25  19 - 32 mEq/L   Glucose, Bld 107 (*) 70 - 99 mg/dL   BUN 9  6 - 23 mg/dL   Creatinine, Ser 0.60  0.50 - 1.10 mg/dL   Calcium 8.8  8.4 - 10.5 mg/dL   GFR calc non Af Amer >90  >90 mL/min   GFR calc Af Amer >90  >90 mL/min   Comment: (NOTE)     The eGFR has been calculated using the CKD EPI equation.     This calculation has not been validated in all clinical situations.     eGFR's persistently <90 mL/min signify possible Chronic Kidney     Disease.   Anion gap 10  5 - 15  CBC     Status: Abnormal   Collection Time    01/30/14  7:08 AM      Result Value Ref Range   WBC 9.1  4.0 - 10.5 K/uL   RBC 3.04 (*) 3.87 - 5.11 MIL/uL   Hemoglobin 9.5 (*) 12.0 - 15.0 g/dL   HCT 28.2 (*) 36.0 - 46.0 %   MCV 92.8  78.0 - 100.0 fL   MCH 31.3  26.0 - 34.0 pg   MCHC 33.7  30.0 - 36.0 g/dL   RDW 16.0 (*) 11.5 - 15.5 %   Platelets 295  150 - 400 K/uL      RADIOGRAPHY: Ct Chest W Contrast  01/28/2014   CLINICAL DATA:  New diagnosis of renal cell carcinoma involving the left kidney. Staging.  EXAM: CT CHEST WITH CONTRAST  TECHNIQUE: Multidetector CT imaging of the chest was performed during intravenous contrast administration.  CONTRAST:  71m OMNIPAQUE IOHEXOL 300 MG/ML IV.  COMPARISON:  None.  FINDINGS: Emphysematous changes throughout both lungs. Approximate 1.3 x 1.7 x 1.3 cm nodule deep in the right  lower lobe just above the right hemidiaphragm (series 3, image 51 and coronal image 39). Approximate 1.0 x 1.5 x 1.2 cm necrotic nodule in the inferior left upper lobe, tethered to the lateral pleural with scarring. Approximate 0.5 cm nodule in the left apex. 2-3 mm nodule peripherally in the right middle lobe. Scarring in the lingula and to a lesser degree the right middle lobe and both lower lobes. No confluent airspace consolidation. Interstitial opacities throughout both lungs, best seen on  the coronal reformatted images, with Kerley B-lines. No pleural effusions. Central airways patent with mild bronchial wall thickening.  Normal sized lymph nodes in the mediastinum, hila, and both axillae. No significant lymphadenopathy. Visualized thyroid gland normal.  Heart mildly enlarged. Moderate to severe LAD and left circumflex coronary atherosclerosis. No pericardial effusion. Severe atherosclerosis involving the thoracic and upper abdominal aorta. Soft plaque at the origin of the right common carotid artery without significant stenosis.  Bone window images demonstrate mild thoracic spondylosis but no evidence of osseous metastatic disease. Please see the report of the CT abdomen and pelvis yesterday for details of the abdominal findings.  IMPRESSION: 1. At least 3 pulmonary nodules, the largest in the deep right lower lobe and the inferior left upper lobe, measured above. This is consistent with metastatic disease. 2. No evidence of metastatic lymphadenopathy in the thorax. 3. COPD/emphysema. Diffuse interstitial pulmonary opacities likely represent mild edema. 4. Mild cardiomegaly. Moderate to severe LAD and left circumflex coronary atherosclerosis. 5. No evidence of osseous metastatic disease. 6. Please see the report of the CT abdomen and pelvis yesterday for details of the abdominal findings.   Electronically Signed   By: Evangeline Dakin M.D.   On: 01/28/2014 11:24       PATHOLOGY:  None to date  ASSESSMENT:  1. 6.4 cm left renal mass with at least three pulmonary lesions, suspicious for metastatic renal cell carcinoma. 2. Gross hematuria, improving 3. Nausea and vomiting, resolved 4. SEVERE COPD 5. H/O Tobacco abuse with 80 pack year smoking history, quitting in 2011 6. H/O EtOH abuse 7. Atrial fibrillation 8. CAD 9. HTN 10. Decreased LVEF.  2D echo from 7/25 reading is pending. 11. PAD 12. Carotid artery stenosis    PLAN:  1. I personally reviewed and went over laboratory results  with the patient.  The results are noted within this dictation. 2. I personally reviewed and went over radiographic studies with the patient.  The results are noted within this dictation.   3. Chart is reviewed 4. Urology consult pending for consideration of cytoreductive and diagnostic nephrectomy  5. Will complete staging with MRI of brain and bone scan. 6. Will order an outpatient PET scan to help guide treatment options with regards to curative versus palliative nephrectomy 7. Will follow patient from periphery until pathologic confirmation of diagnosis is attained, at which point treatment recommendations will be made.  All questions were answered. The patient knows to call the clinic with any problems, questions or concerns. We can certainly see the patient much sooner if necessary.  Patient and plan discussed with Dr. Nelida Meuse and he is in agreement with the aforementioned.  A bone scan can be omitted if a PET scan is obtained.   KEFALAS,THOMAS 01/30/2014

## 2014-01-30 NOTE — Progress Notes (Signed)
Called [facility] to notify them that the patient had a new IV in place so that they could come up and do the exam.  The secretary for Radiology voiced that due to them not having anymore patients that they were most likely gone for today.  She stated that she would try to notify them to see if they were gone.  I verbalized  Understanding.

## 2014-01-31 ENCOUNTER — Inpatient Hospital Stay (HOSPITAL_COMMUNITY): Payer: Medicaid Other

## 2014-01-31 ENCOUNTER — Encounter (HOSPITAL_COMMUNITY): Payer: Self-pay | Admitting: Internal Medicine

## 2014-01-31 DIAGNOSIS — C649 Malignant neoplasm of unspecified kidney, except renal pelvis: Secondary | ICD-10-CM

## 2014-01-31 LAB — BASIC METABOLIC PANEL
ANION GAP: 9 (ref 5–15)
BUN: 10 mg/dL (ref 6–23)
CO2: 25 mEq/L (ref 19–32)
Calcium: 8.9 mg/dL (ref 8.4–10.5)
Chloride: 100 mEq/L (ref 96–112)
Creatinine, Ser: 0.58 mg/dL (ref 0.50–1.10)
GFR calc Af Amer: >90 mL/min (ref >90)
GFR calc non Af Amer: >90 mL/min (ref >90)
Glucose, Bld: 109 mg/dL — ABNORMAL HIGH (ref 70–99)
POTASSIUM: 4.8 meq/L (ref 3.7–5.3)
Sodium: 134 mEq/L — ABNORMAL LOW (ref 137–147)

## 2014-01-31 LAB — CBC
HEMATOCRIT: 28.8 % — AB (ref 36.0–46.0)
HEMOGLOBIN: 9.3 g/dL — AB (ref 12.0–15.0)
MCH: 30.7 pg (ref 26.0–34.0)
MCHC: 32.3 g/dL (ref 30.0–36.0)
MCV: 95 fL (ref 78.0–100.0)
Platelets: 296 10*3/uL (ref 150–400)
RBC: 3.03 MIL/uL — AB (ref 3.87–5.11)
RDW: 16.2 % — ABNORMAL HIGH (ref 11.5–15.5)
WBC: 10.3 10*3/uL (ref 4.0–10.5)

## 2014-01-31 MED ORDER — FUROSEMIDE 10 MG/ML IJ SOLN: 10.0000 mg | Freq: Once | INTRAMUSCULAR | Status: DC

## 2014-01-31 MED ORDER — DILTIAZEM HCL 30 MG PO TABS: 30.0000 mg | ORAL_TABLET | Freq: Two times a day (BID) | ORAL | Status: DC | PRN

## 2014-01-31 MED ORDER — SODIUM CHLORIDE 0.9 % IJ SOLN
3.0000 mL | Freq: Two times a day (BID) | INTRAMUSCULAR | Status: DC
  Administered 2014-01-31: 3 mL via INTRAVENOUS

## 2014-01-31 MED ORDER — CLOPIDOGREL BISULFATE 75 MG PO TABS: 75.0000 mg | ORAL_TABLET | Freq: Every day | ORAL | Status: DC

## 2014-01-31 MED ORDER — POTASSIUM CHLORIDE ER 10 MEQ PO TBCR: 10.0000 meq | EXTENDED_RELEASE_TABLET | Freq: Every day | ORAL | Status: DC | PRN

## 2014-01-31 MED ORDER — FUROSEMIDE 20 MG PO TABS: ORAL_TABLET | ORAL | Status: DC

## 2014-01-31 MED ORDER — FUROSEMIDE 10 MG/ML IJ SOLN
10.0000 mg | INTRAMUSCULAR | Status: AC
  Administered 2014-01-31: 10 mg via INTRAVENOUS
  Filled 2014-01-31: qty 2

## 2014-01-31 MED ORDER — CEFUROXIME AXETIL 500 MG PO TABS: 500.0000 mg | ORAL_TABLET | Freq: Two times a day (BID) | ORAL | Status: DC

## 2014-01-31 MED ORDER — FUROSEMIDE 10 MG/ML IJ SOLN
20.0000 mg | Freq: Once | INTRAMUSCULAR | Status: AC
  Administered 2014-01-31: 20 mg via INTRAVENOUS
  Filled 2014-01-31: qty 2

## 2014-01-31 MED ORDER — ALPRAZOLAM 0.5 MG PO TABS: 0.5000 mg | ORAL_TABLET | Freq: Every day | ORAL | Status: DC | PRN

## 2014-01-31 MED ORDER — LEVALBUTEROL HCL 0.63 MG/3ML IN NEBU: 0.6300 mg | INHALATION_SOLUTION | Freq: Three times a day (TID) | RESPIRATORY_TRACT | Status: DC

## 2014-01-31 NOTE — Progress Notes (Signed)
Patients O2 sats on RA are 99-100%. Patients sats on RA while ambulating approx 40 feet maintained 93-96%  Patient felt and appeared SOB while ambulating although sats maintained.  Placed on 2L O2 upon return to room per patient request for comfort.  sats on 2L O2 100%.

## 2014-01-31 NOTE — Progress Notes (Signed)
Patient discharged home with husband.  IV removed - WNL.  O2 tank delivered.  Patient instructed on new medication and how to take - also instructed not to take maxide and losartan until cleared by cardio to.  Instructed to take K+ when taking lasix to prevent hypokalemia.  Follow up appointments in place.  Advised when to seek medical attn.  No questions at this time.  Verbalizes understanding.  No questions at this time.  Stable to DC, assisted off unit via [place] with staff.

## 2014-01-31 NOTE — Progress Notes (Addendum)
Pt up very short of breath. Audible wheezes. Accessory muscles visible. Respiratory called. O2 via [place] 2L. Pt upright in bed. Oxygen Sat 96% on 2L  O2 via [place].

## 2014-01-31 NOTE — Care Management Note (Signed)
    Page 1 of 1   01/31/2014     4:46:39 PM CARE MANAGEMENT NOTE 01/31/2014  Patient:  Allison [name], Allison [name]   Account Number:  [id number]  Date Initiated:  01/31/2014  Documentation initiated by:  Vladimir Creeks  Subjective/Objective Assessment:   Pt admitted with acute pylonephritis. She is from home with spouse.  She has a son who is available if needed. She is found to have  probable renal CA. She will probably have surgery in the near future per urology and oncology consults.     Action/Plan:   Pt did not qualify for O2, but looks and feels short of breath, so spoke with her son and they will pay for O2 out of pocket   Anticipated DC Date:  01/31/2014   Anticipated DC Plan:  [place]  CM consult      PAC Choice  DURABLE MEDICAL EQUIPMENT   Choice offered to / List presented to:  C-1 Patient   DME arranged  3-N-1  [employer]  NEBULIZER MACHINE      DME agency  [facility]        Status of service:  Completed, signed off Medicare Important Message given?   (If response is "NO", the following Medicare IM given date fields will be blank) Date Medicare IM given:   Medicare IM given by:   Date Additional Medicare IM given:   Additional Medicare IM given by:    Discharge Disposition:  HOME/SELF CARE  Per UR Regulation:  Reviewed for med. necessity/level of care/duration of stay  If discussed at [place] of Stay Meetings, dates discussed:    Comments:  01/31/14 [place] RN/CM

## 2014-01-31 NOTE — Discharge Summary (Signed)
Physician Discharge Summary  Allison [name] IRS:[id number] DOB: 03-Apr-1951 DOA: 01/27/2014  PCP: Michaell Cowing, MD  Primary cardiologist, Dr. Julianne Handler  Primary pulmonologist, Dr. Chase Caller  Admit date: 01/27/2014 Discharge date: 01/31/2014  Time spent:  Greater than 30 minutes  Recommendations for Outpatient Follow-up:  1.   Recommend followup of the patient's hemoglobin and hematocrit.  2.   Discharge Diagnoses:  1.  Left renal mass with pulmonary nodules bilaterally, secondary to metastatic renal cell carcinoma until proven otherwise. --- Mass measuring 6.4 x 5.4 x 5.9 cm. 2. Acute pyelonephritis. 3. Gross hematuria, secondary to #1 and #2. 4. Ischemic cardiomyopathy with improved ejection fraction of 55-60%. 5. Mild pulmonary edema, secondary to volume resuscitation. 6. Chronic atrial fibrillation, on Plavix. 7. Hypertension, with low normal blood pressures during hospitalization. 8. Hyponatremia, secondary to hypovolemia. 9. Acute blood loss anemia from hematuria. Hemoglobin 9.3 at the time of discharge.  Discharge Condition:  Stable.  Diet recommendation:  Heart healthy.  Filed Weights   01/27/14 2059 01/28/14 0430  Weight: 77.111 kg (170 lb) 78.5 kg (173 lb 1 oz)    History of present illness:   The patient is a 63 year old woman with a history of ischemic cardiomyopathy, CAD, atrial fibrillation on Plavix, and severe COPD, who presented to the emergency department on 01/27/14 with a chief complaint of abdominal pain, pain with urination, and gross hematuria. She was afebrile. Her lab data were significant for a sodium of 120, potassium of 3.3, WBC of 13.3, hemoglobin of 12.4, and a urinalysis that revealed moderate leukocytes, positive nitrite, and too numerous to count RBCs. CT of her abdomen and pelvis revealed a 6.4 x 5.4 x 5.9 cm peripherally enhancing central necrotic mass along the anterior aspect of the left kidney, suspicious for renal cell carcinoma with surrounding soft  tissue inflammation suggestive of angiogenesis; left renal perinephric stranding which may reflect underlying pyelonephritis, and a 1.6 cm nodule noted at the right lung base. She was admitted for further evaluation and management.  Hospital Course:    1. Left renal mass and pulmonary nodules, secondary to metastatic renal cell carcinoma until proven otherwise.  For evaluation of the patient's hematuria, a CT scan of the abdomen and pelvis was ordered on admission. It revealed a 6.4 x 5.4 x 5.9 centrally necrotic mass in the left kidney, suspicious for renal cell carcinoma. CT of the chest was ordered when the CT of the abdomen revealed a 1.6 nodule at the right lung base. It revealed at least 3 pulmonary nodules in the right lower lobe and left upper lobe consistent with metastatic disease. Interventional radiologist at [facility] did not believe a biopsy was necessary because the imaging was consistent with renal cell carcinoma. Oncology was consulted and the case was discussed with PA Mr. Sheldon Silvan and Dr. Bubba Hales. They recommended urology consultation for consideration of cytoreductive nephrectomy.They ordered additional imaging studies. The MRI of the brain did not reveal metastatic disease.The total body bone scan did not reveal metastatic lesions. A PET scan will be arranged by them. Urologist, Dr. Diona Fanti was consulted. He agreed that cytoreductive nephrectomy was a good plan and that she would need consultation with one of his minimally invasive surgeon colleagues in [place] for further discussion. He also recommended that she has an office cystoscopy to rule out lower urinary tract lesions or urothelial carcinoma. And if she has significant bleeding before her possible laparoscopic nephrectomy, she could be given selective renal arterial embolization prior to surgery. He advised that the patient have  pulmonary and cardiac clearance. Appointments were made for the patient to followup with her  cardiologist, Dr. Angelena Form and pulmonologist, Dr. Chase Caller. Oncology will follow her after the nephrectomy pathology results.  Acute pyelonephritis.   A urine culture was ordered and she was started on Rocephin and IV fluids.  The urine culture apparently was never done. The patient improved clinically and she was discharged on 5 more days of Ceftin. Gross hematuria.  Secondary to renal cell carcinoma and acute pyelonephritis in the setting of Plavix. Plavix was held during the hospitalization. Her gross hematuria resolved. Plavix was restarted at the time of discharge following my discussion with urology. She was instructed to stop Plavix if her gross hematuria returned.  Acute blood loss anemia.  Secondary to gross hematuria and hemodilution. Her hemoglobin drifted downward and stabilized at 9.3-9.5. Hyponatremia, secondary to hypovolemia.   The patient's serum sodium was quite low at 120 on admission. She was hydrated vigorously for the first 48 hours and then the IV fluids were tapered down. Maxzide was withheld. Her serum sodium improved. She developed mild peripheral edema and Lasix was given. Her serum sodium was 134 at the time of discharge.  Chronic atrial fibrillation with mild RVR, on Plavix chronically.  Plavix  was on hold secondary to gross hematuria. Her rate is treated chronically with metoprolol, which was continued. She did have mild RVR which  was exacerbated by bronchodilators. She was given when necessary diltiazem. Her heart rate improved at the time of discharge.  Plavix was restarted upon discharge with instructions to hold it if hematuria returned. Coronary artery disease with ischemic cardiomyopathy.   Initially, there were no signs of decompensated heart failure.  She was continued on her chronic cardiac medications with exception of holding antiplatelet therapy. With hydration, she developed some symptomatic pulmonary crackles and pulmonary edema was noted on the followup  chest x-ray. She was given IV Lasix once an oral Lasix once. She diuresed very well.  2-D echocardiogram was ordered and revealed an ejection fraction of 55-60% and probable hypokinesis of the basal-mid inferio- lateral and inferior myocardium. Her ejection fraction had previously been 40-45%. She was discharged on oral Lasix for 3 more days and then when necessary thereafter. She will followup with her cardiologist as scheduled. Hypertension.  Her blood pressures were low-normal. Metoprolol was continued, but Maxzide and losartan were withheld. She was instructed to hold Maxzide and losartan until she follows up with her cardiologist.  Severe COPD.  There was no clear evidence of decompensated COPD. She was continued on bronchodilators.  She was given a prescription for a nebulizer machine and Xopenex nebulizer. She was also given a prescription for home oxygen, although, the patient's oxygen saturations range from 93-97% on room air. An followup appointment was made with her primary pulmonologist.    Procedures: 2-D echocardiogram: 7/25: Study Conclusions - Left ventricle: The cavity size was normal. Wall thickness was normal. Systolic function was normal. The estimated ejection fraction was in the range of 55% to 60%. Probable hypokinesis of the basal-midinferolateral and inferior myocardium. The study is not technically sufficient to allow evaluation of LV diastolic function. - Aortic valve: Mildly calcified annulus. Trileaflet; mildly calcified leaflets. There was no significant regurgitation. - Mitral valve: Calcified annulus. There was trivial regurgitation. - Left atrium: The atrium was moderately dilated. - Right atrium: The atrium was moderately dilated. Central venous pressure (est): 8 mm Hg. - Tricuspid valve: There was trivial regurgitation. - Pulmonary arteries: Systolic pressure could not be  accurately estimated. - Pericardium, extracardiac: There was no pericardial  effusion. Impressions: - Normal LV chamber size and wall thickness with LVEF 55-60%, probable hypokinesis of the mid to basal inferolateral wall. Indeterminate diastolic function in the setting of atrial fibrillation. Moderate biatrial enlargement. MAC with trivial mitral regurgitation. Unable to assess PASP.   Consultations:   Urology, Dr. Diona Fanti  Oncology, Dr. Bubba Hales    Discharge Exam: Filed Vitals:   01/31/14 1421  BP: 127/72  Pulse: 104  Temp: 98.1 F (36.7 C)  Resp: 20    General: Alert 63 year old Caucasian woman, sitting up in bed, in no acute distress.  Cardiovascular: Irregular, irregular, with borderline tachycardia.  Respiratory: Few bibasilar crackles; breathing nonlabored after Lasix.  Abdomen: Positive bowel sounds, soft, mild left flank tenderness. No masses palpated. No distention.  Musculoskeletal/extremities: No pedal edema. No acute hot red joints.  Neurologic/psychiatric: The patient is alert and oriented x3. Cranial nerves II through XII are intact. She is not tearful today.    Discharge Instructions You were cared for by a hospitalist during your hospital stay. If you have any questions about your discharge medications or the care you received while you were in the hospital after you are discharged, you can call the unit and asked to speak with the hospitalist on call if the hospitalist that took care of you is not available. Once you are discharged, your primary care physician will handle any further medical issues. Please note that NO REFILLS for any discharge medications will be authorized once you are discharged, as it is imperative that you return to your primary care physician (or establish a relationship with a primary care physician if you do not have one) for your aftercare needs so that they can reassess your need for medications and monitor your lab values.  Discharge Instructions   Diet - low sodium heart healthy    Complete by:  As directed       Discharge instructions    Complete by:  As directed   Do not take Maxzide and Losartan until you are reevaluated by your cardiologist or until you systolic blood pressure is consistently above 140.     Increase activity slowly    Complete by:  As directed             Medication List    STOP taking these medications       ADVIL PM 200-25 MG Caps  Generic drug:  Ibuprofen-Diphenhydramine HCl     losartan 100 MG tablet  Commonly known as:  COZAAR     sulfamethoxazole-trimethoprim 800-160 MG per tablet  Commonly known as:  BACTRIM DS     triamterene-hydrochlorothiazide 37.5-25 MG per tablet  Commonly known as:  MAXZIDE-25      TAKE these medications       albuterol 108 (90 BASE) MCG/ACT inhaler  Commonly known as:  PROVENTIL HFA;VENTOLIN HFA  Inhale 2 puffs into the lungs every 6 (six) hours as needed for wheezing.     ALPRAZolam 0.5 MG tablet  Commonly known as:  XANAX  Take 1 tablet (0.5 mg total) by mouth daily as needed for anxiety or sleep.     cefUROXime 500 MG tablet  Commonly known as:  CEFTIN  Take 1 tablet (500 mg total) by mouth 2 (two) times daily with a meal. Antibiotic to take for 6 more days.     clopidogrel 75 MG tablet  Commonly known as:  PLAVIX  Take 1 tablet (75 mg total) by mouth  daily. Stop this medicine if you see blood in your urine.     Cranberry 450 MG Caps  Take 1 capsule by mouth daily.     escitalopram 10 MG tablet  Commonly known as:  LEXAPRO  Take 1 tablet (10 mg total) by mouth daily.     Fluticasone Furoate-Vilanterol 100-25 MCG/INH Aepb  Commonly known as:  BREO ELLIPTA  Inhale 1 puff into the lungs daily. Take 1 puff daily     furosemide 20 MG tablet  Commonly known as:  LASIX  Staring tomorrow take lasix ("fluid pill") day for 3 days then take as needed for swelling.     levalbuterol 0.63 MG/3ML nebulizer solution  Commonly known as:  XOPENEX  Take 3 mLs (0.63 mg total) by nebulization every 8 (eight) hours.      metoprolol 50 MG tablet  Commonly known as:  LOPRESSOR  Take 1 tablet (50 mg total) by mouth 2 (two) times daily.     potassium chloride 10 MEQ tablet  Commonly known as:  K-DUR  Take 1 tablet (10 mEq total) by mouth daily as needed (Take with Lasix).     simvastatin 20 MG tablet  Commonly known as:  ZOCOR  Take 1 tablet (20 mg total) by mouth every evening.     tiotropium 18 MCG inhalation capsule  Commonly known as:  SPIRIVA  Place 1 capsule (18 mcg total) into inhaler and inhale daily.       Allergies  Allergen Reactions  . Ace Inhibitors Cough  . Amoxicillin-Pot Clavulanate Hives  . Codeine Nausea And Vomiting       Follow-up Information   Follow up with Melina Copa, PA-C. (Appointment Aug. 11th at 11:30am with [facility])    Specialty:  Cardiology   Contact information:   [address] [place] [address] [phone]       Follow up with [facility], MD. (Appointment with Dr. Chase Caller Aug. 17th at 9:15am)    Specialty:  Pulmonary Disease   Contact information:   [place] [address] [phone]       Follow up with DAHLSTEDT, Lillette Boxer, MD. (His office will call you with the follow up appointment)    Specialty:  Urology   Contact information:   [place] [address] [phone]       Follow up with KEFALAS,THOMAS, PA-C. (Follow up as needed following the kidney surgery to discuss treatment)    Specialty:  Physician Assistant   Contact information:   [place] [address] [phone]        The results of significant diagnostics from this hospitalization (including imaging, microbiology, ancillary and laboratory) are listed below for reference.    Significant Diagnostic Studies: Ct Chest W Contrast  01/28/2014   CLINICAL DATA:  New diagnosis of renal cell carcinoma involving the left kidney. Staging.  EXAM: CT CHEST WITH CONTRAST  TECHNIQUE: Multidetector CT imaging of  the chest was performed during intravenous contrast administration.  CONTRAST:  78mL OMNIPAQUE IOHEXOL 300 MG/ML IV.  COMPARISON:  None.  FINDINGS: Emphysematous changes throughout both lungs. Approximate 1.3 x 1.7 x 1.3 cm nodule deep in the right lower lobe just above the right hemidiaphragm (series 3, image 51 and coronal image 39). Approximate 1.0 x 1.5 x 1.2 cm necrotic nodule in the inferior left upper lobe, tethered to the lateral pleural with scarring. Approximate 0.5 cm nodule in the left apex. 2-3 mm nodule  peripherally in the right middle lobe. Scarring in the lingula and to a lesser degree the right middle lobe and both lower lobes. No confluent airspace consolidation. Interstitial opacities throughout both lungs, best seen on the coronal reformatted images, with Kerley B-lines. No pleural effusions. Central airways patent with mild bronchial wall thickening.  Normal sized lymph nodes in the mediastinum, hila, and both axillae. No significant lymphadenopathy. Visualized thyroid gland normal.  Heart mildly enlarged. Moderate to severe LAD and left circumflex coronary atherosclerosis. No pericardial effusion. Severe atherosclerosis involving the thoracic and upper abdominal aorta. Soft plaque at the origin of the right common carotid artery without significant stenosis.  Bone window images demonstrate mild thoracic spondylosis but no evidence of osseous metastatic disease. Please see the report of the CT abdomen and pelvis yesterday for details of the abdominal findings.  IMPRESSION: 1. At least 3 pulmonary nodules, the largest in the deep right lower lobe and the inferior left upper lobe, measured above. This is consistent with metastatic disease. 2. No evidence of metastatic lymphadenopathy in the thorax. 3. COPD/emphysema. Diffuse interstitial pulmonary opacities likely represent mild edema. 4. Mild cardiomegaly. Moderate to severe LAD and left circumflex coronary atherosclerosis. 5. No evidence of  osseous metastatic disease. 6. Please see the report of the CT abdomen and pelvis yesterday for details of the abdominal findings.   Electronically Signed   By: Evangeline Dakin M.D.   On: 01/28/2014 11:24   Mr Jeri Cos WE Contrast  01/30/2014   CLINICAL DATA:  Kidney cancer. Difficulty ambulating. Hypertension. Hyperlipidemia.  EXAM: MRI HEAD WITHOUT AND WITH CONTRAST  TECHNIQUE: Multiplanar, multiecho pulse sequences of the brain and surrounding structures were obtained without and with intravenous contrast.  CONTRAST:  73mL MULTIHANCE GADOBENATE DIMEGLUMINE 529 MG/ML IV SOLN  COMPARISON:  None.  FINDINGS: Exam is motion degraded.  No parenchymal enhancing lesion noted on this motion degraded examination.  Heterogeneous appearance of bone marrow without definitive osseous lesion.  No acute infarct.  No intracranial hemorrhage.  Mild small vessel disease type changes.  Mild atrophy without hydrocephalus.  Partially empty sella incidentally noted.  Cervical medullary junction and pineal region unremarkable. Orbital structures within normal limits.  Frontal sinus and ethmoid sinus air cell minimal to mild mucosal thickening.  Major intracranial vascular structures are patent.  IMPRESSION: Exam is motion degraded.  No parenchymal enhancing lesion noted on this motion degraded examination.  Heterogeneous appearance of bone marrow without definitive osseous lesion.  No acute infarct.  No intracranial hemorrhage.  Mild small vessel disease type changes.  Frontal sinus and ethmoid sinus air cell minimal to mild mucosal thickening.   Electronically Signed   By: Chauncey Cruel M.D.   On: 01/30/2014 12:40   Nm Bone Scan Whole Body  01/30/2014   CLINICAL DATA:  Renal cell carcinoma.  EXAM: NUCLEAR MEDICINE WHOLE BODY BONE SCAN  TECHNIQUE: Whole body anterior and posterior images were obtained approximately 3 hours after intravenous injection of radiopharmaceutical.  RADIOPHARMACEUTICALS:  25 mCi Technetium-99 MDP   COMPARISON:  CT scan 01/27/2014  FINDINGS: No areas of abnormal uptake are identified in the axial or appendicular skeleton to suggest osseous metastasis. There are areas of mild degenerative type uptake in the shoulders and knees.  IMPRESSION: Negative whole-body bone scan for osseous metastatic disease.   Electronically Signed   By: Kalman Jewels M.D.   On: 01/30/2014 17:03   Ct Abdomen Pelvis W Contrast  01/28/2014   CLINICAL DATA:  Lower abdominal pain.  Hematuria.  EXAM: CT ABDOMEN AND PELVIS WITH CONTRAST  TECHNIQUE: Multidetector CT imaging of the abdomen and pelvis was performed using the standard protocol following bolus administration of intravenous contrast.  CONTRAST:  166mL OMNIPAQUE IOHEXOL 300 MG/ML  SOLN  COMPARISON:  None.  FINDINGS: There is a 1.6 cm nodule at the right lung base (image 11 of 22). This may reflect metastatic disease. Mild bibasilar atelectasis is noted.  The liver and spleen are unremarkable in appearance. The gallbladder is within normal limits. The pancreas and adrenal glands are unremarkable.  There is a large peripherally enhancing centrally necrotic mass arising at the anterior aspect of the left kidney. It measures approximately 6.4 x 5.4 x 5.9 cm, and is unusually lobulated in appearance. This is compatible with a renal cell carcinoma. Surrounding soft tissue inflammation is seen. Surrounding angiogenesis is suggested.  Left-sided perinephric stranding and fluid are seen, and mild stranding is seen tracking along the course of the left ureter. This may reflect underlying pyelonephritis, or may be reactive secondary to the large mass.  A 2.1 cm cyst is noted near the upper pole of the right kidney. A few small nonobstructing stones are seen at the upper pole of the right kidney.  No free fluid is identified. The small bowel is unremarkable in appearance. The stomach is within normal limits. No acute vascular abnormalities are seen. Relatively diffuse calcification is  seen along the abdominal aorta and its branches.  The appendix is normal in caliber and contains air, without evidence for appendicitis. The colon is largely decompressed and grossly unremarkable in appearance.  The bladder is largely decompressed and grossly unremarkable. The uterus is within normal limits. The ovaries are relatively symmetric. No suspicious adnexal masses are seen. No inguinal lymphadenopathy is seen.  No acute osseous abnormalities are identified. A few tiny bone islands are noted within the visualized osseous structures.  IMPRESSION: 1. 6.4 x 5.4 x 5.9 cm peripherally enhancing centrally necrotic mass along the anterior aspect of the left kidney, suspicious for a renal cell carcinoma. Surrounding soft tissue inflammation seen. Surrounding angiogenesis suggested. 2. Left renal perinephric stranding and fluid noted, with mild stranding about the left ureter. This may reflect underlying pyelonephritis, or may be reactive secondary to the large mass. 3. 1.6 cm nodule noted at the right lung base. This raises concern for metastatic disease. 4. Right renal cyst seen. Few small nonobstructing right renal stones noted. 5. Mild bibasilar atelectasis noted. 6. Relatively diffuse calcification along the abdominal aorta and its branches.   Electronically Signed   By: Garald Balding M.D.   On: 01/28/2014 00:26   Dg Chest Port 1 View  01/31/2014   CLINICAL DATA:  Shortness of breath.  Renal cell carcinoma.  EXAM: PORTABLE CHEST - 1 VIEW  COMPARISON:  01/28/2014 chest CT. 01/27/2014 and 11/12/2010 chest x-ray.  FINDINGS: Progressive pulmonary vascular congestion/ pulmonary edema superimposed upon chronic changes.  CT detected pulmonary nodules better delineated on recent chest CT. Please see report from such.  Cardiomegaly.  Calcified aorta.  No gross pneumothorax.  IMPRESSION: Progressive pulmonary vascular congestion/ pulmonary edema superimposed upon chronic changes.  CT detected pulmonary nodules better  delineated on recent chest CT. Please see report from such.  Cardiomegaly.   Electronically Signed   By: Chauncey Cruel M.D.   On: 01/31/2014 10:27   Dg Chest Portable 1 View  01/27/2014   CLINICAL DATA:  ABDOMINAL PAIN VAGINAL BLEEDING  EXAM: PORTABLE CHEST - 1 VIEW  COMPARISON:  11/12/2010  FINDINGS:  Heart size upper limits normal. Prominent linear left perihilar and right basilar opacities, increased since previous. No confluent airspace disease. No effusion. Regional bones unremarkable.  IMPRESSION: 1. Some increase in asymmetric linear parenchymal opacities as above.   Electronically Signed   By: Arne Cleveland M.D.   On: 01/27/2014 21:38    Microbiology: No results found for this or any previous visit (from the past 240 hour(s)).   Labs: Basic Metabolic Panel:  Recent Labs Lab 01/27/14 2109 01/28/14 0541 01/29/14 0542 01/30/14 0708 01/31/14 0625  NA 120* 124* 133* 133* 134*  K 3.3* 3.3* 3.4* 3.9 4.8  CL 84* 90* 96 98 100  CO2 19 22 25 25 25   GLUCOSE 121* 125* 109* 107* 109*  BUN 10 7 6 9 10   CREATININE 0.82 0.76 0.71 0.60 0.58  CALCIUM 9.0 8.1* 8.8 8.8 8.9   Liver Function Tests:  Recent Labs Lab 01/27/14 2109  AST 18  ALT 12  ALKPHOS 106  BILITOT 0.4  PROT 7.1  ALBUMIN 3.5   No results found for this basename: LIPASE, AMYLASE,  in the last 168 hours No results found for this basename: AMMONIA,  in the last 168 hours CBC:  Recent Labs Lab 01/27/14 2109 01/28/14 0541 01/29/14 0542 01/30/14 0708 01/31/14 0625  WBC 13.3* 10.7* 8.9 9.1 10.3  NEUTROABS 11.2*  --   --   --   --   HGB 12.4 10.1* 9.5* 9.5* 9.3*  HCT 34.8* 29.2* 28.4* 28.2* 28.8*  MCV 87.9 88.8 92.2 92.8 95.0  PLT 360 355 328 295 296   Cardiac Enzymes: No results found for this basename: CKTOTAL, CKMB, CKMBINDEX, TROPONINI,  in the last 168 hours BNP: BNP (last 3 results) No results found for this basename: PROBNP,  in the last 8760 hours CBG: No results found for this basename: GLUCAP,  in  the last 168 hours     Signed:  Zema Lizardo  [facility] 01/31/2014, 5:02 PM

## 2014-01-31 NOTE — Consult Note (Signed)
Urology Consult   Physician requesting consult: Rexene Alberts, M.D.  Reason for consult: metastatic renal cell carcinoma  History of Present Illness: Allison [name] is a 63 y.o. female was admitted recently to [facility] with gross hematuria and abdominal pain. She has been having intermittent gross hematuria with lower urinary tract discomfort for the past 7 months or so.  Recently, she has had an episode of gross hematuria with epigastric pain.  She presented to the hospital emergency room.  CT scan revealed a 6 cm left upper pole renal mass consistent with renal cell carcinoma. She has also had a bone scan performed which was normal, as well as a chest CT which revealed several pulmonary nodules consistent with metastatic disease.  She has had no hemoptysis.  She has had no chest pain. Her hematuria is clearing.  She is currently not having any epigastric pain.  She has been consulted on by Dr. Bubba Hales from oncology. It was recommended that she undergo cytoreductive nephrectomy with eventual targeted therapy.  She does have COPD as well as coronary artery disease.  She has had some fluid retention recently while in the hospital.recent 2-D echo revealed ejection fraction of 55-60%.  There was hypokinesis in the inferior myocardium in the mid inferolateral myocardium.she has atrial fibrillation.    Past Medical History  Diagnosis Date  . Hypertension   . COPD (chronic obstructive pulmonary disease)   . Tobacco abuse     hx of  . Coronary artery disease     PCI, DES circumflex 2/11. moderate disease LAD  . Hyperlipidemia   . PVD (peripheral vascular disease)     lower extremity runoff 05/08/10  . Ischemic cardiomyopathy     Echo 08/14/09 with LVEF 40-45%.   Edrick Kins     Past Surgical History  Procedure Laterality Date  . Tonsillectomy    . Coronary stent placement    . Femoral bypass Left May 2012  . Lower extremity angiogram  December 16, 2011     Current Hospital  Medications: Scheduled Meds: . antiseptic oral rinse  15 mL Mouth Rinse BID  . atorvastatin  10 mg Oral q1800  . cefTRIAXone (ROCEPHIN)  IV  1 g Intravenous Q24H  . escitalopram  10 mg Oral Daily  . levalbuterol  0.63 mg Nebulization Q8H  . metoprolol  50 mg Oral BID  . mometasone-formoterol  2 puff Inhalation BID  . potassium chloride  20 mEq Oral BID  . sodium chloride  3 mL Intravenous Q12H  . tiotropium  18 mcg Inhalation Daily   Continuous Infusions:  PRN Meds:.ALPRAZolam, diltiazem, levalbuterol, morphine injection, ondansetron (ZOFRAN) IV, ondansetron  Allergies:  Allergies  Allergen Reactions  . Ace Inhibitors Cough  . Amoxicillin-Pot Clavulanate Hives  . Codeine Nausea And Vomiting    Family History  Problem Relation Age of Onset  . Coronary artery disease Mother     deceased. coronary artery bypass graft  . Cancer Mother     BREAST  . Heart disease Mother     PVD  - Amputation-Leg  . Hyperlipidemia Mother   . Hypertension Mother   . Heart attack Mother   . Cirrhosis Father     deceased  . Cancer Father   . Other Brother     alive, unknown hx    Social History:  reports that she quit smoking about 4 years ago. Her smoking use included Cigarettes. She has a 45 pack-year smoking history. She has never used smokeless tobacco.  She reports that she does not drink alcohol or use illicit drugs.  ROS: Nausea, vomiting, gross hematuria, epigastric pain.  Physical Exam:  Vital signs in last 24 hours: Temp:  [97.5 F (36.4 C)-99 F (37.2 C)] 98.1 F (36.7 C) (07/28 1421) Pulse Rate:  [69-104] 104 (07/28 1421) Resp:  [20-22] 20 (07/28 1421) BP: (103-127)/(47-72) 127/72 mmHg (07/28 1421) SpO2:  [96 %-100 %] 99 % (07/28 1451) General:  Alert and oriented, No acute distress HEENT: Normocephalic, atraumatic Neck: No JVD or lymphadenopathy Cardiovascular: Regular rate  Lungs: decreased inspiratory effort.  Mild use of accessory muscles. Abdomen: Soft,mildly  obese.  Minimal left CVA tenderness.  No abdominal masses.  No guarding. Extremities: No edema Neurologic: Grossly intact  Laboratory Data:   Recent Labs  01/29/14 0542 01/30/14 0708 01/31/14 0625  WBC 8.9 9.1 10.3  HGB 9.5* 9.5* 9.3*  HCT 28.4* 28.2* 28.8*  PLT 328 295 296     Recent Labs  01/29/14 0542 01/30/14 0708 01/31/14 0625  NA 133* 133* 134*  K 3.4* 3.9 4.8  CL 96 98 100  GLUCOSE 109* 107* 109*  BUN 6 9 10   CALCIUM 8.8 8.8 8.9  CREATININE 0.71 0.60 0.58     Results for orders placed during the hospital encounter of 01/27/14 (from the past 24 hour(s))  BASIC METABOLIC PANEL     Status: Abnormal   Collection Time    01/31/14  6:25 AM      Result Value Ref Range   Sodium 134 (*) 137 - 147 mEq/L   Potassium 4.8  3.7 - 5.3 mEq/L   Chloride 100  96 - 112 mEq/L   CO2 25  19 - 32 mEq/L   Glucose, Bld 109 (*) 70 - 99 mg/dL   BUN 10  6 - 23 mg/dL   Creatinine, Ser 0.58  0.50 - 1.10 mg/dL   Calcium 8.9  8.4 - 10.5 mg/dL   GFR calc non Af Amer >90  >90 mL/min   GFR calc Af Amer >90  >90 mL/min   Anion gap 9  5 - 15  CBC     Status: Abnormal   Collection Time    01/31/14  6:25 AM      Result Value Ref Range   WBC 10.3  4.0 - 10.5 K/uL   RBC 3.03 (*) 3.87 - 5.11 MIL/uL   Hemoglobin 9.3 (*) 12.0 - 15.0 g/dL   HCT 28.8 (*) 36.0 - 46.0 %   MCV 95.0  78.0 - 100.0 fL   MCH 30.7  26.0 - 34.0 pg   MCHC 32.3  30.0 - 36.0 g/dL   RDW 16.2 (*) 11.5 - 15.5 %   Platelets 296  150 - 400 K/uL   No results found for this or any previous visit (from the past 240 hour(s)).  Renal Function:  Recent Labs  01/27/14 2109 01/28/14 0541 01/29/14 0542 01/30/14 0708 01/31/14 0625  CREATININE 0.82 0.76 0.71 0.60 0.58   Estimated Creatinine Clearance: 79.7 ml/min (by C-G formula based on Cr of 0.58).  Radiologic Imaging: Mr Jeri Cos BZ Contrast  01/30/2014   CLINICAL DATA:  Kidney cancer. Difficulty ambulating. Hypertension. Hyperlipidemia.  EXAM: MRI HEAD WITHOUT AND  WITH CONTRAST  TECHNIQUE: Multiplanar, multiecho pulse sequences of the brain and surrounding structures were obtained without and with intravenous contrast.  CONTRAST:  79mL MULTIHANCE GADOBENATE DIMEGLUMINE 529 MG/ML IV SOLN  COMPARISON:  None.  FINDINGS: Exam is motion degraded.  No parenchymal enhancing lesion noted  on this motion degraded examination.  Heterogeneous appearance of bone marrow without definitive osseous lesion.  No acute infarct.  No intracranial hemorrhage.  Mild small vessel disease type changes.  Mild atrophy without hydrocephalus.  Partially empty sella incidentally noted.  Cervical medullary junction and pineal region unremarkable. Orbital structures within normal limits.  Frontal sinus and ethmoid sinus air cell minimal to mild mucosal thickening.  Major intracranial vascular structures are patent.  IMPRESSION: Exam is motion degraded.  No parenchymal enhancing lesion noted on this motion degraded examination.  Heterogeneous appearance of bone marrow without definitive osseous lesion.  No acute infarct.  No intracranial hemorrhage.  Mild small vessel disease type changes.  Frontal sinus and ethmoid sinus air cell minimal to mild mucosal thickening.   Electronically Signed   By: Chauncey Cruel M.D.   On: 01/30/2014 12:40   Nm Bone Scan Whole Body  01/30/2014   CLINICAL DATA:  Renal cell carcinoma.  EXAM: NUCLEAR MEDICINE WHOLE BODY BONE SCAN  TECHNIQUE: Whole body anterior and posterior images were obtained approximately 3 hours after intravenous injection of radiopharmaceutical.  RADIOPHARMACEUTICALS:  25 mCi Technetium-99 MDP  COMPARISON:  CT scan 01/27/2014  FINDINGS: No areas of abnormal uptake are identified in the axial or appendicular skeleton to suggest osseous metastasis. There are areas of mild degenerative type uptake in the shoulders and knees.  IMPRESSION: Negative whole-body bone scan for osseous metastatic disease.   Electronically Signed   By: Kalman Jewels M.D.   On:  01/30/2014 17:03   Dg Chest Port 1 View  01/31/2014   CLINICAL DATA:  Shortness of breath.  Renal cell carcinoma.  EXAM: PORTABLE CHEST - 1 VIEW  COMPARISON:  01/28/2014 chest CT. 01/27/2014 and 11/12/2010 chest x-ray.  FINDINGS: Progressive pulmonary vascular congestion/ pulmonary edema superimposed upon chronic changes.  CT detected pulmonary nodules better delineated on recent chest CT. Please see report from such.  Cardiomegaly.  Calcified aorta.  No gross pneumothorax.  IMPRESSION: Progressive pulmonary vascular congestion/ pulmonary edema superimposed upon chronic changes.  CT detected pulmonary nodules better delineated on recent chest CT. Please see report from such.  Cardiomegaly.   Electronically Signed   By: Chauncey Cruel M.D.   On: 01/31/2014 10:27    I independently reviewed the above imaging studies.I reviewed the images with the patient and her husband.  Impression/Assessment:  1.Left renal mass with gross hematuria.  More than likely, this is a renal cell carcinoma, although with long-standing hematuria, this could be a urothelial carcinoma. She does have risk factors for both of these.  There is no evidence of renal vein thrombus.  There is evidence of metastatic disease--pulmonary metastases. Bone scan was negative.  2.  Multiple comorbidities-atrial fibrillation, history of peripheral vascular disease, COPD, coronary artery disease  Plan:  1.  I agree with Dr. Bubba Hales with cytoreductive nephrectomy is a good plan, although the patient's multiple medical issues, she needs pulmonary and cardiac clearance.  Currently, she is asymptomatic.  She will need a consultation with one of our minimally invasive surgeons in [place] to discuss surgery.  We will get that appointment set up.  2.  Prior to her procedure, office cystoscopy is necessary to rule out lower urinary tract lesions or urothelial carcinomas.  3.  If the patient has significant bleeding before her possible laparoscopic  nephrectomy, but could be given to selective renal arterial embolization prior to the surgery.

## 2014-02-01 NOTE — Treatment Plan (Signed)
Was called by the patient's pharmacy, since patient's insurance does not cover Xopenex. This medication was changed to albuterol.  Allison [name]

## 2014-02-01 NOTE — Progress Notes (Signed)
UR chart review completed.  

## 2014-02-06 NOTE — Telephone Encounter (Signed)
Spoke with breo. She reports she wakes up about 3 am w/ wheezing and SOB. She reports she does not feel the breo helps. She is scheduled to come in and see MW tomorrow for visit. Nothing further needed

## 2014-02-06 NOTE — Telephone Encounter (Signed)
Pt states she is having difficulty using the breo.  Pt wakes up on the AM having difficulty breathing.  Would like to know if there is an alternative that might be taken in the AM & the PM (right before bed) that will help.  Pt can be reached at [phone].  Pt states she never heard anything back on the PA, either.   Satira Anis

## 2014-02-07 ENCOUNTER — Telehealth: Payer: Self-pay | Admitting: Internal Medicine

## 2014-02-07 ENCOUNTER — Telehealth: Payer: Self-pay | Admitting: Cardiovascular Disease

## 2014-02-07 ENCOUNTER — Ambulatory Visit (INDEPENDENT_AMBULATORY_CARE_PROVIDER_SITE_OTHER): Payer: Medicaid Other | Admitting: Internal Medicine

## 2014-02-07 ENCOUNTER — Encounter: Payer: Self-pay | Admitting: Internal Medicine

## 2014-02-07 ENCOUNTER — Ambulatory Visit (INDEPENDENT_AMBULATORY_CARE_PROVIDER_SITE_OTHER)
Admission: RE | Admit: 2014-02-07 | Discharge: 2014-02-07 | Disposition: A | Discharge status: Home / Self Care | Payer: Medicaid Other | Source: Ambulatory Visit | Attending: Internal Medicine | Admitting: Internal Medicine

## 2014-02-07 VITALS — BP 142/80 | HR 100 | Temp 98.4°F | Ht 67.0 in | Wt 171.4 lb

## 2014-02-07 DIAGNOSIS — J4489 Other specified chronic obstructive pulmonary disease: Secondary | ICD-10-CM

## 2014-02-07 DIAGNOSIS — J449 Chronic obstructive pulmonary disease, unspecified: Secondary | ICD-10-CM

## 2014-02-07 DIAGNOSIS — G4734 Idiopathic sleep related nonobstructive alveolar hypoventilation: Secondary | ICD-10-CM

## 2014-02-07 MED ORDER — MOMETASONE FURO-FORMOTEROL FUM 200-5 MCG/ACT IN AERO: INHALATION_SPRAY | RESPIRATORY_TRACT | Status: DC

## 2014-02-07 MED ORDER — FUROSEMIDE 20 MG PO TABS: 20.0000 mg | ORAL_TABLET | Freq: Every day | ORAL | Status: DC

## 2014-02-07 NOTE — Progress Notes (Signed)
Subjective:    Patient ID: Allison [name], female    DOB: Oct 06, 1950, 63 y.o.   MRN: [id number]  HPI  #COPD  - gold stage 3 - MS phenotpe - medicaid refused rehab  #Smoking   reports that she quit smoking about 3 years ago. Her smoking use included Cigarettes. She has a 45 pack-year smoking history. She has never used smokeless tobacco.  #imgaging data - May 20-12: clear cxr. No CT data - Dec 2014: she is open for low dose CT but will save $  #Alpha 1   - MS  #Thrusus  - July 2014 on symbicort - dec 2014 on bre0   #Lung cancre screen  - discussed dec 2014: will wait till [place] approves and she is age 8    07/13/13  Follow up COPD  Patient returns for 4 week followup for COPD Last visit. Patient was experiencing recurrent thrush. She was treated with a seven-day course of Diflucan. Patient reports it to go away, however, has returned now. Patient is on chronic inhalers, which she believes is causing her symptoms. Patient was continued on BREO  Last visit. However, patient feels that her breathing is not as good on this and it causes a tickle in her throat. She denies any hemoptysis, orthopnea, PND, or leg swelling.   REC Stop Breo  Begin Dulera 2 puffs Twice daily  -rinse after use.  Try Armor Hammer Baking soda/peroxide rinse or peroxide mixed in water for rinse.  Stop Cozaar  Begin Benicar 40mg  daily  Mycelex troche fives times daily for 10 days .  Follow up Dr. Chase Caller 4 weeks and As needed   Please contact office for sooner follow up if symptoms do not improve or worsen or seek emergency care    OV 08/19/2013 Chief Complaint  Patient presents with  . Follow-up    Pt reports starting back Breo--Dulera did not work for pt. Pt states that she feels the trush is cleared up. Given Mycelex troche to use fives times daily for 10 days by TP. Pt is also back on Cozaar     Followup COPD:Overall she's doing well. COPD stable. She really appreciate Spiriva and  Brio  combination. Last month NP asked her tor try dulera due to thursh ahd cough but she feels BREO better and so back onit. She was on aslo changed from cozaar to benicar but now back on benicar. No interim exacerbations, new medical problems, prednisone burst, antibiotics, emergency room visit to urgent care visits or hospitalizations. She will have the flu shot today for this season.   Smoking: smoking is still in remission.  Thrush: resolved with NP recs at loast ov     CAT COPD Symptom & Quality of Life Score ([employer] trademark) 0 is no burden. 5 is highest burden 09/29/2012  11/16/2012 On spiriva and symbicort 01/24/2013 On spiriva and breo 08/19/2013   Never Cough -> Cough all the time 2 0 1   No phlegm in chest -> Chest is full of phlegm 2 1 0   No chest tightness -> Chest feels very tight 0 3 0   No dyspnea for 1 flight stairs/hill -> Very dyspneic for 1 flight of stairs 4 3 1    No limitations for ADL at home -> Very limited with ADL at home 4 3 2    Confident leaving home -> Not at all confident leaving home 1 3 0   Sleep soundly -> Do not sleep soundly because of lung  condition 1 3 0   Lots of Energy -> No energy at all 3 3 2    TOTAL Score (max 40)  17 16 6        Current outpatient prescriptions:albuterol (PROVENTIL HFA;VENTOLIN HFA) 108 (90 BASE) MCG/ACT inhaler, Inhale 2 puffs into the lungs every 6 (six) hours as needed for wheezing., Disp: 2 Inhaler, Rfl: 6;  aspirin 81 MG tablet, Take 81 mg by mouth daily.  , Disp: , Rfl: ;  clopidogrel (PLAVIX) 75 MG tablet, Take 1 tablet (75 mg total) by mouth daily., Disp: 30 tablet, Rfl: 11 clotrimazole (MYCELEX) 10 MG troche, Take 1 tablet (10 mg total) by mouth 5 (five) times daily., Disp: 50 tablet, Rfl: 0;  escitalopram (LEXAPRO) 10 MG tablet, Take 1 tablet (10 mg total) by mouth daily., Disp: 30 tablet, Rfl: 11;  Fluticasone Furoate-Vilanterol (BREO ELLIPTA) 100-25 MCG/INH AEPB, Inhale into the lungs. Take 1 puff daily, Disp: , Rfl: ;   losartan (COZAAR) 50 MG tablet, Take 50 mg by mouth daily., Disp: , Rfl:  metoprolol (LOPRESSOR) 50 MG tablet, Take 1 tablet (50 mg total) by mouth 2 (two) times daily., Disp: 60 tablet, Rfl: 11;  simvastatin (ZOCOR) 20 MG tablet, Take 1 tablet (20 mg total) by mouth every evening., Disp: 30 tablet, Rfl: 11;  tiotropium (SPIRIVA) 18 MCG inhalation capsule, Place 18 mcg into inhaler and inhale daily., Disp: , Rfl:  triamterene-hydrochlorothiazide (MAXZIDE-25) 37.5-25 MG per tablet, Take 1 tablet by mouth daily., Disp: 30 tablet, Rfl: 11 rec - continue BREO daily and spiriva daily; rinse after use   Admit date: 01/27/2014  Discharge date: 01/31/2014  Discharge Diagnoses:  1. Left renal mass with pulmonary nodules bilaterally, secondary to metastatic renal cell carcinoma until proven otherwise.  --- Mass measuring 6.4 x 5.4 x 5.9 cm.  2. Acute pyelonephritis.  3. Gross hematuria, secondary to #1 and #2.  4. Ischemic cardiomyopathy with improved ejection fraction of 55-60%.  5. Mild pulmonary edema, secondary to volume resuscitation.  6. Chronic atrial fibrillation, on Plavix.  7. Hypertension, with low normal blood pressures during hospitalization.  8. Hyponatremia, secondary to hypovolemia.  9. Acute blood loss anemia from hematuria. Hemoglobin 9.3 at the time of discharge.    02/07/2014 acute extended post hosp f/u  ov/Wert re: worse sob/ no cough  Chief Complaint  Patient presents with  . Acute Visit    Pt c/o increased DOE for the past 2 wks. She states that breathing is esp worse in the evenings.  She gets SOB walking from room to room.  She is using rescue inhaler 2 x daily on average.   better while in hosp on dulera and also got neb there but home needs breo/neb/hfa and losing ground, sleeping on 3 pillows, no cough at all  During the day on 02 walking no problem with walking but by 6pm room to room even on 02  Has never qualified for 02 per pt and pays for it out of pocket, wants amb  02 also but even in hosp did not need 02 walking    No obvious day to day or daytime variabilty or assoc chronic cough or cp or chest tightness, subjective wheeze overt sinus or hb symptoms. No unusual exp hx or h/o childhood pna/ asthma or knowledge of premature birth.  Sleeping ok without nocturnal  or early am exacerbation  of respiratory  c/o's or need for noct saba. Also denies any obvious fluctuation of symptoms with weather or environmental changes or other  aggravating or alleviating factors except as outlined above   Current Medications, Allergies, Complete Past Medical History, Past Surgical History, Family History, and Social History were reviewed in [employer] record.  ROS  The following are not active complaints unless bolded sore throat, dysphagia, dental problems, itching, sneezing,  nasal congestion or excess/ purulent secretions, ear ache,   fever, chills, sweats, unintended wt loss, pleuritic or exertional cp, hemoptysis,  orthopnea pnd or leg swelling, presyncope, palpitations, heartburn, abdominal pain, anorexia, nausea, vomiting, diarrhea  or change in bowel or urinary habits, change in stools or urine, dysuria,hematuria,  rash, arthralgias, visual complaints, headache, numbness weakness or ataxia or problems with walking or coordination,  change in mood/affect or memory.                  Objective:   Physical Exam amb wf nad  Wt Readings from Last 3 Encounters:  02/07/14 171 lb 6.4 oz (77.747 kg)  01/28/14 173 lb 1 oz (78.5 kg)  08/19/13 174 lb 3.2 oz (79.017 kg)      HEENT mild turbinate edema.  Oropharynx no thrush or excess pnd or cobblestoning.  No JVD or cervical adenopathy. Mild accessory muscle hypertrophy. Trachea midline, nl thryroid. Chest was hyperinflated by percussion with diminished breath sounds and moderate increased exp time without wheeze. Hoover sign positive at mid inspiration. Regular rate and rhythm without murmur  gallop or rub or increase P2 - 1+ pitting edema LE bilaterally .  Abd: no hsm, nl excursion. Ext warm without cyanosis or clubbing.     CXR  02/07/2014 :  1. Partial interval improvement in bilateral interstitial edema     Lab Results  Component Value Date   PROBNP 692.0* 08/13/2009      Chemistry      Component Value Date/Time   NA 134* 01/31/2014 0625   K 4.8 01/31/2014 0625   CL 100 01/31/2014 0625   CO2 25 01/31/2014 0625   BUN 10 01/31/2014 0625   CREATININE 0.58 01/31/2014 0625      Component Value Date/Time   CALCIUM 8.9 01/31/2014 0625   ALKPHOS 106 01/27/2014 2109   AST 18 01/27/2014 2109   ALT 12 01/27/2014 2109   BILITOT 0.4 01/27/2014 2109      Lab Results  Component Value Date   WBC 10.3 01/31/2014   HGB 9.3* 01/31/2014   HCT 28.8* 01/31/2014   MCV 95.0 01/31/2014   PLT 296 01/31/2014          Assessment & Plan:

## 2014-02-07 NOTE — Assessment & Plan Note (Signed)
Need to try to qualify her for noct 02 instead of having her pay for it

## 2014-02-07 NOTE — Assessment & Plan Note (Addendum)
11/16/12  PFTs  1.03 (37%) ratio 43 and dlco 56 corrects to 63 %   Symptoms are markedly disproportionate to objective findings and not clear this is a lung problem but pt does appear to have difficult airway management issues. DDX of  difficult airways management all start with A and  include Adherence, Ace Inhibitors, Acid Reflux, Active Sinus Disease, Alpha 1 Antitripsin deficiency, Anxiety masquerading as Airways dz,  ABPA,  allergy(esp in young), Aspiration (esp in elderly), Adverse effects of DPI,  Active smokers, plus two Bs  = Bronchiectasis and Beta blocker use..and one C= CHF   Adherence is always the initial "prime suspect" and is a multilayered concern that requires a "trust but verify" approach in every patient - starting with knowing how to use medications, especially inhalers, correctly, keeping up with refills and understanding the fundamental difference between maintenance and prns vs those medications only taken for a very short course and then stopped and not refilled.  The proper method of use, as well as anticipated side effects, of a metered-dose inhaler are discussed and demonstrated to the patient. Improved effectiveness after extensive coaching during this visit to a level of approximately  75% so try off breo and on dulera 200 2bid trial  Adverse effect of dpi breo > try hfa dulera as she had good results from this as inpt and requesting retrial  ? chf > note bnp elevated, cxr c/w edema > add lasix 20 mg daily   ? Active smoking > denies as of 2011   See instructions for specific recommendations which were reviewed directly with the patient who was given a copy with highlighter outlining the key components.   Marland Kitchen

## 2014-02-07 NOTE — Telephone Encounter (Signed)
New Message  Pt called will need Surgical Clearance and Review Metoprolol for change. Pt states she will need a sooner appt than 08/27. Please assist

## 2014-02-07 NOTE — Patient Instructions (Addendum)
Start lasix 20 mg every day and take with one potassium  Please remember to go to the x-ray department downstairs for your tests - we will call you with the results when they are available.  Stop breo and start dulera 200  Take 2 puffs first thing in am and then another 2 puffs about 12 hours later and your need for rescue therapy should decline   Only use your albuterol(proair) as a rescue medication to be used if you can't catch your breath by resting or doing a relaxed purse lip breathing pattern.  - The less you use it, the better it will work when you need it. - Ok to use up to 2 puffs  every 4 hours if you must but call for immediate appointment if use goes up over your usual need - Don't leave home without it !!  (think of it like the spare tire for your car)   Only use your nebulizer if can't catch your breath after using your proaire  Work on inhaler technique:  relax and gently blow all the way out then take a nice smooth deep breath back in, triggering the inhaler at same time you start breathing in.  Hold for up to 5 seconds if you can.  Rinse and gargle with water when done    Keep planned follow up with cardiology and Dr Chase Caller  Please see patient coordinator before you leave today  to schedule ono RA to qualify you for 02

## 2014-02-07 NOTE — Telephone Encounter (Signed)
Called spoke w/ erica. She already found out why MW ordered ONO. Nothing further needed

## 2014-02-07 NOTE — Telephone Encounter (Signed)
Pt needs nephrectomy for renal cell CA.  Pt has an appt 03/02/14 with Dr Angelena Form. Pt states her urologist will not refer to a surgeon until she has been cleared by Dr Angelena Form. Pt requesting sooner appt with Dr Angelena Form so arrangements can be made for nephrectomy ASAP.   I will forward to Pat/Dr Angelena Form.

## 2014-02-07 NOTE — Progress Notes (Signed)
Quick Note:  Spoke with pt and notified of results per Dr. Wert. Pt verbalized understanding and denied any questions.  ______ 

## 2014-02-08 ENCOUNTER — Ambulatory Visit: Payer: Medicaid Other | Admitting: Cardiology

## 2014-02-08 NOTE — Telephone Encounter (Signed)
Pt saw MW on 8/4 and was instructed to stop the [place] and start [facility]. Will not send in PA for Breo. Nothing further needed.

## 2014-02-08 NOTE — Telephone Encounter (Signed)
Spoke with pt and appt made for her to see Dr. Angelena Form on February 09, 2014 at 3:00

## 2014-02-08 NOTE — Telephone Encounter (Signed)
Allison [name], We can do 3 pm or 4:15 tomorrow if she can make that. chris

## 2014-02-09 ENCOUNTER — Encounter: Payer: Self-pay | Admitting: Cardiovascular Disease

## 2014-02-09 ENCOUNTER — Ambulatory Visit (INDEPENDENT_AMBULATORY_CARE_PROVIDER_SITE_OTHER): Payer: Medicaid Other | Admitting: Cardiovascular Disease

## 2014-02-09 VITALS — BP 142/76 | HR 90 | Ht 67.0 in | Wt 168.0 lb

## 2014-02-09 DIAGNOSIS — I4819 Other persistent atrial fibrillation: Secondary | ICD-10-CM

## 2014-02-09 DIAGNOSIS — I739 Peripheral vascular disease, unspecified: Secondary | ICD-10-CM

## 2014-02-09 DIAGNOSIS — I10 Essential (primary) hypertension: Secondary | ICD-10-CM

## 2014-02-09 DIAGNOSIS — I4891 Unspecified atrial fibrillation: Secondary | ICD-10-CM

## 2014-02-09 DIAGNOSIS — I2510 Atherosclerotic heart disease of native coronary artery without angina pectoris: Secondary | ICD-10-CM

## 2014-02-09 MED ORDER — METOPROLOL TARTRATE 50 MG PO TABS: 75.0000 mg | ORAL_TABLET | Freq: Two times a day (BID) | ORAL | Status: DC

## 2014-02-09 NOTE — Progress Notes (Addendum)
History of Present Illness: 63 yo WF with history of HTN, hyperlipidemia, CAD with prior NSTEMI, respiratory failure February 2011, PAD here today for cardiac follow up. Her PV issues are followed by Dr. Trula Slade with [facility]. She underwent cardiac cath in February 2011 in the setting of a NSTEMI showing severe stenosis of the Circumflex, now s/p DES to the Circumflex. Her EF was found to be 30%, presumed to be non-ischemic in etiology. Echo on 08/14/09 with EF of 40-45%. She had left femoral to above knee popliteal bypass on Nov 15, 2010. She did well and has been followed by Dr. Trula Slade for graft surveillance. Angiogram 12/16/11 with patent left femoral to popliteal artery bypass graft with 50-60% left common femoral artery stenosis. Conservative management pursued secondary to the small size of the vessel. Echo March 2013 with normal LV size and function and no significant valvular issues. Carotid artery dopplers May 2013 in [facility] with 40-59% RICA stenosis and less than 03% LICA stenosis. Stress myoview 11/10/12 without ischemia. Echo 01/28/14 with normal LV function, trivial MR, MAD, biatrial enlargment. Admitted to [facility] July 2015 with abdominal pain, flank pain and hematuria. Found to have left renal cell mass c/w renal cell carcinoma.   She is here today for cardiac follow up. She has plans for resection of renal cell carcinoma. Breathing is at baseline on supplemental O2. She reports no chest pain. Her legs feel great. She is not smoking.   Primary Care Physician: [facility] (Dr. Melina Modena)  Last Lipid Profile:Lipid Panel     Component Value Date/Time   CHOL 171 11/30/2012 1122   TRIG 66.0 11/30/2012 1122   HDL 64.70 11/30/2012 1122   CHOLHDL 3 11/30/2012 1122   VLDL 13.2 11/30/2012 1122   Reasnor 93 11/30/2012 1122    Past Medical History  Diagnosis Date  . Hypertension   . COPD (chronic obstructive pulmonary disease)   . Tobacco abuse     hx of  . Coronary artery disease     PCI, DES  circumflex 2/11. moderate disease LAD  . Hyperlipidemia   . PVD (peripheral vascular disease)     lower extremity runoff 05/08/10  . Ischemic cardiomyopathy     Echo 08/14/09 with LVEF 40-45%.   . A-fib   . 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 Acute pyelonephritis 01/28/2014  . Ischemic cardiomyopathy     improved EF of 55-60% (01/28/14)    Past Surgical History  Procedure Laterality Date  . Tonsillectomy    . Coronary stent placement    . Femoral bypass Left May 2012  . Lower extremity angiogram  December 16, 2011    Current Outpatient Prescriptions  Medication Sig Dispense Refill  . albuterol (PROVENTIL HFA;VENTOLIN HFA) 108 (90 BASE) MCG/ACT inhaler Inhale 2 puffs into the lungs every 6 (six) hours as needed for wheezing.  1 Inhaler  5  . ALPRAZolam (XANAX) 0.5 MG tablet Take 1 tablet (0.5 mg total) by mouth daily as needed for anxiety or sleep.  20 tablet  0  . clopidogrel (PLAVIX) 75 MG tablet Take 1 tablet (75 mg total) by mouth daily. Stop this medicine if you see blood in your urine.      Marland Kitchen escitalopram (LEXAPRO) 10 MG tablet Take 1 tablet (10 mg total) by mouth daily.  30 tablet  6  . furosemide (LASIX) 20 MG tablet Take 1 tablet (20 mg total) by mouth daily.  30 tablet  11  . levalbuterol (XOPENEX) 0.63  MG/3ML nebulizer solution Take 3 mLs (0.63 mg total) by nebulization every 8 (eight) hours.  3 mL  12  . metoprolol (LOPRESSOR) 50 MG tablet Take 1 tablet (50 mg total) by mouth 2 (two) times daily.  60 tablet  1  . mometasone-formoterol (DULERA) 200-5 MCG/ACT AERO Take 2 puffs first thing in am and then another 2 puffs about 12 hours later.  1 Inhaler  11  . potassium chloride (K-DUR) 10 MEQ tablet Take 1 tablet (10 mEq total) by mouth daily as needed (Take with Lasix).  30 tablet  0  . simvastatin (ZOCOR) 20 MG tablet Take 1 tablet (20 mg total) by mouth every evening.  30 tablet  11  . tiotropium (SPIRIVA) 18 MCG inhalation capsule Place 1 capsule (18 mcg total)  into inhaler and inhale daily.  30 capsule  6   No current facility-administered medications for this visit.    Allergies  Allergen Reactions  . Ace Inhibitors Cough  . Amoxicillin-Pot Clavulanate Hives  . Codeine Nausea And Vomiting    History   Social History  . Marital Status: Married    Spouse Name: N/A    Number of Children: 3  . Years of Education: N/A   Occupational History  . sculptor    Social History Main Topics  . Smoking status: Former Smoker -- 1.50 packs/day for 30 years    Types: Cigarettes    Quit date: 08/12/2009  . Smokeless tobacco: Never Used     Comment: she has smoker 1-2 packs daily since the age of 40. 51 pack years  . Alcohol Use: No  . Drug Use: No  . Sexual Activity: Not on file   Other Topics Concern  . Not on file   Social History Narrative  . No narrative on file    Family History  Problem Relation Age of Onset  . Coronary artery disease Mother     deceased. coronary artery bypass graft  . Cancer Mother     BREAST  . Heart disease Mother     PVD  - Amputation-Leg  . Hyperlipidemia Mother   . Hypertension Mother   . Heart attack Mother   . Cirrhosis Father     deceased  . Cancer Father   . Other Brother     alive, unknown hx    Review of Systems:  As stated in the HPI and otherwise negative.   BP 142/76  Pulse 90  Ht 5\' 7"  (1.702 m)  Wt 168 lb (76.204 kg)  BMI 26.31 kg/m2  Physical Examination: General: Well developed, well nourished, NAD HEENT: OP clear, mucus membranes moist SKIN: warm, dry. No rashes. Neuro: No focal deficits Musculoskeletal: Muscle strength 5/5 all ext Psychiatric: Mood and affect normal Neck: No JVD, no carotid bruits, no thyromegaly, no lymphadenopathy. Lungs:Clear bilaterally, no wheezes, rhonci, crackles Cardiovascular: Regular rate and rhythm. No murmurs, gallops or rubs. Abdomen:Soft. Bowel sounds present. Non-tender.  Extremities: No lower extremity edema. Pulses are not palpable in  the bilateral DP/PT.  Stress myoview 11/10/12: Impression  Exercise Capacity: Lexiscan with no exercise.  BP Response: Normal blood pressure response.  Clinical Symptoms: There is dyspnea.  ECG Impression: No significant ST segment change suggestive of ischemia.  Comparison with Prior Nuclear Study: No previous nuclear study performed  Overall Impression: Low risk stress nuclear study Moderate sized inferior wall infarct at mid and basal level with no ischemia.  LV Ejection Fraction: 64%. LV Wall Motion: LV appears enlarged with inferobasal hypokinesis  but EF calculates as normal. Suggest echo correlation  Echo 01/28/14: Left ventricle: The cavity size was normal. Wall thickness was normal. Systolic function was normal. The estimated ejection fraction was in the range of 55% to 60%. Probable hypokinesis of the basal-midinferolateral and inferior myocardium. The study is not technically sufficient to allow evaluation of LV diastolic function. - Aortic valve: Mildly calcified annulus. Trileaflet; mildly calcified leaflets. There was no significant regurgitation. - Mitral valve: Calcified annulus. There was trivial regurgitation. - Left atrium: The atrium was moderately dilated. - Right atrium: The atrium was moderately dilated. Central venous pressure (est): 8 mm Hg. - Tricuspid valve: There was trivial regurgitation. - Pulmonary arteries: Systolic pressure could not be accurately estimated. - Pericardium, extracardiac: There was no pericardial effusion. Impressions: - Normal LV chamber size and wall thickness with LVEF 55-60%, probable hypokinesis of the mid to basal inferolateral wall. Indeterminate diastolic function in the setting of atrial fibrillation. Moderate biatrial enlargement. MAC with trivial mitral regurgitation. Unable to assess PASP.  EKG: Atrial fib, rate 110 bpm. Non-specific ST abnormality  Assessment and Plan:   1. CAD: Stable. Will continue current medical  therapy. Will arrange stress test.   2. PAD: Followed in [facility]   3. Hyperlipidemia: Continue statin.   4. COPD: Followed in [facility]   5. Pre-operative cardiovascular examination: Will arrange Lexiscan stress myoview to exclude ischemia with known moderate CAD. No chest pain.   6. Atrial fibrillation: New diagnosis while hospitalized July 2015 at [facility]. She is in atrial fib today. Rate 90-110. Will increase Lopressor to 75 mg po BID for better rate control. I have discussed anti-coagulation for stroke prevention but will not start today as she may have surgery in next 2-3 weeks. Also recent hematuria. She is on Plavix and tolerating without further hematura. Would likely start novel anti-coagulant after her renal cell carcinoma resection.   7. Renal cell carcinoma: Plans to see surgery once cleared by cardiology.

## 2014-02-09 NOTE — Patient Instructions (Addendum)
Your physician recommends that you schedule a follow-up appointment in:  6 weeks.   Your physician has requested that you have a lexiscan myoview. For further information please visit [email or web address]. Please follow instruction sheet, as given.   Your physician has recommended you make the following change in your medication:  Increase lopressor to 75 mg by mouth twice daily

## 2014-02-11 ENCOUNTER — Encounter: Payer: Self-pay | Admitting: Internal Medicine

## 2014-02-14 ENCOUNTER — Encounter: Payer: Medicaid Other | Admitting: Physician Assistant

## 2014-02-14 ENCOUNTER — Telehealth: Payer: Self-pay | Admitting: Internal Medicine

## 2014-02-14 DIAGNOSIS — G4734 Idiopathic sleep related nonobstructive alveolar hypoventilation: Secondary | ICD-10-CM

## 2014-02-14 NOTE — Telephone Encounter (Signed)
Per MW- ONO on RA pos for desat  Pt needs to cont noct o2 2 lpm and repeat ONO on 2lpm  Spoke with pt and notified of results per Dr. Melvyn Novas. Pt verbalized understanding and denied any questions. Order was sent to [facility]

## 2014-02-17 ENCOUNTER — Other Ambulatory Visit: Payer: Self-pay | Admitting: Urology

## 2014-02-17 ENCOUNTER — Telehealth: Payer: Self-pay | Admitting: Cardiovascular Disease

## 2014-02-17 NOTE — Telephone Encounter (Signed)
Selita from [facility] calling to update Dr Angelena Form and nurse that the pt has a noted mass on her left kidney, and is scheduled for a robotic left nephrectomy on 9/16.  Pt is scheduled to come in and see PA Richardson Dopp on 9/2 for clearance, and is also scheduled for a stress test on 8/18 and a f/u OV with Dr Angelena Form on 9/14.  Pt is aware of scheduled appts and plan. Selita states she will also be faxing clearance to Dr Angelena Form for pt to come off of Plavix 5 days prior to surgery.  Confirmed fax information will Selita.  Informed Selita that Dr Angelena Form and nurse are out of the office today, but I will route this message to both of them for further review and recommendation.  Selita verbalized understanding and agrees with this plan.

## 2014-02-17 NOTE — Telephone Encounter (Signed)
LMTCB

## 2014-02-17 NOTE — Telephone Encounter (Signed)
New Prob    Pt will have a robotic L nephrectomy scheduled for 9/16. Appointment scheduled with Richardson Dopp PA-C 9/2 for clearance.

## 2014-02-17 NOTE — Telephone Encounter (Signed)
Will sign papers when they arrive. Darlina Guys

## 2014-02-20 ENCOUNTER — Other Ambulatory Visit (INDEPENDENT_AMBULATORY_CARE_PROVIDER_SITE_OTHER): Payer: Medicaid Other

## 2014-02-20 ENCOUNTER — Other Ambulatory Visit: Payer: Self-pay | Admitting: Urology

## 2014-02-20 ENCOUNTER — Encounter: Payer: Self-pay | Admitting: Internal Medicine

## 2014-02-20 ENCOUNTER — Ambulatory Visit (INDEPENDENT_AMBULATORY_CARE_PROVIDER_SITE_OTHER): Payer: Medicaid Other | Admitting: Internal Medicine

## 2014-02-20 VITALS — BP 128/70 | HR 112 | Ht 67.0 in | Wt 161.4 lb

## 2014-02-20 DIAGNOSIS — J449 Chronic obstructive pulmonary disease, unspecified: Secondary | ICD-10-CM | POA: Diagnosis not present

## 2014-02-20 DIAGNOSIS — R911 Solitary pulmonary nodule: Secondary | ICD-10-CM

## 2014-02-20 DIAGNOSIS — Z01811 Encounter for preprocedural respiratory examination: Secondary | ICD-10-CM

## 2014-02-20 DIAGNOSIS — J4489 Other specified chronic obstructive pulmonary disease: Secondary | ICD-10-CM | POA: Diagnosis not present

## 2014-02-20 LAB — BASIC METABOLIC PANEL
BUN: 13 mg/dL (ref 6–23)
CALCIUM: 10.3 mg/dL (ref 8.4–10.5)
CHLORIDE: 103 meq/L (ref 96–112)
CO2: 27 mEq/L (ref 19–32)
CREATININE: 0.8 mg/dL (ref 0.4–1.2)
GFR: 75.2 mL/min (ref >60.00)
Glucose, Bld: 110 mg/dL — ABNORMAL HIGH (ref 70–99)
Potassium: 5.1 mEq/L (ref 3.5–5.1)
Sodium: 142 mEq/L (ref 135–145)

## 2014-02-20 LAB — CBC
HEMATOCRIT: 37.5 % (ref 36.0–46.0)
HEMOGLOBIN: 12.3 g/dL (ref 12.0–15.0)
MCHC: 32.8 g/dL (ref 30.0–36.0)
MCV: 90.9 fl (ref 78.0–100.0)
PLATELETS: 552 10*3/uL — AB (ref 150.0–400.0)
RBC: 4.13 Mil/uL (ref 3.87–5.11)
RDW: 16 % — ABNORMAL HIGH (ref 11.5–15.5)
WBC: 11.4 10*3/uL — ABNORMAL HIGH (ref 4.0–10.5)

## 2014-02-20 MED ORDER — MOMETASONE FURO-FORMOTEROL FUM 200-5 MCG/ACT IN AERO: INHALATION_SPRAY | RESPIRATORY_TRACT | Status: DC

## 2014-02-20 MED ORDER — TIOTROPIUM BROMIDE MONOHYDRATE 18 MCG IN CAPS: 18.0000 ug | ORAL_CAPSULE | Freq: Every day | RESPIRATORY_TRACT | Status: DC

## 2014-02-20 NOTE — Patient Instructions (Addendum)
#  COPD  - appears stable  - continue o2  - continue spiriva and dulera daily ; take refill - flu shot in fall asap  #Preoperative respiratory exam  - I think moderate risk for pulmonary complications following kidney removal - check cbc and bmet - Will talk to Dr Tresa Moore to clarify risk further  #Lung nodule Right lower lobe 1cm - presumed due to kidney cancer  - need repeat ct chest end sept 2015; 2 months ct chest but I will see you first before ordering  #Folllowup  6 weeks or sooner if needed

## 2014-02-20 NOTE — Progress Notes (Signed)
Subjective:    Patient ID: Allison [name], female    DOB: 20-Oct-1950, 63 y.o.   MRN: [id number]  HPI   #COPD  - gold stage 3 - MS phenotpe - medicaid refused rehab  #Smoking   reports that she quit smoking about 3 years ago. Her smoking use included Cigarettes. She has a 45 pack-year smoking history. She has never used smokeless tobacco.  #imgaging data - May 20-12: clear cxr. No CT data - Dec 2014: she is open for low dose CT but will save $  #Alpha 1   - MS  #Thrushs  - July 2014 on symbicort - dec 2014 on bre0   #Lung cancer screen   OV 02/20/2014  Chief Complaint  Patient presents with  . Follow-up    Pt last seen by MW for an acute visit on 02/07/2014. Pt states she needs sx clearance for removal of left kidney. Pt states she was started on [employer] and she has noticed her breathing has improved. Pt c/o DOE but is taking O2 off throughout the day to "stengthen her lungs". Pt denies cough and CP.     #COPD - stable disease. COPD CAT Score 13 and entered in our flow sheet. Saw Dr Melvyn Novas 02/07/14 and breo changed to [place] and this helped. CXR 02/07/14 shows clear lung fields. Compliant with o2, spiriva and dulera  #Right lower lobe lung nodule end July 2015 1cm: presumed due to left kidney mass  #Left kidney cancder 6cm - high pre-test prob: Preop Resp Eval - diagnosed end July 2015 while admitted for hematuria. Due for nephrectomy by Dr Tresa Moore followed by ? [facility]. Wants preop pulmoanry evaluation. She is ambulatory, independent, normal renal function , hgb> 10gm%, and albumin > 3. Her surgery will be 90 minutes and laparascopically per Dr Tresa Moore and some risk for pneumoperitoenum with minimal blood loss. Surgery date is 03/22/14. Risk increasing factors are , copd, and age of 56  Labs 02/20/14   - hgb 12gm%, creat 0.8mg %, Na 143   Preop evaluation details   [facility] Postperative Pulmonary Risk Score Comment Score  Type of surgery - abd ao aneurysm (27), thoracic (21),  neurosurgery / upper abdominal / vascular (21), neck (11) Lap retroperit approach 0  Emergency Surgery - (11) Elective surgery 0  ALbumin < 3 or poor nutritional state - (9) Good nutritional state 0  BUN > 30 -  (8) normal 0  Partial or completely dependent functional status - (7) funcitonal 0  COPD -  (6) Copd + 6  Age - 67 to 31 (4), > 70  (6) Age 63 4  TOTAL    Risk Stratifcation scores  - < 10, 11-19, 20-27, 28-40, >40 Low risk 10     CANET Postperative Pulmonary Risk Score Comment Score  Age - <50 (0), 50-80 (3), >80 (16) Age 40 3  Preoperative pulse ox - >96 (0), 91-95 (8), <90 (24) On o2 24  Respiratory infection in last month - Yes (17) none 0  Preoperative anemia - < 10gm% - Yes (11) hgb 12gm% 0  Surgical incision - Upper abdominal (15), Thoracic (24) none 0  Duration of surgery - <2h (0), 2-3h (16), >3h (23) 90 minutes 0  Emergency Surgery - Yes (8) Elective surgery 0  TOTAL  27  Risk Stratification - Low (<26), Intermediate (26-44), High (>45)  Low-Intermediate     Review of Systems  Constitutional: Negative for fever and unexpected weight change.  HENT: Negative for congestion,  dental problem, ear pain, nosebleeds, postnasal drip, rhinorrhea, sinus pressure, sneezing, sore throat and trouble swallowing.   Eyes: Negative for redness and itching.  Respiratory: Positive for shortness of breath. Negative for cough, chest tightness and wheezing.   Cardiovascular: Negative for palpitations and leg swelling.  Gastrointestinal: Negative for nausea and vomiting.  Genitourinary: Negative for dysuria.  Musculoskeletal: Negative for joint swelling.  Skin: Negative for rash.  Neurological: Negative for headaches.  Hematological: Does not bruise/bleed easily.  Psychiatric/Behavioral: Negative for dysphoric mood. The patient is not nervous/anxious.        Objective:   Physical Exam  Vitals reviewed. Constitutional: She is oriented to person, place, and time. She appears  well-developed and well-nourished. No distress.  HENT:  Head: Normocephalic and atraumatic.  Right Ear: External ear normal.  Left Ear: External ear normal.  Mouth/Throat: Oropharynx is clear and moist. No oropharyngeal exudate.  o2 on  Eyes: Conjunctivae and EOM are normal. Pupils are equal, round, and reactive to light. Right eye exhibits no discharge. Left eye exhibits no discharge. No scleral icterus.  Neck: Normal range of motion. Neck supple. No JVD present. No tracheal deviation present. No thyromegaly present.  Cardiovascular: Normal rate, regular rhythm, normal heart sounds and intact distal pulses.  Exam reveals no gallop and no friction rub.   No murmur heard. Pulmonary/Chest: Effort normal and breath sounds normal. No respiratory distress. She has no wheezes. She has no rales. She exhibits no tenderness.  Mild overall decreased AE  Abdominal: Soft. Bowel sounds are normal. She exhibits no distension and no mass. There is no tenderness. There is no rebound and no guarding.  Musculoskeletal: Normal range of motion. She exhibits no edema and no tenderness.  Lymphadenopathy:    She has no cervical adenopathy.  Neurological: She is alert and oriented to person, place, and time. She has normal reflexes. No cranial nerve deficit. She exhibits normal muscle tone. Coordination normal.  Skin: Skin is warm and dry. No rash noted. She is not diaphoretic. No erythema. No pallor.  Psychiatric: She has a normal mood and affect. Her behavior is normal. Judgment and thought content normal.   Filed Vitals:   02/20/14 0922  BP: 128/70  Pulse: 112  Height: 5\' 7"  (1.702 m)  Weight: 161 lb 6.4 oz (73.211 kg)  SpO2: 98%       Assessment & Plan:  #COPD  - appears stable  - continue o2  - continue spiriva and dulera daily ; take refill - flu shot in fall asap  #Preoperative respiratory exam  - I think low-moderate risk for pulmonary complications following kidney removal - check cbc and  bmet - Will talk to Dr Tresa Moore to clarify risk further  #Lung nodule Right lower lobe 1cm - presumed due to kidney cancer  - need repeat ct chest end sept 2015; 2 months ct but I will see you first before ordering  #Folllowup  6 weeks or sooner if needed

## 2014-02-21 ENCOUNTER — Ambulatory Visit (HOSPITAL_COMMUNITY): Payer: Medicaid Other | Attending: Cardiology | Admitting: Radiology

## 2014-02-21 ENCOUNTER — Telehealth: Payer: Self-pay | Admitting: Cardiovascular Disease

## 2014-02-21 VITALS — BP 134/82 | Ht 67.0 in | Wt 156.0 lb

## 2014-02-21 DIAGNOSIS — R0602 Shortness of breath: Secondary | ICD-10-CM | POA: Diagnosis not present

## 2014-02-21 DIAGNOSIS — R079 Chest pain, unspecified: Secondary | ICD-10-CM | POA: Insufficient documentation

## 2014-02-21 DIAGNOSIS — R002 Palpitations: Secondary | ICD-10-CM | POA: Diagnosis not present

## 2014-02-21 DIAGNOSIS — Z87891 Personal history of nicotine dependence: Secondary | ICD-10-CM | POA: Diagnosis not present

## 2014-02-21 DIAGNOSIS — I10 Essential (primary) hypertension: Secondary | ICD-10-CM | POA: Insufficient documentation

## 2014-02-21 DIAGNOSIS — I779 Disorder of arteries and arterioles, unspecified: Secondary | ICD-10-CM | POA: Diagnosis not present

## 2014-02-21 DIAGNOSIS — I739 Peripheral vascular disease, unspecified: Secondary | ICD-10-CM | POA: Diagnosis not present

## 2014-02-21 DIAGNOSIS — I252 Old myocardial infarction: Secondary | ICD-10-CM | POA: Diagnosis not present

## 2014-02-21 DIAGNOSIS — I4891 Unspecified atrial fibrillation: Secondary | ICD-10-CM | POA: Insufficient documentation

## 2014-02-21 DIAGNOSIS — J449 Chronic obstructive pulmonary disease, unspecified: Secondary | ICD-10-CM | POA: Insufficient documentation

## 2014-02-21 DIAGNOSIS — J4489 Other specified chronic obstructive pulmonary disease: Secondary | ICD-10-CM | POA: Insufficient documentation

## 2014-02-21 DIAGNOSIS — I2510 Atherosclerotic heart disease of native coronary artery without angina pectoris: Secondary | ICD-10-CM | POA: Insufficient documentation

## 2014-02-21 DIAGNOSIS — Z9861 Coronary angioplasty status: Secondary | ICD-10-CM | POA: Insufficient documentation

## 2014-02-21 MED ORDER — TECHNETIUM TC 99M SESTAMIBI GENERIC - CARDIOLITE
11.0000 | Freq: Once | INTRAVENOUS | Status: AC | PRN
  Administered 2014-02-21: 11 via INTRAVENOUS

## 2014-02-21 MED ORDER — TECHNETIUM TC 99M SESTAMIBI GENERIC - CARDIOLITE
33.0000 | Freq: Once | INTRAVENOUS | Status: AC | PRN
  Administered 2014-02-21: 33 via INTRAVENOUS

## 2014-02-21 MED ORDER — REGADENOSON 0.4 MG/5ML IV SOLN
0.4000 mg | Freq: Once | INTRAVENOUS | Status: AC
  Administered 2014-02-21: 0.4 mg via INTRAVENOUS

## 2014-02-21 NOTE — Progress Notes (Signed)
Allison [name] [address] [place], [place] [address] [phone]    Cardiology Nuclear Med Study  Allison [name] is a 63 y.o. female     MRN : [id number]     DOB: 11/14/1950  Procedure Date: 02/21/2014  Nuclear Med Background Indication for Stress Test:  Evaluation for Ischemia, Stent Patency, Abnormal EKG, and Surgical Clearance for  (L) Renal Cell CA on 03-22-2014 by Alexis Frock, MD History:  COPD, CAD-MI-STENT-AFIB '14 MPI: NL EF: 64% '15 ECHO EF: 55-60% Cardiac Risk Factors: Carotid Disease, Family History - CAD, History of Smoking, Hypertension, Lipids and PVD  Symptoms:  Chest Pain, Palpitations and SOB   Nuclear Pre-Procedure Caffeine/Decaff Intake:  None > 12 hrs NPO After: 6:00pm   Lungs:  clear O2 Sat: 100% on room air. IV 0.9% NS with Angio Cath:  22g  IV Site: R Antecubital x 1, tolerated well IV Started by:  Irven Baltimore, RN  Chest Size (in):  38 Cup Size: B  Height: 5\' 7"  (1.702 m)  Weight:  156 lb (70.761 kg)  BMI:  Body mass index is 24.43 kg/(m^2). Tech Comments: Patient took Metoprolol,  Spiriva and Dulera inhaler this am. O2 Sat 100% on 2 L Nasal Cannula on arrival. Irven Baltimore, RN.    Nuclear Med Study 1 or 2 day study: 1 day  Stress Test Type:  Lexiscan  Reading MD: N/A  Order Authorizing Provider:  Lauree Chandler, MD  Resting Radionuclide: Technetium 9m Sestamibi  Resting Radionuclide Dose: 11.0 mCi   Stress Radionuclide:  Technetium 45m Sestamibi  Stress Radionuclide Dose: 33.0 mCi           Stress Protocol Rest HR: 97 Stress HR: 109  Rest BP: 134/82 Stress BP: 121/73  Exercise Time (min): n/a METS: n/a   Predicted Max HR: 159 bpm % Max HR: 68.55 bpm Rate Pressure Product: 14606   Dose of Adenosine (mg):  n/a Dose of Lexiscan: 0.4 mg  Dose of Atropine (mg): n/a Dose of Dobutamine: n/a mcg/kg/min (at max HR)  Stress Test Technologist: Perrin Maltese, EMT-P  Nuclear Technologist:  Vedia Pereyra, CNMT       Rest Procedure:  Myocardial perfusion imaging was performed at rest 45 minutes following the intravenous administration of Technetium 4m Sestamibi. Rest ECG: NSR - Normal EKG  Stress Procedure:  The patient received IV Lexiscan 0.4 mg over 15-seconds.  Technetium 55m Sestamibi injected at 30-seconds. The patient had sob, dizziness, and was hot with the Lexiscan injection. Quantitative spect images were obtained after a 45 minute delay. Stress ECG: No significant change from baseline ECG  QPS Raw Data Images:  There is interference from nuclear activity from structures below the diaphragm. This does not affect the ability to read the study. Stress Images:  There is a small severe area of attenuation of the inferior mid-base.  The uptake in the other regions is nromal.    Rest Images:  There is a small severe area of attenuation of the inferior mid-base.  The uptake in the other regions is nromal. Subtraction (SDS):  No evidence of ischemia.  There is a fixed inferior basal-mid scar. Transient Ischemic Dilatation (Normal <1.22):  1.17 Lung/Heart Ratio (Normal <0.45):  0.33  Quantitative Gated Spect Images QGS EDV:  80 ml QGS ESV:  35 ml  Impression Exercise Capacity:  Lexiscan with no exercise. BP Response:  Normal blood pressure response. Clinical Symptoms:  No significant symptoms noted. ECG Impression:  No significant ST  segment change suggestive of ischemia. Comparison with Prior Nuclear Study: No significant change from previous study from 11/11/12.    Overall Impression:  Low risk stress nuclear study .  There is evidence of a previouis mid-basal inferior MI.  There is no evidence of ischemia. .  LV Ejection Fraction: 56%.  LV Wall Motion:  There is mid-basal inferior severe hypokinesis.  The overall EF remains normal.     Thayer Headings, [name], MD, [facility] 02/21/2014, 5:34 PM [address]  [place] Pager [phone]

## 2014-02-21 NOTE — Telephone Encounter (Signed)
Walk In pt Form " Sealed Envelope" Dr.McAlhany back 8/20 will give to Him then 8.18.15/km

## 2014-02-22 ENCOUNTER — Encounter: Payer: Self-pay | Admitting: Cardiovascular Disease

## 2014-02-22 NOTE — Telephone Encounter (Signed)
Paperwork is FMLA paperwork. Given to medical records to send to [place].

## 2014-02-23 NOTE — Telephone Encounter (Signed)
Pt has been cleared for surgery. Will fax letter written by Dr. Angelena Form to [facility]. Pt does not need clearance appointment with Richardson Dopp, PA on March 08, 2014. Pt aware and this appt has been cancelled. Appt made for pt to Richardson Dopp, [place] on April 11, 2014 after surgery.

## 2014-02-24 ENCOUNTER — Encounter: Payer: Self-pay | Admitting: Internal Medicine

## 2014-02-26 DIAGNOSIS — R911 Solitary pulmonary nodule: Secondary | ICD-10-CM | POA: Insufficient documentation

## 2014-02-26 NOTE — Assessment & Plan Note (Signed)
#  COPD  - appears stable  - continue o2  - continue spiriva and dulera daily ; take refill - flu shot in fall asap

## 2014-02-26 NOTE — Assessment & Plan Note (Signed)
#  Lung nodule Right lower lobe 1cm - presumed due to kidney cancer  - need repeat ct chest end sept 2015; 2 months ct but I will see you first before ordering  #Folllowup  6 weeks or sooner if needed

## 2014-02-26 NOTE — Assessment & Plan Note (Addendum)
  Patient Risk ameliorating factors are normal renal function, funcitonal status, normal albumin, hgb > 10gm%, and lack of upper abdominal or thoracic incision and duration of surgery < 2h  Risk aggravating factors are age, copd, o2 dependence  Risk can get worse if open abdominal incision happens or duration of surgery is prlonged  As things stand currently Low-Moderate Risk for nephrectomy  Major Pulmonary risks identified in the multifactorial risk analysis are but not limited to a) pneumonia; b) recurrent intubation risk; c) prolonged or recurrent acute respiratory failure needing mechanical ventilation; d) prolonged hospitalization; e) DVT/Pulmonary embolism; f) Acute Pulmonary edema  Recommend 1. Short duration of surgery as much as possible and avoid paralytic if possible 2. Recovery in step down or ICU with Pulmonary consultation 3. DVT prophylaxis 4. Aggressive pulmonary toilet with o2, bronchodilatation, and incentive spirometry and early ambulation   D/w Dr Tresa Moore and patient   > 50% of this > 25 min visit spent in face to face counseling (15 min visit converted to 25 min)

## 2014-02-27 ENCOUNTER — Other Ambulatory Visit: Payer: Self-pay

## 2014-02-27 ENCOUNTER — Telehealth: Payer: Self-pay | Admitting: Internal Medicine

## 2014-02-27 MED ORDER — FUROSEMIDE 20 MG PO TABS: 20.0000 mg | ORAL_TABLET | Freq: Every day | ORAL | Status: DC

## 2014-02-27 MED ORDER — CLOPIDOGREL BISULFATE 75 MG PO TABS: 75.0000 mg | ORAL_TABLET | Freq: Every day | ORAL | Status: DC

## 2014-02-27 MED ORDER — POTASSIUM CHLORIDE ER 10 MEQ PO TBCR: 10.0000 meq | EXTENDED_RELEASE_TABLET | Freq: Every day | ORAL | Status: DC | PRN

## 2014-02-27 MED ORDER — SIMVASTATIN 20 MG PO TABS: 20.0000 mg | ORAL_TABLET | Freq: Every evening | ORAL | Status: DC

## 2014-02-27 NOTE — Telephone Encounter (Signed)
Per MW- ONO 2lpm was normal- should continue o2 with sleep 2lpm  ATC and inform the pt, mailbox was full, [facility]

## 2014-03-01 NOTE — Telephone Encounter (Signed)
Spoke with pt and notified of results per Dr. Wert. Pt verbalized understanding and denied any questions. 

## 2014-03-02 ENCOUNTER — Ambulatory Visit: Payer: Medicaid Other | Admitting: Cardiovascular Disease

## 2014-03-08 ENCOUNTER — Ambulatory Visit: Payer: Medicaid Other | Admitting: Physician Assistant

## 2014-03-10 ENCOUNTER — Telehealth: Payer: Self-pay | Admitting: Internal Medicine

## 2014-03-10 NOTE — Telephone Encounter (Signed)
After that visit and those insturctions I spoke to Dr Tresa Moore and explained verbally. Then I updated the note and routed to him with plan and recs and risks which is all in Assessment and subjective section. REcommend you send note to them again via fax/epic. HE can always call me on my cell which you can give to him. Rec ICU post op recovery and pccm consult post op

## 2014-03-10 NOTE — Telephone Encounter (Signed)
Called spoke with [place]. Made her aware of below. I have faxed this note to [phone]. Nothing further needed

## 2014-03-10 NOTE — Telephone Encounter (Signed)
Per 02/20/14 OV w/ MR: #Preoperative respiratory exam  - I think moderate risk for pulmonary complications following kidney removal - check cbc and bmet - Will talk to Dr Tresa Moore to clarify risk further   Called spoke with [place]. Made her aware of the above. They are needing further clarification on what the risk is? Is the surgery approved? Needs this to be stated.  She is not showing Dr. Tresa Moore has talked with MR. Please advise MR thanks

## 2014-03-15 ENCOUNTER — Encounter (HOSPITAL_COMMUNITY): Payer: Self-pay | Admitting: Pharmacy Technician

## 2014-03-17 ENCOUNTER — Encounter (HOSPITAL_COMMUNITY): Payer: Self-pay

## 2014-03-17 ENCOUNTER — Encounter (HOSPITAL_COMMUNITY)
Admission: RE | Admit: 2014-03-17 | Discharge: 2014-03-17 | Disposition: A | Discharge status: Home / Self Care | Payer: Medicaid Other | Source: Ambulatory Visit | Attending: Urology | Admitting: Urology

## 2014-03-17 DIAGNOSIS — N289 Disorder of kidney and ureter, unspecified: Secondary | ICD-10-CM | POA: Diagnosis not present

## 2014-03-17 DIAGNOSIS — Z01812 Encounter for preprocedural laboratory examination: Secondary | ICD-10-CM | POA: Insufficient documentation

## 2014-03-17 HISTORY — DX: Cardiac arrhythmia, unspecified: I49.9

## 2014-03-17 HISTORY — DX: Pneumonia, unspecified organism: J18.9

## 2014-03-17 HISTORY — DX: Anxiety disorder, unspecified: F41.9

## 2014-03-17 HISTORY — DX: Personal history of urinary calculi: Z87.442

## 2014-03-17 LAB — CBC
HCT: 37.4 % (ref 36.0–46.0)
HEMOGLOBIN: 12 g/dL (ref 12.0–15.0)
MCH: 28.4 pg (ref 26.0–34.0)
MCHC: 32.1 g/dL (ref 30.0–36.0)
MCV: 88.4 fL (ref 78.0–100.0)
Platelets: 417 10*3/uL — ABNORMAL HIGH (ref 150–400)
RBC: 4.23 MIL/uL (ref 3.87–5.11)
RDW: 14.6 % (ref 11.5–15.5)
WBC: 12.8 10*3/uL — ABNORMAL HIGH (ref 4.0–10.5)

## 2014-03-17 LAB — BASIC METABOLIC PANEL
Anion gap: 14 (ref 5–15)
BUN: 16 mg/dL (ref 6–23)
CO2: 27 mEq/L (ref 19–32)
Calcium: 10 mg/dL (ref 8.4–10.5)
Chloride: 100 mEq/L (ref 96–112)
Creatinine, Ser: 0.71 mg/dL (ref 0.50–1.10)
GFR calc Af Amer: >90 mL/min (ref >90)
GLUCOSE: 97 mg/dL (ref 70–99)
POTASSIUM: 4.4 meq/L (ref 3.7–5.3)
Sodium: 141 mEq/L (ref 137–147)

## 2014-03-17 LAB — ABO/RH: ABO/RH(D): O POS

## 2014-03-17 NOTE — Progress Notes (Signed)
Chest x-ray 02/07/14 on EP[facility], EKG 01/30/14 on EPIC

## 2014-03-17 NOTE — Patient Instructions (Signed)
[place]  03/17/2014   Your procedure is scheduled on: Wednesday 03/22/14  Report to [facility] at 10:45 AM.  Call this number if you have problems the morning of surgery [phone]: [phone]   Remember: please bring inhaler on day of surgery    Do not eat food or drink liquids After Midnight.     Take these medicines the morning of surgery with A SIP OF WATER: inhalers, metoprolol   Do not wear jewelry, make-up or nail polish.  Do not wear lotions, powders, or perfumes. You may wear deodorant.  Do not shave 48 hours prior to surgery. Men may shave face and neck.  Do not bring valuables to the hospital.  Contacts, dentures or bridgework may not be worn into surgery.  Leave suitcase in the car. After surgery it may be brought to your room.  For patients admitted to the hospital, checkout time is 11:00 AM the day of discharge.  Paulette Blanch, RN  pre op nurse call if needed [phone]    [facility] - Preparing for Surgery Before surgery, you can play an important role.  Because skin is not sterile, your skin needs to be as free of germs as possible.  You can reduce the number of germs on your skin by washing with CHG (chlorahexidine gluconate) soap before surgery.  CHG is an antiseptic cleaner which kills germs and bonds with the skin to continue killing germs even after washing. Please DO NOT use if you have an allergy to CHG or antibacterial soaps.  If your skin becomes reddened/irritated stop using the CHG and inform your nurse when you arrive at Short Stay. Do not shave (including legs and underarms) for at least 48 hours prior to the first CHG shower.  You may shave your face/neck. Please follow these instructions carefully:  1.  Shower with CHG Soap the night before surgery and the  morning of Surgery.  2.  If you choose to wash your hair, wash your hair first as usual with your  normal  shampoo.  3.  After you shampoo, rinse your hair and body thoroughly to  remove the  shampoo.                            4.  Use CHG as you would any other liquid soap.  You can apply chg directly  to the skin and wash                       Gently with a scrungie or clean washcloth.  5.  Apply the CHG Soap to your body ONLY FROM THE NECK DOWN.   Do not use on face/ open                           Wound or open sores. Avoid contact with eyes, ears mouth and genitals (private parts).                       Wash face,  Genitals (private parts) with your normal soap.             6.  Wash thoroughly, paying special attention to the area where your surgery  will be performed.  7.  Thoroughly rinse your body with warm water from the neck down.  8.  DO NOT shower/wash with your normal  soap after using and rinsing off  the CHG Soap.                9.  Pat yourself dry with a clean towel.            10.  Wear clean pajamas.            11.  Place clean sheets on your bed the night of your first shower and do not  sleep with pets. Day of Surgery : Do not apply any lotions the morning of surgery.  Please wear clean clothes to the hospital/surgery center.  FAILURE TO FOLLOW THESE INSTRUCTIONS MAY RESULT IN THE CANCELLATION OF YOUR SURGERY PATIENT SIGNATURE_________________________________  NURSE SIGNATURE__________________________________  ________________________________________________________________________  WHAT IS A BLOOD TRANSFUSION? Blood Transfusion Information  A transfusion is the replacement of blood or some of its parts. Blood is made up of multiple cells which provide different functions.  Red blood cells carry oxygen and are used for blood loss replacement.  White blood cells fight against infection.  Platelets control bleeding.  Plasma helps clot blood.  Other blood products are available for specialized needs, such as hemophilia or other clotting disorders. BEFORE THE TRANSFUSION  Who gives blood for transfusions?   Healthy volunteers who are fully  evaluated to make sure their blood is safe. This is blood bank blood. Transfusion therapy is the safest it has ever been in the practice of medicine. Before blood is taken from a donor, a complete history is taken to make sure that person has no history of diseases nor engages in risky social behavior (examples are intravenous drug use or sexual activity with multiple partners). The donor's travel history is screened to minimize risk of transmitting infections, such as malaria. The donated blood is tested for signs of infectious diseases, such as HIV and hepatitis. The blood is then tested to be sure it is compatible with you in order to minimize the chance of a transfusion reaction. If you or a relative donates blood, this is often done in anticipation of surgery and is not appropriate for emergency situations. It takes many days to process the donated blood. RISKS AND COMPLICATIONS Although transfusion therapy is very safe and saves many lives, the main dangers of transfusion include:   Getting an infectious disease.  Developing a transfusion reaction. This is an allergic reaction to something in the blood you were given. Every precaution is taken to prevent this. The decision to have a blood transfusion has been considered carefully by your caregiver before blood is given. Blood is not given unless the benefits outweigh the risks. AFTER THE TRANSFUSION  Right after receiving a blood transfusion, you will usually feel much better and more energetic. This is especially true if your red blood cells have gotten low (anemic). The transfusion raises the level of the red blood cells which carry oxygen, and this usually causes an energy increase.  The nurse administering the transfusion will monitor you carefully for complications. HOME CARE INSTRUCTIONS  No special instructions are needed after a transfusion. You may find your energy is better. Speak with your caregiver about any limitations on activity  for underlying diseases you may have. SEEK MEDICAL CARE IF:   Your condition is not improving after your transfusion.  You develop redness or irritation at the intravenous (IV) site. SEEK IMMEDIATE MEDICAL CARE IF:  Any of the following symptoms occur over the next 12 hours:  Shaking chills.  You have a temperature by mouth above  102 F (38.9 C), not controlled by medicine.  Chest, back, or muscle pain.  People around you feel you are not acting correctly or are confused.  Shortness of breath or difficulty breathing.  Dizziness and fainting.  You get a rash or develop hives.  You have a decrease in urine output.  Your urine turns a dark color or changes to pink, red, or brown. Any of the following symptoms occur over the next 10 days:  You have a temperature by mouth above 102 F (38.9 C), not controlled by medicine.  Shortness of breath.  Weakness after normal activity.  The white part of the eye turns yellow (jaundice).  You have a decrease in the amount of urine or are urinating less often.  Your urine turns a dark color or changes to pink, red, or brown. Document Released: 06/20/2000 Document Revised: 09/15/2011 Document Reviewed: 02/07/2008 [facility] Patient Information 2014 [place], [place].  _______________________________________________________________________

## 2014-03-20 ENCOUNTER — Ambulatory Visit: Payer: Medicaid Other | Admitting: Cardiovascular Disease

## 2014-03-22 ENCOUNTER — Inpatient Hospital Stay (HOSPITAL_COMMUNITY)
Admission: RE | Admit: 2014-03-22 | Discharge: 2014-03-24 | DRG: 657 | Disposition: A | Discharge status: Home / Self Care | Payer: Medicaid Other | Source: Ambulatory Visit | Attending: Urology | Admitting: Urology

## 2014-03-22 ENCOUNTER — Inpatient Hospital Stay (HOSPITAL_COMMUNITY): Payer: Medicaid Other

## 2014-03-22 ENCOUNTER — Inpatient Hospital Stay (HOSPITAL_COMMUNITY): Payer: Medicaid Other | Admitting: Certified Registered Nurse Anesthetist

## 2014-03-22 ENCOUNTER — Encounter (HOSPITAL_COMMUNITY): Payer: Self-pay | Admitting: *Deleted

## 2014-03-22 ENCOUNTER — Encounter (HOSPITAL_COMMUNITY)
Admission: RE | Disposition: A | Discharge status: Home / Self Care | Payer: Self-pay | Source: Ambulatory Visit | Attending: Urology

## 2014-03-22 ENCOUNTER — Encounter (HOSPITAL_COMMUNITY): Payer: Medicaid Other | Admitting: Certified Registered Nurse Anesthetist

## 2014-03-22 DIAGNOSIS — E785 Hyperlipidemia, unspecified: Secondary | ICD-10-CM | POA: Diagnosis present

## 2014-03-22 DIAGNOSIS — Z87442 Personal history of urinary calculi: Secondary | ICD-10-CM | POA: Diagnosis not present

## 2014-03-22 DIAGNOSIS — I739 Peripheral vascular disease, unspecified: Secondary | ICD-10-CM | POA: Diagnosis present

## 2014-03-22 DIAGNOSIS — Z87891 Personal history of nicotine dependence: Secondary | ICD-10-CM | POA: Diagnosis not present

## 2014-03-22 DIAGNOSIS — N289 Disorder of kidney and ureter, unspecified: Secondary | ICD-10-CM | POA: Diagnosis present

## 2014-03-22 DIAGNOSIS — J4489 Other specified chronic obstructive pulmonary disease: Secondary | ICD-10-CM | POA: Diagnosis present

## 2014-03-22 DIAGNOSIS — I509 Heart failure, unspecified: Secondary | ICD-10-CM | POA: Diagnosis present

## 2014-03-22 DIAGNOSIS — C649 Malignant neoplasm of unspecified kidney, except renal pelvis: Principal | ICD-10-CM | POA: Diagnosis present

## 2014-03-22 DIAGNOSIS — Z885 Allergy status to narcotic agent status: Secondary | ICD-10-CM

## 2014-03-22 DIAGNOSIS — F411 Generalized anxiety disorder: Secondary | ICD-10-CM | POA: Diagnosis present

## 2014-03-22 DIAGNOSIS — Z9861 Coronary angioplasty status: Secondary | ICD-10-CM

## 2014-03-22 DIAGNOSIS — I4891 Unspecified atrial fibrillation: Secondary | ICD-10-CM | POA: Diagnosis present

## 2014-03-22 DIAGNOSIS — Z8701 Personal history of pneumonia (recurrent): Secondary | ICD-10-CM

## 2014-03-22 DIAGNOSIS — I2589 Other forms of chronic ischemic heart disease: Secondary | ICD-10-CM | POA: Diagnosis present

## 2014-03-22 DIAGNOSIS — I10 Essential (primary) hypertension: Secondary | ICD-10-CM | POA: Diagnosis present

## 2014-03-22 DIAGNOSIS — I959 Hypotension, unspecified: Secondary | ICD-10-CM | POA: Diagnosis present

## 2014-03-22 DIAGNOSIS — Z23 Encounter for immunization: Secondary | ICD-10-CM

## 2014-03-22 DIAGNOSIS — J449 Chronic obstructive pulmonary disease, unspecified: Secondary | ICD-10-CM | POA: Diagnosis present

## 2014-03-22 DIAGNOSIS — C7800 Secondary malignant neoplasm of unspecified lung: Secondary | ICD-10-CM | POA: Diagnosis present

## 2014-03-22 DIAGNOSIS — R310 Gross hematuria: Secondary | ICD-10-CM | POA: Diagnosis present

## 2014-03-22 DIAGNOSIS — R1011 Right upper quadrant pain: Secondary | ICD-10-CM

## 2014-03-22 DIAGNOSIS — Z8249 Family history of ischemic heart disease and other diseases of the circulatory system: Secondary | ICD-10-CM | POA: Diagnosis not present

## 2014-03-22 DIAGNOSIS — Z881 Allergy status to other antibiotic agents status: Secondary | ICD-10-CM

## 2014-03-22 DIAGNOSIS — I2510 Atherosclerotic heart disease of native coronary artery without angina pectoris: Secondary | ICD-10-CM | POA: Diagnosis present

## 2014-03-22 DIAGNOSIS — Z888 Allergy status to other drugs, medicaments and biological substances status: Secondary | ICD-10-CM

## 2014-03-22 DIAGNOSIS — Z01812 Encounter for preprocedural laboratory examination: Secondary | ICD-10-CM | POA: Diagnosis not present

## 2014-03-22 DIAGNOSIS — I252 Old myocardial infarction: Secondary | ICD-10-CM | POA: Diagnosis not present

## 2014-03-22 DIAGNOSIS — I480 Paroxysmal atrial fibrillation: Secondary | ICD-10-CM

## 2014-03-22 DIAGNOSIS — Z9981 Dependence on supplemental oxygen: Secondary | ICD-10-CM

## 2014-03-22 DIAGNOSIS — I482 Chronic atrial fibrillation, unspecified: Secondary | ICD-10-CM

## 2014-03-22 HISTORY — PX: ROBOT ASSISTED LAPAROSCOPIC NEPHRECTOMY: SHX5140

## 2014-03-22 HISTORY — DX: Peripheral vascular disease, unspecified: I73.9

## 2014-03-22 HISTORY — DX: Disorder of arteries and arterioles, unspecified: I77.9

## 2014-03-22 HISTORY — DX: Solitary pulmonary nodule: R91.1

## 2014-03-22 LAB — URINALYSIS, ROUTINE W REFLEX MICROSCOPIC
Bilirubin Urine: NEGATIVE
Glucose, UA: NEGATIVE mg/dL
Ketones, ur: NEGATIVE mg/dL
Leukocytes, UA: NEGATIVE
Nitrite: NEGATIVE
PROTEIN: 30 mg/dL — AB
Specific Gravity, Urine: 1.031 — ABNORMAL HIGH (ref 1.005–1.030)
UROBILINOGEN UA: 0.2 mg/dL (ref 0.0–1.0)
pH: 5.5 (ref 5.0–8.0)

## 2014-03-22 LAB — HEMOGLOBIN AND HEMATOCRIT, BLOOD
HEMATOCRIT: 35.5 % — AB (ref 36.0–46.0)
Hemoglobin: 11.3 g/dL — ABNORMAL LOW (ref 12.0–15.0)

## 2014-03-22 LAB — COMPREHENSIVE METABOLIC PANEL
ALK PHOS: 120 U/L — AB (ref 39–117)
ALT: 14 U/L (ref 0–35)
AST: 15 U/L (ref 0–37)
Albumin: 3 g/dL — ABNORMAL LOW (ref 3.5–5.2)
Anion gap: 14 (ref 5–15)
BILIRUBIN TOTAL: 0.3 mg/dL (ref 0.3–1.2)
BUN: 13 mg/dL (ref 6–23)
CO2: 23 meq/L (ref 19–32)
CREATININE: 0.83 mg/dL (ref 0.50–1.10)
Calcium: 9.5 mg/dL (ref 8.4–10.5)
Chloride: 101 mEq/L (ref 96–112)
GFR calc Af Amer: 86 mL/min — ABNORMAL LOW (ref >90)
GFR calc non Af Amer: 75 mL/min — ABNORMAL LOW (ref >90)
Glucose, Bld: 148 mg/dL — ABNORMAL HIGH (ref 70–99)
POTASSIUM: 4.2 meq/L (ref 3.7–5.3)
Sodium: 138 mEq/L (ref 137–147)
Total Protein: 7.5 g/dL (ref 6.0–8.3)

## 2014-03-22 LAB — TYPE AND SCREEN
ABO/RH(D): O POS
ANTIBODY SCREEN: NEGATIVE

## 2014-03-22 LAB — URINE MICROSCOPIC-ADD ON

## 2014-03-22 LAB — CBC WITH DIFFERENTIAL/PLATELET
BASOS ABS: 0 10*3/uL (ref 0.0–0.1)
BASOS PCT: 0 % (ref 0–1)
EOS PCT: 0 % (ref 0–5)
Eosinophils Absolute: 0 10*3/uL (ref 0.0–0.7)
HCT: 36.4 % (ref 36.0–46.0)
Hemoglobin: 11.5 g/dL — ABNORMAL LOW (ref 12.0–15.0)
Lymphocytes Relative: 4 % — ABNORMAL LOW (ref 12–46)
Lymphs Abs: 0.8 10*3/uL (ref 0.7–4.0)
MCH: 27.9 pg (ref 26.0–34.0)
MCHC: 31.6 g/dL (ref 30.0–36.0)
MCV: 88.3 fL (ref 78.0–100.0)
MONO ABS: 0.2 10*3/uL (ref 0.1–1.0)
Monocytes Relative: 1 % — ABNORMAL LOW (ref 3–12)
Neutro Abs: 18.1 10*3/uL — ABNORMAL HIGH (ref 1.7–7.7)
Neutrophils Relative %: 95 % — ABNORMAL HIGH (ref 43–77)
Platelets: 406 10*3/uL — ABNORMAL HIGH (ref 150–400)
RBC: 4.12 MIL/uL (ref 3.87–5.11)
RDW: 14.7 % (ref 11.5–15.5)
WBC: 19.2 10*3/uL — AB (ref 4.0–10.5)

## 2014-03-22 LAB — GLUCOSE, CAPILLARY: Glucose-Capillary: 176 mg/dL — ABNORMAL HIGH (ref 70–99)

## 2014-03-22 LAB — TROPONIN I

## 2014-03-22 LAB — MAGNESIUM: MAGNESIUM: 2.1 mg/dL (ref 1.5–2.5)

## 2014-03-22 LAB — PHOSPHORUS: PHOSPHORUS: 4.4 mg/dL (ref 2.3–4.6)

## 2014-03-22 LAB — TSH: TSH: 9.36 u[IU]/mL — ABNORMAL HIGH (ref 0.350–4.500)

## 2014-03-22 SURGERY — ROBOTIC ASSISTED LAPAROSCOPIC NEPHRECTOMY
Anesthesia: General | Site: Abdomen | Laterality: Left

## 2014-03-22 MED ORDER — ALBUTEROL SULFATE HFA 108 (90 BASE) MCG/ACT IN AERS: 2.0000 | INHALATION_SPRAY | Freq: Four times a day (QID) | RESPIRATORY_TRACT | Status: DC | PRN

## 2014-03-22 MED ORDER — GLYCOPYRROLATE 0.2 MG/ML IJ SOLN
INTRAMUSCULAR | Status: DC | PRN
  Administered 2014-03-22: 0.6 mg via INTRAVENOUS

## 2014-03-22 MED ORDER — ONDANSETRON HCL 4 MG/2ML IJ SOLN
INTRAMUSCULAR | Status: DC | PRN
  Administered 2014-03-22: 4 mg via INTRAVENOUS

## 2014-03-22 MED ORDER — PNEUMOCOCCAL VAC POLYVALENT 25 MCG/0.5ML IJ INJ
0.5000 mL | INJECTION | INTRAMUSCULAR | Status: AC
  Administered 2014-03-23: 0.5 mL via INTRAMUSCULAR
  Filled 2014-03-22 (×2): qty 0.5

## 2014-03-22 MED ORDER — ESMOLOL HCL 10 MG/ML IV SOLN
INTRAVENOUS | Status: DC | PRN
  Administered 2014-03-22 (×3): 20 mg via INTRAVENOUS

## 2014-03-22 MED ORDER — LACTATED RINGERS IV SOLN
INTRAVENOUS | Status: DC
  Administered 2014-03-22: 15:00:00 via INTRAVENOUS
  Administered 2014-03-22: 1000 mL via INTRAVENOUS

## 2014-03-22 MED ORDER — PANTOPRAZOLE SODIUM 40 MG IV SOLR
40.0000 mg | INTRAVENOUS | Status: DC
  Administered 2014-03-22 – 2014-03-23 (×2): 40 mg via INTRAVENOUS
  Filled 2014-03-22 (×2): qty 40

## 2014-03-22 MED ORDER — METOCLOPRAMIDE HCL 5 MG/ML IJ SOLN
INTRAMUSCULAR | Status: DC | PRN
  Administered 2014-03-22: 10 mg via INTRAVENOUS

## 2014-03-22 MED ORDER — PHENYLEPHRINE HCL 10 MG/ML IJ SOLN
10.0000 mg | INTRAVENOUS | Status: DC | PRN
  Administered 2014-03-22: 40 ug/min via INTRAVENOUS

## 2014-03-22 MED ORDER — INSULIN ASPART 100 UNIT/ML ~~LOC~~ SOLN
0.0000 [IU] | SUBCUTANEOUS | Status: DC
  Administered 2014-03-22 – 2014-03-23 (×2): 2 [IU] via SUBCUTANEOUS
  Administered 2014-03-23 (×3): 1 [IU] via SUBCUTANEOUS
  Administered 2014-03-23: 2 [IU] via SUBCUTANEOUS

## 2014-03-22 MED ORDER — SODIUM CHLORIDE 0.9 % IV BOLUS (SEPSIS)
500.0000 mL | Freq: Once | INTRAVENOUS | Status: AC
  Administered 2014-03-22: 500 mL via INTRAVENOUS

## 2014-03-22 MED ORDER — CISATRACURIUM BESYLATE 20 MG/10ML IV SOLN
INTRAVENOUS | Status: AC
  Filled 2014-03-22: qty 10

## 2014-03-22 MED ORDER — SUCCINYLCHOLINE CHLORIDE 20 MG/ML IJ SOLN
INTRAMUSCULAR | Status: DC | PRN
  Administered 2014-03-22: 100 mg via INTRAVENOUS

## 2014-03-22 MED ORDER — IPRATROPIUM-ALBUTEROL 0.5-2.5 (3) MG/3ML IN SOLN
3.0000 mL | Freq: Four times a day (QID) | RESPIRATORY_TRACT | Status: DC
  Filled 2014-03-22 (×6): qty 3

## 2014-03-22 MED ORDER — LIDOCAINE HCL (CARDIAC) 20 MG/ML IV SOLN
INTRAVENOUS | Status: AC
  Filled 2014-03-22: qty 5

## 2014-03-22 MED ORDER — ALBUTEROL SULFATE (5 MG/ML) 0.5% IN NEBU: 2.5000 mg | INHALATION_SOLUTION | Freq: Four times a day (QID) | RESPIRATORY_TRACT | Status: DC

## 2014-03-22 MED ORDER — FENTANYL CITRATE 0.05 MG/ML IJ SOLN
INTRAMUSCULAR | Status: AC
  Filled 2014-03-22: qty 5

## 2014-03-22 MED ORDER — CIPROFLOXACIN IN D5W 400 MG/200ML IV SOLN
INTRAVENOUS | Status: AC
  Filled 2014-03-22: qty 200

## 2014-03-22 MED ORDER — DEXTROSE-NACL 5-0.45 % IV SOLN
INTRAVENOUS | Status: DC
  Administered 2014-03-22 – 2014-03-24 (×4): via INTRAVENOUS

## 2014-03-22 MED ORDER — MOMETASONE FURO-FORMOTEROL FUM 200-5 MCG/ACT IN AERO: 2.0000 | INHALATION_SPRAY | Freq: Two times a day (BID) | RESPIRATORY_TRACT | Status: DC

## 2014-03-22 MED ORDER — METOPROLOL TARTRATE 25 MG PO TABS: 75.0000 mg | ORAL_TABLET | Freq: Two times a day (BID) | ORAL | Status: DC

## 2014-03-22 MED ORDER — HYDROMORPHONE HCL 1 MG/ML IJ SOLN
0.2500 mg | INTRAMUSCULAR | Status: DC | PRN
  Administered 2014-03-22: 0.5 mg via INTRAVENOUS

## 2014-03-22 MED ORDER — PROPOFOL 10 MG/ML IV BOLUS
INTRAVENOUS | Status: AC
  Filled 2014-03-22: qty 20

## 2014-03-22 MED ORDER — ACETAMINOPHEN 10 MG/ML IV SOLN
1000.0000 mg | Freq: Once | INTRAVENOUS | Status: AC
  Administered 2014-03-22: 1000 mg via INTRAVENOUS
  Filled 2014-03-22: qty 100

## 2014-03-22 MED ORDER — METOPROLOL TARTRATE 1 MG/ML IV SOLN
5.0000 mg | Freq: Four times a day (QID) | INTRAVENOUS | Status: DC
  Administered 2014-03-22 – 2014-03-24 (×7): 5 mg via INTRAVENOUS
  Filled 2014-03-22 (×7): qty 5

## 2014-03-22 MED ORDER — HYDROMORPHONE HCL 1 MG/ML IJ SOLN
INTRAMUSCULAR | Status: AC
  Filled 2014-03-22: qty 1

## 2014-03-22 MED ORDER — LEVALBUTEROL HCL 1.25 MG/0.5ML IN NEBU
1.2500 mg | INHALATION_SOLUTION | Freq: Four times a day (QID) | RESPIRATORY_TRACT | Status: DC
  Administered 2014-03-22 – 2014-03-24 (×8): 1.25 mg via RESPIRATORY_TRACT
  Filled 2014-03-22 (×8): qty 0.5

## 2014-03-22 MED ORDER — DEXAMETHASONE SODIUM PHOSPHATE 10 MG/ML IJ SOLN
INTRAMUSCULAR | Status: AC
  Filled 2014-03-22: qty 1

## 2014-03-22 MED ORDER — CLINDAMYCIN PHOSPHATE 900 MG/50ML IV SOLN
900.0000 mg | Freq: Once | INTRAVENOUS | Status: AC
  Administered 2014-03-22: 900 mg via INTRAVENOUS

## 2014-03-22 MED ORDER — LEVALBUTEROL TARTRATE 45 MCG/ACT IN AERO: 6.0000 | INHALATION_SPRAY | Freq: Four times a day (QID) | RESPIRATORY_TRACT | Status: DC

## 2014-03-22 MED ORDER — DILTIAZEM HCL 25 MG/5ML IV SOLN
10.0000 mg | Freq: Once | INTRAVENOUS | Status: AC
  Administered 2014-03-22: 10 mg via INTRAVENOUS

## 2014-03-22 MED ORDER — PROMETHAZINE HCL 25 MG/ML IJ SOLN: 6.2500 mg | INTRAMUSCULAR | Status: DC | PRN

## 2014-03-22 MED ORDER — HYDROCODONE-ACETAMINOPHEN 5-325 MG PO TABS: 1.0000 | ORAL_TABLET | Freq: Four times a day (QID) | ORAL | Status: DC | PRN

## 2014-03-22 MED ORDER — NEOSTIGMINE METHYLSULFATE 10 MG/10ML IV SOLN
INTRAVENOUS | Status: DC | PRN
  Administered 2014-03-22: 4 mg via INTRAVENOUS

## 2014-03-22 MED ORDER — BUDESONIDE 0.25 MG/2ML IN SUSP
0.2500 mg | Freq: Four times a day (QID) | RESPIRATORY_TRACT | Status: DC
  Administered 2014-03-22 – 2014-03-24 (×8): 0.25 mg via RESPIRATORY_TRACT
  Filled 2014-03-22 (×9): qty 2

## 2014-03-22 MED ORDER — FENTANYL CITRATE 0.05 MG/ML IJ SOLN
INTRAMUSCULAR | Status: AC
  Filled 2014-03-22: qty 2

## 2014-03-22 MED ORDER — INFLUENZA VAC SPLIT QUAD 0.5 ML IM SUSY
0.5000 mL | PREFILLED_SYRINGE | INTRAMUSCULAR | Status: AC
  Administered 2014-03-23: 0.5 mL via INTRAMUSCULAR
  Filled 2014-03-22 (×2): qty 0.5

## 2014-03-22 MED ORDER — LEVALBUTEROL HCL 0.63 MG/3ML IN NEBU: 0.6300 mg | INHALATION_SOLUTION | Freq: Four times a day (QID) | RESPIRATORY_TRACT | Status: DC | PRN

## 2014-03-22 MED ORDER — GLYCOPYRROLATE 0.2 MG/ML IJ SOLN
INTRAMUSCULAR | Status: AC
  Filled 2014-03-22: qty 3

## 2014-03-22 MED ORDER — SODIUM CHLORIDE 0.9 % IJ SOLN
INTRAMUSCULAR | Status: AC
  Filled 2014-03-22: qty 3

## 2014-03-22 MED ORDER — PROPOFOL 10 MG/ML IV BOLUS
INTRAVENOUS | Status: DC | PRN
  Administered 2014-03-22: 130 mg via INTRAVENOUS

## 2014-03-22 MED ORDER — DILTIAZEM HCL 25 MG/5ML IV SOLN
5.0000 mg | Freq: Once | INTRAVENOUS | Status: AC
  Administered 2014-03-22: 5 mg via INTRAVENOUS

## 2014-03-22 MED ORDER — TIOTROPIUM BROMIDE MONOHYDRATE 18 MCG IN CAPS: 18.0000 ug | ORAL_CAPSULE | Freq: Every morning | RESPIRATORY_TRACT | Status: DC

## 2014-03-22 MED ORDER — STERILE WATER FOR IRRIGATION IR SOLN
Status: DC | PRN
  Administered 2014-03-22: 1500 mL

## 2014-03-22 MED ORDER — ONDANSETRON HCL 4 MG/2ML IJ SOLN
INTRAMUSCULAR | Status: AC
  Filled 2014-03-22: qty 2

## 2014-03-22 MED ORDER — ONDANSETRON HCL 4 MG/2ML IJ SOLN: 4.0000 mg | INTRAMUSCULAR | Status: DC | PRN

## 2014-03-22 MED ORDER — BUPIVACAINE LIPOSOME 1.3 % IJ SUSP
20.0000 mL | Freq: Once | INTRAMUSCULAR | Status: AC
  Administered 2014-03-22: 20 mL
  Filled 2014-03-22: qty 20

## 2014-03-22 MED ORDER — LACTATED RINGERS IR SOLN
Status: DC | PRN
  Administered 2014-03-22: 1

## 2014-03-22 MED ORDER — DILTIAZEM HCL 25 MG/5ML IV SOLN
INTRAVENOUS | Status: AC
  Filled 2014-03-22: qty 5

## 2014-03-22 MED ORDER — SODIUM CHLORIDE 0.9 % IV SOLN: INTRAVENOUS | Status: DC

## 2014-03-22 MED ORDER — CHLORHEXIDINE GLUCONATE 0.12 % MT SOLN
15.0000 mL | Freq: Two times a day (BID) | OROMUCOSAL | Status: DC
  Administered 2014-03-23 (×2): 15 mL via OROMUCOSAL
  Filled 2014-03-22 (×2): qty 15

## 2014-03-22 MED ORDER — SODIUM CHLORIDE 0.9 % IJ SOLN
INTRAMUSCULAR | Status: DC | PRN
  Administered 2014-03-22: 20 mL via INTRAVENOUS

## 2014-03-22 MED ORDER — CETYLPYRIDINIUM CHLORIDE 0.05 % MT LIQD
7.0000 mL | Freq: Two times a day (BID) | OROMUCOSAL | Status: DC
  Administered 2014-03-23: 7 mL via OROMUCOSAL

## 2014-03-22 MED ORDER — CISATRACURIUM BESYLATE (PF) 10 MG/5ML IV SOLN
INTRAVENOUS | Status: DC | PRN
  Administered 2014-03-22: 1 mg via INTRAVENOUS
  Administered 2014-03-22: 8 mg via INTRAVENOUS
  Administered 2014-03-22 (×2): 2 mg via INTRAVENOUS

## 2014-03-22 MED ORDER — DEXAMETHASONE SODIUM PHOSPHATE 10 MG/ML IJ SOLN
INTRAMUSCULAR | Status: DC | PRN
  Administered 2014-03-22: 10 mg via INTRAVENOUS

## 2014-03-22 MED ORDER — ESCITALOPRAM OXALATE 20 MG PO TABS
10.0000 mg | ORAL_TABLET | Freq: Every day | ORAL | Status: DC
  Administered 2014-03-22 – 2014-03-23 (×2): 10 mg via ORAL
  Filled 2014-03-22 (×2): qty 1

## 2014-03-22 MED ORDER — ACETAMINOPHEN 500 MG PO TABS
1000.0000 mg | ORAL_TABLET | Freq: Four times a day (QID) | ORAL | Status: AC
  Administered 2014-03-22 – 2014-03-23 (×4): 1000 mg via ORAL
  Filled 2014-03-22 (×4): qty 2

## 2014-03-22 MED ORDER — FENTANYL CITRATE 0.05 MG/ML IJ SOLN
INTRAMUSCULAR | Status: DC | PRN
  Administered 2014-03-22: 25 ug via INTRAVENOUS
  Administered 2014-03-22 (×5): 50 ug via INTRAVENOUS
  Administered 2014-03-22: 25 ug via INTRAVENOUS
  Administered 2014-03-22 (×3): 50 ug via INTRAVENOUS

## 2014-03-22 MED ORDER — NEOSTIGMINE METHYLSULFATE 10 MG/10ML IV SOLN
INTRAVENOUS | Status: AC
  Filled 2014-03-22: qty 1

## 2014-03-22 MED ORDER — LIDOCAINE HCL (CARDIAC) 20 MG/ML IV SOLN
INTRAVENOUS | Status: DC | PRN
  Administered 2014-03-22: 80 mg via INTRAVENOUS

## 2014-03-22 MED ORDER — SIMVASTATIN 10 MG PO TABS
20.0000 mg | ORAL_TABLET | Freq: Every evening | ORAL | Status: DC
  Administered 2014-03-22 – 2014-03-24 (×3): 20 mg via ORAL
  Filled 2014-03-22 (×4): qty 2

## 2014-03-22 MED ORDER — MIDAZOLAM HCL 5 MG/5ML IJ SOLN
INTRAMUSCULAR | Status: DC | PRN
  Administered 2014-03-22: 2 mg via INTRAVENOUS

## 2014-03-22 MED ORDER — SODIUM CHLORIDE 0.9 % IJ SOLN
INTRAMUSCULAR | Status: AC
  Filled 2014-03-22: qty 20

## 2014-03-22 MED ORDER — HYDROMORPHONE HCL 1 MG/ML IJ SOLN
0.5000 mg | INTRAMUSCULAR | Status: DC | PRN
  Administered 2014-03-22 – 2014-03-23 (×5): 1 mg via INTRAVENOUS
  Filled 2014-03-22 (×5): qty 1

## 2014-03-22 MED ORDER — ALBUTEROL SULFATE (2.5 MG/3ML) 0.083% IN NEBU: 2.5000 mg | INHALATION_SOLUTION | RESPIRATORY_TRACT | Status: DC | PRN

## 2014-03-22 MED ORDER — CIPROFLOXACIN IN D5W 400 MG/200ML IV SOLN
400.0000 mg | Freq: Two times a day (BID) | INTRAVENOUS | Status: DC
  Administered 2014-03-22: 400 mg via INTRAVENOUS

## 2014-03-22 MED ORDER — CLINDAMYCIN PHOSPHATE 900 MG/50ML IV SOLN
INTRAVENOUS | Status: AC
  Filled 2014-03-22: qty 50

## 2014-03-22 MED ORDER — MIDAZOLAM HCL 2 MG/2ML IJ SOLN
INTRAMUSCULAR | Status: AC
  Filled 2014-03-22: qty 2

## 2014-03-22 MED ORDER — METOPROLOL TARTRATE 25 MG PO TABS
25.0000 mg | ORAL_TABLET | Freq: Two times a day (BID) | ORAL | Status: DC
  Administered 2014-03-22 – 2014-03-23 (×2): 25 mg via ORAL
  Filled 2014-03-22 (×2): qty 1

## 2014-03-22 MED ORDER — OXYCODONE HCL 5 MG PO TABS
5.0000 mg | ORAL_TABLET | ORAL | Status: DC | PRN
  Administered 2014-03-23 – 2014-03-24 (×4): 5 mg via ORAL
  Filled 2014-03-22 (×4): qty 1

## 2014-03-22 MED ORDER — TIOTROPIUM BROMIDE MONOHYDRATE 18 MCG IN CAPS
18.0000 ug | ORAL_CAPSULE | Freq: Every morning | RESPIRATORY_TRACT | Status: DC
  Administered 2014-03-23 – 2014-03-24 (×2): 18 ug via RESPIRATORY_TRACT
  Filled 2014-03-22: qty 5

## 2014-03-22 MED ORDER — METOCLOPRAMIDE HCL 5 MG/ML IJ SOLN
INTRAMUSCULAR | Status: AC
  Filled 2014-03-22: qty 2

## 2014-03-22 SURGICAL SUPPLY — 56 items
CHLORAPREP W/TINT 26ML (MISCELLANEOUS) ×3 IMPLANT
CLIP LIGATING HEM O LOK PURPLE (MISCELLANEOUS) ×3 IMPLANT
CLIP LIGATING HEMO LOK XL GOLD (MISCELLANEOUS) IMPLANT
CLIP LIGATING HEMO O LOK GREEN (MISCELLANEOUS) ×3 IMPLANT
CORDS BIPOLAR (ELECTRODE) IMPLANT
COVER SURGICAL LIGHT HANDLE (MISCELLANEOUS) ×3 IMPLANT
COVER TIP SHEARS 8 DVNC (MISCELLANEOUS) ×1 IMPLANT
COVER TIP SHEARS 8MM DA VINCI (MISCELLANEOUS) ×2
CUTTER ECHEON FLEX ENDO 45 340 (ENDOMECHANICALS) ×3 IMPLANT
DECANTER SPIKE VIAL GLASS SM (MISCELLANEOUS) ×3 IMPLANT
DERMABOND ADVANCED (GAUZE/BANDAGES/DRESSINGS) ×4
DERMABOND ADVANCED .7 DNX12 (GAUZE/BANDAGES/DRESSINGS) ×2 IMPLANT
DRAIN CHANNEL 15F RND FF 3/16 (WOUND CARE) IMPLANT
DRAPE ARM DVNC X/XI (DISPOSABLE) ×4 IMPLANT
DRAPE COLUMN DVNC XI (DISPOSABLE) ×1 IMPLANT
DRAPE DA VINCI XI ARM (DISPOSABLE) ×8
DRAPE DA VINCI XI COLUMN (DISPOSABLE) ×2
DRAPE INCISE IOBAN 66X45 STRL (DRAPES) ×3 IMPLANT
DRAPE LAPAROSCOPIC ABDOMINAL (DRAPES) ×3 IMPLANT
DRAPE LG THREE QUARTER DISP (DRAPES) ×6 IMPLANT
DRAPE TABLE BACK 44X90 PK DISP (DRAPES) ×3 IMPLANT
DRAPE WARM FLUID 44X44 (DRAPE) IMPLANT
ELECT REM PT RETURN 9FT ADLT (ELECTROSURGICAL) ×3
ELECTRODE REM PT RTRN 9FT ADLT (ELECTROSURGICAL) ×1 IMPLANT
EVACUATOR SILICONE 100CC (DRAIN) IMPLANT
GLOVE BIOGEL M STRL SZ7.5 (GLOVE) ×6 IMPLANT
GOWN STRL REUS W/TWL LRG LVL3 (GOWN DISPOSABLE) ×6 IMPLANT
KIT ACCESSORY DA VINCI DISP (KITS)
KIT ACCESSORY DVNC DISP (KITS) IMPLANT
KIT BASIN OR (CUSTOM PROCEDURE TRAY) ×3 IMPLANT
MARKER SKIN DUAL TIP RULER LAB (MISCELLANEOUS) ×3 IMPLANT
NEEDLE INSUFFLATION 14GA 120MM (NEEDLE) ×3 IMPLANT
PENCIL BUTTON HOLSTER BLD 10FT (ELECTRODE) ×3 IMPLANT
POSITIONER SURGICAL ARM (MISCELLANEOUS) ×6 IMPLANT
POUCH ENDO CATCH II 15MM (MISCELLANEOUS) ×3 IMPLANT
RELOAD WH ECHELON 45 (STAPLE) ×12 IMPLANT
SEAL CANN UNIV 5-8 DVNC XI (MISCELLANEOUS) ×4 IMPLANT
SEAL XI 5MM-8MM UNIVERSAL (MISCELLANEOUS) ×8
SET TUBE IRRIG SUCTION NO TIP (IRRIGATION / IRRIGATOR) ×3 IMPLANT
SOLUTION ANTI FOG 6CC (MISCELLANEOUS) ×3 IMPLANT
SOLUTION ELECTROLUBE (MISCELLANEOUS) ×3 IMPLANT
SPONGE LAP 18X18 X RAY DECT (DISPOSABLE) IMPLANT
SPONGE LAP 4X18 X RAY DECT (DISPOSABLE) ×3 IMPLANT
SUT ETHILON 3 0 PS 1 (SUTURE) IMPLANT
SUT MNCRL AB 4-0 PS2 18 (SUTURE) ×6 IMPLANT
SUT PDS AB 1 CT1 27 (SUTURE) ×6 IMPLANT
SUT VICRYL 0 UR6 27IN ABS (SUTURE) ×6 IMPLANT
SYR BULB IRRIGATION 50ML (SYRINGE) IMPLANT
TOWEL OR NON WOVEN STRL DISP B (DISPOSABLE) ×3 IMPLANT
TRAY FOLEY CATH 14FRSI W/METER (CATHETERS) ×3 IMPLANT
TRAY LAP CHOLE (CUSTOM PROCEDURE TRAY) ×3 IMPLANT
TROCAR 12M 150ML BLUNT (TROCAR) ×3 IMPLANT
TROCAR UNIVERSAL OPT 12M 100M (ENDOMECHANICALS) ×3 IMPLANT
TROCAR XCEL 12X100 BLDLESS (ENDOMECHANICALS) ×3 IMPLANT
TUBING INSUFFLATION 10FT LAP (TUBING) ×3 IMPLANT
WATER STERILE IRR 1500ML POUR (IV SOLUTION) ×3 IMPLANT

## 2014-03-22 NOTE — Progress Notes (Signed)
PACU note---pt's heart rate 150's-160's, Dr. Kalman Shan aware, order rec'd and med given

## 2014-03-22 NOTE — Progress Notes (Signed)
PACU note----additional med given per order Dr. Kalman Shan

## 2014-03-22 NOTE — H&P (Signed)
Allison [name] is an 63 y.o. female.    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 No locally advanced disease / bulky nodes. Chest CT with Rt>Lt small scatterd nodules. Bone scan negative. Recent Cr <1.0.   2 - Gross Hematuria - about 7 mos of on/ off gross hematuria, remote 50PY smoker now quit. CT with large left mass as per above. Cysto 02/2014 w/o lower tract lesions.    PMH sig for CAD/Stent/Plavix, AFib (follows [facility], cards), COPD (follows [facility], pulm), PAD/ fem-pop (follows [facility]),   Today Allison [name] is seen to proceed with left radical nephrectomy. She has had extensive cardiopulmonary pre-op evaluation and is certainly a risky candidate even with extensive medical optimization. She has very good understanding of this and realistic expectations.   Past Medical History  Diagnosis Date  . COPD (chronic obstructive pulmonary disease)   . Tobacco abuse     hx of  . Coronary artery disease     PCI, DES circumflex 2/11. moderate disease LAD  . Hyperlipidemia   . Ischemic cardiomyopathy     Echo 08/14/09 with LVEF 40-45%.   . A-fib   . 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 Acute pyelonephritis 01/28/2014  . Ischemic cardiomyopathy     improved EF of 55-60% (01/28/14)  . Hypertension     hx of- none now  . Dysrhythmia     a fib  . PVD (peripheral vascular disease)     lower extremity runoff 05/08/10  . Pneumonia     hx of  . Myocardial infarction 2011  . Anxiety   . History of kidney stones   . Shortness of breath     with activity    Past Surgical History  Procedure Laterality Date  . Coronary stent placement  2011  . Femoral bypass Left May 2012  . Lower extremity angiogram  December 16, 2011  . Cardiac catheterization    . Tonsillectomy  63 years old    Family  History  Problem Relation Age of Onset  . Coronary artery disease Mother     deceased. coronary artery bypass graft  . Cancer Mother     BREAST  . Heart disease Mother     PVD  - Amputation-Leg  . Hyperlipidemia Mother   . Hypertension Mother   . Heart attack Mother   . Cirrhosis Father     deceased  . Cancer Father   . Other Brother     alive, unknown hx   Social History:  reports that she quit smoking about 4 years ago. Her smoking use included Cigarettes. She has a 45 pack-year smoking history. She has never used smokeless tobacco. She reports that she does not drink alcohol or use illicit drugs.  Allergies:  Allergies  Allergen Reactions  . Ace Inhibitors Cough  . Amoxicillin-Pot Clavulanate Hives  . Codeine Nausea And Vomiting    No prescriptions prior to admission    No results found for this or any previous visit (from the past 48 hour(s)). No results found.  Review of Systems  Constitutional: Negative.  Negative for fever and chills.  HENT: Negative.   Eyes: Negative.   Respiratory: Positive for shortness of breath and wheezing.   Cardiovascular: Negative.  Negative for chest pain.  Gastrointestinal: Negative.  Negative for nausea and vomiting.  Genitourinary: Negative.   Musculoskeletal: Negative.   Skin: Negative.   Neurological: Negative.   Endo/Heme/Allergies: Negative.   Psychiatric/Behavioral: Negative.     There were no vitals taken for this visit. Physical Exam  Constitutional: She appears well-developed and well-nourished.  Thin, pleasant, stigmata of COPD  HENT:  Head: Normocephalic.  Neck: Normal range of motion.  Cardiovascular: Normal rate.   Respiratory: Effort normal. No respiratory distress.  GI: Soft. Bowel sounds are normal.  Genitourinary:  No CVAT  Musculoskeletal: Normal range of motion.  Neurological: She is alert.  Skin: Skin is warm and dry.  Psychiatric: She has a normal mood and affect. Her behavior is normal. Judgment  and thought content normal.     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 She is not ideal operative candidate with both significant heart and lung problems, but this appears to be stable and she has tolerated prior surgeries in past few years w/o issue.   As per prior discussion with her pulmonology team, will plan for step-down admission post-op with PCCM consultation.   We rediscussed the role of radical nephrectomy with the overall goal of complete surgical excision (negative margins) and better staging / diagnosis. We specifically rediscussed that with removal of the kidney there would be an overall renal function decline with attendant risks of renal failure and need for dialysis in some cases, and need for kidney-friendly lifestyle post-op with excellent blood pressure and glycemic control. We then rediscussed surgical approaches including robotic and open techniques with robotic associated with a shorter convalescence. I showed the patient on their abdomen the approximately 4-6 incision (trocar) sites as well as presumed extraction sites with robotic approach as well as possible open incision sites. We specifically readdressed that there may be need to alter operative plans according to intraopertive findings including conversion to open procedure. We rediscussed specific peri-operative risks including bleeding, infection, deep vein thrombosis, pulmonary embolism, compartment syndrome, nuropathy / neuropraxia, heart attack, stroke, death, as well as long-term risks such as non-cure / need for additional therapy and need for imaging and lab based post-op surveillance protocols. We rediscussed typical hospital course of approximately 2 day hospitalization, need for peri-operative drains / catheters, and typical post-hospital course with return to most  non-strenuous activities by 2 weeks and ability to return to most jobs and more strenuous activity such as exercise by 6 weeks.    After this lengthy and detail discussion, including answering all of the patient's questions to their satisfaction, they have chosen to proceed today as planned  2 - Gross Hematuria -  likely form left mass. cysto confirms no bladder lesions.   Jorgeluis Gurganus 03/22/2014, 6:06 AM

## 2014-03-22 NOTE — Progress Notes (Signed)
PACU note----pt's heart rate 120's on arrival to PACU; Dr. Kalman Shan made aware, meds given by Miguel Rota, CRNA

## 2014-03-22 NOTE — Progress Notes (Signed)
Patient developed RVR in PACU (in afib preop). Pulse to 160's Diltiazem bolus x 2, cardiology consulted.   Pulse now in 110's-120's, no complaints of CP

## 2014-03-22 NOTE — Anesthesia Postprocedure Evaluation (Signed)
  Anesthesia Post-op Note  Patient: Allison [name]  Procedure(s) Performed: Procedure(s) (LRB): ROBOTIC ASSISTED LAPAROSCOPIC RADICAL NEPHRECTOMY LEFT (Left)  Patient Location: PACU  Anesthesia Type: General  Level of Consciousness: awake and alert   Airway and Oxygen Therapy: Patient Spontanous Breathing  Post-op Pain: mild  Post-op Assessment: Post-op Vital signs reviewed, Patient's Cardiovascular Status Stable, Respiratory Function Stable, Patent Airway and No signs of Nausea or vomiting  Last Vitals:  Filed Vitals:   03/22/14 1900  BP:   Pulse: 100  Temp:   Resp: 14    Post-op Vital Signs: stable   Complications: No apparent anesthesia complications

## 2014-03-22 NOTE — Anesthesia Preprocedure Evaluation (Addendum)
Anesthesia Evaluation  Patient identified by MRN, date of birth, ID band Patient awake    Reviewed: Allergy & Precautions, H&P , NPO status , Patient's Chart, lab work & pertinent test results  Airway Mallampati: II TM Distance: >3 FB Neck ROM: Full    Dental no notable dental hx.    Pulmonary COPDformer smoker,  IMPRESSION: 1. At least 3 pulmonary nodules, the largest in the deep right lower lobe and the inferior left upper lobe, measured above. This is consistent with metastatic disease. 2. No evidence of metastatic lymphadenopathy in the thorax. 3. COPD/emphysema. Diffuse interstitial pulmonary opacities likely represent mild edema. 4. Mild cardiomegaly. Moderate to severe LAD and left circumflex coronary atherosclerosis. 5. No evidence of osseous metastatic disease.  breath sounds clear to auscultation  + decreased breath sounds      Cardiovascular hypertension, + CAD, + Past MI and + Peripheral Vascular Disease + dysrhythmias Atrial Fibrillation Rhythm:Regular Rate:Normal  improved EF of 55-60% (01/28/14   Neuro/Psych negative neurological ROS  negative psych ROS   GI/Hepatic negative GI ROS, Neg liver ROS,   Endo/Other  negative endocrine ROS  Renal/GU negative Renal ROS  negative genitourinary   Musculoskeletal negative musculoskeletal ROS (+)   Abdominal   Peds negative pediatric ROS (+)  Hematology negative hematology ROS (+)   Anesthesia Other Findings   Reproductive/Obstetrics negative OB ROS                          Anesthesia Physical Anesthesia Plan  ASA: IV  Anesthesia Plan: General   Post-op Pain Management:    Induction: Intravenous  Airway Management Planned: Oral ETT  Additional Equipment:   Intra-op Plan:   Post-operative Plan: Possible Post-op intubation/ventilation  Informed Consent: I have reviewed the patients History and Physical, chart, labs and  discussed the procedure including the risks, benefits and alternatives for the proposed anesthesia with the patient or authorized representative who has indicated his/her understanding and acceptance.   Dental advisory given  Plan Discussed with: CRNA and Surgeon  Anesthesia Plan Comments:         Anesthesia Quick Evaluation

## 2014-03-22 NOTE — Progress Notes (Signed)
   Post op ICU - eMD eval  Looks well  RN says alert and oriented  EKG with A FIb Currently vitals stable  A COPD - continue nebs, No need fo abg SVT - check mag and phos to 16.20 sample    Dr. Brand Males, M.D., [facility].C.P Pulmonary and Critical Care Medicine Staff Physician [place] [facility] Pager: [phone], If no answer or between  15:00h - 7:00h: call [phone]  03/22/2014 9:45 PM

## 2014-03-22 NOTE — Brief Op Note (Signed)
03/22/2014  5:04 PM  PATIENT:  Allison [name]  63 y.o. female  PRE-OPERATIVE DIAGNOSIS:  LEFT RENAL MASS  POST-OPERATIVE DIAGNOSIS:  LEFT RENAL MASS  PROCEDURE:  Procedure(s): ROBOTIC ASSISTED LAPAROSCOPIC RADICAL NEPHRECTOMY LEFT (Left)  SURGEON:  Surgeon(s) and Role:    * Alexis Frock, MD - Primary  PHYSICIAN ASSISTANT:   ASSISTANTS: Debbrah Alar, PA   ANESTHESIA:   local and general  EBL:  Total I/O In: 1350 [I.V.:1350] Out: 175 [Urine:150; Blood:25]  BLOOD ADMINISTERED:none  DRAINS: foley to straight drain   LOCAL MEDICATIONS USED:  MARCAINE     SPECIMEN:  Source of Specimen:  Left Kidney  DISPOSITION OF SPECIMEN:  PATHOLOGY  COUNTS:  YES  TOURNIQUET:  * No tourniquets in log *  DICTATION: .Other Dictation: Dictation Number  [id number]  PLAN OF CARE: Admit to inpatient   PATIENT DISPOSITION:  PACU - hemodynamically stable.   Delay start of Pharmacological VTE agent (>24hrs) due to surgical blood loss or risk of bleeding: yes

## 2014-03-22 NOTE — Discharge Instructions (Signed)
1.  Activity:  You are encouraged to ambulate frequently (about every hour during waking hours) to help prevent blood clots from forming in your legs or lungs.  However, you should not engage in any heavy lifting (> 10-15 lbs), strenuous activity, or straining. 2. Diet: You should advance your diet as instructed by your physician.  It will be normal to have some bloating, nausea, and abdominal discomfort intermittently. 3. Prescriptions:  You will be provided a prescription for pain medication to take as needed.  If your pain is not severe enough to require the prescription pain medication, you may take extra strength Tylenol instead which will have less side effects.  You should also take a prescribed stool softener to avoid straining with bowel movements as the prescription pain medication may constipate you. 4. Incisions: You may remove your dressing bandages 48 hours after surgery if not removed in the hospital.  You will either have some small staples or special tissue glue at each of the incision sites. Once the bandages are removed (if present), the incisions may stay open to air.  You may start showering (but not soaking or bathing in water) the 2nd day after surgery and the incisions simply need to be patted dry after the shower.  No additional care is needed. 5. What to call us about: You should call the office [phone]) if you develop fever > 101 or develop persistent vomiting.  You may resume Plavix 7 days after surgery.

## 2014-03-22 NOTE — Consult Note (Signed)
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  PCCM consulted post op for COPD mgmt, afib rvr, pulmonary management.  SIGNIFICANT EVENTS / STUDIES:  9/16 L radical nephrectomy   LINES / TUBES: piv  CULTURES:   ANTIBIOTICS: Peri-op  HISTORY OF PRESENT ILLNESS:  63yo female remote smoker with hx COPD, AFib, CAD admitted 9/16 for elective L radical nephrectomy in setting renal cell carcinoma.  Was seen for pre-op clearance by Dr. Chase Caller and felt to be at mod risk for pulm complications post op.  PCCM consulted post op for COPD mgmt.  In PACU with afib rvr 160 with associated hypotension. NO SOB. No CP.  PAST MEDICAL HISTORY :  Past Medical History  Diagnosis Date  . COPD (chronic obstructive pulmonary disease)   . Tobacco abuse     hx of  . Coronary artery disease     PCI, DES circumflex 2/11. moderate disease LAD  . Hyperlipidemia   . Ischemic cardiomyopathy     Echo 08/14/09 with LVEF 40-45%.   . A-fib   . 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 Acute pyelonephritis 01/28/2014  . Ischemic cardiomyopathy     improved EF of 55-60% (01/28/14)  . Hypertension     hx of- none now  . Dysrhythmia     a fib  . PVD (peripheral vascular disease)     lower extremity runoff 05/08/10  . Pneumonia     hx of  . Myocardial infarction 2011  . Anxiety   . History of kidney stones   . Shortness of breath     with activity   Past Surgical History  Procedure Laterality Date  . Coronary stent placement  2011  . Femoral bypass Left May 2012  . Lower extremity angiogram  December 16, 2011  . Cardiac catheterization    . Tonsillectomy  63 years old   Prior  to Admission medications   Medication Sig Start Date End Date Taking? Authorizing Provider  albuterol (PROVENTIL HFA;VENTOLIN HFA) 108 (90 BASE) MCG/ACT inhaler Inhale 2 puffs into the lungs every 6 (six) hours as needed for wheezing. 11/09/13  Yes Brand Males, MD  ALPRAZolam Duanne Moron) 0.5 MG tablet Take 1 tablet (0.5 mg total) by mouth daily as needed for anxiety or sleep. 01/31/14  Yes Rexene Alberts, MD  diphenhydramine-acetaminophen (TYLENOL PM) 25-500 MG TABS Take 2 tablets by mouth at bedtime as needed (sleep).   Yes Historical Provider, MD  escitalopram (LEXAPRO) 10 MG tablet Take 10 mg by mouth at bedtime.   Yes Historical Provider, MD  furosemide (LASIX) 20 MG tablet Take 20 mg by mouth every morning.   Yes Historical Provider, MD  levalbuterol (XOPENEX) 0.63 MG/3ML nebulizer solution Take 3 mLs (0.63 mg total) by nebulization every 8 (eight) hours. 01/31/14  Yes Rexene Alberts, MD  metoprolol (LOPRESSOR) 50 MG tablet Take 1.5 tablets (75 mg total) by mouth 2 (two) times daily. 02/09/14  Yes Burnell Blanks, MD  mometasone-formoterol [facility]) 200-5 MCG/ACT AERO Take 2 puffs first thing in am and then another 2 puffs about 12 hours later. 02/20/14  Yes Brand Males, MD  potassium chloride (K-DUR) 10 MEQ  tablet Take 10 mEq by mouth every morning. With the lasix   Yes Historical Provider, MD  simvastatin (ZOCOR) 20 MG tablet Take 20 mg by mouth every evening.   Yes Historical Provider, MD  tiotropium (SPIRIVA) 18 MCG inhalation capsule Place 18 mcg into inhaler and inhale every morning.   Yes Historical Provider, MD  clopidogrel (PLAVIX) 75 MG tablet Take 75 mg by mouth every morning.    Historical Provider, MD  HYDROcodone-acetaminophen (NORCO) 5-325 MG per tablet Take 1-2 tablets by mouth every 6 (six) hours as needed. 03/22/14   Debbrah Alar, PA-C   Allergies  Allergen Reactions  . Ace Inhibitors Cough  . Amoxicillin-Pot Clavulanate Hives  . Codeine Nausea And Vomiting    FAMILY  HISTORY:  Family History  Problem Relation Age of Onset  . Coronary artery disease Mother     deceased. coronary artery bypass graft  . Cancer Mother     BREAST  . Heart disease Mother     PVD  - Amputation-Leg  . Hyperlipidemia Mother   . Hypertension Mother   . Heart attack Mother   . Cirrhosis Father     deceased  . Cancer Father   . Other Brother     alive, unknown hx   SOCIAL HISTORY:  reports that she quit smoking about 4 years ago. Her smoking use included Cigarettes. She has a 45 pack-year smoking history. She has never used smokeless tobacco. She reports that she does not drink alcohol or use illicit drugs.  REVIEW OF SYSTEMS:   As per HPI - All other systems reviewed and were neg.    SUBJECTIVE: mild pain, no distress, BP wnl  VITAL SIGNS: Temp:  [97.7 F (36.5 C)-98.3 F (36.8 C)] 98.3 F (36.8 C) (09/16 1600) Pulse Rate:  [91-125] 125 (09/16 1600) Resp:  [18-19] 19 (09/16 1600) BP: (131-145)/(78-91) 145/91 mmHg (09/16 1600) SpO2:  [100 %] 100 % (09/16 1600) Weight:  [164 lb (74.39 kg)] 164 lb (74.39 kg) (09/16 1006)  PHYSICAL EXAMINATION: General: awake in PACU Neuro:  nonfocal exam, alert cooperative HEENT:  No jvd Cardiovascular:  s1 s2 IRT mild 105, distant Lungs:  Good air entry, no bronchospasm Abdomen:  Soft, bs hypo to none, scar small clean, no r/g Musculoskeletal:  No edema Skin:  No rash   Recent Labs Lab 03/17/14 1330  NA 141  K 4.4  CL 100  CO2 27  BUN 16  CREATININE 0.71  GLUCOSE 97    Recent Labs Lab 03/17/14 1330  HGB 12.0  HCT 37.4  WBC 12.8*  PLT 417*   No results found.  ASSESSMENT / PLAN:  COPD - gold stage 3, on home O2.  Stable at baseline.  Renal cell carcinoma - s/p L radical nephrectomy 9/16 Known Fib with episode RVR in pacu Moderate risk as described by Dr Chase Caller pre op  REC -  Aggressive pulmonary hygiene post op, IS F/u CXR now for atx driving fib and in am  Supplemental O2 as needed to keep  sats 88-92%  Add BD - xopenex q6h, especially with rvr issues, pulmicort for now (home rx xopenex, dulera, spiriva)  Monitor h/h now in pacu although hr and BP responded well to rate control Be careful with pain control - avoid oversedation  Mobilize when ok with urology  Post op care per urology  Lytes now for contribution to rvr Trop now, ecg, has a h/o cad Add ppi with events above, likley can dc in am  Add ssi Output reduced, bolus and add rate volume, assess urine sp, urine na, osm  I have fully examined this patient and agree with above findings.     Lavon Paganini. Titus Mould, MD, [place] Pgr: [facility]

## 2014-03-22 NOTE — Consult Note (Signed)
CARDIOLOGY CONSULT NOTE   Patient ID: Allison [name] MRN: [id number], DOB/AGE: 61-May-1952   Admit date: 03/22/2014 Date of Consult: 03/22/2014   Primary Physician: Michaell Cowing, MD Primary Cardiologist: C. Angelena Form, MD   Pt. Profile  63 y/o female with a h/o CAD, COPD, and ICM (with normalization of LV fxn), who was admitted today for radical L nephrectomy in the setting of renal cell carcinoma and developed rapid afib and hypotension postoperatively.  Problem List  Past Medical History  Diagnosis Date  . COPD (chronic obstructive pulmonary disease)   . Tobacco abuse     hx of  . Coronary artery disease     a. 08/2009 NSTEMI s/p PCI/DES to LCX. Mod residual LAD dzs;  b. 02/2014 Nonischemic myoview.  . Hyperlipidemia   . Ischemic cardiomyopathy     a. Echo 08/14/09 with LVEF 40-45%;  b. 01/2014 Echo: EF 55-60%, basal-mid inflat and inf HK, triv MR/TR, mod dil LA/RA.  Marland Kitchen A-fib     a. Dx 01/2014 - rate controlled.  . 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 Acute pyelonephritis 01/28/2014  . Hypertension   . PVD (peripheral vascular disease)     a. 11/2010 L Fem to above knee Pop bypass;  b. 12/2011 Angio: patent L fem->pop graft, LCFZ 50-60%.  . Pneumonia     hx of  . Anxiety   . History of kidney stones   . Carotid disease, bilateral     a. 11/2011 U/S: RICA 10-27, LICA <25.  . Lung nodule     a. 1cm RLL nodule - followed by pulmonology.    Past Surgical History  Procedure Laterality Date  . Coronary stent placement  2011  . Femoral bypass Left May 2012  . Lower extremity angiogram  December 16, 2011  . Cardiac catheterization    . Tonsillectomy  63 years old     Allergies  Allergies  Allergen Reactions  . Ace Inhibitors Cough  . Amoxicillin-Pot Clavulanate Hives  . Codeine Nausea And Vomiting    HPI   63 y/o female with a h/o CAD s/p PCI/DES of the LCX in 2011.  She also has a h/o PAD and non-obstructive carotid dzs.  In July of this year she presented to the  hospital with abd pain and hematuria and found to be in afib.  Afib was able to be rate controlled with bb therapy.  She had been on plavix chronically and that was initially held.  Imaging revealed a new dx of L renal mass, later dx as renal cell carcinoma .  She was also found to have a RLL nodule.  Hematuria resolved and she has been followed as an outpt by urology.  She was seen by both pulmonology and cardiology over the summer with negative nuc study and clearance for surgery/nephrectomy provided.  When she was seen by Dr. Angelena Form, it was felt that she would eventually require initiation of a NOAC ([id number] Today, she underwent robotic assisted lap radical left nephrectomy.  Post-operatively, she became tachycardic in the setting of baseline afib with rates into the 160's.  She was given 2 doses of IV diltiazaem, 10 and then 5, with subsequent drop in BP.  Rate has since improved and BP is stable.  We have been asked to eval.  She currently only complains of mild abdominal pain.  She denies chest pain, palpitations, dyspnea, pnd, orthopnea, n, v, dizziness, syncope, edema, weight gain, or early satiety.   Inpatient Medications  .  acetaminophen  1,000 mg Oral 4 times per day  . budesonide  0.25 mg Nebulization Q6H  . diltiazem      . escitalopram  10 mg Oral QHS  . HYDROmorphone      . insulin aspart  0-9 Units Subcutaneous 6 times per day  . levalbuterol  1.25 mg Nebulization Q6H  . metoprolol  5 mg Intravenous 4 times per day  . metoprolol  25 mg Oral BID  . pantoprazole (PROTONIX) IV  40 mg Intravenous Q24H  . simvastatin  20 mg Oral QPM  . sodium chloride      . [START ON 03/23/2014] tiotropium  18 mcg Inhalation q morning - 10a    Family History Family History  Problem Relation Age of Onset  . Coronary artery disease Mother     deceased. coronary artery bypass graft  . Cancer Mother     BREAST  . Heart disease Mother     PVD  - Amputation-Leg  . Hyperlipidemia Mother   .  Hypertension Mother   . Heart attack Mother   . Cirrhosis Father     deceased  . Cancer Father   . Other Brother     alive, unknown hx     Social History History   Social History  . Marital Status: Married    Spouse Name: N/A    Number of Children: 3  . Years of Education: N/A   Occupational History  . sculptor    Social History Main Topics  . Smoking status: Former Smoker -- 1.50 packs/day for 30 years    Types: Cigarettes    Quit date: 08/12/2009  . Smokeless tobacco: Never Used     Comment: she has smoker 1-2 packs daily since the age of 53. 34 pack years  . Alcohol Use: No  . Drug Use: No  . Sexual Activity: Not on file   Other Topics Concern  . Not on file   Social History Narrative  . No narrative on file     Review of Systems  General:  No chills, fever, night sweats or weight changes.  Cardiovascular:  No chest pain, dyspnea on exertion, edema, orthopnea, palpitations, paroxysmal nocturnal dyspnea. Dermatological: No rash, lesions/masses Respiratory: No cough, dyspnea Urologic: +++ hematuria - July, no dysuria Abdominal:   Abd pain post op.  No nausea, vomiting, diarrhea, bright red blood per rectum, melena, or hematemesis Neurologic:  No visual changes, wkns, changes in mental status. All other systems reviewed and are otherwise negative except as noted above.  Physical Exam  Blood pressure 126/55, pulse 110, temperature 98.2 F (36.8 C), temperature source Oral, resp. rate 14, height 5\' 7"  (1.702 m), weight 164 lb (74.39 kg), SpO2 95.00%.  General: Pleasant, NAD Psych: Normal affect. Neuro: Alert and oriented X 3. Moves all extremities spontaneously. HEENT: Normal  Neck: Supple without bruits or JVD. Lungs:  Resp regular and unlabored, CTA. Heart: IR, IR no s3, s4, or murmurs. Abdomen: Soft, diffuse abd pain upon light touch.  non-distended, No BS. Extremities: No clubbing, cyanosis or edema. DP/PT/Radials 2+ and equal bilaterally.  Labs  Lab  Results  Component Value Date   WBC 19.2* 03/22/2014   HGB 11.3* 03/22/2014   HGB 11.5* 03/22/2014   HCT 35.5* 03/22/2014   HCT 36.4 03/22/2014   MCV 88.3 03/22/2014   PLT 406* 03/22/2014     Recent Labs Lab 03/22/14 1620  NA 138  K 4.2  CL 101  CO2 23  BUN 13  CREATININE 0.83  CALCIUM 9.5  PROT 7.5  BILITOT 0.3  ALKPHOS 120*  ALT 14  AST 15  GLUCOSE 148*   Lab Results  Component Value Date   CHOL 171 11/30/2012   HDL 64.70 11/30/2012   LDLCALC 93 11/30/2012   TRIG 66.0 11/30/2012    Radiology/Studies  Portable Chest Xray - Atelectasis  03/22/2014   CLINICAL DATA:  Tachycardia  EXAM: PORTABLE CHEST - 1 VIEW  COMPARISON:  08/12/2009, 02/07/2014  FINDINGS: Mild bilateral interstitial thickening. There is no focal parenchymal opacity, pleural effusion, or pneumothorax. The heart and mediastinal contours are unremarkable.  The osseous structures are unremarkable.  IMPRESSION: Mild bilateral interstitial thickening likely reflecting mild interstitial edema.   Electronically Signed   By: Kathreen Devoid   On: 03/22/2014 17:16   ECG  12 lead not available.  Strips from PACU show afib.  ASSESSMENT AND PLAN  1. Afib:  Pt dx with afib in July and this has been persistent since.  She has been rate-controlled with PO metoprolol 75mg  bid @ home.  She did take a dose this AM.  She became tachycardic immediately post-op and then hypotensive after receiving IV dilt.  She is currently hemodynamically stable and rate is reasonably controlled.  Once taking PO's would resume metoprolol 75mg  BID.  We will plan to initiate NOAC therapy once cleared by surgery for anticoagulation.  2.  CAD:  S/P PCI in 2011 with negative myoview in August.  No chest pain despite rapid afib.  Troponin nl.  Cont bb, statin.  She had previously been on ASA/Plavix, which is on hold in setting of surgery.  Once NOAC started, will need to decide to stop either plavix or ASA.  DES was 4 yrs ago.  3.  H/O Cardiomyopathy:  Nl  EF by echo in July.  Watch volume.  Resume BB.   4.  L Renal cell carcinoma:  S/p nephrectomy - per urology.  5.  RLL Nodule:  Outpt pulm f/u and repeat CT.  6.  PAD:  No complaints.  Cont statin.   Signed, Murray Hodgkins, NP 03/22/2014, 6:35 PM  Patient seen. Agree with above assessment and plan. She is tolerating her atrial fibrillation without chest pain or increased dyspnea. No angina since her DES 4 years ago. Once on NOAC  I would favor stopping plavix and continuing ASA.

## 2014-03-22 NOTE — Transfer of Care (Signed)
Immediate Anesthesia Transfer of Care Note  Patient: Allison [name]  Procedure(s) Performed: Procedure(s) (LRB): ROBOTIC ASSISTED LAPAROSCOPIC RADICAL NEPHRECTOMY LEFT (Left)  Patient Location: PACU  Anesthesia Type: General  Level of Consciousness: sedated, patient cooperative and responds to stimulation  Airway & Oxygen Therapy: Patient Spontanous Breathing and Patient connected to face mask oxgen  Post-op Assessment: Report given to PACU RN and Post -op Vital signs reviewed. Esmolol 40 mg given for HR 130-140s, HR down 120s, Anesthesiologist updated.  Post vital signs: Reviewed and stable  Complications: No apparent anesthesia complications

## 2014-03-22 NOTE — Progress Notes (Signed)
PACU note-----Dr. Titus Mould in to see pt, portable CXR done; up date given to Dr. Kalman Shan and OK to transfer pt to stepdown for continued care and monitoring

## 2014-03-23 ENCOUNTER — Inpatient Hospital Stay (HOSPITAL_COMMUNITY): Payer: Medicaid Other

## 2014-03-23 ENCOUNTER — Encounter (HOSPITAL_COMMUNITY): Payer: Self-pay | Admitting: Urology

## 2014-03-23 DIAGNOSIS — J449 Chronic obstructive pulmonary disease, unspecified: Secondary | ICD-10-CM

## 2014-03-23 LAB — GLUCOSE, CAPILLARY
GLUCOSE-CAPILLARY: 166 mg/dL — AB (ref 70–99)
GLUCOSE-CAPILLARY: 183 mg/dL — AB (ref 70–99)
Glucose-Capillary: 138 mg/dL — ABNORMAL HIGH (ref 70–99)
Glucose-Capillary: 138 mg/dL — ABNORMAL HIGH (ref 70–99)
Glucose-Capillary: 132 mg/dL — ABNORMAL HIGH (ref 70–99)

## 2014-03-23 LAB — BASIC METABOLIC PANEL
ANION GAP: 11 (ref 5–15)
BUN: 14 mg/dL (ref 6–23)
CO2: 24 meq/L (ref 19–32)
CREATININE: 1.07 mg/dL (ref 0.50–1.10)
Calcium: 9.5 mg/dL (ref 8.4–10.5)
Chloride: 98 mEq/L (ref 96–112)
GFR calc Af Amer: 64 mL/min — ABNORMAL LOW (ref >90)
GFR calc non Af Amer: 55 mL/min — ABNORMAL LOW (ref >90)
Glucose, Bld: 155 mg/dL — ABNORMAL HIGH (ref 70–99)
Potassium: 4.2 mEq/L (ref 3.7–5.3)
Sodium: 133 mEq/L — ABNORMAL LOW (ref 137–147)

## 2014-03-23 LAB — PHOSPHORUS: Phosphorus: 3.6 mg/dL (ref 2.3–4.6)

## 2014-03-23 LAB — OSMOLALITY, URINE: Osmolality, Ur: 499 mOsm/kg (ref 390–1090)

## 2014-03-23 LAB — TROPONIN I: Troponin I: <0.3 ng/mL (ref <0.30)

## 2014-03-23 LAB — SODIUM, URINE, RANDOM: Sodium, Ur: 16 mEq/L

## 2014-03-23 LAB — HEMOGLOBIN AND HEMATOCRIT, BLOOD
HEMATOCRIT: 34.1 % — AB (ref 36.0–46.0)
Hemoglobin: 10.6 g/dL — ABNORMAL LOW (ref 12.0–15.0)

## 2014-03-23 LAB — MAGNESIUM: Magnesium: 1.9 mg/dL (ref 1.5–2.5)

## 2014-03-23 MED ORDER — METOPROLOL TARTRATE 25 MG PO TABS
50.0000 mg | ORAL_TABLET | Freq: Two times a day (BID) | ORAL | Status: DC
  Administered 2014-03-23: 50 mg via ORAL
  Filled 2014-03-23: qty 2

## 2014-03-23 MED ORDER — FENTANYL CITRATE 0.05 MG/ML IJ SOLN
25.0000 ug | INTRAMUSCULAR | Status: DC | PRN
  Administered 2014-03-23 (×4): 50 ug via INTRAVENOUS
  Administered 2014-03-24: 25 ug via INTRAVENOUS
  Filled 2014-03-23 (×5): qty 2

## 2014-03-23 MED ORDER — CETYLPYRIDINIUM CHLORIDE 0.05 % MT LIQD
7.0000 mL | Freq: Two times a day (BID) | OROMUCOSAL | Status: DC
  Administered 2014-03-24: 7 mL via OROMUCOSAL

## 2014-03-23 MED ORDER — INSULIN ASPART 100 UNIT/ML ~~LOC~~ SOLN: 0.0000 [IU] | Freq: Three times a day (TID) | SUBCUTANEOUS | Status: DC

## 2014-03-23 NOTE — Progress Notes (Signed)
Patient Name: Allison [name] Date of Encounter: 03/23/2014     Active Problems:   Renal mass    SUBJECTIVE  The patient had a reasonable night.  She did have some shortness of breath early this morning.  This responded to a breathing treatment.  Her heart rhythm remained atrial fibrillation with rapid ventricular response at times.  She denies any chest pain.  CURRENT MEDS . acetaminophen  1,000 mg Oral 4 times per day  . antiseptic oral rinse  7 mL Mouth Rinse q12n4p  . budesonide  0.25 mg Nebulization Q6H  . chlorhexidine  15 mL Mouth Rinse BID  . escitalopram  10 mg Oral QHS  . insulin aspart  0-9 Units Subcutaneous 6 times per day  . levalbuterol  1.25 mg Nebulization Q6H  . metoprolol  5 mg Intravenous 4 times per day  . metoprolol  25 mg Oral BID  . pantoprazole (PROTONIX) IV  40 mg Intravenous Q24H  . simvastatin  20 mg Oral QPM  . tiotropium  18 mcg Inhalation q morning - 10a    OBJECTIVE  Filed Vitals:   03/23/14 0600 03/23/14 0641 03/23/14 0800 03/23/14 0833  BP: 120/59  132/51   Pulse: 201  115   Temp:    98.5 F (36.9 C)  TempSrc:    Oral  Resp: 11  22   Height:      Weight:      SpO2: 96% 98% 98%     Intake/Output Summary (Last 24 hours) at 03/23/14 1038 Last data filed at 03/23/14 0800  Gross per 24 hour  Intake 3243.33 ml  Output    550 ml  Net 2693.33 ml   Filed Weights   03/22/14 0636 03/22/14 1006 03/23/14 0400  Weight: 164 lb (74.39 kg) 164 lb (74.39 kg) 166 lb 0.1 oz (75.3 kg)    PHYSICAL EXAM  General: Pleasant, NAD. Neuro: Alert and oriented X 3. Moves all extremities spontaneously. Psych: Normal affect. HEENT:  Normal  Neck: Supple without bruits or JVD. Lungs:  Resp regular and unlabored, CTA. Heart: RRR no s3, s4, or murmurs. Abdomen: Soft, non-tender, non-distended, BS + x 4.  Extremities: No clubbing, cyanosis or edema. DP/PT/Radials 2+ and equal bilaterally.  Accessory Clinical Findings  CBC  Recent Labs   03/22/14 1620 03/23/14 0401  WBC 19.2*  --   NEUTROABS 18.1*  --   HGB 11.3*  11.5* 10.6*  HCT 35.5*  36.4 34.1*  MCV 88.3  --   PLT 406*  --    Basic Metabolic Panel  Recent Labs  03/22/14 1620 03/22/14 1705 03/23/14 0401  NA 138  --  133*  K 4.2  --  4.2  CL 101  --  98  CO2 23  --  24  GLUCOSE 148*  --  155*  BUN 13  --  14  CREATININE 0.83  --  1.07  CALCIUM 9.5  --  9.5  MG  --  2.1 1.9  PHOS  --  4.4 3.6   Liver Function Tests  Recent Labs  03/22/14 1620  AST 15  ALT 14  ALKPHOS 120*  BILITOT 0.3  PROT 7.5  ALBUMIN 3.0*   No results found for this basename: LIPASE, AMYLASE,  in the last 72 hours Cardiac Enzymes  Recent Labs  03/22/14 1620 03/23/14 0401  TROPONINI <0.30 <0.30   BNP No components found with this basename: POCBNP,  D-Dimer No results found for this basename: DDIMER,  in  the last 72 hours Hemoglobin A1C No results found for this basename: HGBA1C,  in the last 72 hours Fasting Lipid Panel No results found for this basename: CHOL, HDL, LDLCALC, TRIG, CHOLHDL, LDLDIRECT,  in the last 72 hours Thyroid Function Tests  Recent Labs  03/22/14 1620  TSH 9.360*    TELE  Atrial fibrillation with rapid ventricular response.  ECG    Radiology/Studies  Dg Chest Port 1 View  03/23/2014   CLINICAL DATA:  Atelectasis  EXAM: PORTABLE CHEST - 1 VIEW  COMPARISON:  03/22/2014  FINDINGS: Chronic interstitial markings. Pulmonary vascular congestion with possible very mild interstitial edema. Mild patchy opacity in the left lower lobe likely reflects atelectasis. Lingular scarring. Underlying emphysematous changes. No pleural effusion or pneumothorax.  Cardiomegaly.  IMPRESSION: Pulmonary vascular congestion with possible very mild interstitial edema.  Lingular scarring with left lower lobe atelectasis  Underlying emphysematous changes.   Electronically Signed   By: Julian Hy M.D.   On: 03/23/2014 07:48   Portable Chest Xray -  Atelectasis  03/22/2014   CLINICAL DATA:  Tachycardia  EXAM: PORTABLE CHEST - 1 VIEW  COMPARISON:  08/12/2009, 02/07/2014  FINDINGS: Mild bilateral interstitial thickening. There is no focal parenchymal opacity, pleural effusion, or pneumothorax. The heart and mediastinal contours are unremarkable.  The osseous structures are unremarkable.  IMPRESSION: Mild bilateral interstitial thickening likely reflecting mild interstitial edema.   Electronically Signed   By: Kathreen Devoid   On: 03/22/2014 17:16    ASSESSMENT AND PLAN 1. Afib:  Once taking PO's would resume metoprolol 75mg  BID. We will plan to initiate NOAC therapy once cleared by surgery for anticoagulation.  2. CAD: S/P PCI in 2011 with negative myoview in August. No chest pain despite rapid afib. Troponin nl. Cont bb, statin.  3. H/O Cardiomyopathy: Nl EF by echo in July. Watch volume. Resume BB.  4. L Renal cell carcinoma: S/p nephrectomy - per urology.  5. RLL Nodule: Outpt pulm f/u and repeat CT.  6. PAD: No complaints. Cont statin.  Plan: We will increase beta blocker for better rate control of her atrial fibrillation.  Taking oral fluids.  Signed, Darlin Coco MD

## 2014-03-23 NOTE — Care Management Note (Signed)
    Page 1 of 1   03/23/2014     10:44:28 AM CARE MANAGEMENT NOTE 03/23/2014  Patient:  Allison [name], Allison [name]   Account Number:  [id number]  Date Initiated:  03/23/2014  Documentation initiated by:  Sunday Spillers  Subjective/Objective Assessment:   63 yo female admitted s/p nephrectomy with post op Afib with RVR. PTA lived at home with spouse.     Action/Plan:   Home when stable   Anticipated DC Date:  03/25/2014   Anticipated DC Plan:  [place]  CM consult      Choice offered to / List presented to:             Status of service:  Completed, signed off Medicare Important Message given?   (If response is "NO", the following Medicare IM given date fields will be blank) Date Medicare IM given:   Medicare IM given by:   Date Additional Medicare IM given:   Additional Medicare IM given by:    Discharge Disposition:  HOME/SELF CARE  Per UR Regulation:  Reviewed for med. necessity/level of care/duration of stay  If discussed at [place] of Stay Meetings, dates discussed:    Comments:

## 2014-03-23 NOTE — Progress Notes (Signed)
Patient reported some difficulty breathing.  Asked patient if similar symptoms ever occur at home and she said no.  MD notified of maintenance fluid rate and low urine output.  O2 sats maintained in the upper 90s on 2L [place].  Bladder scan performed per MD and zero cc found.  Orders received.  Will continue to monitor.

## 2014-03-23 NOTE — Op Note (Signed)
Allison [name] NO.:  [id number]  MEDICAL RECORD NO.:  [id number]  LOCATION:  0923                         FACILITY:  [facility]  PHYSICIAN:  Alexis Frock, MD     DATE OF BIRTH:  06-20-1951  DATE OF PROCEDURE:  03/22/2014 DATE OF DISCHARGE:                              OPERATIVE REPORT   DIAGNOSES:  Large left renal mass, small pulmonary nodules.  PROCEDURE:  Robotic-assisted laparoscopic left radical nephrectomy.  ASSISTANT:  Debbrah Alar, PA-C.  ESTIMATED BLOOD LOSS:  Less than 100 mL.  COMPLICATIONS:  None.  SPECIMENS:  Left radical nephrectomy.  FINDINGS: 1. Single artery, single vein, left renovascular anatomy. 2. Significant dysplasia around the left kidney and left hilum     worrisome for possible T3 disease.  No bulky adenopathy.  INDICATION:  Allison [name] is a pleasant 63 year old lady with history of significant cardiopulmonary comorbidity including CHF and COPD.  She was found on workup of hematuria to have a large left renal mass.  She also had some small scattered pulmonary nodules that were indeterminate.  She remained symptomatic for gross hematuria.  Options were discussed for management including left nephrectomy with and without minimal invasive assistance with curative intent, although some discussion was clearly carried towards possible palliative approach if her lung nodules were in fact represent early metastatic disease.  The patient voiced understanding and adamantly wished to proceed.  She underwent extensive cardiopulmonary evaluation preoperatively kindly by our pulmonary colleagues and was found to be at suitable risk and had been medically optimized as such.  Informed consent was obtained and placed in medical record.  PROCEDURE IN DETAIL:  The patient being Allison [name] was verified, procedure being left robotic nephrectomy was confirmed.  Procedure was carried out.  Time-out was performed.  Intravenous antibiotics  were administered.  General endotracheal anesthesia was introduced.  The patient was placed into a left side up with flank position applying 15 degrees of stable flexion, superior arm elevator, axillary roll, bean bag, bottom leg bent, top leg straight.  Sequential compression device were applied.  She was further fashioned the OR table using 3-inch tape over foam padding.  Sterile field was created by prepping and draping the patient's entire left flank using chlorhexidine gluconate.  Foley catheter had been placed per urethra to straight drain.  Next, a high- flow, low-pressure pneumoperitoneum was obtained using Veress technique in the left hemiabdomen having passed the aspiration and drop test. Next, an 8-mm robotic camera port was placed in position approximately 4 fingerbreadths superolateral to the umbilicus.  Laparoscopic examination and peritoneal cavity revealed no significant adhesions.  No visceral injury.  Additional ports were then placed as follows.  Left subcostal 8- mm robotic port, left paramedian inferior 8-mm robotic port, left far lateral 8 mm assistant port approximately 1 handbreadth above the anterior iliac spine and two 12-mm assistant ports in the midline; one 2 fingerbreadths above the camera port and one 2 fingerbreadths below. Robot was docked and passed through electronic checks.  Initial attention was directed at the left retroperitoneum.  Incision was made lateral to the descending colon from the area of the splenic flexure towards the area  of the internal ring and the colon was carefully mobilized medially.  There was significant desmoplasia in reaction in the area of the tumor and the colonic mesentery.  However, mesentery was very carefully mobilized away from this to avoid devascularization of the bowel, which did not occur and the lower pole of the kidney was placed on gentle lateral traction.  Dissection proceeded medial to this. The left ureter and  gonadal vessels were encountered and placed on gentle lateral traction and dissection was proceeded through triangle towards the urethra and hilum.  On the urethra and hilum, there was significant desmoplasia consistent with possible locally advanced disease.  There was no large lymphadenopathy.  The left renal hilum consisted of a single artery, a single vein renovascular anatomy.  The artery was controlled using a single Hem-o-Lok clip down followed by endovascular stapler although distal.  The vein was only controlled with vascular stapler.  Superior medial attachments separating the lateral aspect of the adrenal were taken down using vascular stapler and lateral superior attachments were taken down using cautery scissors.  The ureter was controlled using doubly clipped layer and the gonadal vessels were controlled using vascular stapler. This completely freed up the left nephrectomy specimen was placed into a EndoCatch bag for later retrieval.  Following these maneuvers, hemostasis appeared excellent. All sponge and needle counts were correct.  The area of the nephrectomy bed was inspected and again found to be hemostatic.  A specimen was retrieved by connecting the previous 2 assistant ports in the midline and removing the specimen in its entirety, set aside for permanent pathology.  This extraction site was closed at the level of fascia using a figure-of-eight PDS x4 followed by reapproximating the Scarpa's using running Vicryl.  All incision sites were infiltrated with dilute lyophilized Marcaine and closed level of skin using subcuticular Monocryl followed by Dermabond.  Procedure was then terminated.  The patient tolerated the procedure well.  There were no immediate periprocedural complications.  The patient was taken to postanesthesia care unit in stable condition.  As discussed preoperatively with the Pulmonary Team, she will be admitted to the step-down unit for  close pulmonary monitoring with some critical care consultation already submitted.          ______________________________ Alexis Frock, MD     TM/MEDQ  D:  03/22/2014  T:  03/23/2014  Job:  [id number]

## 2014-03-23 NOTE — Consult Note (Signed)
Name: Allison [name] MRN: [id number] DOB: 07-21-1950    ADMISSION DATE:  03/22/2014 CONSULTATION DATE:  9/16  REFERRING MD :  Tresa Moore PRIMARY SERVICE:  Urology  CHIEF COMPLAINT:  COPD  BRIEF PATIENT DESCRIPTION: 63yo female remote smoker with hx gold stage 3 COPD on home O2, AFib, CAD admitted 9/16 for elective L radical nephrectomy in setting renal cell carcinoma.  PCCM consulted post op for COPD mgmt, afib rvr, pulmonary management.  SIGNIFICANT EVENTS / STUDIES:  9/16 L radical nephrectomy   LINES / TUBES: piv  CULTURES:  ANTIBIOTICS: Peri-op Clinda  HISTORY OF PRESENT ILLNESS:  63yo female remote smoker with hx COPD, AFib, CAD admitted 9/16 for elective L radical nephrectomy in setting renal cell carcinoma.  Was seen for pre-op clearance by Dr. Chase Caller and felt to be at mod risk for pulm complications post op.  PCCM consulted post op for COPD mgmt.  In PACU with afib rvr 160 with associated hypotension. NO SOB. No CP.  SUBJECTIVE:  No acute distress. Looks very comfortable. On O2. Up to chair  VITAL SIGNS: Temp:  [97.4 F (36.3 C)-98.5 F (36.9 C)] 98.5 F (36.9 C) (09/17 0833) Pulse Rate:  [88-201] 115 (09/17 0800) Resp:  [9-22] 22 (09/17 0800) BP: (98-145)/(51-91) 132/51 mmHg (09/17 0800) SpO2:  [93 %-100 %] 98 % (09/17 0800) Weight:  [164 lb (74.39 kg)-166 lb 0.1 oz (75.3 kg)] 166 lb 0.1 oz (75.3 kg) (09/17 0400)  PHYSICAL EXAMINATION: General: awake , nad Neuro:  Intact, cooperative HEENT:  No jvd, [place] at 2l Cardiovascular:  HSIR IR a fib 106 Lungs:  Good air entry, CTA, no wheeze Abdomen:  Soft, bs hypo to none, incision well approx with staples, no r/g Musculoskeletal:  No edema Skin:  No rash   Recent Labs Lab 03/17/14 1330 03/22/14 1620 03/23/14 0401  NA 141 138 133*  K 4.4 4.2 4.2  CL 100 101 98  CO2 27 23 24   BUN 16 13 14   CREATININE 0.71 0.83 1.07  GLUCOSE 97 148* 155*    Recent Labs Lab 03/17/14 1330 03/22/14 1620 03/23/14 0401  HGB  12.0 11.3*  11.5* 10.6*  HCT 37.4 35.5*  36.4 34.1*  WBC 12.8* 19.2*  --   PLT 417* 406*  --    Dg Chest Port 1 View  03/23/2014   CLINICAL DATA:  Atelectasis  EXAM: PORTABLE CHEST - 1 VIEW  COMPARISON:  03/22/2014  FINDINGS: Chronic interstitial markings. Pulmonary vascular congestion with possible very mild interstitial edema. Mild patchy opacity in the left lower lobe likely reflects atelectasis. Lingular scarring. Underlying emphysematous changes. No pleural effusion or pneumothorax.  Cardiomegaly.  IMPRESSION: Pulmonary vascular congestion with possible very mild interstitial edema.  Lingular scarring with left lower lobe atelectasis  Underlying emphysematous changes.   Electronically Signed   By: Julian Hy M.D.   On: 03/23/2014 07:48   Portable Chest Xray - Atelectasis  03/22/2014   CLINICAL DATA:  Tachycardia  EXAM: PORTABLE CHEST - 1 VIEW  COMPARISON:  08/12/2009, 02/07/2014  FINDINGS: Mild bilateral interstitial thickening. There is no focal parenchymal opacity, pleural effusion, or pneumothorax. The heart and mediastinal contours are unremarkable.  The osseous structures are unremarkable.  IMPRESSION: Mild bilateral interstitial thickening likely reflecting mild interstitial edema.   Electronically Signed   By: Kathreen Devoid   On: 03/22/2014 17:16    ASSESSMENT / PLAN:  COPD - gold stage 3, on home O2 nocturnally.  Stable at baseline.  Renal cell carcinoma -  s/p L radical nephrectomy 9/16 Known A Fib with episode RVR in pacu, currently afib with cards following, CVR, restarting lopressor Moderate risk as described by Dr Chase Caller pre op  REC -  Aggressive pulmonary hygiene post op, IS F/u CXR  Supplemental O2 as needed to keep sats 88-92%  Add BD - xopenex q6h, especially with rvr issues, pulmicort for now (home rx xopenex, dulera, spiriva)  Be careful with pain control - avoid oversedation  Mobilize when ok with urology  Add ppi with events above, likley can dc in am    Add ssi Cards following  Richardson Landry Minor ACNP Maryanna Shape PCCM Pager [phone] till 3 pm If no answer page [phone] 03/23/2014, 8:50 AM   Baltazar Apo, MD, PhD 03/23/2014, 11:51 AM  Pulmonary and Critical Care [phone] or if no answer [phone]

## 2014-03-23 NOTE — Progress Notes (Addendum)
1 Day Post-Op Subjective: Afib with RVR up to 150 last night. EKG performed. Improved rate control with medication. Pain well controlled. Tolerating clears without nausea/vomiting. No flatus/BM. Did not get OOB. Using incentive spirometry. Complaining of some discomfort deep in her throat but doesn't think it's from the ET tube.  Objective: Vital signs in last 24 hours: Temp:  [97.4 F (36.3 C)-98.5 F (36.9 C)] 98.3 F (36.8 C) (09/17 0400) Pulse Rate:  [88-155] 106 (09/17 0500) Resp:  [9-22] 13 (09/17 0500) BP: (98-145)/(52-91) 104/56 mmHg (09/17 0400) SpO2:  [93 %-100 %] 98 % (09/17 0641) Weight:  [74.39 kg (164 lb)-75.3 kg (166 lb 0.1 oz)] 75.3 kg (166 lb 0.1 oz) (09/17 0400)  Intake/Output from previous day: 09/16 0701 - 09/17 0700 In: 2993.3 [P.O.:240; I.V.:2753.3] Out: 285 [Urine:260; Blood:25] Intake/Output this shift: Total I/O In: 1240 [P.O.:240; I.V.:1000] Out: 95 [Urine:95]  Physical Exam:  General: Alert and oriented CV: RRR Lungs: Clear Abdomen: Soft, ND Incisions: c/d/i Foley: clear yellow Ext: NT, No erythema  Lab Results:  Recent Labs  03/22/14 1620 03/23/14 0401  HGB 11.3*  11.5* 10.6*  HCT 35.5*  36.4 34.1*   BMET  Recent Labs  03/22/14 1620 03/23/14 0401  NA 138 133*  K 4.2 4.2  CL 101 98  CO2 23 24  GLUCOSE 148* 155*  BUN 13 14  CREATININE 0.83 1.07  CALCIUM 9.5 9.5     Studies/Results: Portable Chest Xray - Atelectasis  03/22/2014   CLINICAL DATA:  Tachycardia  EXAM: PORTABLE CHEST - 1 VIEW  COMPARISON:  08/12/2009, 02/07/2014  FINDINGS: Mild bilateral interstitial thickening. There is no focal parenchymal opacity, pleural effusion, or pneumothorax. The heart and mediastinal contours are unremarkable.  The osseous structures are unremarkable.  IMPRESSION: Mild bilateral interstitial thickening likely reflecting mild interstitial edema.   Electronically Signed   By: Kathreen Devoid   On: 03/22/2014 17:16    Assessment/Plan: POD#1  from robot assisted laparoscopic cytoreductive nephrectomy for presumed metastatic RCC. Doing well in step down unit after initial afib with RVR and hypotension post-op. Pulmonary critical care consulted.  N: Pain controlled with PO and IV pain meds. Continue pain meds. -- home lexapro  Pulm: History of COPD. Appreciate pulmonary CC recs -- pulmocort, spiriva, and levalbuterol inhalers -- was previously on home O2. Wean as tolerated  CV: History of Ischemic cardiomyopathy, afib, hyperlipidemia, and CAD with NSTEMI in 2011. Cardiology consulted, appreciate recs -- controlled with metoprolol (75mg  PO BID and 5mg  IV) -- home simvastatin  FEN/GI:  -- Gentle MIVF given cardiomyopathy, currently at 31mL/hr. Decrease as increasing PO -- Replete lytes PRN -- clear liquids, advance as tolerated today -- bowel regimen  GU: adequate UOP. D/C foley when mobile  Endo: DM -- SSI  PPx: SCDs, consider chemoppx  Dispo: Step down, consider floor today if stable from a cardiopulmonary status    LOS: 1 day   JOHNSON, DAVID C 03/23/2014, 6:48 AM     PT seen and examined.  S: POD 1 s/p left robotic nephrectomy Some RVR last PM that responded to prn meds UOP low but acceptable Pain controlled, NO CP. Doing well with pulm toilet  O: NAD O2NC, Husband at bedside HR 105 and sinus at present Surgical sites c/d/i. No drainage or rebound. SCD's in place, no LE edema.  Hgb >10, Cr <1.5, lytes normal.   A/P: 1 - advance diet, DC foley 2 - greatly appreciate PCCM comanagement 3- like to keep stepdown at least another day  pending more stable cardiac rythem.

## 2014-03-24 ENCOUNTER — Inpatient Hospital Stay (HOSPITAL_COMMUNITY): Payer: Medicaid Other

## 2014-03-24 LAB — GLUCOSE, CAPILLARY
GLUCOSE-CAPILLARY: 113 mg/dL — AB (ref 70–99)
GLUCOSE-CAPILLARY: 125 mg/dL — AB (ref 70–99)
GLUCOSE-CAPILLARY: 88 mg/dL (ref 70–99)
Glucose-Capillary: 99 mg/dL (ref 70–99)
Glucose-Capillary: 92 mg/dL (ref 70–99)
Glucose-Capillary: 103 mg/dL — ABNORMAL HIGH (ref 70–99)

## 2014-03-24 LAB — CBC
HCT: 31 % — ABNORMAL LOW (ref 36.0–46.0)
HEMOGLOBIN: 10 g/dL — AB (ref 12.0–15.0)
MCH: 27.9 pg (ref 26.0–34.0)
MCHC: 32.3 g/dL (ref 30.0–36.0)
MCV: 86.4 fL (ref 78.0–100.0)
Platelets: 332 10*3/uL (ref 150–400)
RBC: 3.59 MIL/uL — AB (ref 3.87–5.11)
RDW: 14.6 % (ref 11.5–15.5)
WBC: 15.3 10*3/uL — ABNORMAL HIGH (ref 4.0–10.5)

## 2014-03-24 LAB — BASIC METABOLIC PANEL
ANION GAP: 9 (ref 5–15)
BUN: 15 mg/dL (ref 6–23)
CO2: 26 meq/L (ref 19–32)
Calcium: 9.2 mg/dL (ref 8.4–10.5)
Chloride: 100 mEq/L (ref 96–112)
Creatinine, Ser: 1.21 mg/dL — ABNORMAL HIGH (ref 0.50–1.10)
GFR calc Af Amer: 55 mL/min — ABNORMAL LOW (ref >90)
GFR calc non Af Amer: 47 mL/min — ABNORMAL LOW (ref >90)
GLUCOSE: 116 mg/dL — AB (ref 70–99)
Potassium: 3.9 mEq/L (ref 3.7–5.3)
SODIUM: 135 meq/L — AB (ref 137–147)

## 2014-03-24 LAB — PHOSPHORUS: PHOSPHORUS: 2.9 mg/dL (ref 2.3–4.6)

## 2014-03-24 LAB — MAGNESIUM: MAGNESIUM: 1.9 mg/dL (ref 1.5–2.5)

## 2014-03-24 MED ORDER — PANTOPRAZOLE SODIUM 40 MG PO TBEC: 40.0000 mg | DELAYED_RELEASE_TABLET | Freq: Every day | ORAL | Status: DC

## 2014-03-24 MED ORDER — SENNOSIDES-DOCUSATE SODIUM 8.6-50 MG PO TABS: 1.0000 | ORAL_TABLET | Freq: Two times a day (BID) | ORAL | Status: DC

## 2014-03-24 MED ORDER — ASPIRIN 81 MG PO CHEW
81.0000 mg | CHEWABLE_TABLET | Freq: Every day | ORAL | Status: DC
  Administered 2014-03-24: 81 mg via ORAL
  Filled 2014-03-24: qty 1

## 2014-03-24 MED ORDER — METOPROLOL TARTRATE 25 MG PO TABS
75.0000 mg | ORAL_TABLET | Freq: Two times a day (BID) | ORAL | Status: DC
  Administered 2014-03-24: 75 mg via ORAL
  Filled 2014-03-24: qty 3

## 2014-03-24 MED ORDER — HEPARIN SODIUM (PORCINE) 5000 UNIT/ML IJ SOLN
5000.0000 [IU] | Freq: Three times a day (TID) | INTRAMUSCULAR | Status: DC
  Administered 2014-03-24 (×2): 5000 [IU] via SUBCUTANEOUS
  Filled 2014-03-24 (×2): qty 1

## 2014-03-24 NOTE — Discharge Summary (Signed)
Physician Discharge Summary  Patient ID: Allison [name] MRN: [id number] DOB/AGE: 1950-09-27 63 y.o.  Admit date: 03/22/2014 Discharge date: 03/24/2014  Admission Diagnoses: Left Renal Mass  Discharge Diagnoses: Left Renal Cell Carcinoma Stage pT3a Active Problems:   Renal mass   Discharged Condition: good  Hospital Course:   1 - Left Renal Cell Carcinoma - Underwent left robotic radical nephrectomy 9/16, the day of admission, withotu acute complications. Final pathology pT3a Clear cell with negative margins. By 9/18, the day of discharge, pt ambulatory, pain controlled, tollerating room air O2, on regular diet, and felt to be adquate for discharge.  2 - Cardiopulmonary Disease - Pt comanged inhouse by pulmononly and cardiology for her h/o CHF, arrythmia, and severe COPD. She had AFib with RVR initially that was rate-controlled at time of DC on home dose metoprolol. Troponins negative.   Consults: cardiology and pulmonary/intensive care  Significant Diagnostic Studies: labs: Hgb >10, Cr <1.5 at discharge.  Treatments: surgery: left robotic radical nephrectomy 9/16,  Discharge Exam: Blood pressure 131/69, pulse 112, temperature 98.6 F (37 C), temperature source Oral, resp. rate 25, height 5\' 7"  (1.702 m), weight 75.3 kg (166 lb 0.1 oz), SpO2 94.00%. General appearance: alert, cooperative, appears stated age and family at bedside Ears: normal external exam Nose: Nares normal. Septum midline. Mucosa normal. No drainage or sinus tenderness. Back: symmetric, no curvature. ROM normal. No CVA tenderness. Resp: non-labored on room air Cardio: tachycardia w/o chest pain. no LE edema.  GI: soft, non-tender; bowel sounds normal; no masses,  no organomegaly Extremities: extremities normal, atraumatic, no cyanosis or edema Pulses: 2+ and symmetric Skin: Skin color, texture, turgor normal. No rashes or lesions Lymph nodes: Cervical, supraclavicular, and axillary nodes normal. Neurologic:  Grossly normal Incision/Wound: recent port sites and extraction sites c/d/i.   Disposition: 01-Home or Self Care     Medication List    STOP taking these medications       clopidogrel 75 MG tablet  Commonly known as:  PLAVIX     diphenhydramine-acetaminophen 25-500 MG Tabs  Commonly known as:  TYLENOL PM      TAKE these medications       albuterol 108 (90 BASE) MCG/ACT inhaler  Commonly known as:  PROVENTIL HFA;VENTOLIN HFA  Inhale 2 puffs into the lungs every 6 (six) hours as needed for wheezing.     ALPRAZolam 0.5 MG tablet  Commonly known as:  XANAX  Take 1 tablet (0.5 mg total) by mouth daily as needed for anxiety or sleep.     escitalopram 10 MG tablet  Commonly known as:  LEXAPRO  Take 10 mg by mouth at bedtime.     furosemide 20 MG tablet  Commonly known as:  LASIX  Take 20 mg by mouth every morning.     HYDROcodone-acetaminophen 5-325 MG per tablet  Commonly known as:  NORCO  Take 1-2 tablets by mouth every 6 (six) hours as needed.     levalbuterol 0.63 MG/3ML nebulizer solution  Commonly known as:  XOPENEX  Take 3 mLs (0.63 mg total) by nebulization every 8 (eight) hours.     metoprolol 50 MG tablet  Commonly known as:  LOPRESSOR  Take 1.5 tablets (75 mg total) by mouth 2 (two) times daily.     mometasone-formoterol 200-5 MCG/ACT Aero  Commonly known as:  DULERA  Take 2 puffs first thing in am and then another 2 puffs about 12 hours later.     potassium chloride 10 MEQ tablet  Commonly known  as:  K-DUR  Take 10 mEq by mouth every morning. With the lasix     senna-docusate 8.6-50 MG per tablet  Commonly known as:  Senokot-S  Take 1 tablet by mouth 2 (two) times daily. While taking pain meds to prevent constipation     simvastatin 20 MG tablet  Commonly known as:  ZOCOR  Take 20 mg by mouth every evening.     tiotropium 18 MCG inhalation capsule  Commonly known as:  SPIRIVA  Place 18 mcg into inhaler and inhale every morning.            Follow-up Information   Follow up with Alexis Frock, MD On 04/04/2014. (at 9:30)    Specialty:  Urology   Contact information:   [place] [id number] [phone]       Signed: Alexis Frock 03/24/2014, 5:30 PM

## 2014-03-24 NOTE — Clinical Documentation Improvement (Signed)
Possible Clinical Conditions?  Acute Respiratory Failure Acute on Chronic Respiratory Failure Chronic Respiratory Failure Other Condition Cannot Clinically Determine   Supporting Information:(As per notes) "63yo female remote smoker with hx gold stage 3 COPD on home O2"  Thank You, Alessandra Grout, RN, BSN, CCDS,Clinical Documentation Specialist:  [phone]  [phone]=[facility] [place]- Health Information Management

## 2014-03-24 NOTE — Progress Notes (Signed)
Patient and spouse informed of discharge home and both feel that patient is ready for discharge home. Discharge instructions reviewed with patient & spouse. Patient states understanding of discharge instructions, discharge medication list and scheduled follow up appointments. All patient belongings removed from room by patient spouse.

## 2014-03-24 NOTE — Progress Notes (Signed)
Paged Dr Tresa Moore about pt transfer orders-- He called and stated he would be discharging pt from the ICU today around 4-5pm.

## 2014-03-24 NOTE — Progress Notes (Signed)
This patient is receiving Protonix. Based on criteria approved by the Pharmacy and Therapeutics Committee, this medication is being converted to the equivalent oral dose form. These criteria include:   . The patient is eating (either orally or per tube) and/or has been taking other orally administered medications for at least 24 hours.  . This patient has no evidence of active gastrointestinal bleeding or impaired GI absorption (gastrectomy, short bowel, patient on TNA or NPO).   If you have questions about this conversion, please contact the pharmacy department.  Kara Mead, [facility] 03/24/2014 10:09 AM

## 2014-03-24 NOTE — Progress Notes (Signed)
PCCM Brief Note  Allison [name] looks great from a pulmonary standpoint post-op. She may be discharged this pm.  Will need to go home back on her > Spiriva once daily + Dulera 2 puffs BID + albuterol or xopenex prn (she has both on her med list)  Please call us if we can assist   Baltazar Apo, MD, PhD 03/24/2014, 10:50 AM [place] [facility] [phone] or if no answer [phone]

## 2014-03-24 NOTE — Progress Notes (Signed)
2 Days Post-Op Subjective: NAEON. OOB to chair. Pain controlled. Tolerated regular diet without nausea/vomiting. Having flatus. Foley removed and voided.  Objective: Vital signs in last 24 hours: Temp:  [97.8 F (36.6 C)-98.7 F (37.1 C)] 98.4 F (36.9 C) (09/18 0325) Pulse Rate:  [88-126] 89 (09/18 0500) Resp:  [9-22] 18 (09/18 0500) BP: (113-142)/(51-93) 113/56 mmHg (09/18 0400) SpO2:  [93 %-100 %] 100 % (09/18 0500)  Intake/Output from previous day: 09/17 0701 - 09/18 0700 In: 3245 [P.O.:1120; I.V.:1725] Out: 1850 [Urine:1850] Intake/Output this shift: Total I/O In: 945 [P.O.:120; I.V.:825] Out: 1400 [Urine:1400]  Physical Exam:  General: Alert and oriented CV: RRR Lungs: Clear Abdomen: Soft, ND Incisions: c/d/i Foley: clear yellow Ext: NT, No erythema  Lab Results:  Recent Labs  03/22/14 1620 03/23/14 0401 03/24/14 0340  HGB 11.3*  11.5* 10.6* 10.0*  HCT 35.5*  36.4 34.1* 31.0*   BMET  Recent Labs  03/23/14 0401 03/24/14 0340  NA 133* 135*  K 4.2 3.9  CL 98 100  CO2 24 26  GLUCOSE 155* 116*  BUN 14 15  CREATININE 1.07 1.21*  CALCIUM 9.5 9.2     Studies/Results: Dg Chest Port 1 View  03/23/2014   CLINICAL DATA:  Atelectasis  EXAM: PORTABLE CHEST - 1 VIEW  COMPARISON:  03/22/2014  FINDINGS: Chronic interstitial markings. Pulmonary vascular congestion with possible very mild interstitial edema. Mild patchy opacity in the left lower lobe likely reflects atelectasis. Lingular scarring. Underlying emphysematous changes. No pleural effusion or pneumothorax.  Cardiomegaly.  IMPRESSION: Pulmonary vascular congestion with possible very mild interstitial edema.  Lingular scarring with left lower lobe atelectasis  Underlying emphysematous changes.   Electronically Signed   By: Julian Hy M.D.   On: 03/23/2014 07:48   Portable Chest Xray - Atelectasis  03/22/2014   CLINICAL DATA:  Tachycardia  EXAM: PORTABLE CHEST - 1 VIEW  COMPARISON:  08/12/2009,  02/07/2014  FINDINGS: Mild bilateral interstitial thickening. There is no focal parenchymal opacity, pleural effusion, or pneumothorax. The heart and mediastinal contours are unremarkable.  The osseous structures are unremarkable.  IMPRESSION: Mild bilateral interstitial thickening likely reflecting mild interstitial edema.   Electronically Signed   By: Kathreen Devoid   On: 03/22/2014 17:16    Assessment/Plan: POD#2 from robot assisted laparoscopic cytoreductive nephrectomy for presumed metastatic RCC. Doing well in step down unit after initial afib with RVR and hypotension post-op. Pulmonary critical care consulted.  N: Pain controlled with PO and IV pain meds. Continue pain meds. -- home lexapro  Pulm: History of COPD. Appreciate pulmonary CC recs -- pulmocort, spiriva, and levalbuterol inhalers -- was previously on home O2. Wean as tolerated  CV: History of Ischemic cardiomyopathy, afib, hyperlipidemia, and CAD with NSTEMI in 2011. Cardiology consulted, appreciate recs -- controlled with metoprolol (50mg  PO BID and 5mg  IV) -- home simvastatin  FEN/GI:  -- Medlocked -- Replete lytes PRN -- Regular diet -- bowel regimen  GU: adequate UOP. voiding  Endo: DM -- SSI  PPx: SCDs, consider chemoppx  Dispo: Ready for floor and d/c this pm from urology stand point if does well with ambulation and continues to do well this morning.    LOS: 2 days   Allison [name], Rishon Thilges C 03/24/2014, 6:47 AM

## 2014-03-24 NOTE — Progress Notes (Signed)
Patient Name: JESSE HIRST Date of Encounter: 03/24/2014     Active Problems:   Renal mass    SUBJECTIVE  No chest pain. No dyspnea. Anxious to go home today if possible. Rhythm atrial fibrillation with VR 105.  CURRENT MEDS . antiseptic oral rinse  7 mL Mouth Rinse BID  . aspirin  81 mg Oral Daily  . budesonide  0.25 mg Nebulization Q6H  . escitalopram  10 mg Oral QHS  . heparin subcutaneous  5,000 Units Subcutaneous 3 times per day  . insulin aspart  0-9 Units Subcutaneous TID WC  . levalbuterol  1.25 mg Nebulization Q6H  . metoprolol  75 mg Oral BID  . pantoprazole (PROTONIX) IV  40 mg Intravenous Q24H  . simvastatin  20 mg Oral QPM  . tiotropium  18 mcg Inhalation q morning - 10a    OBJECTIVE  Filed Vitals:   03/24/14 0300 03/24/14 0325 03/24/14 0400 03/24/14 0500  BP:   113/56   Pulse: 90  88 89  Temp:  98.4 F (36.9 C)    TempSrc:  Oral    Resp: 20  20 18   Height:      Weight:      SpO2: 99%  99% 100%    Intake/Output Summary (Last 24 hours) at 03/24/14 0747 Last data filed at 03/24/14 0600  Gross per 24 hour  Intake   3245 ml  Output   1850 ml  Net   1395 ml   Filed Weights   03/22/14 0636 03/22/14 1006 03/23/14 0400  Weight: 164 lb (74.39 kg) 164 lb (74.39 kg) 166 lb 0.1 oz (75.3 kg)    PHYSICAL EXAM  General: Pleasant, NAD. Neuro: Alert and oriented X 3. Moves all extremities spontaneously. Psych: Normal affect. HEENT:  Normal  Neck: Supple without bruits or JVD. Lungs:  Resp regular and unlabored, CTA. Heart: Irregular. no s3, s4, or murmurs. Abdomen: Soft, non-tender, non-distended, BS + x 4.  Extremities: No clubbing, cyanosis or edema. DP/PT/Radials 2+ and equal bilaterally.  Accessory Clinical Findings  CBC  Recent Labs  03/22/14 1620 03/23/14 0401 03/24/14 0340  WBC 19.2*  --  15.3*  NEUTROABS 18.1*  --   --   HGB 11.3*  11.5* 10.6* 10.0*  HCT 35.5*  36.4 34.1* 31.0*  MCV 88.3  --  86.4  PLT 406*  --  161   Basic  Metabolic Panel  Recent Labs  03/23/14 0401 03/24/14 0340  NA 133* 135*  K 4.2 3.9  CL 98 100  CO2 24 26  GLUCOSE 155* 116*  BUN 14 15  CREATININE 1.07 1.21*  CALCIUM 9.5 9.2  MG 1.9 1.9  PHOS 3.6 2.9   Liver Function Tests  Recent Labs  03/22/14 1620  AST 15  ALT 14  ALKPHOS 120*  BILITOT 0.3  PROT 7.5  ALBUMIN 3.0*   No results found for this basename: LIPASE, AMYLASE,  in the last 72 hours Cardiac Enzymes  Recent Labs  03/22/14 1620 03/23/14 0401  TROPONINI <0.30 <0.30   BNP No components found with this basename: POCBNP,  D-Dimer No results found for this basename: DDIMER,  in the last 72 hours Hemoglobin A1C No results found for this basename: HGBA1C,  in the last 72 hours Fasting Lipid Panel No results found for this basename: CHOL, HDL, LDLCALC, TRIG, CHOLHDL, LDLDIRECT,  in the last 72 hours Thyroid Function Tests  Recent Labs  03/22/14 1620  TSH 9.360*    TELE  Atrial fibrillation with controlled VR  ECG    Radiology/Studies  Dg Chest Port 1 View  03/23/2014   CLINICAL DATA:  Atelectasis  EXAM: PORTABLE CHEST - 1 VIEW  COMPARISON:  03/22/2014  FINDINGS: Chronic interstitial markings. Pulmonary vascular congestion with possible very mild interstitial edema. Mild patchy opacity in the left lower lobe likely reflects atelectasis. Lingular scarring. Underlying emphysematous changes. No pleural effusion or pneumothorax.  Cardiomegaly.  IMPRESSION: Pulmonary vascular congestion with possible very mild interstitial edema.  Lingular scarring with left lower lobe atelectasis  Underlying emphysematous changes.   Electronically Signed   By: Julian Hy M.D.   On: 03/23/2014 07:48   Portable Chest Xray - Atelectasis  03/22/2014   CLINICAL DATA:  Tachycardia  EXAM: PORTABLE CHEST - 1 VIEW  COMPARISON:  08/12/2009, 02/07/2014  FINDINGS: Mild bilateral interstitial thickening. There is no focal parenchymal opacity, pleural effusion, or pneumothorax.  The heart and mediastinal contours are unremarkable.  The osseous structures are unremarkable.  IMPRESSION: Mild bilateral interstitial thickening likely reflecting mild interstitial edema.   Electronically Signed   By: Kathreen Devoid   On: 03/22/2014 17:16    ASSESSMENT AND PLAN 1. Chronic atrial fibrillation. Will restart baby aspirin now. Will plan to allow further recovery from surgery and start NOAC Eliquis 5 mg BID when she returns for office in 1 week. Have increased lopressor back to home dose. 2. CAD: S/P PCI in 2011 with negative myoview in August. No chest pain despite rapid afib. Troponin nl. Cont bb, statin.  3. H/O Cardiomyopathy: Nl EF by echo in July. Watch volume. Resume BB.  4. L Renal cell carcinoma: S/p nephrectomy - per urology.  5. RLL Nodule: Outpt pulm f/u and repeat CT.  6. PAD: No complaints. Cont statin.  Plan: See cardiology in 1 week for office visit and to initiate Eliquis if stable.Resume lasix and potassium and statin at discharge. Will not resume plavix.  Signed, Darlin Coco MD

## 2014-03-28 ENCOUNTER — Telehealth: Payer: Self-pay | Admitting: Cardiovascular Disease

## 2014-03-28 MED ORDER — ASPIRIN EC 81 MG PO TBEC: 81.0000 mg | DELAYED_RELEASE_TABLET | Freq: Every day | ORAL | Status: DC

## 2014-03-28 NOTE — Telephone Encounter (Signed)
New message    Patient can start plavix on tomorrow. Please advise.

## 2014-03-28 NOTE — Telephone Encounter (Signed)
Spoke with Allison [name] who is asking if she should resume Plavix. Per Dr. Sherryl Barters note Allison [name] should not resume Plavix. She should take ASA 81 mg by mouth daily and be seen in office in 1 week to discuss starting Eliquis. I spoke with Allison [name] and gave her this information. She has not been taking ASA but will resume 81 mg today. Appt made for Allison [name] to see Melina Copa, PA on March 30, 2014 at 9:15.  (previously schedule post hospital appt with Richardson Dopp, PA for April 11, 2014 cancelled)

## 2014-03-30 ENCOUNTER — Telehealth: Payer: Self-pay | Admitting: *Deleted

## 2014-03-30 ENCOUNTER — Encounter: Payer: Self-pay | Admitting: Physician Assistant

## 2014-03-30 ENCOUNTER — Ambulatory Visit (INDEPENDENT_AMBULATORY_CARE_PROVIDER_SITE_OTHER): Payer: Medicaid Other | Admitting: Physician Assistant

## 2014-03-30 VITALS — BP 118/78 | HR 88 | Ht 67.0 in | Wt 160.8 lb

## 2014-03-30 DIAGNOSIS — C642 Malignant neoplasm of left kidney, except renal pelvis: Secondary | ICD-10-CM

## 2014-03-30 DIAGNOSIS — I255 Ischemic cardiomyopathy: Secondary | ICD-10-CM

## 2014-03-30 DIAGNOSIS — I2510 Atherosclerotic heart disease of native coronary artery without angina pectoris: Secondary | ICD-10-CM

## 2014-03-30 DIAGNOSIS — I4891 Unspecified atrial fibrillation: Secondary | ICD-10-CM

## 2014-03-30 DIAGNOSIS — R739 Hyperglycemia, unspecified: Secondary | ICD-10-CM

## 2014-03-30 DIAGNOSIS — I2589 Other forms of chronic ischemic heart disease: Secondary | ICD-10-CM

## 2014-03-30 DIAGNOSIS — R7309 Other abnormal glucose: Secondary | ICD-10-CM

## 2014-03-30 DIAGNOSIS — C649 Malignant neoplasm of unspecified kidney, except renal pelvis: Secondary | ICD-10-CM

## 2014-03-30 DIAGNOSIS — I4819 Other persistent atrial fibrillation: Secondary | ICD-10-CM

## 2014-03-30 LAB — BASIC METABOLIC PANEL
BUN: 15 mg/dL (ref 6–23)
CHLORIDE: 99 meq/L (ref 96–112)
CO2: 27 mEq/L (ref 19–32)
CREATININE: 1.2 mg/dL (ref 0.4–1.2)
Calcium: 9.4 mg/dL (ref 8.4–10.5)
GFR: 50.88 mL/min — ABNORMAL LOW (ref >60.00)
Glucose, Bld: 93 mg/dL (ref 70–99)
Potassium: 3.7 mEq/L (ref 3.5–5.1)
Sodium: 135 mEq/L (ref 135–145)

## 2014-03-30 LAB — CBC
HEMATOCRIT: 36.8 % (ref 36.0–46.0)
Hemoglobin: 11.9 g/dL — ABNORMAL LOW (ref 12.0–15.0)
MCHC: 32.4 g/dL (ref 30.0–36.0)
MCV: 85.5 fl (ref 78.0–100.0)
Platelets: 411 10*3/uL — ABNORMAL HIGH (ref 150.0–400.0)
RBC: 4.3 Mil/uL (ref 3.87–5.11)
RDW: 16.3 % — AB (ref 11.5–15.5)
WBC: 11.2 10*3/uL — AB (ref 4.0–10.5)

## 2014-03-30 LAB — HEMOGLOBIN A1C: Hgb A1c MFr Bld: 5.7 % (ref 4.6–6.5)

## 2014-03-30 LAB — T4, FREE: Free T4: 1.58 ng/dL (ref 0.60–1.60)

## 2014-03-30 LAB — TSH: TSH: 1 u[IU]/mL (ref 0.35–4.50)

## 2014-03-30 MED ORDER — APIXABAN 5 MG PO TABS
5.0000 mg | ORAL_TABLET | Freq: Two times a day (BID) | ORAL | Status: DC
Start: 2014-03-30 — End: 2014-05-02

## 2014-03-30 NOTE — Progress Notes (Addendum)
[place] [place], [place]  [address] Phone: [phone] Fax:  [phone]  Date:  03/30/2014   Patient ID:  Allison [name], Allison [name] 1950-11-26, MRN [id number]   PCP:  Michaell Cowing, MD  Cardiologist:  Dr. Angelena Form   History of Present Illness: Allison [name] is a 63 y.o. female with h/o CAD (s/p PCI/DES of the LCX in 2011, nonischemic MV 02/2014 for pre-op), PVD (LE PVD and nonobstructive carotid disease), COPD, ICM (with normalization of LV fxn), and recently dx'd PAF and RCC who presents for hospital follow-up. In July 2015 she presented to the hospital with abdominal pain/hematuria and found to be in afib which was able to be rate controlled. She had been on plavix chronically and that was initially held. Imaging revealed a new dx of L renal mass, later dx as renal cell carcinoma. She was also found to have scattered pulmonary nodules. Hematuria resolved. She was seen by both pulmonology and cardiology over the summer with negative nuc study and clearance for surgery/nephrectomy provided. When she was seen by Dr. Angelena Form, it was felt that she would eventually require initiation of a NOAC (CHADS2VA2Sc = 4). She was admitted 9/16-9/18 and underwent robotic assisted lap radical left nephrectomy. Post-operatively, she became tachycardic in the setting of baseline afib with rates into the 160's. She was initially given IV diltiazem but dropped her pressure. Both rate and BP were able to be stabilized. Dr. Mare Ferrari recommended to start low dose aspirin at discharge and consider starting Eliquis at today's f/u appointment if stable. I spoke with Dr. Tresa Moore today who is completely fine with initiation of Eliquis.  Of note, in the hospital, TSH was 9.36. She has not had this rechecked and denies history of thyroid issues. She had a mild post-op anemia related to surgery but denies any unusual bleeding. Cr bumped to 1.21 post-op. She sees Dr. Chase Caller and Dr. Tresa Moore again on 9/29. She denies awareness  of her AF. She feels well without CP, SOB, nausea, syncope, claudication, or further hematuria.   Recent Labs: 03/22/2014: ALT 14; TSH 9.360*  03/24/2014: Creatinine 1.21*; Hemoglobin 10.0*; Potassium 3.9  2D Echo 01/28/14 - Left ventricle: The cavity size was normal. Wall thickness was normal. Systolic function was normal. The estimated ejection fraction was in the range of 55% to 60%. Probable hypokinesis of the basal-midinferolateral and inferior myocardium. The study is not technically sufficient to allow evaluation of LV diastolic function. - Aortic valve: Mildly calcified annulus. Trileaflet; mildly calcified leaflets. There was no significant regurgitation. - Mitral valve: Calcified annulus. There was trivial regurgitation. - Left atrium: The atrium was moderately dilated. - Right atrium: The atrium was moderately dilated. Central venous pressure (est): 8 mm Hg. - Tricuspid valve: There was trivial regurgitation. - Pulmonary arteries: Systolic pressure could not be accurately estimated. - Pericardium, extracardiac: There was no pericardial effusion. Impressions: - Normal LV chamber size and wall thickness with LVEF 55-60%, probable hypokinesis of the mid to basal inferolateral wall. Indeterminate diastolic function in the setting of atrial fibrillation. Moderate biatrial enlargement. MAC with trivial mitral regurgitation. Unable to assess PASP.   Wt Readings from Last 3 Encounters:  03/30/14 160 lb 12.8 oz (72.938 kg)  03/23/14 166 lb 0.1 oz (75.3 kg)  03/23/14 166 lb 0.1 oz (75.3 kg)     Past Medical History  Diagnosis Date  . COPD (chronic obstructive pulmonary disease)   . Tobacco abuse     hx of  . Coronary artery disease  a. 08/2009 NSTEMI s/p PCI/DES to LCX. Mod residual LAD dzs;  b. 02/2014 Nonischemic myoview.  . Hyperlipidemia   . Ischemic cardiomyopathy     a. Echo 08/14/09 with LVEF 40-45%;  b. 01/2014 Echo: EF 55-60%, basal-mid inflat and inf HK, triv  MR/TR, mod dil LA/RA.  Marland Kitchen A-fib     a. Dx 01/2014 - rate controlled. b. Recurrence 03/2014 in setting of renal cell carcinoma surgery.  . 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 Acute pyelonephritis 01/28/2014  . Hypertension   . PVD (peripheral vascular disease)     a. 11/2010 L Fem to above knee Pop bypass;  b. 12/2011 Angio: patent L fem->pop graft, LCFZ 50-60%.  . Pneumonia     hx of  . Anxiety   . History of kidney stones   . Carotid disease, bilateral     a. 11/2011 U/S: RICA 66-44, LICA <03.  . Lung nodule     a. 1cm RLL nodule - followed by pulmonology.    Current Outpatient Prescriptions  Medication Sig Dispense Refill  . albuterol (PROVENTIL HFA;VENTOLIN HFA) 108 (90 BASE) MCG/ACT inhaler Inhale 2 puffs into the lungs every 6 (six) hours as needed for wheezing.  1 Inhaler  5   Aspirin 81mg  Take 1 tablet by mouth daily    . ALPRAZolam (XANAX) 0.5 MG tablet Take 1 tablet (0.5 mg total) by mouth daily as needed for anxiety or sleep.  20 tablet  0  . diphenhydramine-acetaminophen (TYLENOL PM) 25-500 MG TABS Take 1 tablet by mouth at bedtime as needed (for sleep).      Marland Kitchen escitalopram (LEXAPRO) 10 MG tablet Take 10 mg by mouth at bedtime.      . furosemide (LASIX) 20 MG tablet Take 20 mg by mouth every morning.      Marland Kitchen HYDROcodone-acetaminophen (NORCO) 5-325 MG per tablet Take 1-2 tablets by mouth every 6 (six) hours as needed.  30 tablet  0  . levalbuterol (XOPENEX) 0.63 MG/3ML nebulizer solution Take 0.63 mg by nebulization every 8 (eight) hours as needed for wheezing or shortness of breath.      . metoprolol (LOPRESSOR) 50 MG tablet Take 1.5 tablets (75 mg total) by mouth 2 (two) times daily.  90 tablet  6  . mometasone-formoterol (DULERA) 200-5 MCG/ACT AERO Take 2 puffs first thing in am and then another 2 puffs about 12 hours later.  1 Inhaler  11  . potassium chloride (K-DUR) 10 MEQ tablet Take 10 mEq by mouth every morning. With the lasix      . senna-docusate  (SENOKOT-S) 8.6-50 MG per tablet Take 1 tablet by mouth 2 (two) times daily. While taking pain meds to prevent constipation  30 tablet  1  . simvastatin (ZOCOR) 20 MG tablet Take 20 mg by mouth every evening.      . tiotropium (SPIRIVA) 18 MCG inhalation capsule Place 18 mcg into inhaler and inhale every morning.       No current facility-administered medications for this visit.    Allergies:   Ace inhibitors; Amoxicillin-pot clavulanate; and Codeine   Social History:  The patient  reports that she quit smoking about 4 years ago. Her smoking use included Cigarettes. She has a 45 pack-year smoking history. She has never used smokeless tobacco. She reports that she does not drink alcohol or use illicit drugs.   Family History:  The patient's family history includes Cancer in her father and mother; Cirrhosis in her father; Coronary artery disease in  her mother; Heart attack in her mother; Heart disease in her mother; Hyperlipidemia in her mother; Hypertension in her mother; Other in her brother.   ROS:  Please see the history of present illness.   All other systems reviewed and negative.   PHYSICAL EXAM:  VS:  BP 118/78  Pulse 88  Ht 5\' 7"  (1.702 m)  Wt 160 lb 12.8 oz (72.938 kg)  BMI 25.18 kg/m2 Well nourished, well developed WF, in no acute distress HEENT: normal Neck: no JVD Cardiac:  normal S1, S2; irregularly irregular; no murmur Lungs:  clear to auscultation bilaterally, no wheezing, rhonchi or rales Abd: soft, nontender, no hepatomegaly Ext: no edema Skin: warm and dry Neuro:  moves all extremities spontaneously, no focal abnormalities noted  EKG:  Coarse atrial fib 88bpm similar appearing to prior tracings without acute change     ASSESSMENT AND PLAN:  1. Persistent atrial fibrillation - diagnosed July 2015. Rate controlled. I got the OK from Dr. Tresa Moore to start Eliquis today. Ms. Roache and I discussed alteratives, as well as bleeding risk and the importance of monitoring for  such. She does not want to use Coumadin. Will start Eliquis 5mg  BID and d/c aspirin. Recheck BMET, CBC today to ensure stability since she had mild changes post-op. Will also recheck TSH and free T4 given abnormal TSH as inpatient to exclude hypothyroidism. Will also check A1c given inpatient hyperglycemia to fully complete CHADSVASC assessment (presently 4 for h/o CHF, HTN, vascular disease, female). She would be a good candidate for a chance at DCCV to restore NSR since this has not yet been attempted due to her renal workup. She sees Dr. Chase Caller and Dr. Tresa Moore in a few days - I have asked her to please discuss if any further procedures are necessary that would interrupt anticoagulation. If not, we can proceed with DCCV in 4 weeks. Importance of absolute compliance with anticoag reinforced - she says she is very good about taking all her meds as prescribed. Will plug her into med management pharmacist clinic for periodic monitoring while on NOAC. 2. CAD s/p PCI - nonischemic nuc this summer. No anginal sx. Continue statin, BB. I discussed with Dr. Burt Knack in Dr. Camillia Herter absence the issue of d/c'ing aspirin given her hx of CAD/PVD and he agrees that we can d/c aspirin while starting Eliquis. 3. Ischemic cardiomyopathy - improved EF by echo 01/2014. 4. Renal cell carcinoma s/p L nephrectomy with pulmonary nodules as well - f/u Dr. Chase Caller and Dr. Tresa Moore as planned.  Dispo: Will tentatively schedule a follow-up for 3-4 weeks with me or Dr. Angelena Form. If we decide to go ahead with DCCV, we may be able to just have her return for this, then f/u afterwards.  Signed, Melina Copa, PA-C  03/30/2014 9:56 AM

## 2014-03-30 NOTE — Patient Instructions (Addendum)
Your physician has recommended you make the following change in your medication:   1. Stop Aspirin.   2. Start Eliquis 5 mg 1 tablet twice a day.  Your physician recommends that you have lab work drawn today : CBC, BMET, TSH, A1C AND FREE T4  Your physician recommends that you schedule a follow-up appointment in: 3-4 weeks with Dr. Angelena Form or Melina Copa PA-C  Your physician recommends that you schedule a follow-up appointment in [facility]madin Clinic in 4 weeks: 1 month follow up for Eliquis  Please ask Dr. Chase Caller and Dr. Tresa Moore if any further procedures are planned. If not, will plan to cardiovert in about 4 weeks.

## 2014-03-30 NOTE — Telephone Encounter (Signed)
pt notified about lab results with verbal understanding  

## 2014-03-30 NOTE — Progress Notes (Signed)
Thanks Dayna.    

## 2014-04-04 ENCOUNTER — Encounter: Payer: Self-pay | Admitting: Internal Medicine

## 2014-04-04 ENCOUNTER — Ambulatory Visit (INDEPENDENT_AMBULATORY_CARE_PROVIDER_SITE_OTHER): Payer: Medicaid Other | Admitting: Internal Medicine

## 2014-04-04 VITALS — BP 132/72 | HR 95 | Ht 67.0 in | Wt 159.0 lb

## 2014-04-04 DIAGNOSIS — IMO0002 Reserved for concepts with insufficient information to code with codable children: Secondary | ICD-10-CM

## 2014-04-04 DIAGNOSIS — W5503XA Scratched by cat, initial encounter: Secondary | ICD-10-CM

## 2014-04-04 DIAGNOSIS — W64XXXA Exposure to other animate mechanical forces, initial encounter: Secondary | ICD-10-CM

## 2014-04-04 DIAGNOSIS — J449 Chronic obstructive pulmonary disease, unspecified: Secondary | ICD-10-CM

## 2014-04-04 DIAGNOSIS — R911 Solitary pulmonary nodule: Secondary | ICD-10-CM

## 2014-04-04 NOTE — Patient Instructions (Addendum)
#  COPD  - glad surgery nephrectomy went well from a pulmonary standpoint  - copd appears stable   - continue o2  - continue spiriva and dulera daily - glad you are uptodate with respiratory vaccines    #Lung nodule Right lower lobe 1cm on CT chest 01/28/14- presumed due to kidney cancer  - do repeat ct chest mid October pt 2015 for followup  - will call with results   #CAt scratch  - do not let the cat scartch you  - clean with soap and antiseptic  #Folllowup  With DR Cleda Imel in 4 months or sooner if needed

## 2014-04-04 NOTE — Progress Notes (Signed)
Subjective:    Patient ID: Allison [name], female    DOB: Nov 14, 1950, 63 y.o.   MRN: [id number]  HPI   #COPD  - gold stage 3 - MS phenotpe - medicaid refused rehab  #Smoking   reports that she quit smoking about 3 years ago. Her smoking use included Cigarettes. She has a 45 pack-year smoking history. She has never used smokeless tobacco.  #imgaging data - May 20-12: clear cxr. No CT data - Dec 2014: she is open for low dose CT but will save $  #Alpha 1   - MS  #Thrushs  - July 2014 on symbicort - dec 2014 on bre0   #Lung nodule 1cm RLL - 01/28/14 in setting of kidney cancer left side   #RENAL CELL CARCINOMA, 6.5 CM, EXTENDING INTO PROXIMAL RENAL VEIN. - LEFT SIDE, s/p nephrectomy 03/22/14 - MARGINS NOT INVOLVED. - ADRENAL GLAND FREE OF TUMO        OV 04/04/2014  Chief Complaint  Patient presents with  . Follow-up    Pt had left nephrectomy 2 weeks ago. Pt states she has intermitent DOE. Pt denies cough and CP/tightness. Pt states she is still wearing nocturnal O2.    OV   COPD: saw her last in august She is s.p nephretomy left side for renal cell CA 03/22/14. Post op course went well from pulmonary stand point. Some abd wound non healing being followed by Dr Tresa Moore. COPD istself is  Stable. COntinues with ? Nocturnal o2, daily scheduled spiriva and dulera. UPtodate with vaccines. Main issue is post op fatigue but she is improving from it  SMoking:  reports that she quit smoking about 4 years ago. Her smoking use included Cigarettes. She has a 45 pack-year smoking history. She has never used smokeless tobacco.   L#Lung nodule Right lower lobe 1cm on CT chest 01/28/14- presumed due to kidney cancer   - this was noted during renal cell CA workup. SHe needs fu CT now but wants to defer for a few weeks to help her get over fatigue   #NEw - exam shows scratch marks on forearms; due to Cat. She says she cleans with antiseptic. No evidence of infetion   Immunization  History  Administered Date(s) Administered  . Influenza Split 06/12/2013  . Influenza,inj,Quad PF,36+ Mos 03/23/2014  . Pneumococcal Polysaccharide-23 07/08/2007, 11/16/2012, 03/23/2014     Review of Systems  Constitutional: Negative for fever and unexpected weight change.  HENT: Negative for congestion, dental problem, ear pain, nosebleeds, postnasal drip, rhinorrhea, sinus pressure, sneezing, sore throat and trouble swallowing.   Eyes: Negative for redness and itching.  Respiratory: Positive for shortness of breath. Negative for cough, chest tightness and wheezing.   Cardiovascular: Negative for palpitations and leg swelling.  Gastrointestinal: Negative for nausea and vomiting.  Genitourinary: Negative for dysuria.  Musculoskeletal: Negative for joint swelling.  Skin: Negative for rash.  Neurological: Negative for headaches.  Hematological: Does not bruise/bleed easily.  Psychiatric/Behavioral: Negative for dysphoric mood. The patient is not nervous/anxious.    Current outpatient prescriptions:albuterol (PROVENTIL HFA;VENTOLIN HFA) 108 (90 BASE) MCG/ACT inhaler, Inhale 2 puffs into the lungs every 6 (six) hours as needed for wheezing., Disp: 1 Inhaler, Rfl: 5;  ALPRAZolam (XANAX) 0.5 MG tablet, Take 1 tablet (0.5 mg total) by mouth daily as needed for anxiety or sleep., Disp: 20 tablet, Rfl: 0 apixaban (ELIQUIS) 5 MG TABS tablet, Take 1 tablet (5 mg total) by mouth 2 (two) times daily., Disp: 60 tablet, Rfl: 6;  diphenhydramine-acetaminophen (TYLENOL PM) 25-500 MG TABS, Take 1 tablet by mouth at bedtime as needed (for sleep)., Disp: , Rfl: ;  escitalopram (LEXAPRO) 10 MG tablet, Take 10 mg by mouth at bedtime., Disp: , Rfl: ;  furosemide (LASIX) 20 MG tablet, Take 20 mg by mouth every morning., Disp: , Rfl:  HYDROcodone-acetaminophen (NORCO) 5-325 MG per tablet, Take 1-2 tablets by mouth every 6 (six) hours as needed., Disp: 30 tablet, Rfl: 0;  levalbuterol (XOPENEX) 0.63 MG/3ML nebulizer  solution, Take 0.63 mg by nebulization every 8 (eight) hours as needed for wheezing or shortness of breath., Disp: , Rfl: ;  metoprolol (LOPRESSOR) 50 MG tablet, Take 1.5 tablets (75 mg total) by mouth 2 (two) times daily., Disp: 90 tablet, Rfl: 6 mometasone-formoterol (DULERA) 200-5 MCG/ACT AERO, Take 2 puffs first thing in am and then another 2 puffs about 12 hours later., Disp: 1 Inhaler, Rfl: 11;  potassium chloride (K-DUR) 10 MEQ tablet, Take 10 mEq by mouth every morning. With the lasix, Disp: , Rfl: ;  senna-docusate (SENOKOT-S) 8.6-50 MG per tablet, Take 1 tablet by mouth 2 (two) times daily. While taking pain meds to prevent constipation, Disp: 30 tablet, Rfl: 1 simvastatin (ZOCOR) 20 MG tablet, Take 20 mg by mouth every evening., Disp: , Rfl: ;  tiotropium (SPIRIVA) 18 MCG inhalation capsule, Place 18 mcg into inhaler and inhale every morning., Disp: , Rfl:      Objective:   Physical Exam  Filed Vitals:   04/04/14 1513  BP: 132/72  Pulse: 95  Height: 5\' 7"  (1.702 m)  Weight: 159 lb (72.122 kg)  SpO2: 97%     tals reviewed. Constitutional: She is oriented to person, place, and time. She appears well-developed and well-nourished. No distress.  HENT:  Head: Normocephalic and atraumatic.  Right Ear: External ear normal.  Left Ear: External ear normal.  Mouth/Throat: Oropharynx is clear and moist. No oropharyngeal exudate.  o2 off Eyes: Conjunctivae and EOM are normal. Pupils are equal, round, and reactive to light. Right eye exhibits no discharge. Left eye exhibits no discharge. No scleral icterus.  Neck: Normal range of motion. Neck supple. No JVD present. No tracheal deviation present. No thyromegaly present.  Cardiovascular: Normal rate, regular rhythm, normal heart sounds and intact distal pulses.  Exam reveals no gallop and no friction rub.   No murmur heard. Pulmonary/Chest: Effort normal and breath sounds normal. No respiratory distress. She has no wheezes. She has no  rales. She exhibits no tenderness.  Mild overall decreased AE  Abdominal: Soft. Bowel sounds are normal. She exhibits no distension and no mass. There is no tenderness. There is no rebound and no guarding.  Musculoskeletal: Normal range of motion. She exhibits no edema and no tenderness.  Lymphadenopathy:    She has no cervical adenopathy.  Neurological: She is alert and oriented to person, place, and time. She has normal reflexes. No cranial nerve deficit. She exhibits normal muscle tone. Coordination normal.  Skin: Skin is warm and dry. No rash noted. She is not diaphoretic. No erythema. No pallor. exam shows scratch marks on forearms; due to Cat. She says she cleans with antiseptic. No evidence of infetion  Psychiatric: She has a normal mood and affect. Her behavior is normal. Judgment and thought content normal.      Assessment & Plan:  #COPD  - glad surgery nephrectomy went well from a pulmonary standpoint  - copd appears stable   - continue o2  - continue spiriva and dulera daily -  glad you are uptodate with respiratory vaccines    #Lung nodule Right lower lobe 1cm on CT chest 01/28/14- presumed due to kidney cancer  - do repeat ct chest mid October pt 2015 for followup  - will call with results   #CAt scratch  - do not let the cat scartch you  - clean with soap and antiseptic  #Folllowup  With DR Micael Barb in 4 months or sooner if needed

## 2014-04-11 ENCOUNTER — Ambulatory Visit: Payer: Medicaid Other | Admitting: Physician Assistant

## 2014-04-21 ENCOUNTER — Ambulatory Visit (INDEPENDENT_AMBULATORY_CARE_PROVIDER_SITE_OTHER): Payer: Medicaid Other | Admitting: Physician Assistant

## 2014-04-21 ENCOUNTER — Encounter: Payer: Self-pay | Admitting: Physician Assistant

## 2014-04-21 VITALS — BP 124/84 | HR 88 | Ht 67.0 in | Wt 161.8 lb

## 2014-04-21 DIAGNOSIS — N183 Chronic kidney disease, stage 3 unspecified: Secondary | ICD-10-CM

## 2014-04-21 DIAGNOSIS — I481 Persistent atrial fibrillation: Secondary | ICD-10-CM

## 2014-04-21 DIAGNOSIS — I10 Essential (primary) hypertension: Secondary | ICD-10-CM

## 2014-04-21 DIAGNOSIS — I4819 Other persistent atrial fibrillation: Secondary | ICD-10-CM

## 2014-04-21 DIAGNOSIS — C642 Malignant neoplasm of left kidney, except renal pelvis: Secondary | ICD-10-CM

## 2014-04-21 DIAGNOSIS — I2510 Atherosclerotic heart disease of native coronary artery without angina pectoris: Secondary | ICD-10-CM

## 2014-04-21 DIAGNOSIS — I255 Ischemic cardiomyopathy: Secondary | ICD-10-CM

## 2014-04-21 NOTE — Progress Notes (Signed)
[place], [place], [place]  [address] Phone: [phone] Fax:  [phone]  Date:  04/21/2014   Patient ID:  Allison, [name] Oct 06, 1950, MRN [id number]   PCP:  Michaell Cowing, MD  Cardiologist: Angelena Form   History of Present Illness: Allison [name] is a 63 y.o. 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 In July 2015 she presented to the hospital with abdominal pain/hematuria and found to be in afib which was able to be rate controlled. She had been on Plavix chronically and that was initially held. Imaging revealed a new dx of L renal mass, later dx as renal cell carcinoma. She was also found to have scattered pulmonary nodules. Hematuria resolved. She was seen by both pulmonology and cardiology over the summer with negative nuc study and clearance for surgery/nephrectomy provided. When she was seen by Dr. Angelena Form, it was felt that she would eventually require initiation of a NOAC (CHADS2VA2Sc = 4). She was admitted 9/16-9/18 and underwent robotic assisted lap radical left nephrectomy. Post-operatively, she became tachycardic in the setting of baseline afib with rates into the 160's. She was initially given IV diltiazem but dropped her pressure. Both rate and BP were able to be stabilized. At her follow-up on 03/30/14, we were given the green light by urology to start Eliquis. Dr. Mare Ferrari had recommended to d/c aspirin which was done. Recheck thyroid function was normal. Cr recheck was 1.2 which is likely her new baseline. The plan was to bring her back to discuss DCCV after 3 weeks of anticoagulation if no further biopsies were needed for her pulmonary issues given the need for uninterrupted anticoaguation.  Her pulmonologist  recommends a follow-up CT scan for her pulmonary nodules which is scheduled in a few days. We do not yet know if any biopsy or procedure will be indicated. She will be scheduled with oncology afterwards. She feels well without complaint. She denies any CP, SOB, nausea or anything acute. She is occasionally able to feel palpitations but is generally unaware of the AF. No bleeding on Eliquis.  Recent Labs: 03/22/2014: ALT 14  03/30/2014: Creatinine 1.2; Hemoglobin 11.9*; Potassium 3.7; TSH 1.00   Wt Readings from Last 3 Encounters:  04/21/14 161 lb 12.8 oz (73.392 kg)  04/04/14 159 lb (72.122 kg)  03/30/14 160 lb 12.8 oz (72.938 kg)     Past Medical History  Diagnosis Date  . COPD (chronic obstructive pulmonary disease)   . Tobacco abuse     hx of  . Coronary artery disease     a. 08/2009 NSTEMI s/p PCI/DES to LCX. Mod residual LAD dzs;  b. 02/2014 Nonischemic myoview.  . Hyperlipidemia   . Ischemic cardiomyopathy     a. Echo 08/14/09 with LVEF 40-45%;  b. 01/2014 Echo: EF 55-60%, basal-mid inflat and inf HK, triv MR/TR, mod dil LA/RA.  Marland Kitchen A-fib     a. Dx 01/2014 - rate controlled. b. Recurrence 03/2014 in setting of renal cell carcinoma surgery.  . 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 Acute pyelonephritis 01/28/2014  . Hypertension   . PVD (peripheral vascular disease)     a. 11/2010 L Fem to above knee Pop bypass;  b. 12/2011 Angio: patent L fem->pop graft, LCFZ  50-60%.  . Pneumonia     hx of  . Anxiety   . History of kidney stones   . Carotid disease, bilateral     a. 11/2011 U/S: RICA 62-70, LICA <35.  . Lung nodule     a. 1cm RLL nodule - followed by pulmonology.    Current Outpatient Prescriptions  Medication Sig Dispense Refill  . albuterol (PROVENTIL HFA;VENTOLIN HFA) 108 (90 BASE) MCG/ACT inhaler Inhale 2 puffs into the lungs every 6 (six) hours as needed for wheezing.  1 Inhaler  5  . ALPRAZolam (XANAX) 0.5 MG tablet Take 1 tablet (0.5 mg total) by mouth daily  as needed for anxiety or sleep.  20 tablet  0  . apixaban (ELIQUIS) 5 MG TABS tablet Take 1 tablet (5 mg total) by mouth 2 (two) times daily.  60 tablet  6  . diphenhydramine-acetaminophen (TYLENOL PM) 25-500 MG TABS Take 1 tablet by mouth at bedtime as needed (for sleep).      Marland Kitchen escitalopram (LEXAPRO) 10 MG tablet Take 10 mg by mouth at bedtime.      . furosemide (LASIX) 20 MG tablet Take 20 mg by mouth every morning.      Marland Kitchen HYDROcodone-acetaminophen (NORCO) 5-325 MG per tablet Take 1-2 tablets by mouth every 6 (six) hours as needed.  30 tablet  0  . levalbuterol (XOPENEX) 0.63 MG/3ML nebulizer solution Take 0.63 mg by nebulization every 8 (eight) hours as needed for wheezing or shortness of breath.      . metoprolol (LOPRESSOR) 50 MG tablet Take 1.5 tablets (75 mg total) by mouth 2 (two) times daily.  90 tablet  6  . mometasone-formoterol (DULERA) 200-5 MCG/ACT AERO Take 2 puffs first thing in am and then another 2 puffs about 12 hours later.  1 Inhaler  11  . potassium chloride (K-DUR) 10 MEQ tablet Take 10 mEq by mouth every morning. With the lasix      . senna-docusate (SENOKOT-S) 8.6-50 MG per tablet Take 1 tablet by mouth 2 (two) times daily. While taking pain meds to prevent constipation  30 tablet  1  . simvastatin (ZOCOR) 20 MG tablet Take 20 mg by mouth every evening.      . tiotropium (SPIRIVA) 18 MCG inhalation capsule Place 18 mcg into inhaler and inhale every morning.       No current facility-administered medications for this visit.    Allergies:   Ace inhibitors; Amoxicillin-pot clavulanate; and Codeine   Social History:  The patient  reports that she quit smoking about 4 years ago. Her smoking use included Cigarettes. She has a 45 pack-year smoking history. She has never used smokeless tobacco. She reports that she does not drink alcohol or use illicit drugs.   Family History:  The patient's family history includes Cancer in her father and mother; Cirrhosis in her father;  Coronary artery disease in her mother; Heart attack in her mother; Heart disease in her mother; Hyperlipidemia in her mother; Hypertension in her mother; Other in her brother.  ROS:  Please see the history of present illness.      All other systems reviewed and negative.   PHYSICAL EXAM:  VS:  BP 148/80 -recheck 124/84  Pulse 88  Ht 5\' 7"  (1.702 m)  Wt 161 lb 12.8 oz (73.392 kg)  BMI 25.34 kg/m2  Well nourished, well developed WF in no acute distress HEENT: normal Neck: no JVD Cardiac:  normal S1, S2; irregularly irregular, no murmur Lungs:  clear to auscultation  bilaterally, no wheezing, rhonchi or rales Abd: soft, nontender, no hepatomegaly Ext: no edema Skin: warm and dry Neuro:  moves all extremities spontaneously, no focal abnormalities noted  EKG:  Atrial fibrillation 88bpm,TWI III, otherwise no acute changes  ASSESSMENT AND PLAN:  1. Persistent AF - she remains relatively asymptomatic. I do still think it's worthwhile to pursue one shot at restoration NSR with DCCV. She has been on Eliquis for >3 weeks. However, her workup for pulmonary nodules is ongoing and it's not clear yet if she will require biopsy for these (that would require interruption in anticoagulation). As such, will await CT results. If she does not require any upcoming procedures, will arrange for DCCV. If this is unsuccessful, would likely continue a rate control strategy since she is fairly asymptomatic. She will continue current med regimen. Will not titrate BB further today due to COPD. She denies any bleeding on Eliquis. 2. Renal cell carcinoma s/p L nephrectomy with pulmonary nodules - she has an upcoming CT scan in several days to determine the next course of action for her nodules. We will need to await input from the pulmonologist regarding if a biopsy will be necessary. She will send me a MyChart message with what her pulmonologist recommends to her. 3. CAD s/p PCI - nonischemic nuc this summer. No anginal  sx. Continue statin, BB. Aspirin previously discontinued due to starting Eliquis. 4. Ischemic cardiomyopathy - improved EF by echo 01/2014. 5. 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 6. HTN - BP recheck WNL.  Dispo: F/u 3 months   Signed, Melina Copa, PA-C  04/21/2014 10:30 AM

## 2014-04-21 NOTE — Patient Instructions (Addendum)
Your physician recommends that you continue on your current medications as directed. Please refer to the Current Medication list given to you today.   Your physician recommends that you schedule a follow-up appointment in: 3 MONTHS   WITH DR [place] TO [place] ABOUT HOW CT SCAN WENT AND WHAT PULMONOLOGIST WANTS TO DO

## 2014-04-25 ENCOUNTER — Ambulatory Visit (INDEPENDENT_AMBULATORY_CARE_PROVIDER_SITE_OTHER)
Admission: RE | Admit: 2014-04-25 | Discharge: 2014-04-25 | Disposition: A | Discharge status: Home / Self Care | Payer: Medicaid Other | Source: Ambulatory Visit | Attending: Internal Medicine | Admitting: Internal Medicine

## 2014-04-25 DIAGNOSIS — R911 Solitary pulmonary nodule: Secondary | ICD-10-CM

## 2014-04-25 DIAGNOSIS — J449 Chronic obstructive pulmonary disease, unspecified: Secondary | ICD-10-CM

## 2014-04-28 ENCOUNTER — Ambulatory Visit (INDEPENDENT_AMBULATORY_CARE_PROVIDER_SITE_OTHER): Payer: Medicaid Other

## 2014-04-28 DIAGNOSIS — Z5181 Encounter for therapeutic drug level monitoring: Secondary | ICD-10-CM

## 2014-04-28 DIAGNOSIS — I4891 Unspecified atrial fibrillation: Secondary | ICD-10-CM

## 2014-04-28 LAB — CBC
HEMATOCRIT: 38.8 % (ref 36.0–46.0)
Hemoglobin: 12.8 g/dL (ref 12.0–15.0)
MCHC: 33 g/dL (ref 30.0–36.0)
MCV: 82.1 fl (ref 78.0–100.0)
Platelets: 250 10*3/uL (ref 150.0–400.0)
RBC: 4.73 Mil/uL (ref 3.87–5.11)
RDW: 18.9 % — ABNORMAL HIGH (ref 11.5–15.5)
WBC: 10.3 10*3/uL (ref 4.0–10.5)

## 2014-04-28 LAB — BASIC METABOLIC PANEL
BUN: 24 mg/dL — AB (ref 6–23)
CALCIUM: 9.3 mg/dL (ref 8.4–10.5)
CO2: 26 mEq/L (ref 19–32)
Chloride: 103 mEq/L (ref 96–112)
Creatinine, Ser: 1.2 mg/dL (ref 0.4–1.2)
GFR: 50.36 mL/min — ABNORMAL LOW (ref >60.00)
GLUCOSE: 91 mg/dL (ref 70–99)
POTASSIUM: 3.8 meq/L (ref 3.5–5.1)
SODIUM: 137 meq/L (ref 135–145)

## 2014-04-28 NOTE — Patient Instructions (Signed)

## 2014-04-28 NOTE — Progress Notes (Signed)
Pt was started on Eliquis 5mg  BID by Dr Angelena Form for afib on 03/30/14.    Reviewed patients medication list.  Pt is not currently on any combined P-gp and strong CYP3A4 inhibitors/inducers (ketoconazole, traconazole, ritonavir, carbamazepine, phenytoin, rifampin, St. John's wort).  Reviewed labs.  SCr 1.2, Weight 73kg, Age-63 y/o.  Dose appropriate based on CrCl.   Hgb and HCT Within Normal Limits 12.8/38.8 on 04/28/14.  Called spoke with pt advised to continue on Eliquis 5mg  BID, dosage is appropriate.  Recheck blood work in 6 mos.  Follow-up appt made in clinic for 10/23/13 at [facility]. Will continue to follow in [facility] given recent removal of 1 kidney.

## 2014-05-01 ENCOUNTER — Telehealth: Payer: Self-pay | Admitting: *Deleted

## 2014-05-01 ENCOUNTER — Encounter: Payer: Self-pay | Admitting: Physician Assistant

## 2014-05-01 NOTE — Telephone Encounter (Signed)
pt has been scheduled for DCCV 05/08/14 1 pm with Dr. Acie Fredrickson. Went over verbal instructions w/pt, will mail hard copy to pt today. Pt verbalized understanding to Plan of care.

## 2014-05-02 ENCOUNTER — Other Ambulatory Visit: Payer: Self-pay | Admitting: Internal Medicine

## 2014-05-02 ENCOUNTER — Encounter (HOSPITAL_COMMUNITY): Payer: Self-pay | Admitting: Pharmacy Technician

## 2014-05-02 DIAGNOSIS — R911 Solitary pulmonary nodule: Secondary | ICD-10-CM

## 2014-05-02 NOTE — Progress Notes (Signed)
Quick Note:  Called and spoke to pt. Informed pt of results and recs per MR. Pt verbalized understanding and denied any further questions or concerns at this time. Order placed for CT. ______

## 2014-05-03 ENCOUNTER — Telehealth: Payer: Self-pay | Admitting: Oncology

## 2014-05-04 NOTE — Telephone Encounter (Signed)
S/W PATIENT AND GAVE NP APPT FOR For 11/04 @ 10:30 W/DR. SHADAD REFERRING DR. Alexis Frock DX- SUSPECT METS RENAL CELL CARCINOMA

## 2014-05-05 NOTE — Progress Notes (Signed)
Quick Note:  Called and spoke to pt. Informed pt of results and recs per MR. Pt has CT already scheduled for 07/11/14. Pt verbalized understanding and denied any further questions or concerns at this time. ______

## 2014-05-08 ENCOUNTER — Ambulatory Visit (HOSPITAL_COMMUNITY)
Admission: RE | Admit: 2014-05-08 | Discharge: 2014-05-08 | Disposition: A | Discharge status: Home / Self Care | Payer: Medicaid Other | Source: Ambulatory Visit | Attending: Cardiovascular Disease | Admitting: Cardiovascular Disease

## 2014-05-08 ENCOUNTER — Ambulatory Visit (HOSPITAL_COMMUNITY): Payer: Medicaid Other | Admitting: Anesthesiology

## 2014-05-08 ENCOUNTER — Encounter (HOSPITAL_COMMUNITY)
Admission: RE | Disposition: A | Discharge status: Home / Self Care | Payer: Self-pay | Source: Ambulatory Visit | Attending: Cardiovascular Disease

## 2014-05-08 ENCOUNTER — Encounter (HOSPITAL_COMMUNITY): Payer: Self-pay | Admitting: Anesthesiology

## 2014-05-08 ENCOUNTER — Telehealth: Payer: Self-pay | Admitting: Oncology

## 2014-05-08 DIAGNOSIS — I4891 Unspecified atrial fibrillation: Secondary | ICD-10-CM

## 2014-05-08 DIAGNOSIS — J449 Chronic obstructive pulmonary disease, unspecified: Secondary | ICD-10-CM | POA: Insufficient documentation

## 2014-05-08 DIAGNOSIS — I6523 Occlusion and stenosis of bilateral carotid arteries: Secondary | ICD-10-CM | POA: Insufficient documentation

## 2014-05-08 DIAGNOSIS — I2510 Atherosclerotic heart disease of native coronary artery without angina pectoris: Secondary | ICD-10-CM | POA: Insufficient documentation

## 2014-05-08 DIAGNOSIS — Z8553 Personal history of malignant neoplasm of renal pelvis: Secondary | ICD-10-CM | POA: Insufficient documentation

## 2014-05-08 DIAGNOSIS — R918 Other nonspecific abnormal finding of lung field: Secondary | ICD-10-CM | POA: Insufficient documentation

## 2014-05-08 DIAGNOSIS — Z881 Allergy status to other antibiotic agents status: Secondary | ICD-10-CM | POA: Insufficient documentation

## 2014-05-08 DIAGNOSIS — Z888 Allergy status to other drugs, medicaments and biological substances status: Secondary | ICD-10-CM | POA: Insufficient documentation

## 2014-05-08 DIAGNOSIS — I255 Ischemic cardiomyopathy: Secondary | ICD-10-CM | POA: Insufficient documentation

## 2014-05-08 DIAGNOSIS — C7800 Secondary malignant neoplasm of unspecified lung: Secondary | ICD-10-CM | POA: Insufficient documentation

## 2014-05-08 DIAGNOSIS — I739 Peripheral vascular disease, unspecified: Secondary | ICD-10-CM | POA: Diagnosis not present

## 2014-05-08 DIAGNOSIS — Z885 Allergy status to narcotic agent status: Secondary | ICD-10-CM | POA: Insufficient documentation

## 2014-05-08 DIAGNOSIS — E785 Hyperlipidemia, unspecified: Secondary | ICD-10-CM | POA: Insufficient documentation

## 2014-05-08 HISTORY — DX: Reserved for inherently not codable concepts without codable children: IMO0001

## 2014-05-08 HISTORY — PX: CARDIOVERSION: SHX1299

## 2014-05-08 LAB — POCT I-STAT 4, (NA,K, GLUC, HGB,HCT)
Glucose, Bld: 96 mg/dL (ref 70–99)
HCT: 41 % (ref 36.0–46.0)
Hemoglobin: 13.9 g/dL (ref 12.0–15.0)
Potassium: 3.8 mEq/L (ref 3.7–5.3)
Sodium: 139 mEq/L (ref 137–147)

## 2014-05-08 SURGERY — CARDIOVERSION
Anesthesia: General

## 2014-05-08 MED ORDER — SODIUM CHLORIDE 0.9 % IV SOLN
INTRAVENOUS | Status: DC | PRN
  Administered 2014-05-08: 13:00:00 via INTRAVENOUS

## 2014-05-08 MED ORDER — PROPOFOL 10 MG/ML IV BOLUS
INTRAVENOUS | Status: DC | PRN
  Administered 2014-05-08: 30 mg via INTRAVENOUS
  Administered 2014-05-08: 40 mg via INTRAVENOUS

## 2014-05-08 MED ORDER — SODIUM CHLORIDE 0.9 % IV SOLN
INTRAVENOUS | Status: DC
  Administered 2014-05-08: 13:00:00 via INTRAVENOUS

## 2014-05-08 NOTE — Anesthesia Preprocedure Evaluation (Addendum)
Anesthesia Evaluation  Patient identified by MRN, date of birth, ID band Patient awake    Reviewed: Allergy & Precautions, H&P , Patient's Chart, lab work & pertinent test results  Airway Mallampati: II   Neck ROM: Full    Dental  (+) Dental Advisory Given   Pulmonary former smoker (quit 4 years ago),  breath sounds clear to auscultation        Cardiovascular hypertension, Pt. on medications + dysrhythmias Atrial Fibrillation Rhythm:Irregular  EF 60% 01/2014   Neuro/Psych Anxiety    GI/Hepatic   Endo/Other    Renal/GU GFR 50     Musculoskeletal   Abdominal (+)  Abdomen: soft.    Peds  Hematology   Anesthesia Other Findings   Reproductive/Obstetrics                            Anesthesia Physical Anesthesia Plan  ASA: III  Anesthesia Plan: General   Post-op Pain Management:    Induction: Intravenous  Airway Management Planned: Nasal Cannula  Additional Equipment:   Intra-op Plan:   Post-operative Plan:   Informed Consent: I have reviewed the patients History and Physical, chart, labs and discussed the procedure including the risks, benefits and alternatives for the proposed anesthesia with the patient or authorized representative who has indicated his/her understanding and acceptance.     Plan Discussed with:   Anesthesia Plan Comments:         Anesthesia Quick Evaluation

## 2014-05-08 NOTE — CV Procedure (Signed)
    Cardioversion Note  SHERREL PLOCH [id number] Apr 15, 1951  Procedure: DC Cardioversion Indications: atrial fibrillation   Procedure Details Consent: Obtained Time Out: Verified patient identification, verified procedure, site/side was marked, verified correct patient position, special equipment/implants available, Radiology Safety Procedures followed,  medications/allergies/relevent history reviewed, required imaging and test results available.  Performed  The patient has been on adequate anticoagulation.  The patient received Propofol 70 mg IV  for sedation.  Synchronous cardioversion was performed at 120, then 200  joules.  The cardioversion was successful    Complications: No apparent complications Patient did tolerate procedure well.   Thayer Headings, [name], MD, [facility] 05/08/2014, 12:50 PM

## 2014-05-08 NOTE — Anesthesia Postprocedure Evaluation (Signed)
  Anesthesia Post-op Note  Patient: Allison [name]  Procedure(s) Performed: Procedure(s): CARDIOVERSION (N/A)  Patient Location: PACU  Anesthesia Type:General  Level of Consciousness: awake and alert   Airway and Oxygen Therapy: Patient Spontanous Breathing and Patient connected to nasal cannula oxygen  Post-op Pain: none  Post-op Assessment: Post-op Vital signs reviewed  Post-op Vital Signs: Reviewed and stable  Last Vitals:  Filed Vitals:   05/08/14 1301  BP: 131/68  Pulse: 64  Temp: 36.7 C  Resp: 13    Complications: No apparent anesthesia complications

## 2014-05-08 NOTE — H&P (View-Only) (Signed)
[place], [place], [place]  [address] Phone: [phone] Fax:  [phone]  Date:  04/21/2014   Patient ID:  Allison [name], Allison [name] 1951/01/09, MRN [id number]   PCP:  Michaell Cowing, MD  Cardiologist: Angelena Form   History of Present Illness: Allison [name] is a 63 y.o. 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 In July 2015 she presented to the hospital with abdominal pain/hematuria and found to be in afib which was able to be rate controlled. She had been on Plavix chronically and that was initially held. Imaging revealed a new dx of L renal mass, later dx as renal cell carcinoma. She was also found to have scattered pulmonary nodules. Hematuria resolved. She was seen by both pulmonology and cardiology over the summer with negative nuc study and clearance for surgery/nephrectomy provided. When she was seen by Dr. Angelena Form, it was felt that she would eventually require initiation of a NOAC (CHADS2VA2Sc = 4). She was admitted 9/16-9/18 and underwent robotic assisted lap radical left nephrectomy. Post-operatively, she became tachycardic in the setting of baseline afib with rates into the 160's. She was initially given IV diltiazem but dropped her pressure. Both rate and BP were able to be stabilized. At her follow-up on 03/30/14, we were given the green light by urology to start Eliquis. Dr. Mare Ferrari had recommended to d/c aspirin which was done. Recheck thyroid function was normal. Cr recheck was 1.2 which is likely her new baseline. The plan was to bring her back to discuss DCCV after 3 weeks of anticoagulation if no further biopsies were needed for her pulmonary issues given the need for uninterrupted anticoaguation.  Her pulmonologist  recommends a follow-up CT scan for her pulmonary nodules which is scheduled in a few days. We do not yet know if any biopsy or procedure will be indicated. She will be scheduled with oncology afterwards. She feels well without complaint. She denies any CP, SOB, nausea or anything acute. She is occasionally able to feel palpitations but is generally unaware of the AF. No bleeding on Eliquis.  Recent Labs: 03/22/2014: ALT 14  03/30/2014: Creatinine 1.2; Hemoglobin 11.9*; Potassium 3.7; TSH 1.00   Wt Readings from Last 3 Encounters:  04/21/14 161 lb 12.8 oz (73.392 kg)  04/04/14 159 lb (72.122 kg)  03/30/14 160 lb 12.8 oz (72.938 kg)     Past Medical History  Diagnosis Date  . COPD (chronic obstructive pulmonary disease)   . Tobacco abuse     hx of  . Coronary artery disease     a. 08/2009 NSTEMI s/p PCI/DES to LCX. Mod residual LAD dzs;  b. 02/2014 Nonischemic myoview.  . Hyperlipidemia   . Ischemic cardiomyopathy     a. Echo 08/14/09 with LVEF 40-45%;  b. 01/2014 Echo: EF 55-60%, basal-mid inflat and inf HK, triv MR/TR, mod dil LA/RA.  Marland Kitchen A-fib     a. Dx 01/2014 - rate controlled. b. Recurrence 03/2014 in setting of renal cell carcinoma surgery.  . 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 Acute pyelonephritis 01/28/2014  . Hypertension   . PVD (peripheral vascular disease)     a. 11/2010 L Fem to above knee Pop bypass;  b. 12/2011 Angio: patent L fem->pop graft, LCFZ  50-60%.  . Pneumonia     hx of  . Anxiety   . History of kidney stones   . Carotid disease, bilateral     a. 11/2011 U/S: RICA 40-98, LICA <11.  . Lung nodule     a. 1cm RLL nodule - followed by pulmonology.    Current Outpatient Prescriptions  Medication Sig Dispense Refill  . albuterol (PROVENTIL HFA;VENTOLIN HFA) 108 (90 BASE) MCG/ACT inhaler Inhale 2 puffs into the lungs every 6 (six) hours as needed for wheezing.  1 Inhaler  5  . ALPRAZolam (XANAX) 0.5 MG tablet Take 1 tablet (0.5 mg total) by mouth daily  as needed for anxiety or sleep.  20 tablet  0  . apixaban (ELIQUIS) 5 MG TABS tablet Take 1 tablet (5 mg total) by mouth 2 (two) times daily.  60 tablet  6  . diphenhydramine-acetaminophen (TYLENOL PM) 25-500 MG TABS Take 1 tablet by mouth at bedtime as needed (for sleep).      Marland Kitchen escitalopram (LEXAPRO) 10 MG tablet Take 10 mg by mouth at bedtime.      . furosemide (LASIX) 20 MG tablet Take 20 mg by mouth every morning.      Marland Kitchen HYDROcodone-acetaminophen (NORCO) 5-325 MG per tablet Take 1-2 tablets by mouth every 6 (six) hours as needed.  30 tablet  0  . levalbuterol (XOPENEX) 0.63 MG/3ML nebulizer solution Take 0.63 mg by nebulization every 8 (eight) hours as needed for wheezing or shortness of breath.      . metoprolol (LOPRESSOR) 50 MG tablet Take 1.5 tablets (75 mg total) by mouth 2 (two) times daily.  90 tablet  6  . mometasone-formoterol (DULERA) 200-5 MCG/ACT AERO Take 2 puffs first thing in am and then another 2 puffs about 12 hours later.  1 Inhaler  11  . potassium chloride (K-DUR) 10 MEQ tablet Take 10 mEq by mouth every morning. With the lasix      . senna-docusate (SENOKOT-S) 8.6-50 MG per tablet Take 1 tablet by mouth 2 (two) times daily. While taking pain meds to prevent constipation  30 tablet  1  . simvastatin (ZOCOR) 20 MG tablet Take 20 mg by mouth every evening.      . tiotropium (SPIRIVA) 18 MCG inhalation capsule Place 18 mcg into inhaler and inhale every morning.       No current facility-administered medications for this visit.    Allergies:   Ace inhibitors; Amoxicillin-pot clavulanate; and Codeine   Social History:  The patient  reports that she quit smoking about 4 years ago. Her smoking use included Cigarettes. She has a 45 pack-year smoking history. She has never used smokeless tobacco. She reports that she does not drink alcohol or use illicit drugs.   Family History:  The patient's family history includes Cancer in her father and mother; Cirrhosis in her father;  Coronary artery disease in her mother; Heart attack in her mother; Heart disease in her mother; Hyperlipidemia in her mother; Hypertension in her mother; Other in her brother.  ROS:  Please see the history of present illness.      All other systems reviewed and negative.   PHYSICAL EXAM:  VS:  BP 148/80 -recheck 124/84  Pulse 88  Ht 5\' 7"  (1.702 m)  Wt 161 lb 12.8 oz (73.392 kg)  BMI 25.34 kg/m2  Well nourished, well developed WF in no acute distress HEENT: normal Neck: no JVD Cardiac:  normal S1, S2; irregularly irregular, no murmur Lungs:  clear to auscultation  bilaterally, no wheezing, rhonchi or rales Abd: soft, nontender, no hepatomegaly Ext: no edema Skin: warm and dry Neuro:  moves all extremities spontaneously, no focal abnormalities noted  EKG:  Atrial fibrillation 88bpm,TWI III, otherwise no acute changes  ASSESSMENT AND PLAN:  1. Persistent AF - she remains relatively asymptomatic. I do still think it's worthwhile to pursue one shot at restoration NSR with DCCV. She has been on Eliquis for >3 weeks. However, her workup for pulmonary nodules is ongoing and it's not clear yet if she will require biopsy for these (that would require interruption in anticoagulation). As such, will await CT results. If she does not require any upcoming procedures, will arrange for DCCV. If this is unsuccessful, would likely continue a rate control strategy since she is fairly asymptomatic. She will continue current med regimen. Will not titrate BB further today due to COPD. She denies any bleeding on Eliquis. 2. Renal cell carcinoma s/p L nephrectomy with pulmonary nodules - she has an upcoming CT scan in several days to determine the next course of action for her nodules. We will need to await input from the pulmonologist regarding if a biopsy will be necessary. She will send me a MyChart message with what her pulmonologist recommends to her. 3. CAD s/p PCI - nonischemic nuc this summer. No anginal  sx. Continue statin, BB. Aspirin previously discontinued due to starting Eliquis. 4. Ischemic cardiomyopathy - improved EF by echo 01/2014. 5. 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 6. HTN - BP recheck WNL.  Dispo: F/u 3 months   Signed, Melina Copa, PA-C  04/21/2014 10:30 AM

## 2014-05-08 NOTE — Telephone Encounter (Signed)
C/D FOR 11/04 FOR NP APPT ON 11/04

## 2014-05-08 NOTE — Transfer of Care (Signed)
Immediate Anesthesia Transfer of Care Note  Patient: Allison [name]  Procedure(s) Performed: Procedure(s): CARDIOVERSION (N/A)  Patient Location: PACU and Endoscopy Unit  Anesthesia Type:MAC  Level of Consciousness: awake, alert  and oriented  Airway & Oxygen Therapy: Patient Spontanous Breathing and Patient connected to nasal cannula oxygen  Post-op Assessment: Report given to PACU RN and Post -op Vital signs reviewed and stable  Post vital signs: Reviewed and stable  Complications: No apparent anesthesia complications

## 2014-05-08 NOTE — Interval H&P Note (Signed)
History and Physical Interval Note:  05/08/2014 11:59 AM  Allison [name]  has presented today for surgery, with the diagnosis of A FIB  The various methods of treatment have been discussed with the patient and family. After consideration of risks, benefits and other options for treatment, the patient has consented to  Procedure(s): CARDIOVERSION (N/A) as a surgical intervention .  The patient's history has been reviewed, patient examined, no change in status, stable for surgery.  I have reviewed the patient's chart and labs.  Questions were answered to the patient's satisfaction.     [name].

## 2014-05-08 NOTE — Discharge Instructions (Signed)
Electrical Cardioversion °Electrical cardioversion is the delivery of a jolt of electricity to change the rhythm of the heart. Sticky patches or metal paddles are placed on the chest to deliver the electricity from a device. This is done to restore a normal rhythm. A rhythm that is too fast or not regular keeps the heart from pumping well. °Electrical cardioversion is done in an emergency if:  °· There is low or no blood pressure as a result of the heart rhythm.   °· Normal rhythm must be restored as fast as possible to protect the brain and heart from further damage.   °· It may save a life. °Cardioversion may be done for heart rhythms that are not immediately life threatening, such as atrial fibrillation or flutter, in which:  °· The heart is beating too fast or is not regular.   °· Medicine to change the rhythm has not worked.   °· It is safe to wait in order to allow time for preparation. °· Symptoms of the abnormal rhythm are bothersome. °· The risk of stroke and other serious problems can be reduced. °LET YOUR HEALTH CARE PROVIDER KNOW ABOUT:  °· Any allergies you have. °· All medicines you are taking, including vitamins, herbs, eye drops, creams, and over-the-counter medicines. °· Previous problems you or members of your family have had with the use of anesthetics.   °· Any blood disorders you have.   °· Previous surgeries you have had.   °· Medical conditions you have. °RISKS AND COMPLICATIONS  °Generally, this is a safe procedure. However, problems can occur and include:  °· Breathing problems related to the anesthetic used. °· A blood clot that breaks free and travels to other parts of your body. This could cause a stroke or other problems. The risk of this is lowered by use of blood-thinning medicine (anticoagulant) prior to the procedure. °· Cardiac arrest (rare). °BEFORE THE PROCEDURE  °· You may have tests to detect blood clots in your heart and to evaluate heart function.  °· You may start taking  anticoagulants so your blood does not clot as easily.   °· Medicines may be given to help stabilize your heart rate and rhythm. °PROCEDURE °· You will be given medicine through an IV tube to reduce discomfort and make you sleepy (sedative).   °· An electrical shock will be delivered. °AFTER THE PROCEDURE °Your heart rhythm will be watched to make sure it does not change.  °Document Released: 06/13/2002 Document Revised: 11/07/2013 Document Reviewed: 01/05/2013 °[employer]® Patient Information ©2015 [employer]. This information is not intended to replace advice given to you by your health care provider. Make sure you discuss any questions you have with your health care provider. ° °

## 2014-05-09 ENCOUNTER — Encounter (HOSPITAL_COMMUNITY): Payer: Self-pay | Admitting: Cardiovascular Disease

## 2014-05-10 ENCOUNTER — Emergency Department (HOSPITAL_COMMUNITY): Payer: Medicaid Other

## 2014-05-10 ENCOUNTER — Ambulatory Visit: Payer: Medicaid Other

## 2014-05-10 ENCOUNTER — Encounter: Payer: Self-pay | Admitting: Oncology

## 2014-05-10 ENCOUNTER — Encounter (HOSPITAL_COMMUNITY): Payer: Self-pay | Admitting: Emergency Medicine

## 2014-05-10 ENCOUNTER — Telehealth: Payer: Self-pay | Admitting: Oncology

## 2014-05-10 ENCOUNTER — Ambulatory Visit (HOSPITAL_BASED_OUTPATIENT_CLINIC_OR_DEPARTMENT_OTHER): Payer: Medicaid Other | Admitting: Oncology

## 2014-05-10 ENCOUNTER — Emergency Department (HOSPITAL_COMMUNITY)
Admission: EM | Admit: 2014-05-10 | Discharge: 2014-05-11 | Disposition: A | Discharge status: Home / Self Care | Payer: Medicaid Other | Attending: Emergency Medicine | Admitting: Emergency Medicine

## 2014-05-10 ENCOUNTER — Other Ambulatory Visit: Payer: Self-pay

## 2014-05-10 ENCOUNTER — Other Ambulatory Visit: Payer: Medicaid Other

## 2014-05-10 VITALS — BP 152/64 | HR 68 | Temp 98.1°F | Resp 18 | Ht 67.0 in | Wt 167.5 lb

## 2014-05-10 DIAGNOSIS — Z79899 Other long term (current) drug therapy: Secondary | ICD-10-CM | POA: Diagnosis not present

## 2014-05-10 DIAGNOSIS — Z9861 Coronary angioplasty status: Secondary | ICD-10-CM | POA: Insufficient documentation

## 2014-05-10 DIAGNOSIS — M549 Dorsalgia, unspecified: Secondary | ICD-10-CM | POA: Insufficient documentation

## 2014-05-10 DIAGNOSIS — Z87442 Personal history of urinary calculi: Secondary | ICD-10-CM | POA: Diagnosis not present

## 2014-05-10 DIAGNOSIS — Z87448 Personal history of other diseases of urinary system: Secondary | ICD-10-CM | POA: Diagnosis not present

## 2014-05-10 DIAGNOSIS — J441 Chronic obstructive pulmonary disease with (acute) exacerbation: Secondary | ICD-10-CM | POA: Insufficient documentation

## 2014-05-10 DIAGNOSIS — Z87891 Personal history of nicotine dependence: Secondary | ICD-10-CM | POA: Diagnosis not present

## 2014-05-10 DIAGNOSIS — Z7902 Long term (current) use of antithrombotics/antiplatelets: Secondary | ICD-10-CM | POA: Insufficient documentation

## 2014-05-10 DIAGNOSIS — I2510 Atherosclerotic heart disease of native coronary artery without angina pectoris: Secondary | ICD-10-CM | POA: Diagnosis not present

## 2014-05-10 DIAGNOSIS — Z85118 Personal history of other malignant neoplasm of bronchus and lung: Secondary | ICD-10-CM | POA: Insufficient documentation

## 2014-05-10 DIAGNOSIS — C649 Malignant neoplasm of unspecified kidney, except renal pelvis: Secondary | ICD-10-CM

## 2014-05-10 DIAGNOSIS — E785 Hyperlipidemia, unspecified: Secondary | ICD-10-CM | POA: Insufficient documentation

## 2014-05-10 DIAGNOSIS — R918 Other nonspecific abnormal finding of lung field: Secondary | ICD-10-CM

## 2014-05-10 DIAGNOSIS — C642 Malignant neoplasm of left kidney, except renal pelvis: Secondary | ICD-10-CM

## 2014-05-10 DIAGNOSIS — Z8701 Personal history of pneumonia (recurrent): Secondary | ICD-10-CM | POA: Diagnosis not present

## 2014-05-10 DIAGNOSIS — I10 Essential (primary) hypertension: Secondary | ICD-10-CM | POA: Diagnosis not present

## 2014-05-10 DIAGNOSIS — IMO0001 Reserved for inherently not codable concepts without codable children: Secondary | ICD-10-CM

## 2014-05-10 DIAGNOSIS — R0602 Shortness of breath: Secondary | ICD-10-CM | POA: Diagnosis present

## 2014-05-10 DIAGNOSIS — C7800 Secondary malignant neoplasm of unspecified lung: Principal | ICD-10-CM

## 2014-05-10 DIAGNOSIS — Z9889 Other specified postprocedural states: Secondary | ICD-10-CM | POA: Insufficient documentation

## 2014-05-10 LAB — BASIC METABOLIC PANEL
Anion gap: 13 (ref 5–15)
BUN: 16 mg/dL (ref 6–23)
CHLORIDE: 97 meq/L (ref 96–112)
CO2: 23 meq/L (ref 19–32)
CREATININE: 1.02 mg/dL (ref 0.50–1.10)
Calcium: 9.5 mg/dL (ref 8.4–10.5)
GFR calc Af Amer: 67 mL/min — ABNORMAL LOW (ref >90)
GFR calc non Af Amer: 58 mL/min — ABNORMAL LOW (ref >90)
GLUCOSE: 108 mg/dL — AB (ref 70–99)
Potassium: 3.5 mEq/L — ABNORMAL LOW (ref 3.7–5.3)
Sodium: 133 mEq/L — ABNORMAL LOW (ref 137–147)

## 2014-05-10 LAB — I-STAT TROPONIN, ED: Troponin i, poc: 0 ng/mL (ref 0.00–0.08)

## 2014-05-10 LAB — CBC
HEMATOCRIT: 35.2 % — AB (ref 36.0–46.0)
HEMOGLOBIN: 11.4 g/dL — AB (ref 12.0–15.0)
MCH: 27.4 pg (ref 26.0–34.0)
MCHC: 32.4 g/dL (ref 30.0–36.0)
MCV: 84.6 fL (ref 78.0–100.0)
Platelets: 237 10*3/uL (ref 150–400)
RBC: 4.16 MIL/uL (ref 3.87–5.11)
RDW: 17.7 % — ABNORMAL HIGH (ref 11.5–15.5)
WBC: 17 10*3/uL — ABNORMAL HIGH (ref 4.0–10.5)

## 2014-05-10 NOTE — Progress Notes (Signed)
Checked in new pt with no financial concerns at this time.  Pt has Medicaid so financial assistance may not be needed but she has Raquel's card for any questions or concerns.

## 2014-05-10 NOTE — Progress Notes (Signed)
Please see consult note.  

## 2014-05-10 NOTE — Consult Note (Signed)
Reason for Referral: Renal cell carcinoma.  HPI: 63 year old woman native of [place] but currently residing in [place]. She has a past medical history significant for coronary artery disease as well as COPD. She was in her usual state of health until she started developing hematuria in December 2014. She subsequently was treated with antibiotics for urinary tract infection but her symptoms persist. In July 2015 she presented with weakness fatigue and tiredness and imaging studies including CT scan of the abdomen and pelvis showed a 6.4 x 5.4 x 5.9 mass in the anterior aspect of the left kidney suspicious for lung malignancy. She also had a CT scan of the chest on 07/25/2015which showed at least 3 pulmonary nodules in the right lower lobe and the inferior left upper lobe that is suggestive of metastatic disease.the measured somewhere between 1.3 and 1.7 cm nodules.on 03/22/2014 she underwent a robotic assisted laparoscopic radical left nephrectomy which she tolerated very well. The final pathology showed renal cell carcinoma, measuring 6.5 cm extending into the proximal renal vein. Margins not involved and the adrenal gland is free of tumor. The pathological staging was T3a without any lymph node sampled. Postoperatively, patient did well she did develop atrial fibrillation leading up to her surgery. She did undergo cardioversion and currently in normal sinus rhythm. She did not have any major postoperative complications.  Clinically, she feels very well. She does not report any headaches blurred vision or double vision or syncope. She does not report any fevers, chills, sweats or weight loss. She does not report any shortness of breath, cough or hemoptysis. She does report exertional dyspnea which is close to her baseline. She does not report any chest pain, palpitation, orthopnea, PND or leg edema. She does not report any nausea, vomiting, obstipation, diarrhea, hematochezia or melena. She  does not report any hematuria, dysuria or frequency. She does not report any skeletal complaints such as arthralgias or myalgias or joint swelling. She does not report any lymphadenopathy or petechiae. She continues to be active and attends to activities of daily living. She have regained most of her function at this time. Rest of her review of systems unremarkable.  Past Medical History  Diagnosis Date  . COPD (chronic obstructive pulmonary disease)   . Tobacco abuse     hx of  . Coronary artery disease     a. 08/2009 NSTEMI s/p PCI/DES to LCX. Mod residual LAD dzs;  b. 02/2014 Nonischemic myoview.  . Hyperlipidemia   . Ischemic cardiomyopathy     a. Echo 08/14/09 with LVEF 40-45%;  b. 01/2014 Echo: EF 55-60%, basal-mid inflat and inf HK, triv MR/TR, mod dil LA/RA.  Marland Kitchen A-fib     a. Dx 01/2014 - rate controlled. b. Recurrence 03/2014 in setting of renal cell carcinoma surgery.  . 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 Acute pyelonephritis 01/28/2014  . Hypertension   . PVD (peripheral vascular disease)     a. 11/2010 L Fem to above knee Pop bypass;  b. 12/2011 Angio: patent L fem->pop graft, LCFZ 50-60%.  . Pneumonia     hx of  . Anxiety   . History of kidney stones   . Carotid disease, bilateral     a. 11/2011 U/S: RICA 95-09, LICA <32.  . Lung nodule     a. 1cm RLL nodule - followed by pulmonology.  . Shortness of breath dyspnea   :  Past Surgical History  Procedure Laterality Date  . Coronary stent placement  2011  . Femoral bypass Left May 2012  . Lower extremity angiogram  December 16, 2011  . Cardiac catheterization    . Tonsillectomy  63 years old  . Robot assisted laparoscopic nephrectomy Left 03/22/2014    Procedure: ROBOTIC ASSISTED LAPAROSCOPIC RADICAL NEPHRECTOMY LEFT;  Surgeon: Alexis Frock, MD;  Location: [facility] ORS;  Service: Urology;  Laterality: Left;  . Cardioversion N/A 05/08/2014    Procedure: CARDIOVERSION;  Surgeon: Thayer Headings, MD;  Location: [facility] ENDOSCOPY;   Service: Cardiovascular;  Laterality: N/A;  :  Current Outpatient Prescriptions  Medication Sig Dispense Refill  . acetaminophen (TYLENOL) 325 MG tablet Take 650 mg by mouth daily as needed.    Marland Kitchen albuterol (PROVENTIL HFA;VENTOLIN HFA) 108 (90 BASE) MCG/ACT inhaler Inhale 2 puffs into the lungs every 6 (six) hours as needed for wheezing or shortness of breath.    Marland Kitchen apixaban (ELIQUIS) 5 MG TABS tablet Take 5 mg by mouth 2 (two) times daily.    . diphenhydramine-acetaminophen (TYLENOL PM) 25-500 MG TABS Take 2 tablets by mouth daily as needed (for sleep).     Marland Kitchen escitalopram (LEXAPRO) 10 MG tablet Take 10 mg by mouth at bedtime.    . furosemide (LASIX) 20 MG tablet Take 20 mg by mouth every morning.    . metoprolol (LOPRESSOR) 50 MG tablet Take 1.5 tablets (75 mg total) by mouth 2 (two) times daily. 90 tablet 6  . mometasone-formoterol (DULERA) 200-5 MCG/ACT AERO Inhale 2 puffs into the lungs daily. Take 2 puffs first thing in am and then another 2 puffs about 12 hours later.    . potassium chloride (K-DUR) 10 MEQ tablet Take 10 mEq by mouth every morning. With the lasix    . simvastatin (ZOCOR) 20 MG tablet Take 20 mg by mouth every evening.    . tiotropium (SPIRIVA) 18 MCG inhalation capsule Place 18 mcg into inhaler and inhale every morning.     No current facility-administered medications for this visit.       Allergies  Allergen Reactions  . Ace Inhibitors Cough  . Amoxicillin-Pot Clavulanate Hives  . Codeine Nausea And Vomiting  :  Family History  Problem Relation Age of Onset  . Coronary artery disease Mother     deceased. coronary artery bypass graft  . Cancer Mother     BREAST  . Heart disease Mother     PVD  - Amputation-Leg  . Hyperlipidemia Mother   . Hypertension Mother   . Heart attack Mother   . Cirrhosis Father     deceased  . Cancer Father   . Other Brother     alive, unknown hx  :  History   Social History  . Marital Status: Married    Spouse Name:  N/A    Number of Children: 3  . Years of Education: N/A   Occupational History  . sculptor    Social History Main Topics  . Smoking status: Former Smoker -- 1.50 packs/day for 30 years    Types: Cigarettes    Quit date: 08/12/2009  . Smokeless tobacco: Never Used     Comment: she has smoker 1-2 packs daily since the age of 65. 10 pack years  . Alcohol Use: No  . Drug Use: No  . Sexual Activity: Not on file   Other Topics Concern  . Not on file   Social History Narrative  :  Pertinent items are noted in HPI.  Exam: ECOG 1 Blood pressure 152/64, pulse 68, temperature  98.1 F (36.7 C), temperature source Oral, resp. rate 18, height 5\' 7"  (1.702 m), weight 167 lb 8 oz (75.978 kg), SpO2 98 %. General appearance: alert and cooperative Head: Normocephalic, without obvious abnormality Throat: lips, mucosa, and tongue normal; teeth and gums normal Neck: no adenopathy Back: negative Resp: clear to auscultation bilaterally Chest wall: no tenderness Cardio: regular rate and rhythm, S1, S2 normal, no murmur, click, rub or gallop GI: soft, non-tender; bowel sounds normal; no masses,  no organomegaly Extremities: extremities normal, atraumatic, no cyanosis or edema Pulses: 2+ and symmetric Skin: Skin color, texture, turgor normal. No rashes or lesions Lymph nodes: Cervical, supraclavicular, and axillary nodes normal.   Recent Labs  05/08/14 1234  HGB 13.9  HCT 41.0    Recent Labs  05/08/14 1234  NA 139  K 3.8  GLUCOSE 96       Ct Chest Wo Contrast  04/25/2014   CLINICAL DATA:  History of severe COPD. Right lung nodule, follow-up. Recent left nephrectomy for renal cell carcinoma. Smoker.  EXAM: CT CHEST WITHOUT CONTRAST  TECHNIQUE: Multidetector CT imaging of the chest was performed following the standard protocol without IV contrast.  COMPARISON:  01/28/2014  FINDINGS: Emphysematous changes noted throughout the lungs. Multiple bilateral pulmonary nodules. Nodule in the  right lower lobe just above the right hemidiaphragm laterally measures 15 mm in greatest diameter compared with 17 mm previously. Peripheral nodule posteriorly in the left upper lobe on image 25 measures up to 17 mm compared with 15 mm previously. 5 mm nodule in the left upper lobe/apex on image 12 is stable. The previously seen 2-3 mm right middle lobe nodule peripherally is no longer visualized. Posterior right lower lobe nodule on image 42 measures 5 mm, better seen on today's study as this was partially obscured previously, but is stable.  No new or enlarging pulmonary nodules.  Heart is normal size. Aorta is normal caliber. Densely calcified coronary arteries and aorta. No aneurysm. No mediastinal, hilar, or axillary adenopathy. Chest wall soft tissues are unremarkable. Imaging into the upper abdomen shows no acute findings.  No acute bony abnormality or focal bone lesion.  IMPRESSION: Numerous bilateral pulmonary nodules, not significantly changed since prior study.  COPD.  Densely calcified coronary arteries and aorta.   Electronically Signed   By: Rolm Baptise M.D.   On: 04/25/2014 12:47    Assessment and Plan:   63 year old woman with the following issues:  1. Renal cell carcinoma diagnosed in July 2015. She presented with a 6.4 cm left kidney mass suspicious for malignancy. She is status post left nephrectomy done in September 2015 without any major complications. The pathology revealed a T3a lesion without any local lymphadenopathy. She does have bilateral pulmonary nodules that are suspicious for malignancy. The natural course of this disease was discussed with the patient today extensively. Her CT scan results from 04/25/2014 were reviewed which showed really no major changes compared to prior studies. Given these findings, I see no urgent need to start any systemic therapy at this time especially if there is no clear-cut evidence of metastatic disease. The lung nodules are certainly suspicious  at this time but given their stability I see no need to intervene.  I do agree with repeat imaging studies which she is scheduled for in January 2016. If her CT scan showed progression of these nodules, we will discuss starting systemic therapy. These options were discussed today including tyrosine kinase inhibitors, mTOR inhibitors and high dose IL-2. She might not  be a excellent candidate for IL-2 but certainly would be candidate for any other systemic therapy. But for the time being, we'll continue with observation and surveillance and await the results of her CT scan.  2. Bilateral pulmonary nodules: The differential diagnosis including other possibilities as well such as infectious or inflammatory etiologies. Certainly, malignancy has to be the #1 concern moving forward.  All her questions were answered today to her satisfaction.

## 2014-05-10 NOTE — Telephone Encounter (Signed)
gv and printed appt sched and avs for pt for Jan 2016 °

## 2014-05-10 NOTE — ED Provider Notes (Signed)
CSN: [id number]     Arrival date & time 05/10/14  2207 History   First MD Initiated Contact with Patient 05/10/14 2243     Chief Complaint  Patient presents with  . Shortness of Breath  . Back Pain   Patient is a 63 y.o. female presenting with shortness of breath and back pain. The history is provided by the patient. No language interpreter was used.  Shortness of Breath Severity:  Moderate Onset quality:  Gradual Duration:  3 days Timing:  Constant Progression:  Worsening Worsened by:  Activity Associated symptoms: no chest pain, no cough, no fever and no vomiting   Back Pain Associated symptoms: no chest pain and no fever    This chart was scribed for non-physician practitioner, Charlann Lange PA-C working with Julianne Rice, MD, by Thea Alken, ED Scribe. This patient was seen in room WA11/WA11 and the patient's care was started at 11:53 PM.  Allison [name] is a 63 y.o. female who presents to the Emergency Department complaining of intermittent SOB for the past several days. Pt states she becomes SOB with activity. She uses 2 liter O2 at home at night as needed but has been using it more often for the past several days. Pt has an albuterol inhaler at home last used this morning without relief to symptoms. She reports associated back pain but denies urinary symptoms. She reports hx of left nephrectomy secondary to [place] and pulmonary nodules thought possible for metastasis. She is not currently undergoing chemotherapy or radiation and there are no plans to start either treatment. Pt denies cough, CP, leg swelling, emesis and fever. Pt is a former smoker who quit 4 years ago.  She reports hx afib diagnosed in July 2015, and has had recent cardioversion. She is on Eliquis chronically.   Past Medical History  Diagnosis Date  . COPD (chronic obstructive pulmonary disease)   . Tobacco abuse     hx of  . Coronary artery disease     a. 08/2009 NSTEMI s/p PCI/DES to LCX. Mod residual LAD dzs;  b.  02/2014 Nonischemic myoview.  . Hyperlipidemia   . Ischemic cardiomyopathy     a. Echo 08/14/09 with LVEF 40-45%;  b. 01/2014 Echo: EF 55-60%, basal-mid inflat and inf HK, triv MR/TR, mod dil LA/RA.  Marland Kitchen A-fib     a. Dx 01/2014 - rate controlled. b. Recurrence 03/2014 in setting of renal cell carcinoma surgery.  . 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 Acute pyelonephritis 01/28/2014  . Hypertension   . PVD (peripheral vascular disease)     a. 11/2010 L Fem to above knee Pop bypass;  b. 12/2011 Angio: patent L fem->pop graft, LCFZ 50-60%.  . Pneumonia     hx of  . Anxiety   . History of kidney stones   . Carotid disease, bilateral     a. 11/2011 U/S: RICA 24-26, LICA <83.  . Lung nodule     a. 1cm RLL nodule - followed by pulmonology.  . Shortness of breath dyspnea    Past Surgical History  Procedure Laterality Date  . Coronary stent placement  2011  . Femoral bypass Left May 2012  . Lower extremity angiogram  December 16, 2011  . Cardiac catheterization    . Tonsillectomy  63 years old  . Robot assisted laparoscopic nephrectomy Left 03/22/2014    Procedure: ROBOTIC ASSISTED LAPAROSCOPIC RADICAL NEPHRECTOMY LEFT;  Surgeon: Alexis Frock, MD;  Location: [facility] ORS;  Service: Urology;  Laterality:  Left;  . Cardioversion N/A 05/08/2014    Procedure: CARDIOVERSION;  Surgeon: Thayer Headings, MD;  Location: [facility] ENDOSCOPY;  Service: Cardiovascular;  Laterality: N/A;   Family History  Problem Relation Age of Onset  . Coronary artery disease Mother     deceased. coronary artery bypass graft  . Cancer Mother     BREAST  . Heart disease Mother     PVD  - Amputation-Leg  . Hyperlipidemia Mother   . Hypertension Mother   . Heart attack Mother   . Cirrhosis Father     deceased  . Cancer Father   . Other Brother     alive, unknown hx   History  Substance Use Topics  . Smoking status: Former Smoker -- 1.50 packs/day for 30 years    Types: Cigarettes    Quit date: 08/12/2009  .  Smokeless tobacco: Never Used     Comment: she has smoker 1-2 packs daily since the age of 86. 34 pack years  . Alcohol Use: No   OB History    No data available     Review of Systems  Constitutional: Negative for fever and chills.  Respiratory: Positive for shortness of breath. Negative for cough.   Cardiovascular: Negative for chest pain and leg swelling.  Gastrointestinal: Negative for vomiting.  Genitourinary: Negative for difficulty urinating.  Musculoskeletal: Positive for back pain.    Allergies  Ace inhibitors; Amoxicillin-pot clavulanate; and Codeine  Home Medications   Prior to Admission medications   Medication Sig Start Date End Date Taking? Authorizing Provider  albuterol (ACCUNEB) 0.63 MG/3ML nebulizer solution Take 1 ampule by nebulization every 6 (six) hours as needed for wheezing (shortness of breath).   Yes Historical Provider, MD  albuterol (PROVENTIL HFA;VENTOLIN HFA) 108 (90 BASE) MCG/ACT inhaler Inhale 2 puffs into the lungs every 6 (six) hours as needed for wheezing or shortness of breath (shortness of breath).  11/09/13  Yes Brand Males, MD  apixaban (ELIQUIS) 5 MG TABS tablet Take 5 mg by mouth 2 (two) times daily. 03/30/14  Yes Dayna N Dunn, PA-C  diphenhydramine-acetaminophen (TYLENOL PM) 25-500 MG TABS Take 2 tablets by mouth daily as needed (for sleep).    Yes Historical Provider, MD  escitalopram (LEXAPRO) 10 MG tablet Take 10 mg by mouth at bedtime.   Yes Historical Provider, MD  furosemide (LASIX) 20 MG tablet Take 20 mg by mouth every morning.   Yes Historical Provider, MD  metoprolol (LOPRESSOR) 50 MG tablet Take 1.5 tablets (75 mg total) by mouth 2 (two) times daily. 02/09/14  Yes Burnell Blanks, MD  mometasone-formoterol [facility]) 200-5 MCG/ACT AERO Inhale 2 puffs into the lungs daily. Take 2 puffs first thing in am and then another 2 puffs about 12 hours later. 02/20/14  Yes Brand Males, MD  potassium chloride (K-DUR) 10 MEQ tablet Take  10 mEq by mouth every morning. With the lasix   Yes Historical Provider, MD  simvastatin (ZOCOR) 20 MG tablet Take 20 mg by mouth every evening.   Yes Historical Provider, MD  tiotropium (SPIRIVA) 18 MCG inhalation capsule Place 18 mcg into inhaler and inhale every morning.   Yes Historical Provider, MD  acetaminophen (TYLENOL) 325 MG tablet Take 650 mg by mouth daily as needed.    Historical Provider, MD   BP 132/62 mmHg  Pulse 78  Temp(Src) 98.9 F (37.2 C) (Oral)  Resp 16  SpO2 100% Physical Exam  Constitutional: She is oriented to person, place, and time. She appears  well-developed and well-nourished. No distress.  HENT:  Head: Normocephalic and atraumatic.  Oropharynx benign  Eyes: Conjunctivae and EOM are normal.  Neck: Neck supple.  Cardiovascular: Normal rate and regular rhythm.   Pulmonary/Chest: Effort normal. She has no wheezes. She has no rhonchi. She has no rales.  Decreased air movement  Musculoskeletal: Normal range of motion. She exhibits no edema.  Neurological: She is alert and oriented to person, place, and time.  Skin: Skin is warm and dry.  Psychiatric: She has a normal mood and affect. Her behavior is normal.  Nursing note and vitals reviewed.   ED Course  Procedures (including critical care time) DIAGNOSTIC STUDIES: Oxygen Saturation is 96% on RA, normal by my interpretation.    COORDINATION OF CARE: 11:53 PM- Pt advised of plan for treatment and pt agrees.  Labs Review Labs Reviewed  BASIC METABOLIC PANEL - Abnormal; Notable for the following:    Sodium 133 (*)    Potassium 3.5 (*)    Glucose, Bld 108 (*)    GFR calc non Af Amer 58 (*)    GFR calc Af Amer 67 (*)    All other components within normal limits  CBC - Abnormal; Notable for the following:    WBC 17.0 (*)    Hemoglobin 11.4 (*)    HCT 35.2 (*)    RDW 17.7 (*)    All other components within normal limits  DIFFERENTIAL  I-STAT TROPOININ, ED   Results for orders placed or performed  during the hospital encounter of 16/10/96  Basic metabolic panel    (if pt has PMH of COPD)  Result Value Ref Range   Sodium 133 (L) 137 - 147 mEq/L   Potassium 3.5 (L) 3.7 - 5.3 mEq/L   Chloride 97 96 - 112 mEq/L   CO2 23 19 - 32 mEq/L   Glucose, Bld 108 (H) 70 - 99 mg/dL   BUN 16 6 - 23 mg/dL   Creatinine, Ser 1.02 0.50 - 1.10 mg/dL   Calcium 9.5 8.4 - 10.5 mg/dL   GFR calc non Af Amer 58 (L) >90 mL/min   GFR calc Af Amer 67 (L) >90 mL/min   Anion gap 13 5 - 15  CBC     (if pt has PMH of COPD)  Result Value Ref Range   WBC 17.0 (H) 4.0 - 10.5 K/uL   RBC 4.16 3.87 - 5.11 MIL/uL   Hemoglobin 11.4 (L) 12.0 - 15.0 g/dL   HCT 35.2 (L) 36.0 - 46.0 %   MCV 84.6 78.0 - 100.0 fL   MCH 27.4 26.0 - 34.0 pg   MCHC 32.4 30.0 - 36.0 g/dL   RDW 17.7 (H) 11.5 - 15.5 %   Platelets 237 150 - 400 K/uL  Differential  Result Value Ref Range   Neutrophils Relative % 83 (H) 43 - 77 %   Neutro Abs 14.4 (H) 1.7 - 7.7 K/uL   Lymphocytes Relative 9 (L) 12 - 46 %   Lymphs Abs 1.5 0.7 - 4.0 K/uL   Monocytes Relative 7 3 - 12 %   Monocytes Absolute 1.1 (H) 0.1 - 1.0 K/uL   Eosinophils Relative 1 0 - 5 %   Eosinophils Absolute 0.1 0.0 - 0.7 K/uL   Basophils Relative 0 0 - 1 %   Basophils Absolute 0.0 0.0 - 0.1 K/uL  I-stat troponin, ED (if patient has history of COPD)  Result Value Ref Range   Troponin i, poc 0.00 0.00 - 0.08  ng/mL   Comment 3            Imaging Review Dg Chest 2 View (if Patient Has Fever And/or Copd)  05/10/2014   CLINICAL DATA:  Shortness of breath worsening today. History of COPD and metastatic renal cell carcinoma. History of hypertension and atrial fibrillation.  EXAM: CHEST  2 VIEW  COMPARISON:  03/24/2014  FINDINGS: The lungs are hyperinflated. Perihilar bronchitic changes are noted. There is enlargement of the heart. Basilar Kerley B-lines are present consistent with interstitial pulmonary edema. Streaky opacities in the lingula and medial lung bases appear stable over  recent exams.  IMPRESSION: 1. Bronchitic changes and emphysema. 2. Cardiomegaly and mild interstitial edema. 3. Chronic bibasilar scarring.   Electronically Signed   By: Shon Hale M.D.   On: 05/10/2014 22:52     EKG Interpretation None      MDM   Final diagnoses:  None  1. COPD 2. Bronchitis  Patient with a history of COPD, PAF with recent cardioversion, prn home O2 with subjective increased oxygen need but no hypoxia, here with increased DOE over 2-3 days, similar to episodes bronchitis previously. No fever, chest pain, vomiting. Here, no PNA on CXR, positive changes of bronchitis, mild leukocytosis. Appears non-toxic, comfortable in the room. Nebulizer prior to arrival. Considerations: cardiac given recent cardioversion vs. Pulmonary with h/o COPD.  EKG NRS, normal rate A-fib on monitor. Suspect this is normal per Dr. Elmarie Shiley note at time of CV. No tachycardia.   She is given IV solumedrol here. Ambulation with mild dyspnea but no hypoxia, maintaining saturation of 95%. Discussed hospitalization. Patient states she would prefer discharge home. Daughter (presumed) at bedside reports pulse oximeter at home, multiple family members are paramedics and feel safe with discharge based on patient's current condition. Nebulizer offered at time of decision making and patient declined, "will do one when I get home". Will provide steroid dosing for the next 6 days (taper) and Z-pack. Return precautions understood. She is to call Dr. Chase Caller tomorrow for office follow up schedule.   I personally performed the services described in this documentation, which was scribed in my presence. The recorded information has been reviewed and is accurate.      Dewaine Oats, PA-C 05/11/14 0155  Dewaine Oats, PA-C 05/11/14 7366  Julianne Rice, MD 05/12/14 [phone]

## 2014-05-10 NOTE — ED Notes (Signed)
Pt from home via [facility] c/o shortness of breath (worse with exertion)nd back pain x few days. Denies fever or pain currently. She reports a dry cough and has been taking mucinex. Hx of kidney CA  And left nephrectomy in 03/2014 and reports things have been going well with removal.  She uses 2 LC of O2 at night then as needed during day. HX of COPD.20 G IV left hand present from EMS.

## 2014-05-11 ENCOUNTER — Telehealth: Payer: Self-pay | Admitting: Internal Medicine

## 2014-05-11 ENCOUNTER — Other Ambulatory Visit: Payer: Self-pay | Admitting: Internal Medicine

## 2014-05-11 LAB — DIFFERENTIAL
Basophils Absolute: 0 10*3/uL (ref 0.0–0.1)
Basophils Relative: 0 % (ref 0–1)
Eosinophils Absolute: 0.1 10*3/uL (ref 0.0–0.7)
Eosinophils Relative: 1 % (ref 0–5)
LYMPHS ABS: 1.5 10*3/uL (ref 0.7–4.0)
LYMPHS PCT: 9 % — AB (ref 12–46)
MONOS PCT: 7 % (ref 3–12)
Monocytes Absolute: 1.1 10*3/uL — ABNORMAL HIGH (ref 0.1–1.0)
NEUTROS ABS: 14.4 10*3/uL — AB (ref 1.7–7.7)
NEUTROS PCT: 83 % — AB (ref 43–77)

## 2014-05-11 MED ORDER — PREDNISONE 10 MG PO TABS: ORAL_TABLET | ORAL | Status: DC

## 2014-05-11 MED ORDER — AZITHROMYCIN 250 MG PO TABS: 250.0000 mg | ORAL_TABLET | Freq: Every day | ORAL | Status: DC

## 2014-05-11 MED ORDER — METHYLPREDNISOLONE SODIUM SUCC 125 MG IJ SOLR
125.0000 mg | Freq: Once | INTRAMUSCULAR | Status: AC
  Administered 2014-05-11: 125 mg via INTRAVENOUS
  Filled 2014-05-11: qty 2

## 2014-05-11 MED ORDER — AZITHROMYCIN 250 MG PO TABS
500.0000 mg | ORAL_TABLET | Freq: Once | ORAL | Status: AC
  Administered 2014-05-11: 500 mg via ORAL
  Filled 2014-05-11: qty 2

## 2014-05-11 MED ORDER — ALBUTEROL SULFATE HFA 108 (90 BASE) MCG/ACT IN AERS: 2.0000 | INHALATION_SPRAY | Freq: Four times a day (QID) | RESPIRATORY_TRACT | Status: DC | PRN

## 2014-05-11 NOTE — Telephone Encounter (Signed)
Yeah see Allison [name] in < 7 day sfollowing a ER visit as part of protocol

## 2014-05-11 NOTE — ED Notes (Signed)
Pt ambulatory upon DC.

## 2014-05-11 NOTE — ED Notes (Signed)
PA at bedside.

## 2014-05-11 NOTE — Discharge Instructions (Signed)

## 2014-05-11 NOTE — ED Notes (Signed)
Pulse Ox 95% on Room Air while ambulating.

## 2014-05-11 NOTE — Telephone Encounter (Signed)
Spoke with the pt  She is calling to let MR know she had to go to ED last night 05/10/14 with COPD exacerbation  She reports that she was having increased SOB and coughing  She was started on pred taper and zithromax  She feels much better today  She is just calling to see if MR would like to her f/u sooner than her next f/u which will be due end of Jan 2016 Please advise thanks!

## 2014-05-12 ENCOUNTER — Encounter: Payer: Self-pay | Admitting: Physician Assistant

## 2014-05-12 NOTE — Telephone Encounter (Signed)
Called and spoke with pt and she is aware of MR recs.  She is aware of appt scheduled with TP on 11/11

## 2014-05-17 ENCOUNTER — Encounter: Payer: Self-pay | Admitting: Adult Health

## 2014-05-17 ENCOUNTER — Ambulatory Visit (INDEPENDENT_AMBULATORY_CARE_PROVIDER_SITE_OTHER): Payer: Medicaid Other | Admitting: Adult Health

## 2014-05-17 ENCOUNTER — Ambulatory Visit: Payer: Medicaid Other | Admitting: Cardiovascular Disease

## 2014-05-17 VITALS — BP 126/62 | HR 55 | Temp 97.1°F | Ht 67.0 in | Wt 169.4 lb

## 2014-05-17 DIAGNOSIS — R911 Solitary pulmonary nodule: Secondary | ICD-10-CM

## 2014-05-17 DIAGNOSIS — J449 Chronic obstructive pulmonary disease, unspecified: Secondary | ICD-10-CM

## 2014-05-17 DIAGNOSIS — G4734 Idiopathic sleep related nonobstructive alveolar hypoventilation: Secondary | ICD-10-CM

## 2014-05-17 MED ORDER — LEVALBUTEROL HCL 0.63 MG/3ML IN NEBU: 0.6300 mg | INHALATION_SOLUTION | Freq: Three times a day (TID) | RESPIRATORY_TRACT | Status: DC | PRN

## 2014-05-17 NOTE — Addendum Note (Signed)
Addended by: Parke Poisson E on: 05/17/2014 01:08 PM   Modules accepted: Orders, Medications

## 2014-05-17 NOTE — Progress Notes (Signed)
Subjective:    Patient ID: Allison [name], female    DOB: 02-07-1951, 63 y.o.   MRN: [id number]  HPI #COPD  - gold stage 3 - MS phenotpe - medicaid refused rehab  #Smoking   reports that she quit smoking about 3 years ago. Her smoking use included Cigarettes. She has a 45 pack-year smoking history. She has never used smokeless tobacco.  #imgaging data - May 20-12: clear cxr. No CT data - Dec 2014: she is open for low dose CT but will save $  #Alpha 1   - MS  #Thrushs  - July 2014 on symbicort - dec 2014 on bre0   #Lung nodule 1cm RLL - 01/28/14 in setting of kidney cancer left side   #RENAL CELL CARCINOMA, 6.5 CM, EXTENDING INTO PROXIMAL RENAL VEIN. - LEFT SIDE, s/p nephrectomy 03/22/14 - MARGINS NOT INVOLVED. - ADRENAL GLAND FREE OF TUMO        OV 04/04/2014  Chief Complaint  Patient presents with  . Follow-up    Pt had left nephrectomy 2 weeks ago. Pt states she has intermitent DOE. Pt denies cough and CP/tightness. Pt states she is still wearing nocturnal O2.    OV   COPD: saw her last in august She is s.p nephretomy left side for renal cell CA 03/22/14. Post op course went well from pulmonary stand point. Some abd wound non healing being followed by Dr Tresa Moore. COPD istself is  Stable. COntinues with ? Nocturnal o2, daily scheduled spiriva and dulera. UPtodate with vaccines. Main issue is post op fatigue but she is improving from it  SMoking:  reports that she quit smoking about 4 years ago. Her smoking use included Cigarettes. She has a 45 pack-year smoking history. She has never used smokeless tobacco.   L#Lung nodule Right lower lobe 1cm on CT chest 01/28/14- presumed due to kidney cancer   - this was noted during renal cell CA workup. SHe needs fu CT now but wants to defer for a few weeks to help her get over fatigue   #NEw - exam shows scratch marks on forearms; due to Cat. She says she cleans with antiseptic. No evidence of infetion  05/17/2014 ER follow  up  Patient returns for a follow-up from a recent emergency room visit. She was seen on November 4 for COPD flare with bronchitis. Chest x-ray showed no pneumonia with positive changes of bronchitis. She was treated with IV Solu-Medrol, and discharged on a Z-Pak and a prednisone taper  Feels breathing is back to baseline except for some occasional hoarseness.  finished zpak and prednisone yesterday.  She denies any chest pain, orthopnea, PND or leg swelling Remains on Dulera and Spiriva   Underwent left nephrectomy(renal cell carcinoma)  03/2014 has done well post op.   Has suspicious bilateral lung nodules that are being followed by serial CT . Next CT is due in 07/2014 .   Review of Systems  Constitutional: Negative for fever and unexpected weight change.  HENT: Negative for congestion, dental problem, ear pain, nosebleeds, postnasal drip, rhinorrhea, sinus pressure, sneezing, sore throat and trouble swallowing.   Eyes: Negative for redness and itching.  Respiratory: . Negative for cough, chest tightness and wheezing.   Cardiovascular: Negative for palpitations and leg swelling.  Gastrointestinal: Negative for nausea and vomiting.  Genitourinary: Negative for dysuria.  Musculoskeletal: Negative for joint swelling.  Skin: Negative for rash.  Neurological: Negative for headaches.  Hematological: Does not bruise/bleed easily.  Psychiatric/Behavioral: Negative for  dysphoric mood. The patient is not nervous/anxious.      Objective:   Physical Exam GEN: A/Ox3; pleasant , NAD,   HEENT:  [place]/AT,  EACs-clear, TMs-wnl, NOSE-clear, THROAT-clear, no lesions, no postnasal drip or exudate noted.   NECK:  Supple w/ fair ROM; no JVD; normal carotid impulses w/o bruits; no thyromegaly or nodules palpated; no lymphadenopathy.  RESP  Decreased BS w/ no wheezing no accessory muscle use, no dullness to percussion  CARD:  RRR, no m/r/g  , no peripheral edema, pulses intact, no cyanosis or  clubbing.  GI:   Soft & nt; nml bowel sounds; no organomegaly or masses detected.  Musco: Warm bil, no deformities or joint swelling noted.   Neuro: alert, no focal deficits noted.    Skin: Warm, no lesions or rashes

## 2014-05-17 NOTE — Assessment & Plan Note (Signed)
Recent flare, now resolved with antibiotics and steroids. Patient's continue on her current regimen with Dulera and Spiriva

## 2014-05-17 NOTE — Patient Instructions (Signed)
Continue on  Dulera  And Spiriva  Follow up Dr. Chase Caller as planned and.prn  Follow up in January for CT as planned  Please contact office for sooner follow up if symptoms do not improve or worsen or seek emergency care

## 2014-05-17 NOTE — Assessment & Plan Note (Signed)
Bilateral lung nodules In the setting of recently diagnosed renal cell carcinoma status post nephrectomy Patient has a follow-up CT scheduled for January

## 2014-05-17 NOTE — Assessment & Plan Note (Signed)
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

## 2014-06-15 ENCOUNTER — Encounter (HOSPITAL_COMMUNITY): Payer: Self-pay | Admitting: Surgery

## 2014-06-20 ENCOUNTER — Other Ambulatory Visit: Payer: Self-pay | Admitting: Cardiovascular Disease

## 2014-06-23 ENCOUNTER — Telehealth: Payer: Self-pay | Admitting: Internal Medicine

## 2014-06-23 DIAGNOSIS — Z87891 Personal history of nicotine dependence: Secondary | ICD-10-CM

## 2014-06-23 DIAGNOSIS — J449 Chronic obstructive pulmonary disease, unspecified: Secondary | ICD-10-CM

## 2014-06-23 DIAGNOSIS — R911 Solitary pulmonary nodule: Secondary | ICD-10-CM

## 2014-06-23 DIAGNOSIS — C649 Malignant neoplasm of unspecified kidney, except renal pelvis: Secondary | ICD-10-CM

## 2014-06-23 NOTE — Telephone Encounter (Signed)
Order redone for CT chest in jan 2016 to resubmit to medicaid. Not sure why they denied it. RLL lung nodule 1.5cm in Oct 2015. Work with [facility] please

## 2014-06-27 NOTE — Telephone Encounter (Signed)
Pt has been rescheduled and precert will be done closer to appt date Allison [name]

## 2014-06-27 NOTE — Telephone Encounter (Signed)
Libby, please advise.

## 2014-07-11 ENCOUNTER — Other Ambulatory Visit: Payer: Medicaid Other

## 2014-07-18 ENCOUNTER — Ambulatory Visit (INDEPENDENT_AMBULATORY_CARE_PROVIDER_SITE_OTHER): Payer: Medicaid Other | Admitting: Cardiovascular Disease

## 2014-07-18 ENCOUNTER — Ambulatory Visit (HOSPITAL_COMMUNITY): Payer: Medicaid Other | Attending: Cardiology | Admitting: Cardiology

## 2014-07-18 ENCOUNTER — Encounter: Payer: Self-pay | Admitting: Cardiovascular Disease

## 2014-07-18 ENCOUNTER — Ambulatory Visit (HOSPITAL_BASED_OUTPATIENT_CLINIC_OR_DEPARTMENT_OTHER): Payer: Medicaid Other | Admitting: Cardiology

## 2014-07-18 VITALS — BP 140/70 | HR 54 | Ht 67.0 in | Wt 176.2 lb

## 2014-07-18 DIAGNOSIS — I739 Peripheral vascular disease, unspecified: Secondary | ICD-10-CM

## 2014-07-18 DIAGNOSIS — Z87891 Personal history of nicotine dependence: Secondary | ICD-10-CM | POA: Diagnosis not present

## 2014-07-18 DIAGNOSIS — I255 Ischemic cardiomyopathy: Secondary | ICD-10-CM

## 2014-07-18 DIAGNOSIS — E785 Hyperlipidemia, unspecified: Secondary | ICD-10-CM | POA: Insufficient documentation

## 2014-07-18 DIAGNOSIS — I2510 Atherosclerotic heart disease of native coronary artery without angina pectoris: Secondary | ICD-10-CM

## 2014-07-18 DIAGNOSIS — I10 Essential (primary) hypertension: Secondary | ICD-10-CM | POA: Diagnosis not present

## 2014-07-18 DIAGNOSIS — Z09 Encounter for follow-up examination after completed treatment for conditions other than malignant neoplasm: Secondary | ICD-10-CM | POA: Diagnosis not present

## 2014-07-18 DIAGNOSIS — I779 Disorder of arteries and arterioles, unspecified: Secondary | ICD-10-CM

## 2014-07-18 DIAGNOSIS — Z95820 Peripheral vascular angioplasty status with implants and grafts: Secondary | ICD-10-CM | POA: Insufficient documentation

## 2014-07-18 DIAGNOSIS — J449 Chronic obstructive pulmonary disease, unspecified: Secondary | ICD-10-CM | POA: Insufficient documentation

## 2014-07-18 DIAGNOSIS — I6523 Occlusion and stenosis of bilateral carotid arteries: Secondary | ICD-10-CM

## 2014-07-18 NOTE — Patient Instructions (Signed)
Your physician wants you to follow-up in:  6 months.  You will receive a reminder letter in the mail two months in advance. If you don't receive a letter, please call our office to schedule the follow-up appointment.  Your physician has requested that you have a carotid duplex. This test is an ultrasound of the carotid arteries in your neck. It looks at blood flow through these arteries that supply the brain with blood. Allow one hour for this exam. There are no restrictions or special instructions.   Your physician has requested that you have an ankle brachial index (ABI). During this test an ultrasound and blood pressure cuff are used to evaluate the arteries that supply the arms and legs with blood. Allow thirty minutes for this exam. There are no restrictions or special instructions.

## 2014-07-18 NOTE — Progress Notes (Signed)
ABI performed  

## 2014-07-18 NOTE — Progress Notes (Signed)
Carotid duplex performed 

## 2014-07-18 NOTE — Progress Notes (Signed)
History of Present Illness: 64 yo WF with history of HTN, hyperlipidemia, CAD with prior NSTEMI, respiratory failure February 2011, PAD here today for cardiac follow up. Her PV issues are followed by Dr. Trula Slade with [facility]. She underwent cardiac cath in February 2011 in the setting of a NSTEMI showing severe stenosis of the Circumflex, now s/p DES to the Circumflex. Her EF was found to be 30%, presumed to be non-ischemic in etiology. Echo on 08/14/09 with EF of 40-45%. She had left femoral to above knee popliteal bypass on Nov 15, 2010. She did well and has been followed by Dr. Trula Slade for graft surveillance. Angiogram 12/16/11 with patent left femoral to popliteal artery bypass graft with 50-60% left common femoral artery stenosis. Conservative management pursued secondary to the small size of the vessel. Echo March 2013 with normal LV size and function and no significant valvular issues. Carotid artery dopplers May 2013 in [facility] with 40-59% RICA stenosis and less than 70% LICA stenosis. Stress myoview August 2015 without ischemia. Echo 01/28/14 with normal LV function, trivial MR, MAD, biatrial enlargment. Admitted to [facility] July 2015 with abdominal pain, flank pain and hematuria. Found to have left renal cell mass c/w renal cell carcinoma. She had been on Plavix chronically and that was initially held. She was also found to have scattered pulmonary nodules. Hematuria resolved. She was admitted 9/16-9/18 and underwent robotic assisted lap radical left nephrectomy. Post-operatively, she became tachycardic in the setting of baseline afib with rates into the 160's. She was initially given IV diltiazem but dropped her pressure. Both rate and BP were able to be stabilized. At her follow-up on 03/30/14 she was started on Eliquis. She was cardioverted on 05/08/14 to sinus.   She is here today for cardiac follow up. Breathing is at baseline. She reports no chest pain. Her legs feel great. She is not smoking. No LE edema.    Primary Care Physician: [facility] (Dr. Sunday Spillers)  Last Lipid Profile:Lipid Panel     Component Value Date/Time   CHOL 171 11/30/2012 1122   TRIG 66.0 11/30/2012 1122   HDL 64.70 11/30/2012 1122   CHOLHDL 3 11/30/2012 1122   VLDL 13.2 11/30/2012 1122   [place] 93 11/30/2012 1122    Past Medical History  Diagnosis Date  . COPD (chronic obstructive pulmonary disease)   . Tobacco abuse     hx of  . Coronary artery disease     a. 08/2009 NSTEMI s/p PCI/DES to LCX. Mod residual LAD dzs;  b. 02/2014 Nonischemic myoview.  . Hyperlipidemia   . Ischemic cardiomyopathy     a. Echo 08/14/09 with LVEF 40-45%;  b. 01/2014 Echo: EF 55-60%, basal-mid inflat and inf HK, triv MR/TR, mod dil LA/RA.  Marland Kitchen A-fib     a. Dx 01/2014 - rate controlled. b. Recurrence 03/2014 in setting of renal cell carcinoma surgery.  . 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 Acute pyelonephritis 01/28/2014  . Hypertension   . PVD (peripheral vascular disease)     a. 11/2010 L Fem to above knee Pop bypass;  b. 12/2011 Angio: patent L fem->pop graft, LCFZ 50-60%.  . Pneumonia     hx of  . Anxiety   . History of kidney stones   . Carotid disease, bilateral     a. 11/2011 U/S: RICA 01-74, LICA <94.  . Lung nodule     a. 1cm RLL nodule - followed by pulmonology.  . Shortness of breath dyspnea  Past Surgical History  Procedure Laterality Date  . Coronary stent placement  2011  . Femoral bypass Left May 2012  . Lower extremity angiogram  December 16, 2011  . Cardiac catheterization    . Tonsillectomy  64 years old  . Robot assisted laparoscopic nephrectomy Left 03/22/2014    Procedure: ROBOTIC ASSISTED LAPAROSCOPIC RADICAL NEPHRECTOMY LEFT;  Surgeon: Alexis Frock, MD;  Location: [facility];  Service: Urology;  Laterality: Left;  . Cardioversion N/A 05/08/2014    Procedure: CARDIOVERSION;  Surgeon: Thayer Headings, MD;  Location: [place];  Service: Cardiovascular;  Laterality: N/A;  . Lower  extremity angiogram N/A 12/16/2011    Procedure: LOWER EXTREMITY ANGIOGRAM;  Surgeon: Serafina Mitchell, MD;  Location: [facility] CATH LAB;  Service: Cardiovascular;  Laterality: N/A;    Current Outpatient Prescriptions  Medication Sig Dispense Refill  . acetaminophen (TYLENOL) 325 MG tablet Take 650 mg by mouth daily as needed.    Marland Kitchen albuterol (PROVENTIL HFA;VENTOLIN HFA) 108 (90 BASE) MCG/ACT inhaler Inhale 2 puffs into the lungs every 6 (six) hours as needed for wheezing or shortness of breath (shortness of breath). 1 Inhaler 3  . apixaban (ELIQUIS) 5 MG TABS tablet Take 5 mg by mouth 2 (two) times daily.    . diphenhydramine-acetaminophen (TYLENOL PM) 25-500 MG TABS Take 2 tablets by mouth daily as needed (for sleep).     Marland Kitchen escitalopram (LEXAPRO) 10 MG tablet Take 10 mg by mouth at bedtime.    . furosemide (LASIX) 20 MG tablet Take 20 mg by mouth every morning.    . levalbuterol (XOPENEX) 0.63 MG/3ML nebulizer solution Take 3 mLs (0.63 mg total) by nebulization every 8 (eight) hours as needed for wheezing or shortness of breath. 300 mL 5  . metoprolol (LOPRESSOR) 50 MG tablet Take 1.5 tablets (75 mg total) by mouth 2 (two) times daily. 90 tablet 6  . mometasone-formoterol (DULERA) 200-5 MCG/ACT AERO Inhale 2 puffs into the lungs daily. Take 2 puffs first thing in am and then another 2 puffs about 12 hours later.    . potassium chloride (K-DUR) 10 MEQ tablet Take 10 mEq by mouth every morning. With the lasix    . simvastatin (ZOCOR) 20 MG tablet Take 20 mg by mouth every evening.    . simvastatin (ZOCOR) 20 MG tablet TAKE 1 TABLET IN THE EVENING 30 tablet 1  . tiotropium (SPIRIVA) 18 MCG inhalation capsule Place 18 mcg into inhaler and inhale every morning.     No current facility-administered medications for this visit.    Allergies  Allergen Reactions  . Ace Inhibitors Cough  . Amoxicillin-Pot Clavulanate Hives  . Codeine Nausea And Vomiting    History   Social History  . Marital Status:  Married    Spouse Name: N/A    Number of Children: 3  . Years of Education: N/A   Occupational History  . sculptor    Social History Main Topics  . Smoking status: Former Smoker -- 1.50 packs/day for 30 years    Types: Cigarettes    Quit date: 08/12/2009  . Smokeless tobacco: Never Used     Comment: she has smoker 1-2 packs daily since the age of 39. 19 pack years  . Alcohol Use: No  . Drug Use: No  . Sexual Activity: Not on file   Other Topics Concern  . Not on file   Social History Narrative    Family History  Problem Relation Age of Onset  . Coronary artery disease  Mother     deceased. coronary artery bypass graft  . Cancer Mother     BREAST  . Heart disease Mother     PVD  - Amputation-Leg  . Hyperlipidemia Mother   . Hypertension Mother   . Heart attack Mother   . Cirrhosis Father     deceased  . Cancer Father   . Other Brother     alive, unknown hx    Review of Systems:  As stated in the HPI and otherwise negative.   BP 140/70 mmHg  Pulse 54  Ht 5\' 7"  (1.702 m)  Wt 176 lb 3.2 oz (79.924 kg)  BMI 27.59 kg/m2  SpO2 96%  Physical Examination: General: Well developed, well nourished, NAD HEENT: OP clear, mucus membranes moist SKIN: warm, dry. No rashes. Neuro: No focal deficits Musculoskeletal: Muscle strength 5/5 all ext Psychiatric: Mood and affect normal Neck: No JVD, no carotid bruits, no thyromegaly, no lymphadenopathy. Lungs:Clear bilaterally, no wheezes, rhonci, crackles Cardiovascular: Regular rate and rhythm. No murmurs, gallops or rubs. Abdomen:Soft. Bowel sounds present. Non-tender.  Extremities: No lower extremity edema. Pulses are not palpable in the bilateral DP/PT.  Stress myoview 02/11/14: Rest Procedure: Myocardial perfusion imaging was performed at rest 45 minutes following the intravenous administration of Technetium 36m Sestamibi. Rest ECG: NSR - Normal EKG  Stress Procedure: The patient received IV Lexiscan 0.4 mg over  15-seconds. Technetium 26m Sestamibi injected at 30-seconds. The patient had sob, dizziness, and was hot with the Lexiscan injection. Quantitative spect images were obtained after a 45 minute delay. Stress ECG: No significant change from baseline ECG  QPS Raw Data Images: There is interference from nuclear activity from structures below the diaphragm. This does not affect the ability to read the study. Stress Images: There is a small severe area of attenuation of the inferior mid-base. The uptake in the other regions is nromal.  Rest Images: There is a small severe area of attenuation of the inferior mid-base. The uptake in the other regions is nromal. Subtraction (SDS): No evidence of ischemia. There is a fixed inferior basal-mid scar. Transient Ischemic Dilatation (Normal <1.22): 1.17 Lung/Heart Ratio (Normal <0.45): 0.33  Quantitative Gated Spect Images QGS EDV: 80 ml QGS ESV: 35 ml  Impression Exercise Capacity: Lexiscan with no exercise. BP Response: Normal blood pressure response. Clinical Symptoms: No significant symptoms noted. ECG Impression: No significant ST segment change suggestive of ischemia. Comparison with Prior Nuclear Study: No significant change from previous study from 11/11/12.   Overall Impression: Low risk stress nuclear study . There is evidence of a previouis mid-basal inferior MI. There is no evidence of ischemia. .  LV Ejection Fraction: 56%. LV Wall Motion: There is mid-basal inferior severe hypokinesis. The overall EF remains normal.   Echo 01/28/14: Left ventricle: The cavity size was normal. Wall thickness was normal. Systolic function was normal. The estimated ejection fraction was in the range of 55% to 60%. Probable hypokinesis of the basal-midinferolateral and inferior myocardium. The study is not technically sufficient to allow evaluation of LV diastolic function. - Aortic valve: Mildly calcified annulus. Trileaflet;  mildly calcified leaflets. There was no significant regurgitation. - Mitral valve: Calcified annulus. There was trivial regurgitation. - Left atrium: The atrium was moderately dilated. - Right atrium: The atrium was moderately dilated. Central venous pressure (est): 8 mm Hg. - Tricuspid valve: There was trivial regurgitation. - Pulmonary arteries: Systolic pressure could not be accurately estimated. - Pericardium, extracardiac: There was no pericardial effusion. Impressions: - Normal LV  chamber size and wall thickness with LVEF 55-60%, probable hypokinesis of the mid to basal inferolateral wall. Indeterminate diastolic function in the setting of atrial fibrillation. Moderate biatrial enlargement. MAC with trivial mitral regurgitation. Unable to assess PASP.  EKG: sinus brady, rate 54 bpm.   Assessment and Plan:   1. CAD: Stable. Will continue current medical therapy.   2. PAD/carotid artery disease: Will repeat carotid dopplers now. No leg pain with ambulation. Will repeat ABI. Will refer back to [facility] if any changes. She is asymptomatic.   3. Hyperlipidemia: Continue statin.   4. COPD: Followed in [facility]   5. Atrial fibrillation: Cardioversion November 2015, now sinus. Continue beta blocker and anti-coagulation.    6. Renal cell carcinoma: s/p resection, stable.

## 2014-07-19 ENCOUNTER — Telehealth: Payer: Self-pay | Admitting: Cardiovascular Disease

## 2014-07-19 NOTE — Telephone Encounter (Signed)
New Msg ° ° ° ° °Pt returning call, please call back. °

## 2014-07-19 NOTE — Telephone Encounter (Signed)
Spoke with pt and reviewed ABI's and carotid doppler results with her.

## 2014-07-20 ENCOUNTER — Ambulatory Visit: Payer: Medicaid Other | Admitting: Oncology

## 2014-07-28 ENCOUNTER — Inpatient Hospital Stay: Admission: RE | Admit: 2014-07-28 | Payer: Medicaid Other | Source: Ambulatory Visit

## 2014-08-01 ENCOUNTER — Ambulatory Visit: Payer: Medicaid Other | Admitting: Oncology

## 2014-08-03 ENCOUNTER — Inpatient Hospital Stay: Admission: RE | Admit: 2014-08-03 | Payer: Medicaid Other | Source: Ambulatory Visit

## 2014-08-08 ENCOUNTER — Ambulatory Visit: Payer: Medicaid Other | Admitting: Oncology

## 2014-08-28 ENCOUNTER — Other Ambulatory Visit: Payer: Self-pay

## 2014-08-28 MED ORDER — ESCITALOPRAM OXALATE 10 MG PO TABS: 10.0000 mg | ORAL_TABLET | Freq: Every day | ORAL | Status: DC

## 2014-08-28 MED ORDER — SIMVASTATIN 20 MG PO TABS
20.0000 mg | ORAL_TABLET | Freq: Every evening | ORAL | Status: DC
Start: 2014-08-28 — End: 2015-03-22

## 2014-08-28 MED ORDER — FUROSEMIDE 20 MG PO TABS: 20.0000 mg | ORAL_TABLET | Freq: Every morning | ORAL | Status: DC

## 2014-08-31 ENCOUNTER — Telehealth: Payer: Self-pay | Admitting: Internal Medicine

## 2014-08-31 MED ORDER — AZITHROMYCIN 250 MG PO TABS: 250.0000 mg | ORAL_TABLET | ORAL | Status: DC

## 2014-08-31 MED ORDER — PREDNISONE 10 MG PO TABS: ORAL_TABLET | ORAL | Status: DC

## 2014-08-31 NOTE — Telephone Encounter (Signed)
Spoke with pt, c/o prod cough with yellow mucus X3 days, little increased sob.  Denies sinus congestion, fever, chest pain/tightness.  [facility] [place].  Is requesting an abx and pred taper.  MR please advise.  Thanks!

## 2014-08-31 NOTE — Telephone Encounter (Signed)
She is in aecopd  zpak and Please take prednisone 40 mg x1 day, then 30 mg x1 day, then 20 mg x1 day, then 10 mg x1 day, and then 5 mg x1 day and stop  Dr. Brand Males, M.D., [facility].C.P Pulmonary and Critical Care Medicine Staff Physician [place] [facility] Pager: [phone], If no answer or between  15:00h - 7:00h: call [phone]  08/31/2014 1:16 PM

## 2014-08-31 NOTE — Telephone Encounter (Signed)
Spoke with the pt and notified of results/recs per MR Pt verbalized understanding  Nothing further needed Rxs were sent to pharm  She will call for appt if not improving

## 2014-09-22 ENCOUNTER — Other Ambulatory Visit: Payer: Self-pay | Admitting: Cardiovascular Disease

## 2014-09-22 ENCOUNTER — Telehealth: Payer: Self-pay | Admitting: Internal Medicine

## 2014-09-22 MED ORDER — TIOTROPIUM BROMIDE MONOHYDRATE 18 MCG IN CAPS: 18.0000 ug | ORAL_CAPSULE | Freq: Every morning | RESPIRATORY_TRACT | Status: DC

## 2014-09-22 NOTE — Telephone Encounter (Signed)
Pt req. spiriva refill to [facility].  This has been sent.  Nothing further needed.

## 2014-10-03 DIAGNOSIS — Z0279 Encounter for issue of other medical certificate: Secondary | ICD-10-CM | POA: Diagnosis not present

## 2014-10-12 ENCOUNTER — Telehealth: Payer: Self-pay | Admitting: *Deleted

## 2014-10-12 NOTE — Telephone Encounter (Signed)
Received from from [place] . Form is from [place].  I placed call to her for clarification. Left number to call back.  Phone number is [phone]

## 2014-10-16 NOTE — Telephone Encounter (Signed)
Spoke with Hilda Blades at [place] and form sent to our office is to be completed with Dr. Camillia Herter license number and DEA number and faxed back.  They will then send prescription for Eliquis to our office for Dr. Angelena Form to sign and pt may be eligible for free Eliquis through assistance program.  I confirmed fax number and will fax completed form.  I also let Hilda Blades know we received fax addressed to Dr. Chase Caller and made her aware he is not in this office. I gave her phone number for Dr. Golden Pop office.

## 2014-10-16 NOTE — Telephone Encounter (Signed)
Completed form faxed to [place].

## 2014-10-24 ENCOUNTER — Ambulatory Visit (INDEPENDENT_AMBULATORY_CARE_PROVIDER_SITE_OTHER): Payer: Medicaid Other | Admitting: Pharmacist Clinician (PhC)/ Clinical Pharmacy Specialist

## 2014-10-24 ENCOUNTER — Telehealth: Payer: Self-pay | Admitting: Cardiovascular Disease

## 2014-10-24 ENCOUNTER — Telehealth: Payer: Self-pay | Admitting: Internal Medicine

## 2014-10-24 ENCOUNTER — Other Ambulatory Visit (INDEPENDENT_AMBULATORY_CARE_PROVIDER_SITE_OTHER): Payer: Medicaid Other | Admitting: *Deleted

## 2014-10-24 DIAGNOSIS — I4819 Other persistent atrial fibrillation: Secondary | ICD-10-CM

## 2014-10-24 DIAGNOSIS — I481 Persistent atrial fibrillation: Secondary | ICD-10-CM

## 2014-10-24 LAB — CBC
HCT: 38.9 % (ref 36.0–46.0)
Hemoglobin: 13 g/dL (ref 12.0–15.0)
MCHC: 33.3 g/dL (ref 30.0–36.0)
MCV: 88.9 fl (ref 78.0–100.0)
Platelets: 300 10*3/uL (ref 150.0–400.0)
RBC: 4.38 Mil/uL (ref 3.87–5.11)
RDW: 16 % — ABNORMAL HIGH (ref 11.5–15.5)
WBC: 10.4 10*3/uL (ref 4.0–10.5)

## 2014-10-24 LAB — BASIC METABOLIC PANEL
BUN: 14 mg/dL (ref 6–23)
CO2: 28 mEq/L (ref 19–32)
CREATININE: 1.09 mg/dL (ref 0.40–1.20)
Calcium: 9.6 mg/dL (ref 8.4–10.5)
Chloride: 104 mEq/L (ref 96–112)
GFR: 54.03 mL/min — ABNORMAL LOW (ref >60.00)
GLUCOSE: 97 mg/dL (ref 70–99)
POTASSIUM: 3.9 meq/L (ref 3.5–5.1)
Sodium: 137 mEq/L (ref 135–145)

## 2014-10-24 NOTE — Telephone Encounter (Signed)
I refaxed form today (see phone note dated 10/24/14). I spoke with Becky Sax to let her know form was faxed to her again today and asked her to call our office if any questions.

## 2014-10-24 NOTE — Telephone Encounter (Signed)
LMTCB with the pt's spouse  

## 2014-10-24 NOTE — Telephone Encounter (Signed)
Received call from [facility] and she has form. She sent prescriptions to our office on October 18, 2014 to be signed. I told her I have not received these. She will refax.

## 2014-10-24 NOTE — Telephone Encounter (Signed)
Completed form was faxed on October 16, 2014. I just refaxed and received confirmation that fax went through.  I spoke with pt and gave her this information.

## 2014-10-24 NOTE — Progress Notes (Signed)
Pt was started on Eliquis 5mg  BID by Dr Angelena Form for afib on 03/30/14.    Reviewed patients medication list.  Pt is not currently on any combined P-gp and strong CYP3A4 inhibitors/inducers (ketoconazole, traconazole, ritonavir, carbamazepine, phenytoin, rifampin, St. John's wort).  Reviewed labs.  SCr 1.02, Weight 79.5 kg, Age-64 y/o.  Dose appropriate based on CrCl.   Hgb and HCT Within Normal Limits   Pt reports no unusual bleeding/bruising.  Is in the process of getting medication thru [facility] Department of Health/patient assistance.  Will draw labs today after visit and verify SCr.    Continue on Eliquis 5mg  BID, dosage is appropriate.  Recheck blood work in 6 mos.  Follow-up appt made in clinic for 04/30/15 at [facility]. Will continue to follow in [facility] given removal of 1 kidney in 2015.

## 2014-10-24 NOTE — Telephone Encounter (Signed)
New Message       Pt calling stating that she is almost out of medications and needs for Dr. Angelena Form to respond to a fax that was sent from [facility]. Please call back and advise.

## 2014-10-24 NOTE — Addendum Note (Signed)
Addended by: Eulis Foster on: 10/24/2014 09:26 AM   Modules accepted: Orders

## 2014-10-25 ENCOUNTER — Telehealth: Payer: Self-pay | Admitting: Internal Medicine

## 2014-10-25 ENCOUNTER — Telehealth: Payer: Self-pay | Admitting: *Deleted

## 2014-10-25 NOTE — Telephone Encounter (Signed)
PATIENT IN LOBBY °

## 2014-10-25 NOTE — Telephone Encounter (Signed)
Eliquis samples placed at the front desk for patient. 

## 2014-10-25 NOTE — Telephone Encounter (Signed)
Prescriptions and paperwork signed by Dr. Angelena Form and faxed to Denyse Amass.  I spoke with Becky Sax and let her know paperwork has been faxed.  Paperwork included prescriptions for metoprolol, eliquis,,simvastatin, escitalopram, furosemide and potassium.

## 2014-10-25 NOTE — Telephone Encounter (Signed)
We do not currently have any Spiriva handihaler or Dulera samples.  Patient notified. Nothing further needed.

## 2014-10-25 NOTE — Telephone Encounter (Signed)
Pt called back & wants to leave another call back # [phone]

## 2014-10-25 NOTE — Telephone Encounter (Signed)
LMTCb x 2 

## 2014-10-26 ENCOUNTER — Encounter: Payer: Self-pay | Admitting: Internal Medicine

## 2014-10-26 NOTE — Telephone Encounter (Signed)
ATC pt. Mailbox full. WCB.

## 2014-10-26 NOTE — Telephone Encounter (Signed)
Dr Chase Caller please advise on pt e-mail. Pt is needing an alternative medication. Pt lost her Medicaid insurance and she cannot afford her Dulera and Spiriva handihaler. Wanting to know if there is anything that can be prescribed in the meantime that might be cheaper - a combo medication so that she does not have 2 sep inhalers?

## 2014-10-26 NOTE — Telephone Encounter (Signed)
The triple mdi inhaler is still in works. For now 2 mdi  You can give her 2 months samples  Of a Stiolto/ANoro + Flovent/Asmanex/Pulmicort/Aerospan/QVAR  Or a   Tudorza/Incruse/Spiriva + Dulera/advair/breo low dose/symbicort  THanks   Dr. Brand Males, M.D., [facility].C.P Pulmonary and Critical Care Medicine Staff Physician [place] [facility] Pager: [phone], If no answer or between  15:00h - 7:00h: call [phone]  10/26/2014 4:25 PM

## 2014-10-27 ENCOUNTER — Other Ambulatory Visit: Payer: Self-pay | Admitting: *Deleted

## 2014-10-27 MED ORDER — UMECLIDINIUM BROMIDE 62.5 MCG/INH IN AEPB: 1.0000 | INHALATION_SPRAY | Freq: Every day | RESPIRATORY_TRACT | Status: DC

## 2014-10-27 MED ORDER — FLUTICASONE FUROATE-VILANTEROL 100-25 MCG/INH IN AEPB: 1.0000 | INHALATION_SPRAY | Freq: Every day | RESPIRATORY_TRACT | Status: DC

## 2014-10-27 NOTE — Telephone Encounter (Signed)
Patient needs samples for Spiriva and Dulera.  We do not have any samples for those.  Patient has applied for patient assistance on Spiriva and Dulera, waiting on approval.  Is there anything that patient can get samples of that would be similar to Spiriva and Dulera that she can have until she receives approval for patient assistance program.   MR - Please advise.

## 2014-10-27 NOTE — Telephone Encounter (Signed)
Per pt e-mail MR rec's that she be given samples to help cover her until her patient assistance is approved.  We can give her what samples we have but cannot guarantee that a 2 month supply can be given at this time.   Brand Males, MD at 10/26/2014 4:24 PM     Status: Signed       Expand All Collapse All   The triple mdi inhaler is still in works. For now 2 mdi  You can give her 2 months samples  Of a Stiolto/ANoro + Flovent/Asmanex/Pulmicort/Aerospan/QVAR  Or a   Tudorza/Incruse/Spiriva + Dulera/advair/breo low dose/symbicort  THanks        Incruse samples and Breo samples have been logged and placed up front to pick up.  Pt will need to be shown how to use these inhalers when she picks up.  Pt aware to use these inhalers until she gets an approval for her Spiriva and Dulera.  Nothing further needed.

## 2014-10-27 NOTE — Telephone Encounter (Signed)
I repplied to this yesterday. See "patient email" that ws sent to triage

## 2014-11-01 IMAGING — CT CT CHEST W/O CM
2 of 4 series · 15 of 36 positions shown, 18 images · non-contrast
Comparison: 01/28/2014

CLINICAL DATA: History of severe COPD. Right lung nodule,
follow-up. Recent left nephrectomy for renal cell carcinoma. Smoker.

EXAM:
CT CHEST WITHOUT CONTRAST
TECHNIQUE: Multidetector CT imaging of the chest was performed following the
standard protocol without IV contrast..

[Series 2: chest routine with · axial · 0.71mm/px · z∈[-298,-44]mm · 12 of 61 slices shown, 15 images]
[im 5/61  mediastinal]
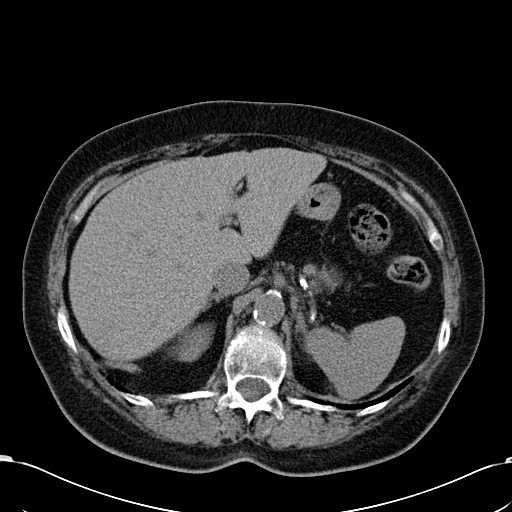
[im 5/61  lung]
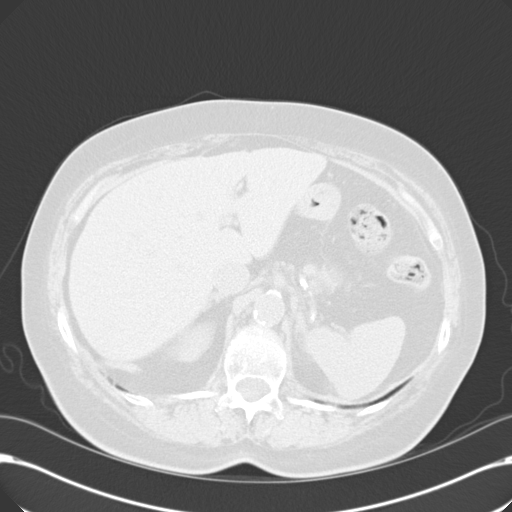
[im 10/61  lung]
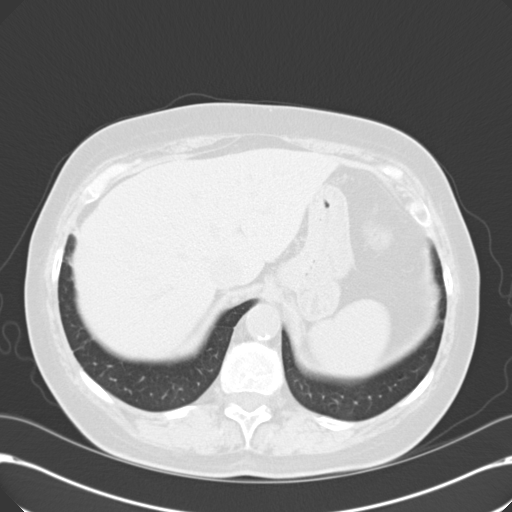
[im 14/61  lung]
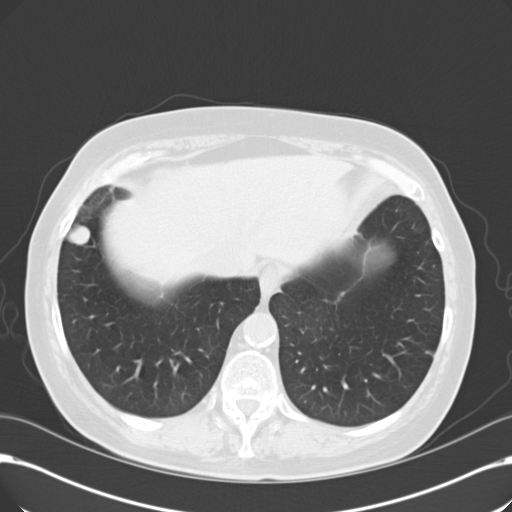
[im 19/61  lung]
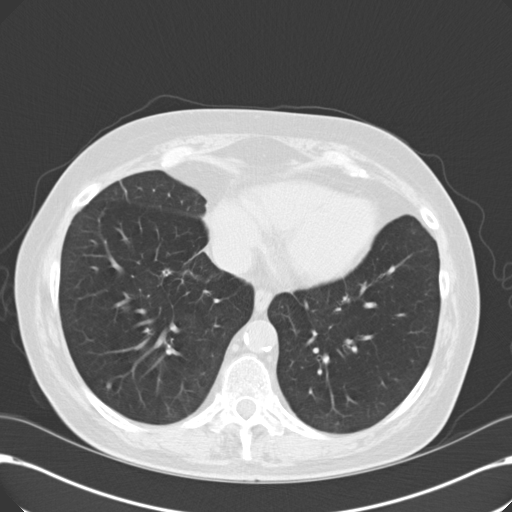
[im 24/61  mediastinal]
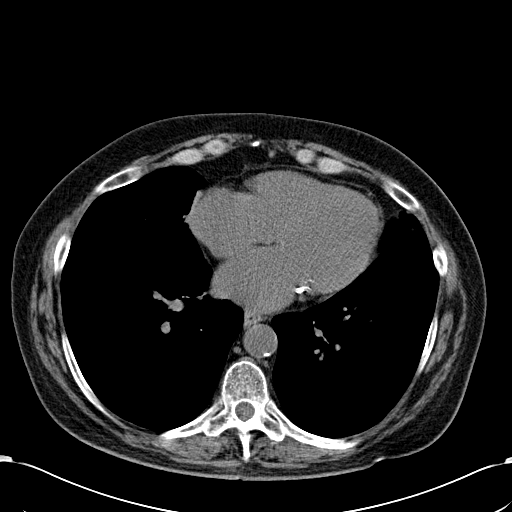
[im 24/61  lung]
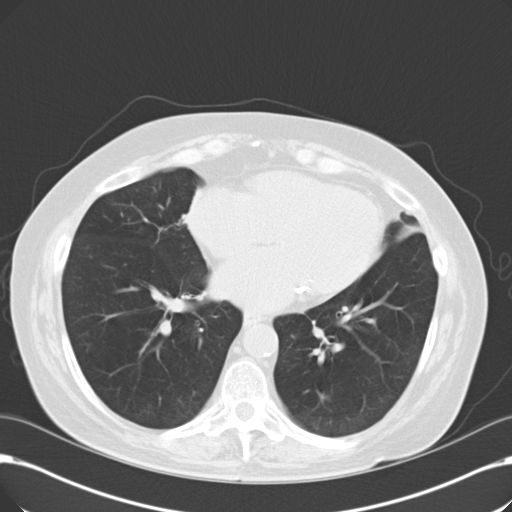
[im 28/61  lung]
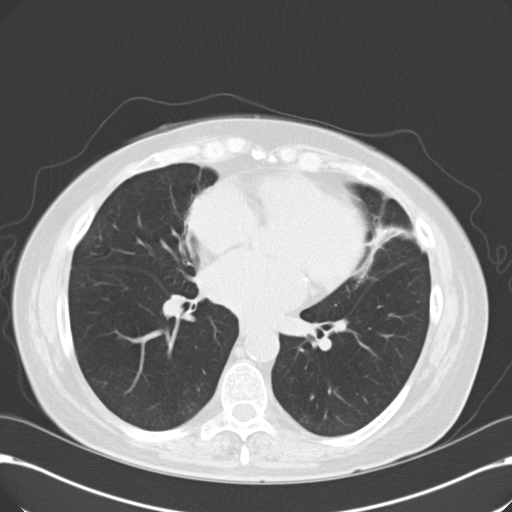
[im 33/61  lung]
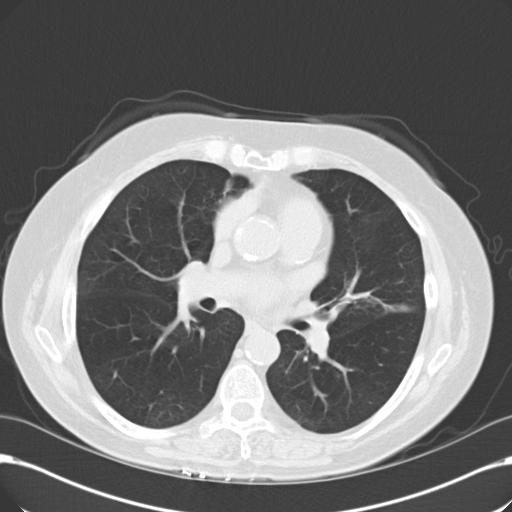
[im 37/61  lung]
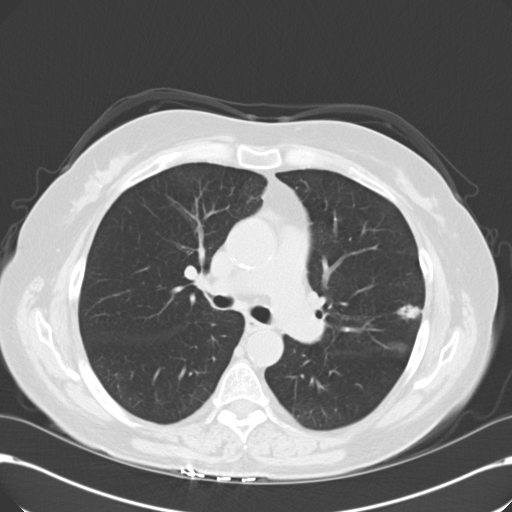
[im 42/61  mediastinal]
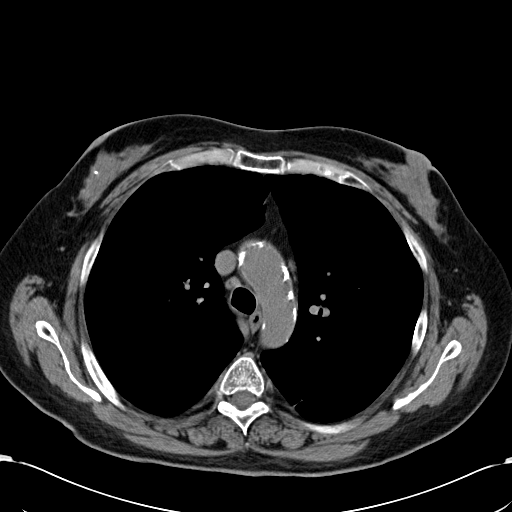
[im 42/61  lung]
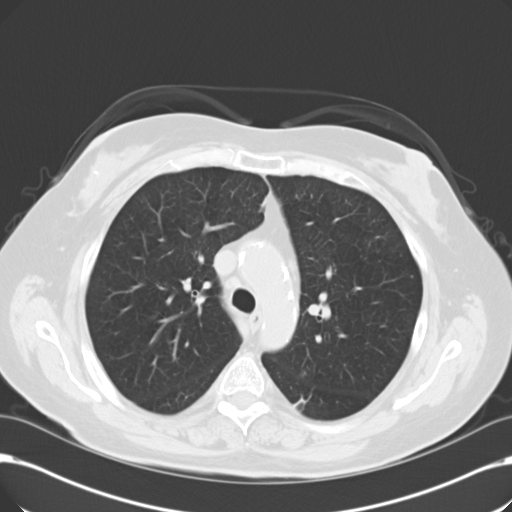
[im 47/61  lung]
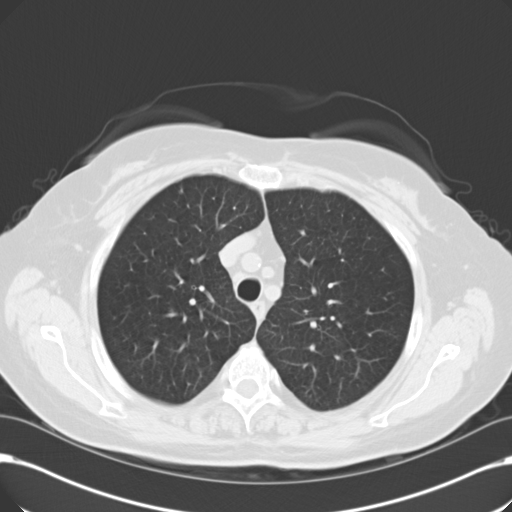
[im 51/61  lung]
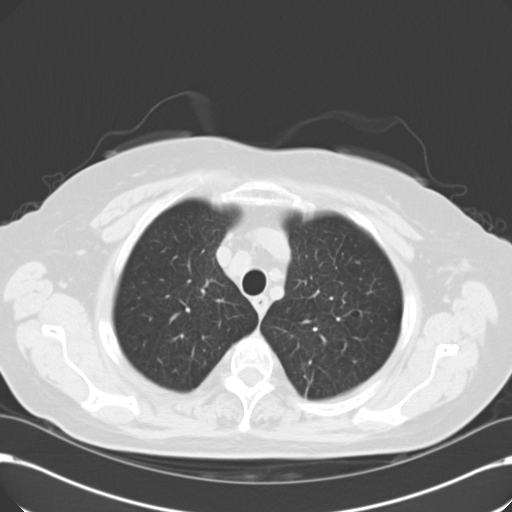
[im 56/61  lung]
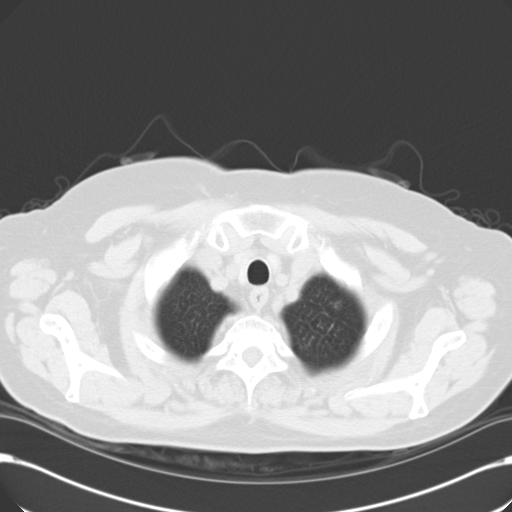

[Series 602: cor · coronal · 0.71mm/px · 3 of 111 slices shown]
[im 23/111  lung]
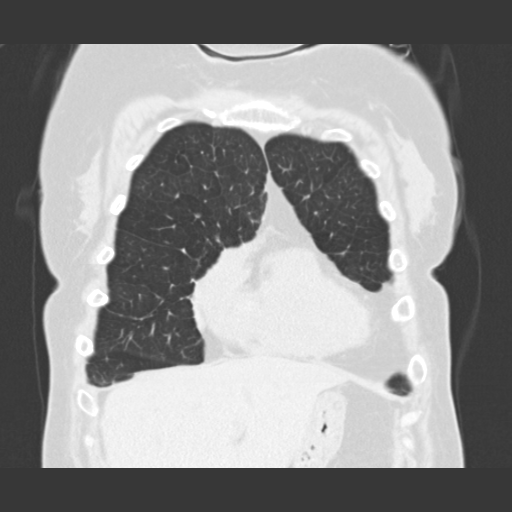
[im 45/111  lung]
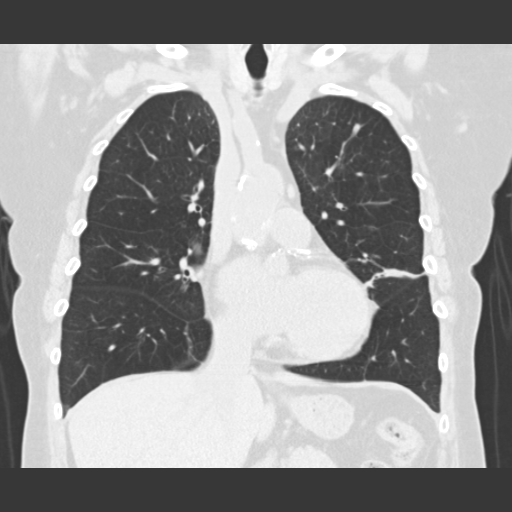
[im 67/111  lung]
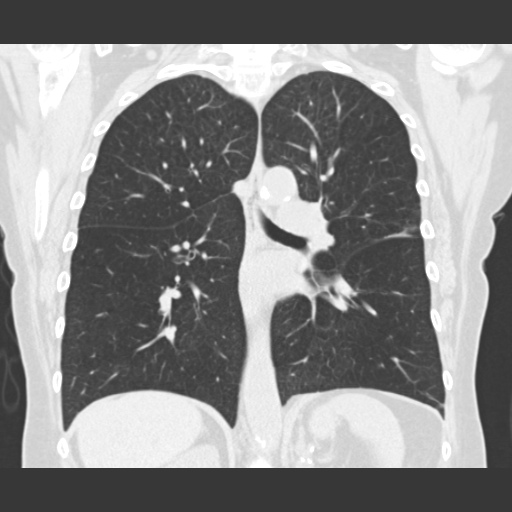

[15 of 36 positions shown; findings below may reference images not displayed]

FINDINGS: Emphysematous changes noted throughout the lungs. Multiple bilateral
pulmonary nodules. Nodule in the right lower lobe just above the
right hemidiaphragm laterally measures 15 mm in greatest diameter
compared with 17 mm previously. Peripheral nodule posteriorly in the
left upper lobe on image 25 measures up to 17 mm compared with 15 mm
previously. 5 mm nodule in the left upper lobe/apex on image 12 is
stable. The previously seen 2-3 mm right middle lobe nodule
peripherally is no longer visualized. Posterior right lower lobe
nodule on image 42 measures 5 mm, better seen on today's study as
this was partially obscured previously, but is stable.

No new or enlarging pulmonary nodules.

Heart is normal size. Aorta is normal caliber. Densely calcified
coronary arteries and aorta. No aneurysm. No mediastinal, hilar, or
axillary adenopathy. Chest wall soft tissues are unremarkable.
Imaging into the upper abdomen shows no acute findings.

No acute bony abnormality or focal bone lesion.
IMPRESSION: Numerous bilateral pulmonary nodules, not significantly changed
since prior study.

COPD.

Densely calcified coronary arteries and aorta.

## 2014-11-07 ENCOUNTER — Telehealth: Payer: Self-pay | Admitting: Internal Medicine

## 2014-11-07 MED ORDER — UMECLIDINIUM BROMIDE 62.5 MCG/INH IN AEPB: 1.0000 | INHALATION_SPRAY | Freq: Every day | RESPIRATORY_TRACT | Status: DC

## 2014-11-07 NOTE — Telephone Encounter (Signed)
Called made pt aware RX sent to the pharmacy for incruse. Nothing further needed

## 2014-12-21 ENCOUNTER — Telehealth: Payer: Self-pay | Admitting: Internal Medicine

## 2014-12-21 MED ORDER — FLUTICASONE FUROATE-VILANTEROL 100-25 MCG/INH IN AEPB: 1.0000 | INHALATION_SPRAY | Freq: Every day | RESPIRATORY_TRACT | Status: DC

## 2014-12-21 NOTE — Telephone Encounter (Signed)
Spoke with pt, requesting breo refill to [facility].  This has been sent.  Nothing further needed.

## 2015-01-01 ENCOUNTER — Telehealth: Payer: Self-pay | Admitting: Internal Medicine

## 2015-01-01 NOTE — Telephone Encounter (Signed)
ATC PT line rang numerous times. WCB

## 2015-01-02 NOTE — Telephone Encounter (Signed)
Called spoke with pt. Gave CT # to r/s appt. Nothing further needed

## 2015-01-05 ENCOUNTER — Telehealth: Payer: Self-pay | Admitting: Internal Medicine

## 2015-01-05 NOTE — Telephone Encounter (Signed)
Called Denyse Amass who is taking care of all the pt's pt assistance forms. LMTCB for Sonja to relay the mailing address to send pt's forms.

## 2015-01-09 NOTE — Telephone Encounter (Signed)
Pt has [place] [employer]  [place] PA form completed and faxed to [facility] for MD signature.

## 2015-01-09 NOTE — Telephone Encounter (Signed)
Sonja Returned call Please mail to: [place] [address], [place] [address] [place]

## 2015-01-10 ENCOUNTER — Ambulatory Visit (INDEPENDENT_AMBULATORY_CARE_PROVIDER_SITE_OTHER)
Admission: RE | Admit: 2015-01-10 | Discharge: 2015-01-10 | Disposition: A | Discharge status: Home / Self Care | Payer: Medicaid Other | Source: Ambulatory Visit | Attending: Internal Medicine | Admitting: Internal Medicine

## 2015-01-10 DIAGNOSIS — R911 Solitary pulmonary nodule: Secondary | ICD-10-CM

## 2015-01-15 ENCOUNTER — Telehealth: Payer: Self-pay | Admitting: Internal Medicine

## 2015-01-15 NOTE — Telephone Encounter (Signed)
Pt assistance information placed in outgoing mail on 7.8.16. Copy made of forms and placed in scan folder. Nothing further need at this time as Allison [name] is over pt's case.

## 2015-01-15 NOTE — Telephone Encounter (Signed)
Called and spoke to pt. Informed her of the results and recs per MR. Appt made with MR on 7.26.16. Pt verbalized understanding.   Will call CT on 7.12.16 to get super D.

## 2015-01-15 NOTE — Telephone Encounter (Signed)
CT chest   IMPRESSION: 1. Slight interval enlargement of pulmonary nodules as described above. No new pulmonary lesions. 2. Stable small scattered mediastinal and hilar lymph nodes but no mass or overt adenopathy. 3. No worrisome bone lesions to suggest osseous metastatic disease.   Electronically Signed  By: Marijo Sanes M.D.  On: 01/10/2015 14:18   Give her first avail to discuss Also , please see if they can make SUPER D of this and leave on my desk.   Plese reply to this phone message when task complete

## 2015-01-18 NOTE — Telephone Encounter (Signed)
Called and spoke to Langley at [place]. Super D disc is to be sent to [facility]. Once received will place on MR's desk.

## 2015-01-23 NOTE — Telephone Encounter (Signed)
Super D CT chest has been received and placed on MR's computer cart.   Will forward to MR as FYI.

## 2015-01-30 ENCOUNTER — Ambulatory Visit (INDEPENDENT_AMBULATORY_CARE_PROVIDER_SITE_OTHER): Payer: Medicaid Other | Admitting: Internal Medicine

## 2015-01-30 ENCOUNTER — Encounter: Payer: Self-pay | Admitting: Internal Medicine

## 2015-01-30 VITALS — BP 144/90 | HR 105 | Ht 67.0 in | Wt 192.8 lb

## 2015-01-30 DIAGNOSIS — R918 Other nonspecific abnormal finding of lung field: Secondary | ICD-10-CM

## 2015-01-30 DIAGNOSIS — J449 Chronic obstructive pulmonary disease, unspecified: Secondary | ICD-10-CM

## 2015-01-30 NOTE — Patient Instructions (Addendum)
ICD-9-CM ICD-10-CM   1. COPD, severe - MS phenotype 496 J44.9   2. Multiple lung nodules 793.19 R91.8    #COPD  - stable  - continue Spiriva and Dulera as before  #Multiple lung nodules - These are worse in July 2016 and compared to fall 2015 in summer 2015 - Concern is that this is representative of renal cell cancer - Refer to Dr. Alen Blew oncology - Burnis Medin discuss with oncology and see if you need navigation bronchoscopy guided biopsy    #Followup  3 months

## 2015-01-30 NOTE — Progress Notes (Signed)
Subjective:    Patient ID: Allison [name], female    DOB: 19-Jan-1951, 64 y.o.   MRN: [id number]  HPI   #COPD  - gold stage 3 - MS phenotpe - medicaid refused rehab  #Smoking   reports that she quit smoking about 3 years ago. Her smoking use included Cigarettes. She has a 45 pack-year smoking history. She has never used smokeless tobacco.  #imgaging data - May 20-12: clear cxr. No CT data - Dec 2014: she is open for low dose CT but will save $  #Alpha 1   - MS  #Thrushs  - July 2014 on symbicort - dec 2014 on bre0   #Lung nodule 1cm RLL - 01/28/14 in setting of kidney cancer left side   #RENAL CELL CARCINOMA, 6.5 CM, EXTENDING INTO PROXIMAL RENAL VEIN. - LEFT SIDE, s/p nephrectomy 03/22/14 - MARGINS NOT INVOLVED. - ADRENAL GLAND FREE OF TUMO        OV 04/04/2014  Chief Complaint  Patient presents with  . Follow-up    Pt had left nephrectomy 2 weeks ago. Pt states she has intermitent DOE. Pt denies cough and CP/tightness. Pt states she is still wearing nocturnal O2.    OV   COPD: saw her last in august She is s.p nephretomy left side for renal cell CA 03/22/14. Post op course went well from pulmonary stand point. Some abd wound non healing being followed by Dr Tresa Moore. COPD istself is  Stable. COntinues with ? Nocturnal o2, daily scheduled spiriva and dulera. UPtodate with vaccines. Main issue is post op fatigue but she is improving from it  SMoking:  reports that she quit smoking about 4 years ago. Her smoking use included Cigarettes. She has a 45 pack-year smoking history. She has never used smokeless tobacco.   L#Lung nodule Right lower lobe 1cm on CT chest 01/28/14- presumed due to kidney cancer   - this was noted during renal cell CA workup. SHe needs fu CT now but wants to defer for a few weeks to help her get over fatigue   #NEw - exam shows scratch marks on forearms; due to Cat. She says she cleans with antiseptic. No evidence of infetion   05/17/2014 ER  follow up  Patient returns for a follow-up from a recent emergency room visit. She was seen on November 4 for COPD flare with bronchitis. Chest x-ray showed no pneumonia with positive changes of bronchitis. She was treated with IV Solu-Medrol, and discharged on a Z-Pak and a prednisone taper  Feels breathing is back to baseline except for some occasional hoarseness.  finished zpak and prednisone yesterday.  She denies any chest pain, orthopnea, PND or leg swelling Remains on Dulera and Spiriva   Underwent left nephrectomy(renal cell carcinoma)  03/2014 has done well post op.   Has suspicious bilateral lung nodules that are being followed by serial CT . Next CT is due in 07/2014 .    OV 01/30/2015  Chief Complaint  Patient presents with  . Follow-up    Pt here after CT chest (super D). Pt states her work of breathing has increased but thinks it's due to the weather. Pt denies cough and CP/tightness.    :  COPD: She's now regained insurance. She is now on [place]. COPD stable. Some baseline shortness of breath and cough. SMoking in remission x 5 years  Lung nodule (renal cancer hx +, left nephrectomy summer 2015 - Dr Tresa Moore) - multiple in July 2015t and  OCt 2015 CT chest. THese are now enlarged in juy 2016 reported as below. I personally visualized image. Radiologist is concerned for metastasis from renal cell cancer. She tells me that she has an appointment with the urologist in October 2016. She has not seen oncology and November 2016 after she lost her insurance. She has no follow-up appointment with them. Apparently November 2015 she was placed on expectant follow-up   Now CT 01/10/15  Slight interval enlargement of pulmonary nodules consistent with metastatic disease.  Left upper lobe nodule on image 11 measures 5 mm and is stable.  Left upper lobe pulmonary nodule on image number 23 measures 16.0 x 10.5 x 11.0 mm and previously measured 17.0 x 10.0 x 7.5 mm. This has a  more solid than semi-solid appearance, as it did on the prior study (particularly on the sagittal images).  Right lower lobe pulmonary nodule on image 39 measures 6.5 mm and previously measured 5 mm.  Right lower lobe nodule at the right lung base on image number 46 measures 16.5 x 16.5 mm and previously measured 15 x 13 mm.  No new pulmonary nodules.  Upper abdomen: No significant findings.  IMPRESSION: 1. Slight interval enlargement of pulmonary nodules as described above. No new pulmonary lesions. 2. Stable small scattered mediastinal and hilar lymph nodes but no mass or overt adenopathy. 3. No worrisome bone lesions to suggest osseous metastatic disease. 4. Stable advanced atherosclerotic calcifications involving the aorta and coronary arteries.   Electronically Signed  By: Marijo Sanes M.D.  On: 01/10/2015 14:18      Current outpatient prescriptions:  .  albuterol (PROVENTIL HFA;VENTOLIN HFA) 108 (90 BASE) MCG/ACT inhaler, Inhale 2 puffs into the lungs every 6 (six) hours as needed for wheezing or shortness of breath (shortness of breath)., Disp: 1 Inhaler, Rfl: 3 .  apixaban (ELIQUIS) 5 MG TABS tablet, Take 5 mg by mouth 2 (two) times daily., Disp: , Rfl:  .  diphenhydramine-acetaminophen (TYLENOL PM) 25-500 MG TABS, Take 2 tablets by mouth daily as needed (for sleep). , Disp: , Rfl:  .  escitalopram (LEXAPRO) 10 MG tablet, Take 1 tablet (10 mg total) by mouth at bedtime., Disp: 30 tablet, Rfl: 6 .  furosemide (LASIX) 20 MG tablet, Take 1 tablet (20 mg total) by mouth every morning., Disp: 30 tablet, Rfl: 6 .  metoprolol (LOPRESSOR) 50 MG tablet, TAKE 1 & 1/2 TABLETS TWICE DAILY., Disp: 90 tablet, Rfl: 3 .  mometasone-formoterol (DULERA) 200-5 MCG/ACT AERO, Inhale 2 puffs into the lungs daily. Take 2 puffs first thing in am and then another 2 puffs about 12 hours later., Disp: , Rfl:  .  potassium chloride (K-DUR) 10 MEQ tablet, Take 10 mEq by mouth every  morning. With the lasix, Disp: , Rfl:  .  simvastatin (ZOCOR) 20 MG tablet, Take 1 tablet (20 mg total) by mouth every evening., Disp: 30 tablet, Rfl: 6 .  tiotropium (SPIRIVA) 18 MCG inhalation capsule, Place 1 capsule (18 mcg total) into inhaler and inhale every morning., Disp: 30 capsule, Rfl: 6   Review of Systems  Constitutional: Negative for fever and unexpected weight change.  HENT: Negative for congestion, dental problem, ear pain, nosebleeds, postnasal drip, rhinorrhea, sinus pressure, sneezing, sore throat and trouble swallowing.   Eyes: Negative for redness and itching.  Respiratory: Positive for shortness of breath. Negative for cough, chest tightness and wheezing.   Cardiovascular: Negative for palpitations and leg swelling.  Gastrointestinal: Negative for nausea and vomiting.  Genitourinary: Negative for  dysuria.  Musculoskeletal: Negative for joint swelling.  Skin: Negative for rash.  Neurological: Negative for headaches.  Hematological: Does not bruise/bleed easily.  Psychiatric/Behavioral: Negative for dysphoric mood. The patient is not nervous/anxious.    .    Objective:   Physical Exam  Constitutional: She is oriented to person, place, and time. She appears well-developed and well-nourished. No distress.  HENT:  Head: Normocephalic and atraumatic.  Right Ear: External ear normal.  Left Ear: External ear normal.  Mouth/Throat: Oropharynx is clear and moist. No oropharyngeal exudate.  Eyes: Conjunctivae and EOM are normal. Pupils are equal, round, and reactive to light. Right eye exhibits no discharge. Left eye exhibits no discharge. No scleral icterus.  Neck: Normal range of motion. Neck supple. No JVD present. No tracheal deviation present. No thyromegaly present.  Cardiovascular: Normal rate, regular rhythm, normal heart sounds and intact distal pulses.  Exam reveals no gallop and no friction rub.   No murmur heard. Pulmonary/Chest: Effort normal and breath sounds  normal. No respiratory distress. She has no wheezes. She has no rales. She exhibits no tenderness.  Abdominal: Soft. Bowel sounds are normal. She exhibits no distension and no mass. There is no tenderness. There is no rebound and no guarding.  Musculoskeletal: Normal range of motion. She exhibits no edema or tenderness.  Lymphadenopathy:    She has no cervical adenopathy.  Neurological: She is alert and oriented to person, place, and time. She has normal reflexes. No cranial nerve deficit. She exhibits normal muscle tone. Coordination normal.  Skin: Skin is warm and dry. No rash noted. She is not diaphoretic. No erythema. No pallor.  Psychiatric: She has a normal mood and affect. Her behavior is normal. Judgment and thought content normal.  Vitals reviewed.   Filed Vitals:   01/30/15 1527  BP: 144/90  Pulse: 105  Height: 5\' 7"  (1.702 m)  Weight: 192 lb 12.8 oz (87.454 kg)  SpO2: 95%         Assessment & Plan:     ICD-9-CM ICD-10-CM   1. COPD, severe - MS phenotype 496 J44.9   2. Multiple lung nodules 793.19 R91.8     #COPD  - stable  - continue Spiriva and Dulera as before  #Multiple lung nodules - These are worse in July 2016 and compared to fall 2015 in summer 2015 - Concern is that this is representative of renal cell cancer - Refer to Dr. Alen Blew oncology - [facility]in discuss with oncology and see if you need navigation bronchoscopy guided biopsy - discussed with Dr Alen Blew over phone after patient left - he reviewed CT. He will call patient in ASAP for appt. He does not need biopsy as pre-test prop is veritually certain that these lung nodules are malignant renal cell CA    #Followup  3 months   Dr. Brand Males, M.D., [facility].C.P Pulmonary and Critical Care Medicine Staff Physician [place] [facility] Pager: [phone], If no answer or between  15:00h - 7:00h: call [phone]  01/30/2015 4:05 PM

## 2015-01-31 ENCOUNTER — Other Ambulatory Visit: Payer: Self-pay | Admitting: Oncology

## 2015-01-31 ENCOUNTER — Telehealth: Payer: Self-pay | Admitting: Oncology

## 2015-01-31 NOTE — Telephone Encounter (Signed)
Lft msg for pt confirming MD visit per 07/27 POF, mailed schedule to pt... KJ

## 2015-02-11 ENCOUNTER — Telehealth: Payer: Self-pay | Admitting: Internal Medicine

## 2015-02-11 NOTE — Telephone Encounter (Signed)
Allison [name]  Let [name] know that I d.w cancer doc and there eis no need right now to bx those lung nodules due to overwhleming suspicion it is spread of her kidney cancer to her lungs (she know this possibility). She has to keep fu with him mid aug 2016  Thanks  Dr. Brand Males, M.D., [facility].C.P Pulmonary and Critical Care Medicine Staff Physician [place] [facility] Pager: [phone], If no answer or between  15:00h - 7:00h: call [phone]  02/11/2015 6:17 PM

## 2015-02-12 ENCOUNTER — Ambulatory Visit (INDEPENDENT_AMBULATORY_CARE_PROVIDER_SITE_OTHER): Payer: Medicaid Other | Admitting: Physician Assistant

## 2015-02-12 ENCOUNTER — Encounter: Payer: Self-pay | Admitting: Physician Assistant

## 2015-02-12 VITALS — BP 142/70 | HR 95 | Ht 67.0 in | Wt 191.0 lb

## 2015-02-12 DIAGNOSIS — I255 Ischemic cardiomyopathy: Secondary | ICD-10-CM

## 2015-02-12 DIAGNOSIS — I2510 Atherosclerotic heart disease of native coronary artery without angina pectoris: Secondary | ICD-10-CM

## 2015-02-12 DIAGNOSIS — I481 Persistent atrial fibrillation: Secondary | ICD-10-CM

## 2015-02-12 DIAGNOSIS — I480 Paroxysmal atrial fibrillation: Secondary | ICD-10-CM

## 2015-02-12 DIAGNOSIS — I4819 Other persistent atrial fibrillation: Secondary | ICD-10-CM

## 2015-02-12 DIAGNOSIS — I739 Peripheral vascular disease, unspecified: Secondary | ICD-10-CM

## 2015-02-12 DIAGNOSIS — E785 Hyperlipidemia, unspecified: Secondary | ICD-10-CM | POA: Diagnosis not present

## 2015-02-12 DIAGNOSIS — I10 Essential (primary) hypertension: Secondary | ICD-10-CM

## 2015-02-12 LAB — LIPID PANEL
CHOL/HDL RATIO: 3
Cholesterol: 186 mg/dL (ref 0–200)
HDL: 53.3 mg/dL (ref >39.00)
LDL CALC: 109 mg/dL — AB (ref 0–99)
NonHDL: 132.88
Triglycerides: 119 mg/dL (ref 0.0–149.0)
VLDL: 23.8 mg/dL (ref 0.0–40.0)

## 2015-02-12 LAB — HEPATIC FUNCTION PANEL
ALT: 11 U/L (ref 0–35)
AST: 14 U/L (ref 0–37)
Albumin: 4 g/dL (ref 3.5–5.2)
Alkaline Phosphatase: 118 U/L — ABNORMAL HIGH (ref 39–117)
BILIRUBIN DIRECT: 0.1 mg/dL (ref 0.0–0.3)
BILIRUBIN TOTAL: 0.3 mg/dL (ref 0.2–1.2)
Total Protein: 7.6 g/dL (ref 6.0–8.3)

## 2015-02-12 MED ORDER — METOPROLOL TARTRATE 50 MG PO TABS: ORAL_TABLET | ORAL | Status: DC

## 2015-02-12 NOTE — Assessment & Plan Note (Addendum)
Unfortunately the patient is back in atrial fibrillation. She is asymptomatic with this. Her rate is a little fast at 90 bpm. Increase metoprolol to 100 mg in the morning 75 in the evening. I will discuss this patient with Dr.McAlhany to see if he wants to do another cardioversion. The patient has been on Eliquis since she developed atrial fib after kidney surgery last fall. She was in normal sinus rhythm in January 2016 at her last office visit here.

## 2015-02-12 NOTE — Patient Instructions (Signed)
Medication Instructions:  Your physician has recommended you make the following change in your medication:  1) Increase Metoprolol to 100mg  in the am and 75 mg in the pm    Labwork: Your physician recommends that you return for lab work today: LIPID/LIVER   Testing/Procedures: None ordered  Follow-Up: Your physician recommends that you schedule a follow-up appointment in: 3 weeks with Dr Angelena Form   Any Other Special Instructions Will Be Listed Below (If Applicable).

## 2015-02-12 NOTE — Assessment & Plan Note (Addendum)
Leg pain with walking unchanged. ABIs and carotid Doppler stable in January 2016

## 2015-02-12 NOTE — Assessment & Plan Note (Signed)
Patient is on Zocor. Check fasting lipid panel and LFTs.

## 2015-02-12 NOTE — Assessment & Plan Note (Signed)
BP stable.

## 2015-02-12 NOTE — Telephone Encounter (Signed)
lmtcb for pt.  

## 2015-02-12 NOTE — Assessment & Plan Note (Signed)
Stable without chest pain. Continue metoprolol

## 2015-02-12 NOTE — Assessment & Plan Note (Signed)
Stable without chest pain or heart failure.

## 2015-02-12 NOTE — Progress Notes (Signed)
Cardiology Office Note   Date:  02/12/2015   ID:  Allison [name], DOB 1950/12/04, MRN [id number]  PCP:  Pcp Not In System  Cardiologist:  Dr. Angelena Form  Chief Complaint: short of breath    History of Present Illness: Allison [name] is a 64 y.o. female who presents for follow-up. She has a history of CAD status post NSTEMI 2011 with respiratory failure, status post DES to the circumflex. Moderate residual LAD disease. EF was found to be 30% presumed to be nonischemic in etiology. Echo 08/14/09 EF 40-45%. Stress Myoview in August 2015 was without ischemia. Echo 01/28/14 normal LV function with trivial MR, MAD biatrial enlargement. She also has renal cell carcinoma status post radical left nephrectomy 03/2014. Postop she had atrial fibrillation and was started on Eliquis. She was cardioverted in 05/2014.  Patient also has severe COPD as well as lung nodules felt to be metastatic from her renal cell carcinoma. She also has hypertension, hyperlipidemia, left femoral to above-the-knee popliteal bypass 11/15/2010 followed by Dr. Trula Slade. Angiogram 12/2011 with patent left femoral-to-popliteal artery bypass graft with 50-60% left common femoral artery stenosis. Conservative management pursued secondary to small size of vessel.  Patient has done well since January. She denies any chest pain, palpitations, dizziness or presyncope. She has chronic dyspnea on exertion secondary to her COPD. Sometimes her legs hurt when she walks a long distance but it feels better if she leans on a grocery cart. She can't tell if she is in atrial fibrillation or not. She never had palpitations.    Past Medical History  Diagnosis Date  . COPD (chronic obstructive pulmonary disease)   . Tobacco abuse     hx of  . Coronary artery disease     a. 08/2009 NSTEMI s/p PCI/DES to LCX. Mod residual LAD dzs;  b. 02/2014 Nonischemic myoview.  . Hyperlipidemia   . Ischemic cardiomyopathy     a. Echo 08/14/09 with LVEF 40-45%;  b. 01/2014 Echo:  EF 55-60%, basal-mid inflat and inf HK, triv MR/TR, mod dil LA/RA.  Marland Kitchen A-fib     a. Dx 01/2014 - rate controlled. b. Recurrence 03/2014 in setting of renal cell carcinoma surgery.  . 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 Acute pyelonephritis 01/28/2014  . Hypertension   . PVD (peripheral vascular disease)     a. 11/2010 L Fem to above knee Pop bypass;  b. 12/2011 Angio: patent L fem->pop graft, LCFZ 50-60%.  . Pneumonia     hx of  . Anxiety   . History of kidney stones   . Carotid disease, bilateral     a. 11/2011 U/S: RICA 77-93, LICA <90.  . Lung nodule     a. 1cm RLL nodule - followed by pulmonology.  . Shortness of breath dyspnea     Past Surgical History  Procedure Laterality Date  . Coronary stent placement  2011  . Femoral bypass Left May 2012  . Lower extremity angiogram  December 16, 2011  . Cardiac catheterization    . Tonsillectomy  64 years old  . Robot assisted laparoscopic nephrectomy Left 03/22/2014    Procedure: ROBOTIC ASSISTED LAPAROSCOPIC RADICAL NEPHRECTOMY LEFT;  Surgeon: Alexis Frock, MD;  Location: [facility];  Service: Urology;  Laterality: Left;  . Cardioversion N/A 05/08/2014    Procedure: CARDIOVERSION;  Surgeon: Thayer Headings, MD;  Location: [facility];  Service: Cardiovascular;  Laterality: N/A;  . Lower extremity angiogram N/A 12/16/2011    Procedure: LOWER EXTREMITY ANGIOGRAM;  Surgeon: Serafina Mitchell, MD;  Location: [facility] CATH LAB;  Service: Cardiovascular;  Laterality: N/A;     Current Outpatient Prescriptions  Medication Sig Dispense Refill  . albuterol (PROVENTIL HFA;VENTOLIN HFA) 108 (90 BASE) MCG/ACT inhaler Inhale 2 puffs into the lungs every 6 (six) hours as needed for wheezing or shortness of breath (shortness of breath). 1 Inhaler 3  . apixaban (ELIQUIS) 5 MG TABS tablet Take 5 mg by mouth 2 (two) times daily.    . diphenhydramine-acetaminophen (TYLENOL PM) 25-500 MG TABS Take 2 tablets by mouth daily as needed (for sleep).     Marland Kitchen  escitalopram (LEXAPRO) 10 MG tablet Take 1 tablet (10 mg total) by mouth at bedtime. 30 tablet 6  . furosemide (LASIX) 20 MG tablet Take 1 tablet (20 mg total) by mouth every morning. 30 tablet 6  . metoprolol (LOPRESSOR) 50 MG tablet Take 100 mg(2 tablets) by mouth in the am and 75 mg(1 1/2 tablets) by mouth in the pm 315 tablet 3  . mometasone-formoterol (DULERA) 200-5 MCG/ACT AERO Inhale 2 puffs into the lungs daily. Take 2 puffs first thing in am and then another 2 puffs about 12 hours later.    . potassium chloride (K-DUR) 10 MEQ tablet Take 10 mEq by mouth every morning. With the lasix    . simvastatin (ZOCOR) 20 MG tablet Take 1 tablet (20 mg total) by mouth every evening. 30 tablet 6  . tiotropium (SPIRIVA) 18 MCG inhalation capsule Place 1 capsule (18 mcg total) into inhaler and inhale every morning. 30 capsule 6   No current facility-administered medications for this visit.    Allergies:   Ace inhibitors; Amoxicillin-pot clavulanate; and Codeine    Social History:  The patient  reports that she quit smoking about 5 years ago. Her smoking use included Cigarettes. She has a 45 pack-year smoking history. She has never used smokeless tobacco. She reports that she does not drink alcohol or use illicit drugs.   Family History:  The patient's family history includes Cancer in her father and mother; Cirrhosis in her father; Coronary artery disease in her mother; Heart attack in her mother; Heart disease in her mother; Hyperlipidemia in her mother; Hypertension in her mother; Other in her brother; Stroke in her maternal aunt.    ROS:  Please see the history of present illness.   Otherwise, review of systems are positive for none.   All other systems are reviewed and negative.    PHYSICAL EXAM: VS:  BP 142/70 mmHg  Pulse 95  Ht 5\' 7"  (1.702 m)  Wt 191 lb (86.637 kg)  BMI 29.91 kg/m2  SpO2 98% , BMI Body mass index is 29.91 kg/(m^2). GEN: Well nourished, well developed, in no acute  distress Neck: no JVD, HJR, carotid bruits, or masses Cardiac:  irreg irreg; no murmurs,gallop, rubs, thrill or heave,  Respiratory:  Decreased breath sounds clear to auscultation bilaterally, normal work of breathing GI: soft, nontender, nondistended, + BS MS: no deformity or atrophy Extremities: without cyanosis, clubbing, edema, Decreased distal pulses bilaterally.  Skin: warm and dry, no rash Neuro:  Strength and sensation are intact    EKG:  EKG is ordered today. The ekg ordered today demonstrates atrial fibrillation at 90 bpm, new since January 2016   Recent Labs: 03/22/2014: ALT 14 03/24/2014: Magnesium 1.9 03/30/2014: TSH 1.00 10/24/2014: BUN 14; Creatinine, Ser 1.09; Hemoglobin 13.0; Platelets 300.0; Potassium 3.9; Sodium 137    Lipid Panel    Component Value Date/Time  CHOL 171 11/30/2012 1122   TRIG 66.0 11/30/2012 1122   HDL 64.70 11/30/2012 1122   CHOLHDL 3 11/30/2012 1122   VLDL 13.2 11/30/2012 1122   LDLCALC 93 11/30/2012 1122   LDLDIRECT 180.5 09/02/2011 1011      Wt Readings from Last 3 Encounters:  02/12/15 191 lb (86.637 kg)  01/30/15 192 lb 12.8 oz (87.454 kg)  07/18/14 176 lb 3.2 oz (79.924 kg)      Other studies Reviewed: Additional studies/ records that were reviewed today include and review of the records demonstrates: Now CT 01/10/15  Slight interval enlargement of pulmonary nodules consistent with metastatic disease.   Left upper lobe nodule on image 11 measures 5 mm and is stable.   Left upper lobe pulmonary nodule on image number 23 measures 16.0 x 10.5 x 11.0 mm and previously measured 17.0 x 10.0 x 7.5 mm. This has a more solid than semi-solid appearance, as it did on the prior study (particularly on the sagittal images).   Right lower lobe pulmonary nodule on image 39 measures 6.5 mm and previously measured 5 mm.   Right lower lobe nodule at the right lung base on image number 46 measures 16.5 x 16.5 mm and previously measured 15 x  13 mm.   No new pulmonary nodules.   Upper abdomen:  No significant findings.   IMPRESSION: 1. Slight interval enlargement of pulmonary nodules as described above. No new pulmonary lesions. 2. Stable small scattered mediastinal and hilar lymph nodes but no mass or overt adenopathy. 3. No worrisome bone lesions to suggest osseous metastatic disease. 4. Stable advanced atherosclerotic calcifications involving the aorta and coronary arteries.   Stress myoview 02/11/14: Rest Procedure:  Myocardial perfusion imaging was performed at rest 45 minutes following the intravenous administration of Technetium 65m Sestamibi. Rest ECG: NSR - Normal EKG  Stress Procedure:  The patient received IV Lexiscan 0.4 mg over 15-seconds.  Technetium 93m Sestamibi injected at 30-seconds. The patient had sob, dizziness, and was hot with the Lexiscan injection. Quantitative spect images were obtained after a 45 minute delay. Stress ECG: No significant change from baseline ECG  QPS Raw Data Images:  There is interference from nuclear activity from structures below the diaphragm. This does not affect the ability to read the study. Stress Images:  There is a small severe area of attenuation of the inferior mid-base.  The uptake in the other regions is nromal.     Rest Images:  There is a small severe area of attenuation of the inferior mid-base.  The uptake in the other regions is nromal. Subtraction (SDS):  No evidence of ischemia.  There is a fixed inferior basal-mid scar. Transient Ischemic Dilatation (Normal <1.22):  1.17 Lung/Heart Ratio (Normal <0.45):  0.33  Quantitative Gated Spect Images QGS EDV:  80 ml QGS ESV:  35 ml  Impression Exercise Capacity:  Lexiscan with no exercise. BP Response:  Normal blood pressure response. Clinical Symptoms:  No significant symptoms noted. ECG Impression:  No significant ST segment change suggestive of ischemia. Comparison with Prior Nuclear Study: No significant  change from previous study from 11/11/12.    Overall Impression:  Low risk stress nuclear study .  There is evidence of a previouis mid-basal inferior MI.  There is no evidence of ischemia. .  LV Ejection Fraction: 56%.  LV Wall Motion:  There is mid-basal inferior severe hypokinesis.  The overall EF remains normal.    Echo 01/28/14: Left ventricle: The cavity size was normal. Wall  thickness was normal. Systolic function was normal. The estimated ejection fraction was in the range of 55% to 60%. Probable hypokinesis of the basal-midinferolateral and inferior myocardium. The study is not technically sufficient to allow evaluation of LV diastolic function. - Aortic valve: Mildly calcified annulus. Trileaflet; mildly calcified leaflets. There was no significant regurgitation. - Mitral valve: Calcified annulus. There was trivial regurgitation. - Left atrium: The atrium was moderately dilated. - Right atrium: The atrium was moderately dilated. Central venous pressure (est): 8 mm Hg. - Tricuspid valve: There was trivial regurgitation. - Pulmonary arteries: Systolic pressure could not be accurately estimated. - Pericardium, extracardiac: There was no pericardial effusion. Impressions: - Normal LV chamber size and wall thickness with LVEF 55-60%, probable hypokinesis of the mid to basal inferolateral wall. Indeterminate diastolic function in the setting of atrial fibrillation. Moderate biatrial enlargement. MAC with trivial mitral regurgitation. Unable to assess PASP.    ASSESSMENT AND PLAN: CAD, NATIVE VESSEL Stable without chest pain. Continue metoprolol  Paroxysmal atrial fibrillation Unfortunately the patient is back in atrial fibrillation. She is asymptomatic with this. Her rate is a little fast at 90 bpm. Increase metoprolol to 100 mg in the morning 75 in the evening. I will discuss this patient with Dr.McAlhany to see if he wants to do another cardioversion. The patient has been on  Eliquis since she developed atrial fib after kidney surgery last fall. She was in normal sinus rhythm in January 2016 at her last office visit here.  PAD (peripheral artery disease) Leg pain with walking unchanged. ABIs and carotid Doppler stable in January 2016  Ischemic cardiomyopathy Stable without chest pain or heart failure.  Essential hypertension BP stable  Hyperlipidemia Patient is on Zocor. Check fasting lipid panel and LFTs.     Sumner Boast, PA-C  02/12/2015 10:21 AM    [facility] [address], [place], [place]  [address] Phone: [phone]; Fax: [phone]

## 2015-02-12 NOTE — Progress Notes (Signed)
Can you let patient know Dr. Angelena Form isn't planning to cardiovert her at this time. F/u with him

## 2015-02-15 NOTE — Telephone Encounter (Signed)
lmtcb for pt.  

## 2015-02-16 NOTE — Telephone Encounter (Signed)
Called and spoke to pt. Informed her of the results and recs per MR. Pt verbalized understanding and denied any further questions or concerns at this time.   

## 2015-02-20 ENCOUNTER — Telehealth: Payer: Self-pay | Admitting: Cardiovascular Disease

## 2015-02-20 NOTE — Telephone Encounter (Signed)
Spoke with pt and appt made for her to see Dr. Angelena Form on August 29,2016 at 9:45.

## 2015-02-20 NOTE — Telephone Encounter (Signed)
Follow Up  Pt called states that she is waiting on the three week follow up appt with Dr. Angelena Form. She states that she was advised per the PA-C Estella Husk would authorize this follow up appt. The next available appointment is 10/24. Please assist

## 2015-02-21 ENCOUNTER — Telehealth: Payer: Self-pay | Admitting: Oncology

## 2015-02-21 ENCOUNTER — Ambulatory Visit (HOSPITAL_BASED_OUTPATIENT_CLINIC_OR_DEPARTMENT_OTHER): Payer: Medicaid Other | Admitting: Oncology

## 2015-02-21 VITALS — BP 153/80 | HR 99 | Temp 98.1°F | Resp 16 | Ht 67.0 in | Wt 192.5 lb

## 2015-02-21 DIAGNOSIS — C642 Malignant neoplasm of left kidney, except renal pelvis: Secondary | ICD-10-CM | POA: Diagnosis present

## 2015-02-21 DIAGNOSIS — R918 Other nonspecific abnormal finding of lung field: Secondary | ICD-10-CM

## 2015-02-21 DIAGNOSIS — C649 Malignant neoplasm of unspecified kidney, except renal pelvis: Secondary | ICD-10-CM

## 2015-02-21 DIAGNOSIS — C7800 Secondary malignant neoplasm of unspecified lung: Principal | ICD-10-CM

## 2015-02-21 NOTE — Telephone Encounter (Signed)
Gave avs & calendar for November.  °

## 2015-02-21 NOTE — Progress Notes (Signed)
Hematology and Oncology Follow Up Visit  Allison [name] [id number] June 09, 1951 64 y.o. 02/21/2015 3:06 PM Pcp Not In SystemNo ref. provider found   Principle Diagnosis: 64 year old woman with renal cell carcinoma diagnosed in July 2015. She presented with a 6.4 cm left kidney mass and pulmonary nodules. She has likely stage IV disease.   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   Current therapy: Observation surveillance.  Interim History: Allison [name] presents today for a follow-up visit. She is a very pleasant woman I saw in consultation in November 2016. She was supposed to follow up sooner but she have lost her medical coverage and now back to reestablish care. Since the last visit, she has not reported any major complaints. She does report occasional respiratory symptoms including dyspnea on exertion and occasional cough. This is mostly related to her COPD and she does require oxygen at nighttime. She has been followed up with pulmonary medicine and had a repeat imaging studies in July 2016. The CT scan did not show any major changes at this time.  Clinically, she feels very well. She does not report any headaches blurred vision or double vision or syncope. She does not report any fevers, chills, sweats or weight loss. She does not report any hemoptysis. She does report exertional dyspnea which is close to her baseline. She does not report any chest pain, palpitation, orthopnea, PND or leg edema. She does not report any nausea, vomiting, obstipation, diarrhea, hematochezia or melena. She does not report any hematuria, dysuria or frequency. She does not report any skeletal complaints such as arthralgias or myalgias or joint swelling. She does not report any lymphadenopathy or petechiae. She continues to be active and attends to activities of daily living. She have regained most of her  function at this time. Rest of her review of systems unremarkable.  Medications: I have reviewed the patient's current medications.  Current Outpatient Prescriptions  Medication Sig Dispense Refill  . albuterol (PROVENTIL HFA;VENTOLIN HFA) 108 (90 BASE) MCG/ACT inhaler Inhale 2 puffs into the lungs every 6 (six) hours as needed for wheezing or shortness of breath (shortness of breath). 1 Inhaler 3  . apixaban (ELIQUIS) 5 MG TABS tablet Take 5 mg by mouth 2 (two) times daily.    . diphenhydramine-acetaminophen (TYLENOL PM) 25-500 MG TABS Take 2 tablets by mouth daily as needed (for sleep).     Marland Kitchen escitalopram (LEXAPRO) 10 MG tablet Take 1 tablet (10 mg total) by mouth at bedtime. 30 tablet 6  . furosemide (LASIX) 20 MG tablet Take 1 tablet (20 mg total) by mouth every morning. 30 tablet 6  . metoprolol (LOPRESSOR) 50 MG tablet Take 100 mg(2 tablets) by mouth in the am and 75 mg(1 1/2 tablets) by mouth in the pm 315 tablet 3  . mometasone-formoterol (DULERA) 200-5 MCG/ACT AERO Inhale 2 puffs into the lungs daily. Take 2 puffs first thing in am and then another 2 puffs about 12 hours later.    . potassium chloride (K-DUR) 10 MEQ tablet Take 10 mEq by mouth every morning. With the lasix    . simvastatin (ZOCOR) 20 MG tablet Take 1 tablet (20 mg total) by mouth every evening. 30 tablet 6  . tiotropium (SPIRIVA) 18 MCG inhalation capsule Place 1 capsule (18 mcg total) into inhaler and inhale every morning. 30 capsule 6   No current facility-administered medications for this visit.  Allergies:  Allergies  Allergen Reactions  . Ace Inhibitors Cough  . Amoxicillin-Pot Clavulanate Hives  . Codeine Nausea And Vomiting    Past Medical History, Surgical history, Social history, and Family History were reviewed and updated.   Physical Exam: Blood pressure 153/80, pulse 99, temperature 98.1 F (36.7 C), temperature source Oral, resp. rate 16, height 5\' 7"  (1.702 m), weight 192 lb 8 oz (87.317  kg), SpO2 98 %. ECOG: 1 General appearance: alert and cooperative Head: Normocephalic, without obvious abnormality Neck: no adenopathy Lymph nodes: Cervical, supraclavicular, and axillary nodes normal. Heart:regular rate and rhythm, S1, S2 normal, no murmur, click, rub or gallop Lung:chest clear, no wheezing, rales, normal symmetric air entry Abdomin: soft, non-tender, without masses or organomegaly EXT:no erythema, induration, or nodules   Lab Results: Lab Results  Component Value Date   WBC 10.4 10/24/2014   HGB 13.0 10/24/2014   HCT 38.9 10/24/2014   MCV 88.9 10/24/2014   PLT 300.0 10/24/2014     Chemistry      Component Value Date/Time   NA 137 10/24/2014 0926   K 3.9 10/24/2014 0926   CL 104 10/24/2014 0926   CO2 28 10/24/2014 0926   BUN 14 10/24/2014 0926   CREATININE 1.09 10/24/2014 0926      Component Value Date/Time   CALCIUM 9.6 10/24/2014 0926   ALKPHOS 118* 02/12/2015 1019   AST 14 02/12/2015 1019   ALT 11 02/12/2015 1019   BILITOT 0.3 02/12/2015 1019       Radiological Studies:  EXAM: CT CHEST WITHOUT CONTRAST  TECHNIQUE: Multidetector CT imaging of the chest was performed following the standard protocol without IV contrast.  COMPARISON: CT scan 01/28/2014 and 04/25/2014  FINDINGS: Chest wall: No breast masses, supraclavicular or axillary lymphadenopathy. The thyroid gland appears normal. The bony thorax is intact. No Destructive bone lesions or spinal canal compromise.  Mediastinum: The heart is normal in size. No pericardial effusion. Stable aortic and coronary artery calcifications. There are numerous small mediastinal and hilar lymph nodes which are stable. No mass or overt adenopathy. The esophagus is grossly normal. Small hiatal hernia.  Lungs/ pleura: Stable emphysematous changes. No new/acute pulmonary findings such as infiltrate or edema.  Slight interval enlargement of pulmonary nodules consistent with metastatic  disease.  Left upper lobe nodule on image 11 measures 5 mm and is stable.  Left upper lobe pulmonary nodule on image number 23 measures 16.0 x 10.5 x 11.0 mm and previously measured 17.0 x 10.0 x 7.5 mm. This has a more solid than semi-solid appearance, as it did on the prior study (particularly on the sagittal images).  Right lower lobe pulmonary nodule on image 39 measures 6.5 mm and previously measured 5 mm.  Right lower lobe nodule at the right lung base on image number 46 measures 16.5 x 16.5 mm and previously measured 15 x 13 mm.  No new pulmonary nodules.  Upper abdomen: No significant findings.  IMPRESSION: 1. Slight interval enlargement of pulmonary nodules as described above. No new pulmonary lesions. 2. Stable small scattered mediastinal and hilar lymph nodes but no mass or overt adenopathy. 3. No worrisome bone lesions to suggest osseous metastatic disease. 4. Stable advanced atherosclerotic calcifications involving the aorta and coronary arteries.   Electronically Signed  By: Marijo Sanes M.D.  On: 01/10/2015 14:18           Vitals     Height Weight BMI (Calculated)    5\' 7"  (1.702 m) 192 lb 12.8 oz (87.454  kg) 30.3      Interpretation Summary     CLINICAL DATA: Followup pulmonary nodules. History of renal cell carcinoma status post left nephrectomy.  EXAM: CT CHEST WITHOUT CONTRAST  TECHNIQUE: Multidetector CT imaging of the chest was performed following the standard protocol without IV contrast.  COMPARISON: CT scan 01/28/2014 and 04/25/2014  FINDINGS: Chest wall: No breast masses, supraclavicular or axillary lymphadenopathy. The thyroid gland appears normal. The bony thorax is intact. No Destructive bone lesions or spinal canal compromise.  Mediastinum: The heart is normal in size. No pericardial effusion. Stable aortic and coronary artery calcifications. There are numerous small mediastinal and hilar lymph  nodes which are stable. No mass or overt adenopathy. The esophagus is grossly normal. Small hiatal hernia.  Lungs/ pleura: Stable emphysematous changes. No new/acute pulmonary findings such as infiltrate or edema.  Slight interval enlargement of pulmonary nodules consistent with metastatic disease.  Left upper lobe nodule on image 11 measures 5 mm and is stable.  Left upper lobe pulmonary nodule on image number 23 measures 16.0 x 10.5 x 11.0 mm and previously measured 17.0 x 10.0 x 7.5 mm. This has a more solid than semi-solid appearance, as it did on the prior study (particularly on the sagittal images).  Right lower lobe pulmonary nodule on image 39 measures 6.5 mm and previously measured 5 mm.  Right lower lobe nodule at the right lung base on image number 46 measures 16.5 x 16.5 mm and previously measured 15 x 13 mm.  No new pulmonary nodules.  Upper abdomen: No significant findings.  IMPRESSION: 1. Slight interval enlargement of pulmonary nodules as described above. No new pulmonary lesions. 2. Stable small scattered mediastinal and hilar lymph nodes but no mass or overt adenopathy. 3. No worrisome bone lesions to suggest osseous metastatic disease. 4. Stable advanced atherosclerotic calcifications involving the aorta and coronary arteries.      Impression and Plan:   64 year old woman with the following issues:  1. Renal cell carcinoma diagnosed in July 2015. She presented with a 6.4 cm left kidney mass suspicious for malignancy. She is status post left nephrectomy done in September 2015 without any major complications. The pathology revealed a T3a lesion without any local lymphadenopathy. She does have bilateral pulmonary nodules that are suspicious for malignancy.    2. Bilateral pulmonary nodules: The differential diagnosis including other possibilities as well such as infectious or inflammatory etiologies. 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  CT scan from July 2016 was reviewed today and showed very small progression of these nodules.  Options of management at this time include biopsy of these nodules but I think the given the growth of these nodules that is very likely we are dealing with malignancy. Definitive treatment which include a surgical resection or ablative radiotherapy would be a possibility but I think her pulmonary status might be prohibitive.  Systemic therapy with VGEF tyrosine kinase inhibitors or high-dose IL-2 also an option but I think it would be reasonable to consider of these nodules continue to grow.  Other possibilities continue active surveillance and intervention with therapy upon symptomatic progression or development of any new pulmonary nodules. She is comfortable with this plan and plan on repeat imaging studies in November 2016. As there is rapid progression as of these nodules we will consider systemic therapy.    [facility], MD 8/17/20163:06 PM

## 2015-03-05 ENCOUNTER — Encounter: Payer: Self-pay | Admitting: Cardiovascular Disease

## 2015-03-05 ENCOUNTER — Ambulatory Visit (INDEPENDENT_AMBULATORY_CARE_PROVIDER_SITE_OTHER): Payer: Medicaid Other | Admitting: Cardiovascular Disease

## 2015-03-05 VITALS — BP 108/80 | HR 84 | Ht 66.5 in | Wt 192.6 lb

## 2015-03-05 DIAGNOSIS — I779 Disorder of arteries and arterioles, unspecified: Secondary | ICD-10-CM

## 2015-03-05 DIAGNOSIS — I2510 Atherosclerotic heart disease of native coronary artery without angina pectoris: Secondary | ICD-10-CM

## 2015-03-05 DIAGNOSIS — I480 Paroxysmal atrial fibrillation: Secondary | ICD-10-CM | POA: Diagnosis not present

## 2015-03-05 DIAGNOSIS — I739 Peripheral vascular disease, unspecified: Secondary | ICD-10-CM

## 2015-03-05 DIAGNOSIS — C642 Malignant neoplasm of left kidney, except renal pelvis: Secondary | ICD-10-CM

## 2015-03-05 NOTE — Patient Instructions (Signed)
Medication Instructions:  Your physician recommends that you continue on your current medications as directed. Please refer to the Current Medication list given to you today.   Labwork: none  Testing/Procedures: Your physician has requested that you have an ankle brachial index (ABI). During this test an ultrasound and blood pressure cuff are used to evaluate the arteries that supply the arms and legs with blood. Allow thirty minutes for this exam. There are no restrictions or special instructions. To be done in January 2017  Your physician has requested that you have a carotid duplex. This test is an ultrasound of the carotid arteries in your neck. It looks at blood flow through these arteries that supply the brain with blood. Allow one hour for this exam. There are no restrictions or special instructions. To be done in January 2017    Follow-Up: Your physician wants you to follow-up in: 6 months.  You will receive a reminder letter in the mail two months in advance. If you don't receive a letter, please call our office to schedule the follow-up appointment.

## 2015-03-05 NOTE — Progress Notes (Signed)
Chief Complaint  Patient presents with  . Follow-up      History of Present Illness: 64 yo WF with history of HTN, hyperlipidemia, CAD with prior NSTEMI, respiratory failure February 2011, PAD here today for cardiac follow up. Her PV issues are followed by Dr. Trula Slade with [facility]. She underwent cardiac cath in February 2011 in the setting of a NSTEMI showing severe stenosis of the Circumflex, now s/p DES to the Circumflex. Her EF was found to be 30%, presumed to be non-ischemic in etiology. Echo on 08/14/09 with EF of 40-45%. She had left femoral to above knee popliteal bypass on Nov 15, 2010. She did well and has been followed by Dr. Trula Slade for graft surveillance. Angiogram 12/16/11 with patent left femoral to popliteal artery bypass graft with 50-60% left common femoral artery stenosis. Conservative management pursued secondary to the small size of the vessel. Echo March 2013 with normal LV size and function and no significant valvular issues. Carotid artery dopplers May 2013 in [facility] with 40-59% RICA stenosis and less than 25% LICA stenosis. Stress myoview August 2015 without ischemia. Echo 01/28/14 with normal LV function, trivial MR, MAD, biatrial enlargment. Admitted to [facility] July 2015 with abdominal pain, flank pain and hematuria. Found to have left renal cell mass c/w renal cell carcinoma. She had been on Plavix chronically and that was initially held. She was also found to have scattered pulmonary nodules. Hematuria resolved. She was admitted 9/16-9/18 and underwent robotic assisted lap radical left nephrectomy. Post-operatively, she became tachycardic in the setting of baseline afib with rates into the 160's. She was initially given IV diltiazem but dropped her pressure. Both rate and BP were able to be stabilized. At her follow-up on 03/30/14 she was started on Eliquis. She was cardioverted on 05/08/14 to sinus. She was seen in our office 8/'8/16 by Estella Husk, PA-C and was back in atrial fib.  Metoprolol was increased. She is being followed in oncology by Dr. Osker Mason and in pulmonary by Dr. Chase Caller. Pulmonary nodules stable. Likely metastatic from her renal cell carcinoma.   She is here today for cardiac follow up. Breathing is at baseline. She reports no chest pain. Her legs feel great. She is not smoking. No LE edema.   Primary Care Physician: [facility] (Dr. Sunday Spillers)  Last Lipid Profile:Lipid Panel     Component Value Date/Time   CHOL 186 02/12/2015 1019   TRIG 119.0 02/12/2015 1019   HDL 53.30 02/12/2015 1019   CHOLHDL 3 02/12/2015 1019   VLDL 23.8 02/12/2015 1019   LDLCALC 109* 02/12/2015 1019    Past Medical History  Diagnosis Date  . COPD (chronic obstructive pulmonary disease)   . Tobacco abuse     hx of  . Coronary artery disease     a. 08/2009 NSTEMI s/p PCI/DES to LCX. Mod residual LAD dzs;  b. 02/2014 Nonischemic myoview.  . Hyperlipidemia   . Ischemic cardiomyopathy     a. Echo 08/14/09 with LVEF 40-45%;  b. 01/2014 Echo: EF 55-60%, basal-mid inflat and inf HK, triv MR/TR, mod dil LA/RA.  Marland Kitchen A-fib     a. Dx 01/2014 - rate controlled. b. Recurrence 03/2014 in setting of renal cell carcinoma surgery.  . 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 Acute pyelonephritis 01/28/2014  . Hypertension   . PVD (peripheral vascular disease)     a. 11/2010 L Fem to above knee Pop bypass;  b. 12/2011 Angio: patent L fem->pop graft, LCFZ 50-60%.  Marland Kitchen  Pneumonia     hx of  . Anxiety   . History of kidney stones   . Carotid disease, bilateral     a. 11/2011 U/S: RICA 54-62, LICA <70.  . Lung nodule     a. 1cm RLL nodule - followed by pulmonology.  . Shortness of breath dyspnea     Past Surgical History  Procedure Laterality Date  . Coronary stent placement  2011  . Femoral bypass Left May 2012  . Lower extremity angiogram  December 16, 2011  . Cardiac catheterization    . Tonsillectomy  64 years old  . Robot assisted laparoscopic nephrectomy Left  03/22/2014    Procedure: ROBOTIC ASSISTED LAPAROSCOPIC RADICAL NEPHRECTOMY LEFT;  Surgeon: Alexis Frock, MD;  Location: [facility];  Service: Urology;  Laterality: Left;  . Cardioversion N/A 05/08/2014    Procedure: CARDIOVERSION;  Surgeon: Thayer Headings, MD;  Location: [place];  Service: Cardiovascular;  Laterality: N/A;  . Lower extremity angiogram N/A 12/16/2011    Procedure: LOWER EXTREMITY ANGIOGRAM;  Surgeon: Serafina Mitchell, MD;  Location: [facility] CATH LAB;  Service: Cardiovascular;  Laterality: N/A;    Current Outpatient Prescriptions  Medication Sig Dispense Refill  . albuterol (PROVENTIL HFA;VENTOLIN HFA) 108 (90 BASE) MCG/ACT inhaler Inhale 2 puffs into the lungs every 6 (six) hours as needed for wheezing or shortness of breath (shortness of breath). 1 Inhaler 3  . apixaban (ELIQUIS) 5 MG TABS tablet Take 5 mg by mouth 2 (two) times daily.    . diphenhydramine-acetaminophen (TYLENOL PM) 25-500 MG TABS Take 2 tablets by mouth daily as needed (for sleep).     Marland Kitchen escitalopram (LEXAPRO) 10 MG tablet Take 1 tablet (10 mg total) by mouth at bedtime. 30 tablet 6  . furosemide (LASIX) 20 MG tablet Take 1 tablet (20 mg total) by mouth every morning. 30 tablet 6  . metoprolol (LOPRESSOR) 50 MG tablet Take 100 mg(2 tablets) by mouth in the am and 75 mg(1 1/2 tablets) by mouth in the pm 315 tablet 3  . mometasone-formoterol (DULERA) 200-5 MCG/ACT AERO Inhale 2 puffs into the lungs daily. Take 2 puffs first thing in am and then another 2 puffs about 12 hours later.    . potassium chloride (K-DUR) 10 MEQ tablet Take 10 mEq by mouth every morning. With the lasix    . simvastatin (ZOCOR) 20 MG tablet Take 1 tablet (20 mg total) by mouth every evening. 30 tablet 6  . tiotropium (SPIRIVA) 18 MCG inhalation capsule Place 1 capsule (18 mcg total) into inhaler and inhale every morning. 30 capsule 6   No current facility-administered medications for this visit.    Allergies  Allergen Reactions  . Ace  Inhibitors Cough  . Amoxicillin-Pot Clavulanate Hives  . Codeine Nausea And Vomiting    Social History   Social History  . Marital Status: Married    Spouse Name: N/A  . Number of Children: 3  . Years of Education: N/A   Occupational History  . sculptor    Social History Main Topics  . Smoking status: Former Smoker -- 1.50 packs/day for 30 years    Types: Cigarettes    Quit date: 08/12/2009  . Smokeless tobacco: Never Used     Comment: she has smoker 1-2 packs daily since the age of 45. 88 pack years  . Alcohol Use: No  . Drug Use: No  . Sexual Activity: Not on file   Other Topics Concern  . Not on file  Social History Narrative    Family History  Problem Relation Age of Onset  . Coronary artery disease Mother     deceased. coronary artery bypass graft  . Cancer Mother     BREAST  . Heart disease Mother     PVD  - Amputation-Leg  . Hyperlipidemia Mother   . Hypertension Mother   . Heart attack Mother   . Cirrhosis Father     deceased  . Cancer Father   . Other Brother     alive, unknown hx  . Stroke Maternal Aunt     Review of Systems:  As stated in the HPI and otherwise negative.   BP 108/80 mmHg  Pulse 84  Ht 5' 6.5" (1.689 m)  Wt 192 lb 9.6 oz (87.363 kg)  BMI 30.62 kg/m2  SpO2 97%  Physical Examination: General: Well developed, well nourished, NAD HEENT: OP clear, mucus membranes moist SKIN: warm, dry. No rashes. Neuro: No focal deficits Musculoskeletal: Muscle strength 5/5 all ext Psychiatric: Mood and affect normal Neck: No JVD, no carotid bruits, no thyromegaly, no lymphadenopathy. Lungs:Clear bilaterally, no wheezes, rhonci, crackles Cardiovascular: Regular rate and rhythm. No murmurs, gallops or rubs. Abdomen:Soft. Bowel sounds present. Non-tender.  Extremities: No lower extremity edema. Pulses are not palpable in the bilateral DP/PT.  Stress myoview 02/11/14: Rest Procedure: Myocardial perfusion imaging was performed at rest 45  minutes following the intravenous administration of Technetium 26m Sestamibi. Rest ECG: NSR - Normal EKG  Stress Procedure: The patient received IV Lexiscan 0.4 mg over 15-seconds. Technetium 66m Sestamibi injected at 30-seconds. The patient had sob, dizziness, and was hot with the Lexiscan injection. Quantitative spect images were obtained after a 45 minute delay. Stress ECG: No significant change from baseline ECG  QPS Raw Data Images: There is interference from nuclear activity from structures below the diaphragm. This does not affect the ability to read the study. Stress Images: There is a small severe area of attenuation of the inferior mid-base. The uptake in the other regions is nromal.  Rest Images: There is a small severe area of attenuation of the inferior mid-base. The uptake in the other regions is nromal. Subtraction (SDS): No evidence of ischemia. There is a fixed inferior basal-mid scar. Transient Ischemic Dilatation (Normal <1.22): 1.17 Lung/Heart Ratio (Normal <0.45): 0.33  Quantitative Gated Spect Images QGS EDV: 80 ml QGS ESV: 35 ml  Impression Exercise Capacity: Lexiscan with no exercise. BP Response: Normal blood pressure response. Clinical Symptoms: No significant symptoms noted. ECG Impression: No significant ST segment change suggestive of ischemia. Comparison with Prior Nuclear Study: No significant change from previous study from 11/11/12.   Overall Impression: Low risk stress nuclear study . There is evidence of a previouis mid-basal inferior MI. There is no evidence of ischemia. .  LV Ejection Fraction: 56%. LV Wall Motion: There is mid-basal inferior severe hypokinesis. The overall EF remains normal.   Echo 01/28/14: Left ventricle: The cavity size was normal. Wall thickness was normal. Systolic function was normal. The estimated ejection fraction was in the range of 55% to 60%. Probable hypokinesis of the basal-midinferolateral  and inferior myocardium. The study is not technically sufficient to allow evaluation of LV diastolic function. - Aortic valve: Mildly calcified annulus. Trileaflet; mildly calcified leaflets. There was no significant regurgitation. - Mitral valve: Calcified annulus. There was trivial regurgitation. - Left atrium: The atrium was moderately dilated. - Right atrium: The atrium was moderately dilated. Central venous pressure (est): 8 mm Hg. - Tricuspid valve: There  was trivial regurgitation. - Pulmonary arteries: Systolic pressure could not be accurately estimated. - Pericardium, extracardiac: There was no pericardial effusion. Impressions: - Normal LV chamber size and wall thickness with LVEF 55-60%, probable hypokinesis of the mid to basal inferolateral wall. Indeterminate diastolic function in the setting of atrial fibrillation. Moderate biatrial enlargement. MAC with trivial mitral regurgitation. Unable to assess PASP.  EKG:  EKG is not ordered today. The ekg ordered today demonstrates   Recent Labs: 03/24/2014: Magnesium 1.9 03/30/2014: TSH 1.00 10/24/2014: BUN 14; Creatinine, Ser 1.09; Hemoglobin 13.0; Platelets 300.0; Potassium 3.9; Sodium 137 02/12/2015: ALT 11   Lipid Panel    Component Value Date/Time   CHOL 186 02/12/2015 1019   TRIG 119.0 02/12/2015 1019   HDL 53.30 02/12/2015 1019   CHOLHDL 3 02/12/2015 1019   VLDL 23.8 02/12/2015 1019   LDLCALC 109* 02/12/2015 1019   LDLDIRECT 180.5 09/02/2011 1011     Wt Readings from Last 3 Encounters:  03/05/15 192 lb 9.6 oz (87.363 kg)  02/21/15 192 lb 8 oz (87.317 kg)  02/12/15 191 lb (86.637 kg)     Other studies Reviewed: Additional studies/ records that were reviewed today include: . Review of the above records demonstrates:   Assessment and Plan:   1. CAD: Stable. Will continue current medical therapy.   2. PAD/carotid artery disease: Carotid dopplers January 2016 with 40-59% RICA stenosis, 9-84% LICA stenosis. ABI  stable January 2016. No leg pain with ambulation. Will refer back to [facility] if any changes. She is asymptomatic. Repeat carotid and ABI January 2017.   3. Hyperlipidemia: Continue statin.   4. COPD/Metastatic cancer to lungs:  Followed in [facility]   5. Atrial fibrillation: Cardioversion November 2015 but seems to be in atrial fib this am. Rate controlled. Continue beta blocker and anti-coagulation.    6. Renal cell carcinoma: s/p resection, stable. Followed in Oncology.   Current medicines are reviewed at length with the patient today.  The patient does not have concerns regarding medicines.  The following changes have been made:  no change  Labs/ tests ordered today include:  No orders of the defined types were placed in this encounter.    Disposition:   FU with me in 6 months  Signed, Lauree Chandler, MD 03/05/2015 3:50 PM    [facility] [place], [place], [place]  [address] Phone: [phone]; Fax: [phone]

## 2015-03-16 ENCOUNTER — Other Ambulatory Visit: Payer: Self-pay | Admitting: Internal Medicine

## 2015-03-21 ENCOUNTER — Other Ambulatory Visit: Payer: Self-pay | Admitting: *Deleted

## 2015-03-21 MED ORDER — POTASSIUM CHLORIDE ER 10 MEQ PO TBCR: 10.0000 meq | EXTENDED_RELEASE_TABLET | Freq: Every day | ORAL | Status: DC

## 2015-03-22 ENCOUNTER — Other Ambulatory Visit: Payer: Self-pay

## 2015-03-22 MED ORDER — SIMVASTATIN 20 MG PO TABS: 20.0000 mg | ORAL_TABLET | Freq: Every evening | ORAL | Status: DC

## 2015-03-22 MED ORDER — APIXABAN 5 MG PO TABS: 5.0000 mg | ORAL_TABLET | Freq: Two times a day (BID) | ORAL | Status: DC

## 2015-03-22 NOTE — Telephone Encounter (Signed)
Allison Blanks, MD at 03/05/2015 6:35 AM  5. Atrial fibrillation: Cardioversion November 2015 but seems to be in atrial fib this am. Rate controlled. Continue beta blocker and anti-coagulation. 3. Hyperlipidemia: Continue statin Patient Instructions     Medication Instructions:  Your physician recommends that you continue on your current medications as directed. Please refer to the Current Medication list given to you today.    apixaban (ELIQUIS) 5 MG TABS tabletTake 5 mg by mouth 2 (two) times daily simvastatin (ZOCOR) 20 MG tabletTake 1 tablet (20 mg total) by mouth every evening

## 2015-05-15 ENCOUNTER — Other Ambulatory Visit: Payer: Self-pay

## 2015-05-15 MED ORDER — ESCITALOPRAM OXALATE 10 MG PO TABS: 10.0000 mg | ORAL_TABLET | Freq: Every day | ORAL | Status: DC

## 2015-05-15 MED ORDER — FUROSEMIDE 20 MG PO TABS: 20.0000 mg | ORAL_TABLET | Freq: Every morning | ORAL | Status: DC

## 2015-05-25 ENCOUNTER — Encounter: Payer: Self-pay | Admitting: Internal Medicine

## 2015-05-25 ENCOUNTER — Ambulatory Visit (INDEPENDENT_AMBULATORY_CARE_PROVIDER_SITE_OTHER): Payer: Medicaid Other | Admitting: Internal Medicine

## 2015-05-25 VITALS — BP 122/74 | HR 114 | Ht 66.5 in | Wt 197.0 lb

## 2015-05-25 DIAGNOSIS — J449 Chronic obstructive pulmonary disease, unspecified: Secondary | ICD-10-CM | POA: Diagnosis not present

## 2015-05-25 DIAGNOSIS — Z23 Encounter for immunization: Secondary | ICD-10-CM

## 2015-05-25 DIAGNOSIS — R0689 Other abnormalities of breathing: Secondary | ICD-10-CM

## 2015-05-25 DIAGNOSIS — R0600 Dyspnea, unspecified: Secondary | ICD-10-CM

## 2015-05-25 DIAGNOSIS — Z01811 Encounter for preprocedural respiratory examination: Secondary | ICD-10-CM | POA: Diagnosis not present

## 2015-05-25 DIAGNOSIS — R918 Other nonspecific abnormal finding of lung field: Secondary | ICD-10-CM

## 2015-05-25 NOTE — Progress Notes (Signed)
Subjective:     Patient ID: Allison [name], female   DOB: 30-Mar-1951, 64 y.o.   MRN: [id number]  HPI     #COPD  - gold stage 3 - post bd fev1 41% - May 2014 - MS phenotpe - medicaid refused rehab  #Smoking   reports that she quit smoking about 3 years ago. Her smoking use included Cigarettes. She has a 45 pack-year smoking history. She has never used smokeless tobacco.  #imgaging data - May 20-12: clear cxr. No CT data - Dec 2014: she is open for low dose CT but will save $  #Alpha 1   - MS  #Thrushs  - July 2014 on symbicort - dec 2014 on bre0   #Lung nodule 1cm RLL - 01/28/14 in setting of kidney cancer left side   #RENAL CELL CARCINOMA, 6.5 CM, EXTENDING INTO PROXIMAL RENAL VEIN. - LEFT SIDE, s/p nephrectomy 03/22/14 - MARGINS NOT INVOLVED. - ADRENAL GLAND FREE OF TUMO        OV 04/04/2014  Chief Complaint  Patient presents with  . Follow-up    Pt had left nephrectomy 2 weeks ago. Pt states she has intermitent DOE. Pt denies cough and CP/tightness. Pt states she is still wearing nocturnal O2.    OV   COPD: saw her last in august She is s.p nephretomy left side for renal cell CA 03/22/14. Post op course went well from pulmonary stand point. Some abd wound non healing being followed by Dr Tresa Moore. COPD istself is  Stable. COntinues with ? Nocturnal o2, daily scheduled spiriva and dulera. UPtodate with vaccines. Main issue is post op fatigue but she is improving from it  SMoking:  reports that she quit smoking about 4 years ago. Her smoking use included Cigarettes. She has a 45 pack-year smoking history. She has never used smokeless tobacco.   L#Lung nodule Right lower lobe 1cm on CT chest 01/28/14- presumed due to kidney cancer   - this was noted during renal cell CA workup. SHe needs fu CT now but wants to defer for a few weeks to help her get over fatigue   #NEw - exam shows scratch marks on forearms; due to Cat. She says she cleans with antiseptic. No evidence of  infetion   05/17/2014 ER follow up  Patient returns for a follow-up from a recent emergency room visit. She was seen on November 4 for COPD flare with bronchitis. Chest x-ray showed no pneumonia with positive changes of bronchitis. She was treated with IV Solu-Medrol, and discharged on a Z-Pak and a prednisone taper  Feels breathing is back to baseline except for some occasional hoarseness.  finished zpak and prednisone yesterday.  She denies any chest pain, orthopnea, PND or leg swelling Remains on Dulera and Spiriva   Underwent left nephrectomy(renal cell carcinoma)  03/2014 has done well post op.   Has suspicious bilateral lung nodules that are being followed by serial CT . Next CT is due in 07/2014 .    OV 01/30/2015  Chief Complaint  Patient presents with  . Follow-up    Pt here after CT chest (super D). Pt states her work of breathing has increased but thinks it's due to the weather. Pt denies cough and CP/tightness.    :  COPD: She's now regained insurance. She is now on [place]. COPD stable. Some baseline shortness of breath and cough. SMoking in remission x 5 years  Lung nodule (renal cancer hx +, left nephrectomy summer 2015 -  Dr Tresa Moore) - multiple in July 2015t and OCt 2015 CT chest. THese are now enlarged in juy 2016 reported as below. I personally visualized image. Radiologist is concerned for metastasis from renal cell cancer. She tells me that she has an appointment with the urologist in October 2016. She has not seen oncology and November 2016 after she lost her insurance. She has no follow-up appointment with them. Apparently November 2015 she was placed on expectant follow-up  OV 05/25/2015  Chief Complaint  Patient presents with  . Follow-up    Pt states she is needing surgery clearance for an umbilical hernia. Pt states her breathing has slightly worsened since last OV. Pt c/o intermittent cough with white mucus. Pt denies CP/tightness .   Follow-up Gold  stage III COPD uses home oxygen at night: She is on triple inhaler therapy Spiriva and Dulera. Since summer 2060 and she has gained weight but she is also reporting worsening shortness of breath and fatigue more than usual no associated chest pain. She has seen cardiology since she last saw me. Apparently a repeat cardiac cath is under consideration this is per review of the note and per the patient. She is due to see cardiology soon. Smoking is under remission.  Lung nodule in the history of renal cancer and left nephrectomy summer 2015: She has seen oncology oncology notes reviewed. Initial concern was that she had metastatic renal cancer in the lung but several options were discussed and she is on serial observation. Next CT chest this end of November 2016, and up.  New issue she has a umbilical hernia/ventral hernia. Dr. Rosendo Gros will operate on this early part of next year she is asking for preoperative pulmonary clearance. Walking desaturation test 185 feet 3 laps on room air: She only walk one lap and did not desaturate but her heart rate rose up to 145/m. And she got dyspneic and stopped. She is unsure of the surgical time but does state that this will be laparoscopic surgery   [facility] Postperative Pulmonary Risk Score Comment Score  Type of surgery - abd ao aneurysm (27), thoracic (21), neurosurgery / upper abdominal / vascular (21), neck (11) abd if open 21  Emergency Surgery - (11) no 0  ALbumin < 3 or poor nutritional state - (9) No - alb 08 Feb 2015 0  BUN > 30 -  (8) Bun 28 - a8ug 2016 0  Partial or completely dependent functional status - (7) funcitonal 0  COPD -  (6) Copd + 6  Age - 18 to 84 (4), > 70  (6) Age 58 6  TOTAL  33  Risk Stratifcation scores  - < 10, 11-19, 20-27, 28-40, >40  Moderate risk for open surgery     [facility] Postperative Pulmonary Risk Score Comment Score  Age - <50 (0), 50-80 (3), >80 (16)  3  Preoperative pulse ox - >96 (0), 91-95 (8), <90 (24) 97% on  05/25/2015  0  Respiratory infection in last month - Yes (17) no 0  Preoperative anemia - < 10gm% - Yes (11) 13gm% aug 2016 0  Surgical incision - Upper abdominal (15), Thoracic (24) If open  15  Duration of surgery - <2h (0), 2-3h (16), >3h (23) 2-3h estimated for open 16  Emergency Surgery - Yes (8) no 0  TOTAL  34  Risk Stratification - Low (<26), Intermediate (26-44), High (>45)  intermediate      Current outpatient prescriptions:  .  albuterol (PROVENTIL HFA;VENTOLIN HFA) 108 (  90 BASE) MCG/ACT inhaler, Inhale 2 puffs into the lungs every 6 (six) hours as needed for wheezing or shortness of breath (shortness of breath)., Disp: 1 Inhaler, Rfl: 3 .  apixaban (ELIQUIS) 5 MG TABS tablet, Take 1 tablet (5 mg total) by mouth 2 (two) times daily., Disp: 180 tablet, Rfl: 1 .  diphenhydramine-acetaminophen (TYLENOL PM) 25-500 MG TABS, Take 2 tablets by mouth daily as needed (for sleep). , Disp: , Rfl:  .  DULERA 200-5 MCG/ACT AERO, 2 PUFFS FIRST THING IN AM AND THEN ANOTHER 2 PUFFS ABOUT 12 HOURS LATER, Disp: 13 g, Rfl: 11 .  escitalopram (LEXAPRO) 10 MG tablet, Take 1 tablet (10 mg total) by mouth at bedtime., Disp: 90 tablet, Rfl: 2 .  furosemide (LASIX) 20 MG tablet, Take 1 tablet (20 mg total) by mouth every morning., Disp: 90 tablet, Rfl: 2 .  metoprolol (LOPRESSOR) 50 MG tablet, Take 100 mg(2 tablets) by mouth in the am and 75 mg(1 1/2 tablets) by mouth in the pm, Disp: 315 tablet, Rfl: 3 .  potassium chloride (K-DUR) 10 MEQ tablet, Take 1 tablet (10 mEq total) by mouth daily., Disp: 30 tablet, Rfl: 3 .  simvastatin (ZOCOR) 20 MG tablet, Take 1 tablet (20 mg total) by mouth every evening., Disp: 90 tablet, Rfl: 1 .  tiotropium (SPIRIVA) 18 MCG inhalation capsule, Place 1 capsule (18 mcg total) into inhaler and inhale every morning., Disp: 30 capsule, Rfl: 6   Review of Systems     Objective:   Physical Exam  Constitutional: She is oriented to person, place, and time. She appears  well-developed and well-nourished. No distress.  Appears to have gained weight  HENT:  Head: Normocephalic and atraumatic.  Right Ear: External ear normal.  Left Ear: External ear normal.  Mouth/Throat: Oropharynx is clear and moist. No oropharyngeal exudate.  Eyes: Conjunctivae and EOM are normal. Pupils are equal, round, and reactive to light. Right eye exhibits no discharge. Left eye exhibits no discharge. No scleral icterus.  Neck: Normal range of motion. Neck supple. No JVD present. No tracheal deviation present. No thyromegaly present.  Cardiovascular: Normal rate, regular rhythm, normal heart sounds and intact distal pulses.  Exam reveals no gallop and no friction rub.   No murmur heard. Pulmonary/Chest: Effort normal and breath sounds normal. No respiratory distress. She has no wheezes. She has no rales. She exhibits no tenderness.  Abdominal: Soft. Bowel sounds are normal. She exhibits mass. She exhibits no distension. There is no tenderness. There is no rebound and no guarding.    Periumbilical hernia large and present  Musculoskeletal: Normal range of motion. She exhibits no edema or tenderness.  Lymphadenopathy:    She has no cervical adenopathy.  Neurological: She is alert and oriented to person, place, and time. She has normal reflexes. No cranial nerve deficit. She exhibits normal muscle tone. Coordination normal.  Skin: Skin is warm and dry. No rash noted. She is not diaphoretic. No erythema. No pallor.  Psychiatric: She has a normal mood and affect. Her behavior is normal. Judgment and thought content normal.  Vitals reviewed.  Filed Vitals:   05/25/15 1527  BP: 122/74  Pulse: 114  Height: 5' 6.5" (1.689 m)  Weight: 197 lb (89.359 kg)  SpO2: 97%       Assessment:       ICD-9-CM ICD-10-CM   1. Preop respiratory exam V72.82 Z01.811   2. COPD, severe - MS phenotype 496 J44.9   3. Multiple lung nodules 793.19 R91.8  4. Dyspnea and respiratory abnormality 786.09  R06.00     R06.89     Preoperative pulmonary risk from glycol hernia surgery: If this is a laparoscopic surgery in time is limited to less than 2 hours her risk for pulmonary competition is low-moderate. On the other hand if she is open surgery and surgical time is more than 2 hours then the risk is at least moderate. I do not see any contra indications for the surgery as long as she is not in COPD exacerbation  COPD-MS phenotype: She has worsening dyspnea with a COPD itself appears stable without exacerbation. Dyspnea is worse than summer 2016. She got very tachycardic. Therefore she needs to see cardiology. We will also get Pulmicort function test to make sure that her COPD itself has not deteriorated  Multiple lung nodules she is under watch by oncology. She has a scheduled CT chest #32,016 which she will keep up     Plan:     #Preoperative respiratory exam - Moderate risk for pulmonary complications following umbilical hernia surgery  - High risk is for open surgery and if surgical time is more than 2 hours - Risk will be low lower if surgery time is less than 2 hours and he will have a laparoscopic surgery  #COPD - Stable disease - Continue Spiriva and Dulera as before - Use albuterol as needed - Flu shot today 05/25/2015  #Worsening shortness of breath - This might not be COPD related but could be heart related due to rapid heart rate with walking - I will send my note to Dr. Julianne Handler - please follow up with him soon as possible - Will need full pulmonary function test to see if lung function is any worse -   #Lung nodules  - According to the [facility] - follow-up CT chest please keep up 06/06/2015  #Follow-up - My office will make sure you have a cardiology appointment as soon as possible  - Do full pulmonary function test as soon as possible - Return to see my nurse practitioner Patricia Nettle to review the pulmonary functio  Dr. Brand Males, M.D.,  [facility].C.P Pulmonary and Critical Care Medicine Staff Physician [place] [facility] Pager: [phone], If no answer or between  15:00h - 7:00h: call [phone]  05/25/2015 4:14 PM

## 2015-05-25 NOTE — Patient Instructions (Addendum)
ICD-9-CM ICD-10-CM   1. Preop respiratory exam V72.82 Z01.811   2. COPD, severe - MS phenotype 496 J44.9   3. Multiple lung nodules 793.19 R91.8   4. Dyspnea and respiratory abnormality 786.09 R06.00     R06.89    #Preoperative respiratory exam - Moderate risk for pulmonary complications following umbilical hernia surgery  - High risk is for open surgery and if surgical time is more than 2 hours - Risk will be low lower if surgery time is less than 2 hours and he will have a laparoscopic surgery  #COPD - Stable disease - Continue Spiriva and Dulera as before - Use albuterol as needed - Flu shot today 05/25/2015  #Worsening shortness of breath - This might not be COPD related but could be heart related due to rapid heart rate with walking - I will send my note to Dr. Julianne Handler - please follow up with him soon as possible - Will need full pulmonary function test to see if lung function is any worse -   #Lung nodules  - According to the [place] - follow-up CT chest please keep up 06/06/2015  #Follow-up - My office will make sure you have a cardiology appointment as soon as possible  - Do full pulmonary function test as soon as possible - Return to see my nurse practitioner Patricia Nettle to review the pulmonary function test results

## 2015-06-06 ENCOUNTER — Ambulatory Visit (HOSPITAL_COMMUNITY)
Admission: RE | Admit: 2015-06-06 | Discharge: 2015-06-06 | Disposition: A | Discharge status: Home / Self Care | Payer: Medicaid Other | Source: Ambulatory Visit | Attending: Oncology | Admitting: Oncology

## 2015-06-06 ENCOUNTER — Ambulatory Visit (HOSPITAL_COMMUNITY): Admission: RE | Admit: 2015-06-06 | Payer: Medicaid Other | Source: Ambulatory Visit

## 2015-06-06 ENCOUNTER — Other Ambulatory Visit (HOSPITAL_BASED_OUTPATIENT_CLINIC_OR_DEPARTMENT_OTHER): Payer: Medicaid Other

## 2015-06-06 ENCOUNTER — Encounter (HOSPITAL_COMMUNITY): Payer: Self-pay

## 2015-06-06 DIAGNOSIS — I700 Atherosclerosis of aorta: Secondary | ICD-10-CM | POA: Insufficient documentation

## 2015-06-06 DIAGNOSIS — Z08 Encounter for follow-up examination after completed treatment for malignant neoplasm: Secondary | ICD-10-CM | POA: Diagnosis not present

## 2015-06-06 DIAGNOSIS — Z85528 Personal history of other malignant neoplasm of kidney: Secondary | ICD-10-CM | POA: Diagnosis not present

## 2015-06-06 DIAGNOSIS — C7800 Secondary malignant neoplasm of unspecified lung: Principal | ICD-10-CM

## 2015-06-06 DIAGNOSIS — Z905 Acquired absence of kidney: Secondary | ICD-10-CM | POA: Insufficient documentation

## 2015-06-06 DIAGNOSIS — C649 Malignant neoplasm of unspecified kidney, except renal pelvis: Secondary | ICD-10-CM

## 2015-06-06 DIAGNOSIS — C642 Malignant neoplasm of left kidney, except renal pelvis: Secondary | ICD-10-CM | POA: Diagnosis present

## 2015-06-06 LAB — CBC WITH DIFFERENTIAL/PLATELET
BASO%: 0.3 % (ref 0.0–2.0)
Basophils Absolute: 0 10*3/uL (ref 0.0–0.1)
EOS ABS: 0.1 10*3/uL (ref 0.0–0.5)
EOS%: 1.2 % (ref 0.0–7.0)
HCT: 40.3 % (ref 34.8–46.6)
HEMOGLOBIN: 13.2 g/dL (ref 11.6–15.9)
LYMPH%: 16.9 % (ref 14.0–49.7)
MCH: 29.8 pg (ref 25.1–34.0)
MCHC: 32.8 g/dL (ref 31.5–36.0)
MCV: 91 fL (ref 79.5–101.0)
MONO#: 0.8 10*3/uL (ref 0.1–0.9)
MONO%: 7.5 % (ref 0.0–14.0)
NEUT%: 74.1 % (ref 38.4–76.8)
NEUTROS ABS: 8.1 10*3/uL — AB (ref 1.5–6.5)
Platelets: 272 10*3/uL (ref 145–400)
RBC: 4.43 10*6/uL (ref 3.70–5.45)
RDW: 14.5 % (ref 11.2–14.5)
WBC: 10.9 10*3/uL — AB (ref 3.9–10.3)
lymph#: 1.8 10*3/uL (ref 0.9–3.3)

## 2015-06-06 LAB — COMPREHENSIVE METABOLIC PANEL (CC13)
ALBUMIN: 3.3 g/dL — AB (ref 3.5–5.0)
ALK PHOS: 111 U/L (ref 40–150)
ALT: 9 U/L (ref 0–55)
AST: 13 U/L (ref 5–34)
Anion Gap: 7 mEq/L (ref 3–11)
BILIRUBIN TOTAL: 0.37 mg/dL (ref 0.20–1.20)
BUN: 15.3 mg/dL (ref 7.0–26.0)
CO2: 28 meq/L (ref 22–29)
CREATININE: 1.1 mg/dL (ref 0.6–1.1)
Calcium: 10 mg/dL (ref 8.4–10.4)
Chloride: 109 mEq/L (ref 98–109)
EGFR: 51 mL/min/{1.73_m2} — ABNORMAL LOW (ref >90)
GLUCOSE: 104 mg/dL (ref 70–140)
Potassium: 4.3 mEq/L (ref 3.5–5.1)
SODIUM: 144 meq/L (ref 136–145)
TOTAL PROTEIN: 7.5 g/dL (ref 6.4–8.3)

## 2015-06-06 MED ORDER — IOHEXOL 300 MG/ML  SOLN
75.0000 mL | Freq: Once | INTRAMUSCULAR | Status: AC | PRN
  Administered 2015-06-06: 75 mL via INTRAVENOUS

## 2015-06-07 NOTE — Progress Notes (Signed)
Chief Complaint  Patient presents with  . Follow-up    pt c/o sob and dizziness after sitting and standing /surgical clearance for hernia    History of Present Illness: 64 yo WF with history of HTN, hyperlipidemia, CAD, PAD, COPD, atrial fibrillation, metastatic renal cell carcinoma here today for cardiac follow up. She underwent cardiac cath in February 2011 in the setting of a NSTEMI showing severe stenosis of the Circumflex and a drug eluting stent was placed. LV dysfunction noted following her MI with LVEF most recently normal by echo July 2015. She had left femoral to above knee popliteal bypass on Nov 15, 2010. She did well and had been followed by Dr. Trula Slade for graft surveillance but she has not been seen there in several years.  Carotid artery dopplers May 2013 in [facility] with 40-59% RICA stenosis and less than [id number] LICA stenosis. Stress myoview August 2015 without ischemia. Echo 01/28/14 with normal LV function, trivial MR, MAD, biatrial enlargment. Admitted to [facility] July 2015 with abdominal pain, flank pain and hematuria. Found to have left renal cell mass c/w renal cell carcinoma. She had been on Plavix chronically and that was initially held. She was also found to have scattered pulmonary nodules. Hematuria resolved. She was admitted 9/16-9/18/15 and underwent robotic assisted lap radical left nephrectomy. Post-operatively, she became tachycardic in the setting of baseline afib with rates into the 160's. She was initially given IV diltiazem but dropped her pressure. Both rate and BP were able to be stabilized. At her follow-up on 03/30/14 she was started on Eliquis. She was cardioverted on 05/08/14 to sinus. She was seen in our office 8/'8/16 by Estella Husk, PA-C and was back in atrial fib. Metoprolol was increased. She is being followed in oncology by Dr. Osker Mason and in pulmonary by Dr. Chase Caller. Pulmonary nodules stable. Likely metastatic from her renal cell carcinoma.   She is here today for  cardiac follow up.  She reports no chest pain but her breathing has worsened. She has been having episodes of dizziness with standing. No near syncope or syncope. She did not have chest pain before her MI. She has no pain in her legs. No LE edema.   Primary Care Physician: [facility] (Dr. Sunday Spillers)  Past Medical History  Diagnosis Date  . COPD (chronic obstructive pulmonary disease) ([place])   . Tobacco abuse     hx of  . Coronary artery disease     a. 08/2009 NSTEMI s/p PCI/DES to LCX. Mod residual LAD dzs;  b. 02/2014 Nonischemic myoview.  . Hyperlipidemia   . Ischemic cardiomyopathy     a. Echo 08/14/09 with LVEF 40-45%;  b. 01/2014 Echo: EF 55-60%, basal-mid inflat and inf HK, triv MR/TR, mod dil LA/RA.  Marland Kitchen A-fib ([place])     a. Dx 01/2014 - rate controlled. b. Recurrence 03/2014 in setting of renal cell carcinoma surgery.  . Metastatic renal cell carcinoma to lung ([place]) 01/29/2014  . Acute pyelonephritis 01/28/2014  . Hypertension   . PVD (peripheral vascular disease) ([place])     a. 11/2010 L Fem to above knee Pop bypass;  b. 12/2011 Angio: patent L fem->pop graft, LCFZ 50-60%.  . Pneumonia     hx of  . Anxiety   . History of kidney stones   . Carotid disease, bilateral ([place])     a. 11/2011 U/S: RICA 123XX123, LICA [id number].  . Lung nodule     a. 1cm RLL nodule - followed by pulmonology.  . Shortness  of breath dyspnea     Past Surgical History  Procedure Laterality Date  . Coronary stent placement  2011  . Femoral bypass Left May 2012  . Lower extremity angiogram  December 16, 2011  . Cardiac catheterization    . Tonsillectomy  64 years old  . Robot assisted laparoscopic nephrectomy Left 03/22/2014    Procedure: ROBOTIC ASSISTED LAPAROSCOPIC RADICAL NEPHRECTOMY LEFT;  Surgeon: Alexis Frock, MD;  Location: [facility];  Service: Urology;  Laterality: Left;  . Cardioversion N/A 05/08/2014    Procedure: CARDIOVERSION;  Surgeon: Thayer Headings, MD;  Location: [place];  Service: Cardiovascular;   Laterality: N/A;  . Lower extremity angiogram N/A 12/16/2011    Procedure: LOWER EXTREMITY ANGIOGRAM;  Surgeon: Serafina Mitchell, MD;  Location: [facility] CATH LAB;  Service: Cardiovascular;  Laterality: N/A;    Current Outpatient Prescriptions  Medication Sig Dispense Refill  . albuterol (PROVENTIL HFA;VENTOLIN HFA) 108 (90 BASE) MCG/ACT inhaler Inhale 2 puffs into the lungs every 6 (six) hours as needed for wheezing or shortness of breath (shortness of breath). 1 Inhaler 3  . apixaban (ELIQUIS) 5 MG TABS tablet Take 1 tablet (5 mg total) by mouth 2 (two) times daily. 180 tablet 1  . diphenhydramine-acetaminophen (TYLENOL PM) 25-500 MG TABS Take 2 tablets by mouth daily as needed (for sleep).     . DULERA 200-5 MCG/ACT AERO 2 PUFFS FIRST THING IN AM AND THEN ANOTHER 2 PUFFS ABOUT 12 HOURS LATER 13 g 11  . escitalopram (LEXAPRO) 10 MG tablet Take 1 tablet (10 mg total) by mouth at bedtime. 90 tablet 2  . furosemide (LASIX) 20 MG tablet Take 1 tablet (20 mg total) by mouth every morning. 90 tablet 2  . metoprolol (LOPRESSOR) 50 MG tablet Take 100 mg(2 tablets) by mouth in the am and 75 mg(1 1/2 tablets) by mouth in the pm 315 tablet 3  . potassium chloride (K-DUR) 10 MEQ tablet Take 1 tablet (10 mEq total) by mouth daily. 30 tablet 3  . simvastatin (ZOCOR) 20 MG tablet Take 1 tablet (20 mg total) by mouth every evening. 90 tablet 1  . tiotropium (SPIRIVA) 18 MCG inhalation capsule Place 1 capsule (18 mcg total) into inhaler and inhale every morning. 30 capsule 6   No current facility-administered medications for this visit.    Allergies  Allergen Reactions  . Ace Inhibitors Cough  . Amoxicillin-Pot Clavulanate Hives  . Codeine Nausea And Vomiting    Social History   Social History  . Marital Status: Married    Spouse Name: N/A  . Number of Children: 3  . Years of Education: N/A   Occupational History  . sculptor    Social History Main Topics  . Smoking status: Former Smoker -- 1.50  packs/day for 30 years    Types: Cigarettes    Quit date: 08/12/2009  . Smokeless tobacco: Never Used     Comment: she has smoker 1-2 packs daily since the age of 64. 88 pack years  . Alcohol Use: No  . Drug Use: No  . Sexual Activity: Not on file   Other Topics Concern  . Not on file   Social History Narrative    Family History  Problem Relation Age of Onset  . Coronary artery disease Mother     deceased. coronary artery bypass graft  . Cancer Mother     BREAST  . Heart disease Mother     PVD  - Amputation-Leg  . Hyperlipidemia Mother   .  Hypertension Mother   . Heart attack Mother   . Cirrhosis Father     deceased  . Cancer Father   . Other Brother     alive, unknown hx  . Stroke Maternal Aunt     Review of Systems:  As stated in the HPI and otherwise negative.   BP 140/90 mmHg  Pulse 94  Ht 5\' 7"  (1.702 m)  Wt 198 lb 6.4 oz (89.994 kg)  BMI 31.07 kg/m2  SpO2 96%  Physical Examination: General: Well developed, well nourished, NAD HEENT: OP clear, mucus membranes moist SKIN: warm, dry. No rashes. Neuro: No focal deficits Musculoskeletal: Muscle strength 5/5 all ext Psychiatric: Mood and affect normal Neck: No JVD, no carotid bruits, no thyromegaly, no lymphadenopathy. Lungs:Clear bilaterally, no wheezes, rhonci, crackles Cardiovascular: Regular rate and rhythm. No murmurs, gallops or rubs. Abdomen:Soft. Bowel sounds present. Non-tender.  Extremities: No lower extremity edema. Pulses are not palpable in the bilateral DP/PT.  Stress myoview 02/11/14: Rest Procedure: Myocardial perfusion imaging was performed at rest 45 minutes following the intravenous administration of Technetium 18m Sestamibi. Rest ECG: NSR - Normal EKG  Stress Procedure: The patient received IV Lexiscan 0.4 mg over 15-seconds. Technetium 50m Sestamibi injected at 30-seconds. The patient had sob, dizziness, and was hot with the Lexiscan injection. Quantitative spect images were obtained  after a 45 minute delay. Stress ECG: No significant change from baseline ECG  QPS Raw Data Images: There is interference from nuclear activity from structures below the diaphragm. This does not affect the ability to read the study. Stress Images: There is a small severe area of attenuation of the inferior mid-base. The uptake in the other regions is nromal.  Rest Images: There is a small severe area of attenuation of the inferior mid-base. The uptake in the other regions is nromal. Subtraction (SDS): No evidence of ischemia. There is a fixed inferior basal-mid scar. Transient Ischemic Dilatation (Normal <1.22): 1.17 Lung/Heart Ratio (Normal <0.45): 0.33  Quantitative Gated Spect Images QGS EDV: 80 ml QGS ESV: 35 ml  Impression Exercise Capacity: Lexiscan with no exercise. BP Response: Normal blood pressure response. Clinical Symptoms: No significant symptoms noted. ECG Impression: No significant ST segment change suggestive of ischemia. Comparison with Prior Nuclear Study: No significant change from previous study from 11/11/12.   Overall Impression: Low risk stress nuclear study . There is evidence of a previouis mid-basal inferior MI. There is no evidence of ischemia. .  LV Ejection Fraction: 56%. LV Wall Motion: There is mid-basal inferior severe hypokinesis. The overall EF remains normal.   Echo 01/28/14: Left ventricle: The cavity size was normal. Wall thickness was normal. Systolic function was normal. The estimated ejection fraction was in the range of 55% to 60%. Probable hypokinesis of the basal-midinferolateral and inferior myocardium. The study is not technically sufficient to allow evaluation of LV diastolic function. - Aortic valve: Mildly calcified annulus. Trileaflet; mildly calcified leaflets. There was no significant regurgitation. - Mitral valve: Calcified annulus. There was trivial regurgitation. - Left atrium: The atrium was moderately  dilated. - Right atrium: The atrium was moderately dilated. Central venous pressure (est): 8 mm Hg. - Tricuspid valve: There was trivial regurgitation. - Pulmonary arteries: Systolic pressure could not be accurately estimated. - Pericardium, extracardiac: There was no pericardial effusion. Impressions: - Normal LV chamber size and wall thickness with LVEF 55-60%, probable hypokinesis of the mid to basal inferolateral wall. Indeterminate diastolic function in the setting of atrial fibrillation. Moderate biatrial enlargement. MAC with trivial mitral  regurgitation. Unable to assess PASP.  EKG:  EKG is ordered today. The ekg ordered today demonstrates Atrial fibrillation, rate 94 bpm.   Recent Labs: 06/06/2015: ALT 9; BUN 15.3; Creatinine 1.1; HGB 13.2; Platelets 272; Potassium 4.3; Sodium 144   Lipid Panel    Component Value Date/Time   CHOL 186 02/12/2015 1019   TRIG 119.0 02/12/2015 1019   HDL 53.30 02/12/2015 1019   CHOLHDL 3 02/12/2015 1019   VLDL 23.8 02/12/2015 1019   LDLCALC 109* 02/12/2015 1019   LDLDIRECT 180.5 09/02/2011 1011     Wt Readings from Last 3 Encounters:  06/08/15 198 lb 6.4 oz (89.994 kg)  06/08/15 198 lb 14.4 oz (90.22 kg)  05/25/15 197 lb (89.359 kg)     Other studies Reviewed: Additional studies/ records that were reviewed today include: . Review of the above records demonstrates:   Assessment and Plan:   1. CAD with unstable angina: She is not having chest pain but she is having dyspnea. This seems to be more prominent over the last 3 months. She did not have chest pain before her MI in 2011. She is on good medical therapy. Will arrange Lexiscan stress myoview to exclude ischemia before her planned surgical procedure.  Will continue current medical therapy.   2. PAD/carotid artery disease: Carotid dopplers January 2016 with 40-59% RICA stenosis, [id number] LICA stenosis. ABI stable January 2016. No leg pain with ambulation. She is asymptomatic. Repeat  carotid and ABI January 2017.   3. Hyperlipidemia: Continue statin.   4. COPD/Metastatic cancer to lungs:  Followed in [facility]   5. Atrial fibrillation, persistent: Rate controlled. Continue beta blocker and anti-coagulation.    6. Renal cell carcinoma: s/p resection, stable. Followed in Oncology.   7. Pre-operative cardiovascular examination: Will arrange Lexiscan stress myoview and then make further recommendations on her cardiac risk with surgery.   Current medicines are reviewed at length with the patient today.  The patient does not have concerns regarding medicines.  The following changes have been made:  no change  Labs/ tests ordered today include:  No orders of the defined types were placed in this encounter.    Disposition:   FU with me in 6 months  Signed, Lauree Chandler, MD 06/08/2015 11:35 AM    [facility] [address], [place], [place]  [address] Phone: [phone]; Fax: [phone]

## 2015-06-08 ENCOUNTER — Encounter: Payer: Self-pay | Admitting: Cardiovascular Disease

## 2015-06-08 ENCOUNTER — Ambulatory Visit (HOSPITAL_BASED_OUTPATIENT_CLINIC_OR_DEPARTMENT_OTHER): Payer: Medicaid Other | Admitting: Oncology

## 2015-06-08 ENCOUNTER — Telehealth: Payer: Self-pay | Admitting: Oncology

## 2015-06-08 ENCOUNTER — Ambulatory Visit (INDEPENDENT_AMBULATORY_CARE_PROVIDER_SITE_OTHER): Payer: Medicaid Other | Admitting: Cardiovascular Disease

## 2015-06-08 VITALS — BP 140/90 | HR 94 | Ht 67.0 in | Wt 198.4 lb

## 2015-06-08 VITALS — BP 165/74 | HR 97 | Temp 98.1°F | Resp 20 | Ht 66.5 in | Wt 198.9 lb

## 2015-06-08 DIAGNOSIS — R918 Other nonspecific abnormal finding of lung field: Secondary | ICD-10-CM

## 2015-06-08 DIAGNOSIS — I481 Persistent atrial fibrillation: Secondary | ICD-10-CM | POA: Diagnosis not present

## 2015-06-08 DIAGNOSIS — E785 Hyperlipidemia, unspecified: Secondary | ICD-10-CM

## 2015-06-08 DIAGNOSIS — C7800 Secondary malignant neoplasm of unspecified lung: Secondary | ICD-10-CM | POA: Diagnosis not present

## 2015-06-08 DIAGNOSIS — Z01810 Encounter for preprocedural cardiovascular examination: Secondary | ICD-10-CM

## 2015-06-08 DIAGNOSIS — I739 Peripheral vascular disease, unspecified: Secondary | ICD-10-CM

## 2015-06-08 DIAGNOSIS — I779 Disorder of arteries and arterioles, unspecified: Secondary | ICD-10-CM

## 2015-06-08 DIAGNOSIS — I2510 Atherosclerotic heart disease of native coronary artery without angina pectoris: Secondary | ICD-10-CM

## 2015-06-08 DIAGNOSIS — C649 Malignant neoplasm of unspecified kidney, except renal pelvis: Secondary | ICD-10-CM

## 2015-06-08 DIAGNOSIS — I4819 Other persistent atrial fibrillation: Secondary | ICD-10-CM

## 2015-06-08 DIAGNOSIS — C642 Malignant neoplasm of left kidney, except renal pelvis: Secondary | ICD-10-CM

## 2015-06-08 NOTE — Patient Instructions (Signed)
Medication Instructions:  Your physician recommends that you continue on your current medications as directed. Please refer to the Current Medication list given to you today.   Labwork: none  Testing/Procedures: Your physician has requested that you have a lexiscan myoview. For further information please visit [email or web address]. Please follow instruction sheet, as given.    Follow-Up: Your physician wants you to follow-up in: 6 months .  You will receive a reminder letter in the mail two months in advance. If you don't receive a letter, please call our office to schedule the follow-up appointment.   Any Other Special Instructions Will Be Listed Below (If Applicable).     If you need a refill on your cardiac medications before your next appointment, please call your pharmacy.   

## 2015-06-08 NOTE — Progress Notes (Signed)
Hematology and Oncology Follow Up Visit  Allison [name] [id number] 1951/03/15 64 y.o. 06/08/2015 10:06 AM [place] ref. provider found   Principle Diagnosis: 64 year old woman with renal cell carcinoma diagnosed in July 2015. She presented with a 6.4 cm left kidney mass and pulmonary nodules. She has likely stage IV disease.   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   Current therapy: Observation surveillance.  Interim History: Ms. Allison [name] presents today for a follow-up visit. Since the last visit, she reports no changes in her health. She continues to have dyspnea on exertion which is chronic in nature. She does use nighttime oxygen but no daytime use. She is able to ambulate without any difficulties but have to rest at times. Has not reported any increase in her cough or hemoptysis. Has not reported any weight loss or constitutional symptoms. She continued to have reasonable performance status at this time.   She does not report any headaches blurred vision or double vision or syncope. She does not report any fevers, chills, sweats or weight loss.. She does not report any chest pain, palpitation, orthopnea, PND or leg edema. She does not report any nausea, vomiting, obstipation, diarrhea, hematochezia or melena. She does not report any hematuria, dysuria or frequency. She does not report any skeletal complaints such as arthralgias or myalgias or joint swelling. She does not report any lymphadenopathy or petechiae.  Rest of her review of systems unremarkable.  Medications: I have reviewed the patient's current medications.  Current Outpatient Prescriptions  Medication Sig Dispense Refill  . albuterol (PROVENTIL HFA;VENTOLIN HFA) 108 (90 BASE) MCG/ACT inhaler Inhale 2 puffs into the lungs every 6 (six) hours as needed for wheezing or shortness of  breath (shortness of breath). 1 Inhaler 3  . apixaban (ELIQUIS) 5 MG TABS tablet Take 1 tablet (5 mg total) by mouth 2 (two) times daily. 180 tablet 1  . diphenhydramine-acetaminophen (TYLENOL PM) 25-500 MG TABS Take 2 tablets by mouth daily as needed (for sleep).     . DULERA 200-5 MCG/ACT AERO 2 PUFFS FIRST THING IN AM AND THEN ANOTHER 2 PUFFS ABOUT 12 HOURS LATER 13 g 11  . escitalopram (LEXAPRO) 10 MG tablet Take 1 tablet (10 mg total) by mouth at bedtime. 90 tablet 2  . furosemide (LASIX) 20 MG tablet Take 1 tablet (20 mg total) by mouth every morning. 90 tablet 2  . metoprolol (LOPRESSOR) 50 MG tablet Take 100 mg(2 tablets) by mouth in the am and 75 mg(1 1/2 tablets) by mouth in the pm 315 tablet 3  . potassium chloride (K-DUR) 10 MEQ tablet Take 1 tablet (10 mEq total) by mouth daily. 30 tablet 3  . simvastatin (ZOCOR) 20 MG tablet Take 1 tablet (20 mg total) by mouth every evening. 90 tablet 1  . tiotropium (SPIRIVA) 18 MCG inhalation capsule Place 1 capsule (18 mcg total) into inhaler and inhale every morning. 30 capsule 6   No current facility-administered medications for this visit.     Allergies:  Allergies  Allergen Reactions  . Ace Inhibitors Cough  . Amoxicillin-Pot Clavulanate Hives  . Codeine Nausea And Vomiting    Past Medical History, Surgical history, Social history, and Family History were reviewed and updated.   Physical Exam: Blood pressure 165/74, pulse 97, temperature 98.1 F (36.7 C), temperature source Oral, resp. rate 20, height 5' 6.5" (1.689 m), weight 198  lb 14.4 oz (90.22 kg), SpO2 99 %. ECOG: 1 General appearance: alert and cooperative not in any distress. Head: Normocephalic, without obvious abnormality sclerae anicteric. Neck: no adenopathy Lymph nodes: Cervical, supraclavicular, and axillary nodes normal. Heart:regular rate and rhythm, S1, S2 normal, no murmur, click, rub or gallop Lung: Expiratory wheezing noted bilaterally. Good breath sounds  otherwise. No shifting dullness. Abdomin: soft, non-tender, without masses or organomegaly EXT:no erythema, induration, or nodules   Lab Results: Lab Results  Component Value Date   WBC 10.9* 06/06/2015   HGB 13.2 06/06/2015   HCT 40.3 06/06/2015   MCV 91.0 06/06/2015   PLT 272 06/06/2015     Chemistry      Component Value Date/Time   NA 144 06/06/2015 1037   NA 137 10/24/2014 0926   K 4.3 06/06/2015 1037   K 3.9 10/24/2014 0926   CL 104 10/24/2014 0926   CO2 28 06/06/2015 1037   CO2 28 10/24/2014 0926   BUN 15.3 06/06/2015 1037   BUN 14 10/24/2014 0926   CREATININE 1.1 06/06/2015 1037   CREATININE 1.09 10/24/2014 0926      Component Value Date/Time   CALCIUM 10.0 06/06/2015 1037   CALCIUM 9.6 10/24/2014 0926   ALKPHOS 111 06/06/2015 1037   ALKPHOS 118* 02/12/2015 1019   AST 13 06/06/2015 1037   AST 14 02/12/2015 1019   ALT 9 06/06/2015 1037   ALT 11 02/12/2015 1019   BILITOT 0.37 06/06/2015 1037   BILITOT 0.3 02/12/2015 1019       EXAM: CT CHEST WITH CONTRAST  TECHNIQUE: Multidetector CT imaging of the chest was performed during intravenous contrast administration.  CONTRAST: 60mL OMNIPAQUE IOHEXOL 300 MG/ML SOLN  COMPARISON: CT scan 01/10/2015  FINDINGS: Mediastinum/Nodes: No breast masses, supraclavicular or axillary lymphadenopathy. The thyroid gland appears normal.  The heart is normal in size. No pericardial effusion. Stable advanced atherosclerotic calcifications involving the aorta and branch vessels including the coronary arteries. Stable small scattered mediastinal and hilar lymph nodes but no mass or adenopathy. The esophagus is grossly normal.  Lungs/Pleura: Stable advanced emphysematous changes.  Overall stable pulmonary nodules.  Left upper lobe nodule on image 13 measures 3.5 mm and previously measured 4.5 mm.  Left upper lobe lesion on image 25 measures 15 x 11 mm and previously measured 16 x 11 mm.  Right lower  lobe nodule on image 45 measures 17 x 17.5 mm and previously measured 16.5 x 16.5 mm.  6 mm right lower lobe pulmonary nodule on image 41 is stable.  No new pulmonary lesions.  No acute pulmonary findings. Stable lingular scarring changes.  Upper abdomen: No significant upper abdominal findings. A small gallstone is noted in the gallbladder. There is a the anterior abdominal wall hernia containing fat. Advanced atherosclerotic calcifications involving the aorta and branch vessels.  Musculoskeletal: No significant bony findings.  IMPRESSION: 1. Overall stable pulmonary metastatic disease. No new lesions. 2. No mediastinal or hilar mass or adenopathy. 3. Stable advanced atherosclerotic calcifications involving the aorta and branch vessels.    Impression and Plan:   64 year old woman with the following issues:  1. Renal cell carcinoma diagnosed in July 2015. She presented with a 6.4 cm left kidney mass suspicious for malignancy. She is status post left nephrectomy done in September 2015 without any major complications. The pathology revealed a T3a lesion without any local lymphadenopathy. She does have bilateral pulmonary nodules that are suspicious for malignancy.   2. Bilateral pulmonary nodules: The differential diagnosis including other possibilities  as well such as infectious or inflammatory etiologies. This is most likely represents metastatic renal cell carcinoma and to the lung.  CT scan from 06/06/2015 was reviewed today and showed no major changes of these pulmonary nodules.  Options of management at this time were reviewed with the patient. Systemic therapy, biopsy, surgical resection or observation were all discussed today. And for the time being we have elected to continue with observation surveillance. Given her poor pulmonary status continued observation and repeat imaging studies in 5-6 months would be the way to go at this time. The plan is to repeat imaging  studies in May 2017.  3. Follow-up: Will be in May 2017 after repeat imaging studies.    [facility], MD 12/2/201610:06 AM

## 2015-06-08 NOTE — Telephone Encounter (Signed)
per pof to sch pt appt-gave pt copy of avs-adv [facility] willc all to sch CT °

## 2015-06-11 ENCOUNTER — Other Ambulatory Visit: Payer: Self-pay | Admitting: Internal Medicine

## 2015-06-11 ENCOUNTER — Telehealth: Payer: Self-pay | Admitting: Internal Medicine

## 2015-06-11 MED ORDER — TIOTROPIUM BROMIDE MONOHYDRATE 18 MCG IN CAPS: 18.0000 ug | ORAL_CAPSULE | Freq: Every morning | RESPIRATORY_TRACT | Status: DC

## 2015-06-11 NOTE — Telephone Encounter (Signed)
Called spoke with pt. She needed refill on spiriva. I have sent this in. Nothing further needed

## 2015-06-12 ENCOUNTER — Ambulatory Visit: Payer: Self-pay | Admitting: General Surgery

## 2015-06-12 NOTE — H&P (Signed)
History of Present Illness Ralene Ok MD; 05/16/2015 2:53 PM) The patient is a 64 year old female who presents with an incisional hernia. Patient is a 64 year old female who is referred by Dr. Tresa Moore for evaluation of an incisional hernia. Patient had a laparoscopic-assisted nephrectomy approximately 1 year ago. This was due to renal cell carcinoma. Subsequent to this the patient developed a incisional hernia. Dr. Tresa Moore states this is approximate 7 cm in width. She has had a CT scan at their office however those images are not available to me today.  Patient does have a history of COPD and sees Dr. Chase Caller is her pulmonologist Patient also sees Dr. Julianne Handler as her cardiologist secondary to A. fib, Elliquis, and heart stent.     Other Problems Elbert Ewings, CMA; 05/16/2015 2:22 PM) Anxiety Disorder Atrial Fibrillation Cancer Chronic Obstructive Lung Disease Emphysema Of Lung High blood pressure Home Oxygen Use Hypercholesterolemia Kidney Stone Myocardial infarction Vascular Disease Ventral Hernia Repair  Past Surgical History Elbert Ewings, CMA; 05/16/2015 2:22 PM) Bypass Surgery for Poor Blood Flow to Legs Nephrectomy Left. Oral Surgery Tonsillectomy  Diagnostic Studies History Elbert Ewings, [place]; 05/16/2015 2:22 PM) Colonoscopy never Pap Smear 1-5 years ago  Allergies Elbert Ewings, CMA; 05/16/2015 2:23 PM) Amoxicillin ER *PENICILLINS* Codeine Sulfate *ANALGESICS - OPIOID*  Medication History Elbert Ewings, CMA; 05/16/2015 2:24 PM) Metoprolol Tartrate (50MG  Tablet, Oral) Active. Furosemide (20MG  Tablet, Oral) Active. Albuterol Sulfate HFA (108 (90 Base)MCG/ACT Aerosol Soln, Inhalation) Active. Dulera (200-5MCG/ACT Aerosol, Inhalation) Active. Lexapro (20MG  Tablet, Oral) Active. Potassium Chloride (10MEQ Tablet ER, Oral) Active. Zocor (20MG  Tablet, Oral) Active. Spiriva HandiHaler (18MCG Capsule, Inhalation) Active. Medications  Reconciled  Social History Elbert Ewings, [place]; 05/16/2015 2:22 PM) Alcohol use Remotely quit alcohol use. Caffeine use Coffee. No drug use Tobacco use Former smoker.  Family History Elbert Ewings, [place]; 05/16/2015 2:22 PM) Alcohol Abuse Father. Arthritis Mother. Breast Cancer Mother. Heart Disease Mother. Heart disease in female family member before age 3 Hypertension Mother. Ovarian Cancer Mother. Respiratory Condition Father. Seizure disorder Brother.  Pregnancy / Birth History Elbert Ewings, CMA; 05/16/2015 2:22 PM) Age at menarche 62 years. Age of menopause <47 Gravida 3 Maternal age 49-20 Para 3    Review of Systems Elbert Ewings CMA; 05/16/2015 2:22 PM) General Not Present- Appetite Loss, Chills, Fatigue, Fever, Night Sweats, Weight Gain and Weight Loss. Skin Not Present- Change in Wart/Mole, Dryness, Hives, Jaundice, New Lesions, Non-Healing Wounds, Rash and Ulcer. HEENT Present- Wears glasses/contact lenses. Not Present- Earache, Hearing Loss, Hoarseness, Nose Bleed, Oral Ulcers, Ringing in the Ears, Seasonal Allergies, Sinus Pain, Sore Throat, Visual Disturbances and Yellow Eyes. Respiratory Present- Difficulty Breathing and Wheezing. Not Present- Bloody sputum, Chronic Cough and Snoring. Cardiovascular Not Present- Chest Pain, Difficulty Breathing Lying Down, Leg Cramps, Palpitations, Rapid Heart Rate, Shortness of Breath and Swelling of Extremities. Gastrointestinal Not Present- Abdominal Pain, Bloating, Bloody Stool, Change in Bowel Habits, Chronic diarrhea, Constipation, Difficulty Swallowing, Excessive gas, Gets full quickly at meals, Hemorrhoids, Indigestion, Nausea, Rectal Pain and Vomiting. Female Genitourinary Not Present- Frequency, Nocturia, Painful Urination, Pelvic Pain and Urgency. Musculoskeletal Not Present- Back Pain, Joint Pain, Joint Stiffness, Muscle Pain, Muscle Weakness and Swelling of Extremities. Neurological Not Present- Decreased  Memory, Fainting, Headaches, Numbness, Seizures, Tingling, Tremor, Trouble walking and Weakness. Psychiatric Present- Anxiety. Not Present- Bipolar, Change in Sleep Pattern, Depression, Fearful and Frequent crying. Endocrine Not Present- Cold Intolerance, Excessive Hunger, Hair Changes, Heat Intolerance, Hot flashes and New Diabetes. Hematology Not Present- Easy Bruising, Excessive bleeding, Gland problems, HIV  and Persistent Infections.  Vitals Elbert Ewings CMA; 05/16/2015 2:25 PM) 05/16/2015 2:24 PM Weight: 195 lb Height: 67in Body Surface Area: 2 m Body Mass Index: 30.54 kg/m  Temp.: 97.35F(Temporal)  Pulse: 100 (Regular)  BP: 130/68 (Sitting, Left Arm, Standard)       Physical Exam Ralene Ok MD; 05/16/2015 2:51 PM) General Mental Status-Alert. General Appearance-Consistent with stated age. Hydration-Well hydrated. Voice-Normal.  Head and Neck Head-normocephalic, atraumatic with no lesions or palpable masses. Trachea-midline. Thyroid Gland Characteristics - normal size and consistency.  Chest and Lung Exam Chest and lung exam reveals -quiet, even and easy respiratory effort with no use of accessory muscles and on auscultation, normal breath sounds, no adventitious sounds and normal vocal resonance. Inspection Chest Wall - Normal. Back - normal.  Cardiovascular Cardiovascular examination reveals -normal heart sounds, regular rate and rhythm with no murmurs and normal pedal pulses bilaterally.  Abdomen Inspection Skin - Scar - no surgical scars. Hernias - Incisional - Reducible(Difficult to assess with secondary to scar tissue just immediately under the incision site.). Palpation/Percussion Normal exam - Soft, Non Tender, No Rebound tenderness, No Rigidity (guarding) and No hepatosplenomegaly. Auscultation Normal exam - Bowel sounds normal.    Assessment & Plan Ralene Ok MD; 05/16/2015 2:53 PM) INCISIONAL HERNIA, WITHOUT  OBSTRUCTION OR GANGRENE (K43.2) Impression: 64 year old female with an incisional hernia secondary to laparoscopic assisted left nephrectomy.  1. We'll proceed to the operative for an open incisional hernia repair with mesh,TAR versus retrorectus.  2. All risks and benefits were discussed with the patient, to generally include infection, bleeding, damage to surrounding structures, acute and chronic nerve pain, and recurrence. Alternatives were offered and described.  All questions were answered and the patient voiced understanding of the procedure and wishes to proceed at this point.

## 2015-06-20 ENCOUNTER — Telehealth (HOSPITAL_COMMUNITY): Payer: Self-pay | Admitting: *Deleted

## 2015-06-20 NOTE — Telephone Encounter (Signed)
Patient given detailed instructions per Myocardial Perfusion Study Information Sheet for the test on 06/22/15 at 930. Patient notified to arrive 15 minutes early and that it is imperative to arrive on time for appointment to keep from having the test rescheduled.  If you need to cancel or reschedule your appointment, please call the office within 24 hours of your appointment. Failure to do so may result in a cancellation of your appointment, and a $50 no show fee. Patient verbalized understanding. Hubbard Robinson, RN

## 2015-06-22 ENCOUNTER — Ambulatory Visit (HOSPITAL_COMMUNITY): Payer: Medicaid Other | Attending: Cardiology

## 2015-06-22 DIAGNOSIS — I2510 Atherosclerotic heart disease of native coronary artery without angina pectoris: Secondary | ICD-10-CM | POA: Insufficient documentation

## 2015-06-22 DIAGNOSIS — R42 Dizziness and giddiness: Secondary | ICD-10-CM | POA: Diagnosis not present

## 2015-06-22 DIAGNOSIS — R9439 Abnormal result of other cardiovascular function study: Secondary | ICD-10-CM | POA: Insufficient documentation

## 2015-06-22 DIAGNOSIS — I779 Disorder of arteries and arterioles, unspecified: Secondary | ICD-10-CM | POA: Diagnosis not present

## 2015-06-22 DIAGNOSIS — R0602 Shortness of breath: Secondary | ICD-10-CM | POA: Diagnosis not present

## 2015-06-22 DIAGNOSIS — Z8673 Personal history of transient ischemic attack (TIA), and cerebral infarction without residual deficits: Secondary | ICD-10-CM | POA: Diagnosis not present

## 2015-06-22 DIAGNOSIS — I10 Essential (primary) hypertension: Secondary | ICD-10-CM | POA: Insufficient documentation

## 2015-06-22 LAB — MYOCARDIAL PERFUSION IMAGING
CHL CUP NUCLEAR SDS: 5
CHL CUP RESTING HR STRESS: 88 {beats}/min
CSEPPHR: 100 {beats}/min
LHR: 0.3
LVDIAVOL: 72 mL
LVSYSVOL: 36 mL
NUC STRESS TID: 1.03
SRS: 14
SSS: 17

## 2015-06-22 MED ORDER — TECHNETIUM TC 99M SESTAMIBI GENERIC - CARDIOLITE
31.3000 | Freq: Once | INTRAVENOUS | Status: AC | PRN
  Administered 2015-06-22: 31.3 via INTRAVENOUS

## 2015-06-22 MED ORDER — TECHNETIUM TC 99M SESTAMIBI GENERIC - CARDIOLITE
10.5000 | Freq: Once | INTRAVENOUS | Status: AC | PRN
  Administered 2015-06-22: 11 via INTRAVENOUS

## 2015-06-22 MED ORDER — REGADENOSON 0.4 MG/5ML IV SOLN
0.4000 mg | Freq: Once | INTRAVENOUS | Status: AC
  Administered 2015-06-22: 0.4 mg via INTRAVENOUS

## 2015-06-25 ENCOUNTER — Telehealth: Payer: Self-pay | Admitting: *Deleted

## 2015-06-25 NOTE — Telephone Encounter (Signed)
Stress test reviewed with Dr. Acie Fredrickson (office DOD).  If pt needs surgery soon will need office visit prior to being cleared.  If not being done soon can wait for Dr. Angelena Form to review if pt agreeable with this.  I spoke with pt and she states surgery is not urgent.  She would like to wait until Dr. Angelena Form is back to review results and provide recommendations.

## 2015-07-03 NOTE — Telephone Encounter (Signed)
Informed patient of stress test results. She is agreeable to wait until Dr. Angelena Form is back in the office for paperwork to be completed.

## 2015-07-03 NOTE — Telephone Encounter (Signed)
Based on the stress test, I think she can be cleared. NO ischemia on stress test. I can fill out her paperwork this week when I am back in the office or I can write a letter in [facility] if necessary. chris

## 2015-07-05 ENCOUNTER — Ambulatory Visit: Payer: Medicaid Other | Admitting: Adult Health

## 2015-07-06 ENCOUNTER — Encounter: Payer: Self-pay | Admitting: Cardiovascular Disease

## 2015-07-06 NOTE — Telephone Encounter (Signed)
Clearance letter placed in medical records box to fax to [facility].

## 2015-07-10 ENCOUNTER — Ambulatory Visit: Payer: Medicaid Other | Admitting: Adult Health

## 2015-07-10 LAB — PULMONARY FUNCTION TEST
DL/VA % PRED: 59 %
DL/VA: 2.99 ml/min/mmHg/L
DLCO UNC % PRED: 46 %
DLCO UNC: 12.65 ml/min/mmHg
FEF 25-75 Post: 0.46 L/sec
FEF 25-75 Pre: 0.35 L/sec
FEF2575-%CHANGE-POST: 31 %
FEF2575-%PRED-POST: 19 %
FEF2575-%Pred-Pre: 14 %
FEV1-%CHANGE-POST: 12 %
FEV1-%Pred-Post: 40 %
FEV1-%Pred-Pre: 36 %
FEV1-PRE: 0.96 L
FEV1-Post: 1.08 L
FEV1FVC-%Change-Post: 5 %
FEV1FVC-%PRED-PRE: 61 %
FEV6-%Change-Post: 5 %
FEV6-%PRED-POST: 60 %
FEV6-%PRED-PRE: 57 %
FEV6-POST: 2.03 L
FEV6-PRE: 1.93 L
FEV6FVC-%CHANGE-POST: -1 %
FEV6FVC-%Pred-Post: 97 %
FEV6FVC-%Pred-Pre: 98 %
FVC-%Change-Post: 6 %
FVC-%PRED-PRE: 58 %
FVC-%Pred-Post: 62 %
FVC-POST: 2.17 L
FVC-PRE: 2.03 L
POST FEV6/FVC RATIO: 93 %
Post FEV1/FVC ratio: 50 %
Pre FEV1/FVC ratio: 47 %
Pre FEV6/FVC Ratio: 95 %
RV % PRED: 195 %
RV: 4.21 L
TLC % pred: 121 %
TLC: 6.49 L

## 2015-07-26 ENCOUNTER — Ambulatory Visit (INDEPENDENT_AMBULATORY_CARE_PROVIDER_SITE_OTHER): Payer: Medicaid Other | Admitting: Internal Medicine

## 2015-07-26 ENCOUNTER — Ambulatory Visit (INDEPENDENT_AMBULATORY_CARE_PROVIDER_SITE_OTHER): Payer: Medicaid Other | Admitting: Adult Health

## 2015-07-26 ENCOUNTER — Encounter: Payer: Self-pay | Admitting: Adult Health

## 2015-07-26 ENCOUNTER — Other Ambulatory Visit: Payer: Self-pay | Admitting: Cardiovascular Disease

## 2015-07-26 VITALS — BP 134/80 | HR 97 | Temp 98.0°F | Ht 67.0 in | Wt 198.0 lb

## 2015-07-26 DIAGNOSIS — J9611 Chronic respiratory failure with hypoxia: Secondary | ICD-10-CM

## 2015-07-26 DIAGNOSIS — J449 Chronic obstructive pulmonary disease, unspecified: Secondary | ICD-10-CM

## 2015-07-26 DIAGNOSIS — C649 Malignant neoplasm of unspecified kidney, except renal pelvis: Secondary | ICD-10-CM

## 2015-07-26 DIAGNOSIS — C7800 Secondary malignant neoplasm of unspecified lung: Secondary | ICD-10-CM

## 2015-07-26 DIAGNOSIS — I779 Disorder of arteries and arterioles, unspecified: Secondary | ICD-10-CM

## 2015-07-26 MED ORDER — ALBUTEROL SULFATE HFA 108 (90 BASE) MCG/ACT IN AERS: 2.0000 | INHALATION_SPRAY | Freq: Four times a day (QID) | RESPIRATORY_TRACT | Status: AC | PRN

## 2015-07-26 NOTE — Progress Notes (Signed)
PFT done today. 

## 2015-07-26 NOTE — Patient Instructions (Signed)
Continue on Dulera and Spiriva .  Continue on oxygen At bedtime   follow up Dr. Chase Caller in 3 months and As needed

## 2015-07-27 ENCOUNTER — Telehealth: Payer: Self-pay | Admitting: Internal Medicine

## 2015-07-27 NOTE — Telephone Encounter (Signed)
Allison [name]  gopt a inbox message from surgery wanting preop clearance for incisional hernia surgery. Do you mind incorporating an element of that into your note from yesterday? Pls call me if any questions  Thanks  Dr. Brand Males, M.D., [facility].C.P Pulmonary and Critical Care Medicine Staff Physician [facility] Pager: [phone], If no answer or between  15:00h - 7:00h: call [phone]  07/27/2015 7:18 AM

## 2015-08-01 DIAGNOSIS — J9610 Chronic respiratory failure, unspecified whether with hypoxia or hypercapnia: Secondary | ICD-10-CM | POA: Insufficient documentation

## 2015-08-01 NOTE — Assessment & Plan Note (Signed)
Compensated on present regimen without flare. Continue on current regimen 

## 2015-08-01 NOTE — Assessment & Plan Note (Signed)
Continue on oxygen At bedtime   follow up Dr. Chase Caller in 3 months and As needed

## 2015-08-01 NOTE — Progress Notes (Signed)
Subjective:    Patient ID: Allison [name], female    DOB: April 21, 1951, 65 y.o.   MRN: [id number]  HPI 65 yo female former smoker with severe CODP  Metastatic renal cancer (Nephrectomy 01/2014)   07/26/15  Follow up : Severe COPD  Pt returns for 2 month follow up for COPD and PFT results.  Pt. states breathing is baseline. SOB with activity at baseline with minimally productive cough with clear mucus.  Denies wheezing or chest pain/tightness. No hemoptysis , orthopnea or edema.  Remains on Dulera and Spiriva .  She is on Oxygen 2 l/m at bedtime . PFT done today results reviewed with pt - shows Post BD FEV1 at 40%, ratio 50 , FVC 62%, DLCO 46%, +BD response. (Similar to 2014 ) .  Pt has metastatic renal cancer to lung . Most recent CT chest shows stable disease in lung w/ no new lung lesions. No adenopathy.  Recent cardiac stress test with no ischemia noted in 06/2015 . Old defect noted    Past Medical History  Diagnosis Date  . COPD (chronic obstructive pulmonary disease) ([place])   . Tobacco abuse     hx of  . Coronary artery disease     a. 08/2009 NSTEMI s/p PCI/DES to LCX. Mod residual LAD dzs;  b. 02/2014 Nonischemic myoview.  . Hyperlipidemia   . Ischemic cardiomyopathy     a. Echo 08/14/09 with LVEF 40-45%;  b. 01/2014 Echo: EF 55-60%, basal-mid inflat and inf HK, triv MR/TR, mod dil LA/RA.  Marland Kitchen A-fib ([place])     a. Dx 01/2014 - rate controlled. b. Recurrence 03/2014 in setting of renal cell carcinoma surgery.  . Metastatic renal cell carcinoma to lung ([place]) 01/29/2014  . Acute pyelonephritis 01/28/2014  . Hypertension   . PVD (peripheral vascular disease) ([place])     a. 11/2010 L Fem to above knee Pop bypass;  b. 12/2011 Angio: patent L fem->pop graft, LCFZ 50-60%.  . Pneumonia     hx of  . Anxiety   . History of kidney stones   . Carotid disease, bilateral ([place])     a. 11/2011 U/S: RICA [id number], LICA [id number].  . Lung nodule     a. 1cm RLL nodule - followed by pulmonology.  . Shortness of breath  dyspnea    Current Outpatient Prescriptions on File Prior to Visit  Medication Sig Dispense Refill  . apixaban (ELIQUIS) 5 MG TABS tablet Take 1 tablet (5 mg total) by mouth 2 (two) times daily. 180 tablet 1  . diphenhydramine-acetaminophen (TYLENOL PM) 25-500 MG TABS Take 2 tablets by mouth daily as needed (for sleep).     . DULERA 200-5 MCG/ACT AERO 2 PUFFS FIRST THING IN AM AND THEN ANOTHER 2 PUFFS ABOUT 12 HOURS LATER 13 g 11  . escitalopram (LEXAPRO) 10 MG tablet Take 1 tablet (10 mg total) by mouth at bedtime. 90 tablet 2  . furosemide (LASIX) 20 MG tablet Take 1 tablet (20 mg total) by mouth every morning. 90 tablet 2  . metoprolol (LOPRESSOR) 50 MG tablet Take 100 mg(2 tablets) by mouth in the am and 75 mg(1 1/2 tablets) by mouth in the pm 315 tablet 3  . potassium chloride (K-DUR) 10 MEQ tablet Take 1 tablet (10 mEq total) by mouth daily. 30 tablet 3  . simvastatin (ZOCOR) 20 MG tablet Take 1 tablet (20 mg total) by mouth every evening. 90 tablet 1  . tiotropium (SPIRIVA) 18 MCG inhalation capsule Place 1 capsule (18  mcg total) into inhaler and inhale every morning. 30 capsule 6   No current facility-administered medications on file prior to visit.     Review of Systems    Constitutional:   No  weight loss, night sweats,  Fevers, chills, fatigue, or  lassitude.  HEENT:   No headaches,  Difficulty swallowing,  Tooth/dental problems, or  Sore throat,                No sneezing, itching, ear ache, nasal congestion, post nasal drip,   CV:  No chest pain,  Orthopnea, PND, swelling in lower extremities, anasarca, dizziness, palpitations, syncope.   GI  No heartburn, indigestion, abdominal pain, nausea, vomiting, diarrhea, change in bowel habits, loss of appetite, bloody stools.   Resp: No shortness of breath with exertion or at rest.  No excess mucus, no productive cough,  No non-productive cough,  No coughing up of blood.  No change in color of mucus.  No wheezing.  No chest wall  deformity  Skin: no rash or lesions.  GU: no dysuria, change in color of urine, no urgency or frequency.  No flank pain, no hematuria   MS:  No joint pain or swelling.  No decreased range of motion.  No back pain.  Psych:  No change in mood or affect. No depression or anxiety.  No memory loss.      Objective:   Physical Exam  Filed Vitals:   07/26/15 1551  BP: 134/80  Pulse: 97  Temp: 98 F (36.7 C)  Height: 5\' 7"  (1.702 m)  Weight: 198 lb (89.812 kg)  SpO2: 97%   GEN: A/Ox3; pleasant , NAD, elderly    HEENT:  [place]/AT,  EACs-clear, TMs-wnl, NOSE-clear, THROAT-clear, no lesions, no postnasal drip or exudate noted.   NECK:  Supple w/ fair ROM; no JVD; normal carotid impulses w/o bruits; no thyromegaly or nodules palpated; no lymphadenopathy.  RESP  Decreased BS in bases , .no accessory muscle use, no dullness to percussion  CARD:  RRR, no m/r/g  , no peripheral edema, pulses intact, no cyanosis or clubbing.  GI:   Soft & nt; nml bowel sounds; no organomegaly or masses detected.  Musco: Warm bil, no deformities or joint swelling noted.   Neuro: alert, no focal deficits noted.    Skin: Warm, no lesions or rashes        Assessment & Plan:

## 2015-08-01 NOTE — Assessment & Plan Note (Signed)
Cont follow up with serial CT  Follow up with Oncology as planned

## 2015-08-02 ENCOUNTER — Other Ambulatory Visit: Payer: Self-pay

## 2015-08-02 MED ORDER — POTASSIUM CHLORIDE ER 10 MEQ PO TBCR: 10.0000 meq | EXTENDED_RELEASE_TABLET | Freq: Every day | ORAL | Status: DC

## 2015-08-02 MED ORDER — APIXABAN 5 MG PO TABS: 5.0000 mg | ORAL_TABLET | Freq: Two times a day (BID) | ORAL | Status: DC

## 2015-08-08 ENCOUNTER — Ambulatory Visit (HOSPITAL_COMMUNITY)
Admission: RE | Admit: 2015-08-08 | Discharge: 2015-08-08 | Disposition: A | Discharge status: Home / Self Care | Payer: Medicaid Other | Source: Ambulatory Visit | Attending: Cardiovascular Disease | Admitting: Cardiovascular Disease

## 2015-08-08 DIAGNOSIS — I10 Essential (primary) hypertension: Secondary | ICD-10-CM | POA: Diagnosis not present

## 2015-08-08 DIAGNOSIS — I739 Peripheral vascular disease, unspecified: Secondary | ICD-10-CM | POA: Insufficient documentation

## 2015-08-08 DIAGNOSIS — Z87891 Personal history of nicotine dependence: Secondary | ICD-10-CM | POA: Diagnosis not present

## 2015-08-08 DIAGNOSIS — R938 Abnormal findings on diagnostic imaging of other specified body structures: Secondary | ICD-10-CM | POA: Diagnosis not present

## 2015-08-08 DIAGNOSIS — I779 Disorder of arteries and arterioles, unspecified: Secondary | ICD-10-CM

## 2015-08-08 DIAGNOSIS — I6523 Occlusion and stenosis of bilateral carotid arteries: Secondary | ICD-10-CM

## 2015-08-08 DIAGNOSIS — E785 Hyperlipidemia, unspecified: Secondary | ICD-10-CM | POA: Insufficient documentation

## 2015-08-15 ENCOUNTER — Telehealth: Payer: Self-pay | Admitting: Cardiovascular Disease

## 2015-08-15 NOTE — Telephone Encounter (Signed)
[facility] is calling about surgery clearance please advise they csn be reached @ [phone] [facility] press option to speak to nurse.Hillery Hunter

## 2015-08-15 NOTE — Telephone Encounter (Signed)
New message     Request for surgical clearance:  What type of surgery is being performed? Hernia repair 1. When is this surgery scheduled? Pending clearance  Are there any medications that need to be held prior to surgery and how long? Hold eliquis prior to procedure 2. Name of physician performing surgery?  Dr Rosendo Gros  3. What is your office phone and fax number? [phone]

## 2015-08-15 NOTE — Telephone Encounter (Signed)
Pt was seen by TP on 07/26/15. TP, please advise on addendum to OV note as stated by MR below about surgical clearance.

## 2015-08-15 NOTE — Telephone Encounter (Signed)
Request for surgical clearance:  What type of surgery is being performed? Hernia repair 1. When is this surgery scheduled? Pending clearance  Are there any medications that need to be held prior to surgery and how long? Hold eliquis prior to procedure 2. Name of physician performing surgery? Dr Rosendo Gros  3. What is your office phone and fax number? [phone]   08/15/2015 03:18 PM Phone (Incoming) [email or web address]  [email or web address]    Phone: [phone]

## 2015-08-17 ENCOUNTER — Encounter: Payer: Self-pay | Admitting: Cardiovascular Disease

## 2015-08-17 NOTE — Telephone Encounter (Signed)
Letter placed in med records box to be faxed.

## 2015-08-17 NOTE — Telephone Encounter (Signed)
See letter in chart. Can we fax to [facility] (Dr. Rosendo Gros)? Thanks, chris

## 2015-08-21 NOTE — Telephone Encounter (Signed)
Called and spoke with triage nurse - states that surgical clearance will need to be faxed to [phone] ATTN: Dr Rosendo Gros. Clearance has been faxed - Nothing further needed.

## 2015-08-21 NOTE — Telephone Encounter (Signed)
Pt had no sign change in clinical status on office visit. 07/26/15 w/ stable PFT . Please forward Dr. Chase Caller note from 05/25/15 for surgical clearance to [facility] please .

## 2015-08-21 NOTE — Telephone Encounter (Signed)
Spoke with Dr. Chase Caller , surgical clearance visit was in Nov 2016 .

## 2015-08-21 NOTE — Telephone Encounter (Signed)
I did preop clearance Nov 2016 - surgeon advised to see that note. I believe I might have even sent to surgeon. IF no change in health status overal since then - risk remains the same - moderate risk  Dr. Brand Males, M.D., [facility].C.P Pulmonary and Critical Care Medicine Staff Physician [place] [facility] Pager: [phone], If no answer or between  15:00h - 7:00h: call [phone]  08/21/2015 1:34 PM

## 2015-08-24 ENCOUNTER — Telehealth: Payer: Self-pay | Admitting: Internal Medicine

## 2015-08-24 MED ORDER — PREDNISONE 10 MG PO TABS: ORAL_TABLET | ORAL | Status: DC

## 2015-08-24 MED ORDER — DOXYCYCLINE HYCLATE 100 MG PO TABS: 100.0000 mg | ORAL_TABLET | Freq: Two times a day (BID) | ORAL | Status: DC

## 2015-08-24 NOTE — Telephone Encounter (Signed)
Take doxycycline 100mg  po twice daily x 5 days; take after meals and avoid sunlight   Please take prednisone 40 mg x1 day, then 30 mg x1 day, then 20 mg x1 day, then 10 mg x1 day, and then 5 mg x1 day and stop  She has aecopd  Allergies  Allergen Reactions  . Ace Inhibitors Cough  . Amoxicillin-Pot Clavulanate Hives  . Codeine Nausea And Vomiting

## 2015-08-24 NOTE — Telephone Encounter (Signed)
Called spoke with pt. She c/o prod cough (yellow-green phlem) and increase SOB w/ exertion x couple days. denis any f/c/s/n/v, no wheezing, no chest tx. She has been taking mucinex. Wants something called in. Please advise MR thanks

## 2015-08-24 NOTE — Telephone Encounter (Signed)
Spoke with pt. She is aware of MR's recommendations. Rxs have been sent in. Nothing further was needed. 

## 2015-09-17 ENCOUNTER — Ambulatory Visit: Payer: Self-pay | Admitting: General Surgery

## 2015-09-17 NOTE — H&P (Signed)
History of Present Illness Allison Ok MD; 06/12/2015 2:24 PM) Patient words: hernia.  The patient is a 65 year old female who presents with an incisional hernia. Patient is a 65 year old female who is referred by Dr. Tresa Moore for evaluation of an incisional hernia. Patient had a laparoscopic-assisted nephrectomy approximately 1 year ago. This was due to renal cell carcinoma. Subsequent to this the patient developed a incisional hernia. Patient's CT scan revealed approximate 6 cm incisional hernia with incarcerated fat. Patient states that she's had no further complications since her last visit. Patient is currently seeing Dr. Julianne Handler is to undergo a stress test in the next several weeks. He will clear her after any further evaluation the patient is currently on [place] and will need to be off this for 2-3 days prior to surgery.  Patient does have a history of COPD and sees Dr. Chase Caller is her pulmonologist Patient also sees Dr. Julianne Handler as her cardiologist secondary to A. fib, Elliquis, and heart stent.     Allergies Malachi Bonds, CMA; 06/12/2015 2:01 PM) Amoxicillin ER *PENICILLINS* Codeine Sulfate *ANALGESICS - OPIOID*  Medication History (Chemira Jones, CMA; 06/12/2015 2:01 PM) Metoprolol Tartrate (50MG  Tablet, Oral) Active. Furosemide (20MG  Tablet, Oral) Active. Albuterol Sulfate HFA (108 (90 Base)MCG/ACT Aerosol Soln, Inhalation) Active. Dulera (200-5MCG/ACT Aerosol, Inhalation) Active. Lexapro (20MG  Tablet, Oral) Active. Potassium Chloride (10MEQ Tablet ER, Oral) Active. Zocor (20MG  Tablet, Oral) Active. Spiriva HandiHaler (18MCG Capsule, Inhalation) Active. Medications Reconciled    Review of Systems Allison Ok, MD; 06/12/2015 2:26 PM) General Not Present- Appetite Loss, Chills, Fatigue, Fever, Night Sweats, Weight Gain and Weight Loss. Skin Not Present- Change in Wart/Mole, Dryness, Hives, Jaundice, New Lesions, Non-Healing Wounds, Rash and  Ulcer. HEENT Present- Wears glasses/contact lenses. Not Present- Earache, Hearing Loss, Hoarseness, Nose Bleed, Oral Ulcers, Ringing in the Ears, Seasonal Allergies, Sinus Pain, Sore Throat, Visual Disturbances and Yellow Eyes. Respiratory Present- Difficulty Breathing and Wheezing. Not Present- Bloody sputum, Chronic Cough and Snoring. Cardiovascular Not Present- Chest Pain, Difficulty Breathing Lying Down, Leg Cramps, Palpitations, Rapid Heart Rate, Shortness of Breath and Swelling of Extremities. Gastrointestinal Not Present- Abdominal Pain, Bloating, Bloody Stool, Change in Bowel Habits, Chronic diarrhea, Constipation, Difficulty Swallowing, Excessive gas, Gets full quickly at meals, Hemorrhoids, Indigestion, Nausea, Rectal Pain and Vomiting. Female Genitourinary Not Present- Frequency, Nocturia, Painful Urination, Pelvic Pain and Urgency. Musculoskeletal Not Present- Back Pain, Joint Pain, Joint Stiffness, Muscle Pain, Muscle Weakness and Swelling of Extremities. Neurological Not Present- Decreased Memory, Fainting, Headaches, Numbness, Seizures, Tingling, Tremor, Trouble walking and Weakness. Psychiatric Present- Anxiety. Not Present- Bipolar, Change in Sleep Pattern, Depression, Fearful and Frequent crying. Endocrine Not Present- Cold Intolerance, Excessive Hunger, Hair Changes, Heat Intolerance, Hot flashes and New Diabetes. Hematology Not Present- Easy Bruising, Excessive bleeding, Gland problems, HIV and Persistent Infections.  Vitals (Chemira Jones CMA; 06/12/2015 2:01 PM) 06/12/2015 2:01 PM Weight: 197.4 lb Height: 67in Body Surface Area: 2.01 m Body Mass Index: 30.92 kg/m  Temp.: 97.19F(Oral)  Pulse: 82 (Regular)  BP: 134/68 (Sitting, Left Arm, Standard)       Physical Exam Allison Ok MD; 06/12/2015 2:24 PM) General Mental Status-Alert. General Appearance-Consistent with stated age. Hydration-Well hydrated. Voice-Normal.  Head and  Neck Head-normocephalic, atraumatic with no lesions or palpable masses. Trachea-midline. Thyroid Gland Characteristics - normal size and consistency.  Chest and Lung Exam Chest and lung exam reveals -quiet, even and easy respiratory effort with no use of accessory muscles and on auscultation, normal breath sounds, no adventitious sounds and normal vocal resonance. Inspection  Chest Wall - Normal. Back - normal.  Cardiovascular Cardiovascular examination reveals -normal heart sounds, regular rate and rhythm with no murmurs and normal pedal pulses bilaterally.  Abdomen Inspection Skin - Scar - no surgical scars. Hernias - Incisional - Reducible(incarcerated incisional hernia ~6cm). Palpation/Percussion Normal exam - Soft, Non Tender, No Rebound tenderness, No Rigidity (guarding) and No hepatosplenomegaly. Auscultation Normal exam - Bowel sounds normal.    Assessment & Plan Allison Ok MD; 06/12/2015 2:26 PM) INCISIONAL HERNIA, WITHOUT OBSTRUCTION OR GANGRENE (K43.2) Impression: 65 year old female with an incisional hernia secondary to laparoscopic assisted left nephrectomy.  1. We will proceed to the operating for open incisional hernia repair with mesh after clearance by Dr. Chase Caller and Dr.McAlhaney . 3. I discussed with her the risks and benefits of the procedure to include but not limited to: Infection, bleeding, damage to structures, possible recurrence. The patient was understanding and wished to proceed. Current Plans

## 2015-09-26 ENCOUNTER — Encounter (HOSPITAL_COMMUNITY)
Admission: RE | Admit: 2015-09-26 | Discharge: 2015-09-26 | Disposition: A | Discharge status: Home / Self Care | Payer: Medicaid Other | Source: Ambulatory Visit | Attending: General Surgery | Admitting: General Surgery

## 2015-09-26 ENCOUNTER — Encounter (HOSPITAL_COMMUNITY): Payer: Self-pay

## 2015-09-26 DIAGNOSIS — Z01812 Encounter for preprocedural laboratory examination: Secondary | ICD-10-CM | POA: Diagnosis present

## 2015-09-26 DIAGNOSIS — K432 Incisional hernia without obstruction or gangrene: Secondary | ICD-10-CM | POA: Diagnosis not present

## 2015-09-26 LAB — CBC
HEMATOCRIT: 43.1 % (ref 36.0–46.0)
Hemoglobin: 14.1 g/dL (ref 12.0–15.0)
MCH: 29.3 pg (ref 26.0–34.0)
MCHC: 32.7 g/dL (ref 30.0–36.0)
MCV: 89.4 fL (ref 78.0–100.0)
PLATELETS: 313 10*3/uL (ref 150–400)
RBC: 4.82 MIL/uL (ref 3.87–5.11)
RDW: 14.6 % (ref 11.5–15.5)
WBC: 12.5 10*3/uL — AB (ref 4.0–10.5)

## 2015-09-26 LAB — BASIC METABOLIC PANEL
Anion gap: 10 (ref 5–15)
BUN: 11 mg/dL (ref 6–20)
CO2: 24 mmol/L (ref 22–32)
CREATININE: 1.2 mg/dL — AB (ref 0.44–1.00)
Calcium: 9.9 mg/dL (ref 8.9–10.3)
Chloride: 108 mmol/L (ref 101–111)
GFR calc Af Amer: 55 mL/min — ABNORMAL LOW (ref >60)
GFR, EST NON AFRICAN AMERICAN: 47 mL/min — AB (ref >60)
GLUCOSE: 110 mg/dL — AB (ref 65–99)
POTASSIUM: 4.9 mmol/L (ref 3.5–5.1)
SODIUM: 142 mmol/L (ref 135–145)

## 2015-09-26 NOTE — Pre-Procedure Instructions (Signed)
    Allison [name]  09/26/2015      [place] [place] [facility] [place] [address] Phone: [phone] Fax: [phone]    Your procedure is scheduled on 10-01-2015  Monday  .  Report to [facility] Admitting at 5:30A.M.   Call this number if you have problems the morning of surgery:  [phone]   Remember:  Do not eat food or drink liquids after midnight.   Take these medicines the morning of surgery with A SIP OF WATER Albuterol  Inhaler if needed,Dulera inhaler,lexapro,metoprolol(Lopressor),Potassium chloride,Spiriva inhalation capsule             Stop Eliquis 3 days prior to surgery             Stop ibuprofen,motrin,naproxen,goody powders,aspirin, any over the counter supplements,vitamins 5 days prior to surgery   Do not wear jewelry, make-up or nail polish.  Do not wear lotions, powders, or perfumes.  You may not wear deodorant.  Do not shave 48 hours prior to surgery.    Do not bring valuables to the hospital.  [facility] is not responsible for any belongings or valuables.  Contacts, dentures or bridgework may not be worn into surgery.  Leave your suitcase in the car.  After surgery it may be brought to your room.  For patients admitted to the hospital, discharge time will be determined by your treatment team.  Patients discharged the day of surgery will not be allowed to drive home.    Special instructions:  See attached sheet for instructions on CHG showers,  Please read over the following fact sheets that you were given. Pain Booklet, Coughing and Deep Breathing and Surgical Site Infection Prevention

## 2015-09-30 MED ORDER — VANCOMYCIN HCL 10 G IV SOLR
1500.0000 mg | INTRAVENOUS | Status: AC
  Administered 2015-10-01: 1500 mg via INTRAVENOUS
  Filled 2015-09-30: qty 1500

## 2015-10-01 ENCOUNTER — Encounter (HOSPITAL_COMMUNITY)
Admission: RE | Disposition: A | Discharge status: Home / Self Care | Payer: Self-pay | Source: Ambulatory Visit | Attending: General Surgery

## 2015-10-01 ENCOUNTER — Inpatient Hospital Stay (HOSPITAL_COMMUNITY): Payer: Medicaid Other | Admitting: Certified Registered Nurse Anesthetist

## 2015-10-01 ENCOUNTER — Observation Stay (HOSPITAL_COMMUNITY): Payer: Medicaid Other

## 2015-10-01 ENCOUNTER — Inpatient Hospital Stay (HOSPITAL_COMMUNITY)
Admission: RE | Admit: 2015-10-01 | Discharge: 2015-10-05 | DRG: 987 | Disposition: A | Discharge status: Home / Self Care | Payer: Medicaid Other | Source: Ambulatory Visit | Attending: General Surgery | Admitting: General Surgery

## 2015-10-01 DIAGNOSIS — J9601 Acute respiratory failure with hypoxia: Secondary | ICD-10-CM | POA: Diagnosis not present

## 2015-10-01 DIAGNOSIS — R0602 Shortness of breath: Secondary | ICD-10-CM | POA: Diagnosis not present

## 2015-10-01 DIAGNOSIS — I110 Hypertensive heart disease with heart failure: Secondary | ICD-10-CM | POA: Diagnosis present

## 2015-10-01 DIAGNOSIS — Z9889 Other specified postprocedural states: Secondary | ICD-10-CM

## 2015-10-01 DIAGNOSIS — G92 Toxic encephalopathy: Secondary | ICD-10-CM | POA: Diagnosis present

## 2015-10-01 DIAGNOSIS — I480 Paroxysmal atrial fibrillation: Secondary | ICD-10-CM | POA: Diagnosis present

## 2015-10-01 DIAGNOSIS — Z8719 Personal history of other diseases of the digestive system: Secondary | ICD-10-CM

## 2015-10-01 DIAGNOSIS — Y92230 Patient room in hospital as the place of occurrence of the external cause: Secondary | ICD-10-CM | POA: Diagnosis present

## 2015-10-01 DIAGNOSIS — E669 Obesity, unspecified: Secondary | ICD-10-CM | POA: Diagnosis present

## 2015-10-01 DIAGNOSIS — Z7901 Long term (current) use of anticoagulants: Secondary | ICD-10-CM

## 2015-10-01 DIAGNOSIS — I509 Heart failure, unspecified: Secondary | ICD-10-CM | POA: Diagnosis present

## 2015-10-01 DIAGNOSIS — Z6831 Body mass index (BMI) 31.0-31.9, adult: Secondary | ICD-10-CM

## 2015-10-01 DIAGNOSIS — R0902 Hypoxemia: Secondary | ICD-10-CM

## 2015-10-01 DIAGNOSIS — I255 Ischemic cardiomyopathy: Secondary | ICD-10-CM | POA: Diagnosis present

## 2015-10-01 DIAGNOSIS — E785 Hyperlipidemia, unspecified: Secondary | ICD-10-CM | POA: Diagnosis present

## 2015-10-01 DIAGNOSIS — K430 Incisional hernia with obstruction, without gangrene: Secondary | ICD-10-CM | POA: Diagnosis present

## 2015-10-01 DIAGNOSIS — I9789 Other postprocedural complications and disorders of the circulatory system, not elsewhere classified: Principal | ICD-10-CM | POA: Diagnosis present

## 2015-10-01 DIAGNOSIS — I2510 Atherosclerotic heart disease of native coronary artery without angina pectoris: Secondary | ICD-10-CM | POA: Diagnosis present

## 2015-10-01 DIAGNOSIS — J441 Chronic obstructive pulmonary disease with (acute) exacerbation: Secondary | ICD-10-CM | POA: Diagnosis present

## 2015-10-01 DIAGNOSIS — Z905 Acquired absence of kidney: Secondary | ICD-10-CM

## 2015-10-01 DIAGNOSIS — N179 Acute kidney failure, unspecified: Secondary | ICD-10-CM | POA: Diagnosis present

## 2015-10-01 DIAGNOSIS — Z885 Allergy status to narcotic agent status: Secondary | ICD-10-CM

## 2015-10-01 DIAGNOSIS — J95821 Acute postprocedural respiratory failure: Secondary | ICD-10-CM | POA: Diagnosis present

## 2015-10-01 DIAGNOSIS — Z7951 Long term (current) use of inhaled steroids: Secondary | ICD-10-CM

## 2015-10-01 DIAGNOSIS — Z79899 Other long term (current) drug therapy: Secondary | ICD-10-CM

## 2015-10-01 DIAGNOSIS — Z85528 Personal history of other malignant neoplasm of kidney: Secondary | ICD-10-CM

## 2015-10-01 DIAGNOSIS — T402X5A Adverse effect of other opioids, initial encounter: Secondary | ICD-10-CM | POA: Diagnosis present

## 2015-10-01 DIAGNOSIS — Z87891 Personal history of nicotine dependence: Secondary | ICD-10-CM

## 2015-10-01 DIAGNOSIS — F419 Anxiety disorder, unspecified: Secondary | ICD-10-CM | POA: Diagnosis present

## 2015-10-01 DIAGNOSIS — I739 Peripheral vascular disease, unspecified: Secondary | ICD-10-CM | POA: Diagnosis present

## 2015-10-01 DIAGNOSIS — I252 Old myocardial infarction: Secondary | ICD-10-CM

## 2015-10-01 DIAGNOSIS — Z955 Presence of coronary angioplasty implant and graft: Secondary | ICD-10-CM

## 2015-10-01 DIAGNOSIS — Z880 Allergy status to penicillin: Secondary | ICD-10-CM

## 2015-10-01 DIAGNOSIS — R339 Retention of urine, unspecified: Secondary | ICD-10-CM | POA: Diagnosis present

## 2015-10-01 DIAGNOSIS — C7800 Secondary malignant neoplasm of unspecified lung: Secondary | ICD-10-CM | POA: Diagnosis present

## 2015-10-01 HISTORY — PX: INSERTION OF MESH: SHX5868

## 2015-10-01 HISTORY — PX: INCISIONAL HERNIA REPAIR: SHX193

## 2015-10-01 LAB — BLOOD GAS, ARTERIAL
Acid-base deficit: 4.1 mmol/L — ABNORMAL HIGH (ref 0.0–2.0)
Bicarbonate: 20.7 mEq/L (ref 20.0–24.0)
Delivery systems: POSITIVE
Drawn by: 280981
EXPIRATORY PAP: 6
FIO2: 0.4
INSPIRATORY PAP: 12
O2 SAT: 98.8 %
PATIENT TEMPERATURE: 98.6
PCO2 ART: 39.6 mmHg (ref 35.0–45.0)
PO2 ART: 144 mmHg — AB (ref 80.0–100.0)
RATE: 12 resp/min
TCO2: 21.9 mmol/L (ref 0–100)
pH, Arterial: 7.338 — ABNORMAL LOW (ref 7.350–7.450)

## 2015-10-01 LAB — BASIC METABOLIC PANEL
Anion gap: 12 (ref 5–15)
BUN: 17 mg/dL (ref 6–20)
CALCIUM: 8.8 mg/dL — AB (ref 8.9–10.3)
CO2: 21 mmol/L — AB (ref 22–32)
CREATININE: 1.64 mg/dL — AB (ref 0.44–1.00)
Chloride: 106 mmol/L (ref 101–111)
GFR calc non Af Amer: 32 mL/min — ABNORMAL LOW (ref >60)
GFR, EST AFRICAN AMERICAN: 37 mL/min — AB (ref >60)
GLUCOSE: 140 mg/dL — AB (ref 65–99)
Potassium: 4.5 mmol/L (ref 3.5–5.1)
Sodium: 139 mmol/L (ref 135–145)

## 2015-10-01 LAB — MAGNESIUM: Magnesium: 1.7 mg/dL (ref 1.7–2.4)

## 2015-10-01 SURGERY — REPAIR, HERNIA, INCISIONAL
Anesthesia: General | Site: Abdomen

## 2015-10-01 MED ORDER — FUROSEMIDE 20 MG PO TABS
20.0000 mg | ORAL_TABLET | Freq: Every morning | ORAL | Status: DC
  Administered 2015-10-02 – 2015-10-05 (×4): 20 mg via ORAL
  Filled 2015-10-01 (×4): qty 1

## 2015-10-01 MED ORDER — SUCCINYLCHOLINE CHLORIDE 20 MG/ML IJ SOLN
INTRAMUSCULAR | Status: AC
  Filled 2015-10-01: qty 1

## 2015-10-01 MED ORDER — ESCITALOPRAM OXALATE 10 MG PO TABS
10.0000 mg | ORAL_TABLET | Freq: Every day | ORAL | Status: DC
  Administered 2015-10-01 – 2015-10-04 (×4): 10 mg via ORAL
  Filled 2015-10-01 (×4): qty 1

## 2015-10-01 MED ORDER — HYDROMORPHONE HCL 1 MG/ML IJ SOLN
2.0000 mg | INTRAMUSCULAR | Status: DC | PRN
  Administered 2015-10-01: 2 mg via INTRAVENOUS
  Filled 2015-10-01: qty 2

## 2015-10-01 MED ORDER — LACTATED RINGERS IV SOLN: INTRAVENOUS | Status: DC

## 2015-10-01 MED ORDER — ALBUTEROL SULFATE (2.5 MG/3ML) 0.083% IN NEBU
2.5000 mg | INHALATION_SOLUTION | Freq: Once | RESPIRATORY_TRACT | Status: AC
  Administered 2015-10-01: 2.5 mg via RESPIRATORY_TRACT

## 2015-10-01 MED ORDER — NEOSTIGMINE METHYLSULFATE 10 MG/10ML IV SOLN: INTRAVENOUS | Status: DC | PRN

## 2015-10-01 MED ORDER — FENTANYL CITRATE (PF) 250 MCG/5ML IJ SOLN
INTRAMUSCULAR | Status: AC
  Filled 2015-10-01: qty 5

## 2015-10-01 MED ORDER — DIPHENHYDRAMINE-APAP (SLEEP) 25-500 MG PO TABS: 2.0000 | ORAL_TABLET | Freq: Every day | ORAL | Status: DC | PRN

## 2015-10-01 MED ORDER — FUROSEMIDE 10 MG/ML IJ SOLN
INTRAMUSCULAR | Status: AC
  Administered 2015-10-01: 40 mg
  Filled 2015-10-01: qty 4

## 2015-10-01 MED ORDER — ONDANSETRON 4 MG PO TBDP
4.0000 mg | ORAL_TABLET | Freq: Four times a day (QID) | ORAL | Status: DC | PRN
  Filled 2015-10-01: qty 1

## 2015-10-01 MED ORDER — ALBUTEROL SULFATE (2.5 MG/3ML) 0.083% IN NEBU
INHALATION_SOLUTION | RESPIRATORY_TRACT | Status: AC
  Filled 2015-10-01: qty 3

## 2015-10-01 MED ORDER — FENTANYL CITRATE (PF) 100 MCG/2ML IJ SOLN
INTRAMUSCULAR | Status: DC | PRN
  Administered 2015-10-01: 50 ug via INTRAVENOUS
  Administered 2015-10-01: 100 ug via INTRAVENOUS
  Administered 2015-10-01 (×2): 50 ug via INTRAVENOUS
  Administered 2015-10-01 (×2): 25 ug via INTRAVENOUS

## 2015-10-01 MED ORDER — PHENYLEPHRINE 40 MCG/ML (10ML) SYRINGE FOR IV PUSH (FOR BLOOD PRESSURE SUPPORT)
PREFILLED_SYRINGE | INTRAVENOUS | Status: AC
  Filled 2015-10-01: qty 10

## 2015-10-01 MED ORDER — MOMETASONE FURO-FORMOTEROL FUM 200-5 MCG/ACT IN AERO
2.0000 | INHALATION_SPRAY | Freq: Two times a day (BID) | RESPIRATORY_TRACT | Status: DC
  Filled 2015-10-01: qty 8.8

## 2015-10-01 MED ORDER — ENOXAPARIN SODIUM 40 MG/0.4ML ~~LOC~~ SOLN
40.0000 mg | SUBCUTANEOUS | Status: DC
  Administered 2015-10-01 – 2015-10-02 (×2): 40 mg via SUBCUTANEOUS
  Filled 2015-10-01 (×2): qty 0.4

## 2015-10-01 MED ORDER — EPHEDRINE SULFATE 50 MG/ML IJ SOLN
INTRAMUSCULAR | Status: AC
  Filled 2015-10-01: qty 1

## 2015-10-01 MED ORDER — DEXTROSE-NACL 5-0.9 % IV SOLN
INTRAVENOUS | Status: DC
  Administered 2015-10-01: 12:00:00 via INTRAVENOUS

## 2015-10-01 MED ORDER — MIDAZOLAM HCL 2 MG/2ML IJ SOLN
INTRAMUSCULAR | Status: AC
  Filled 2015-10-01: qty 2

## 2015-10-01 MED ORDER — DEXAMETHASONE SODIUM PHOSPHATE 4 MG/ML IJ SOLN
INTRAMUSCULAR | Status: DC | PRN
  Administered 2015-10-01: 4 mg via INTRAVENOUS

## 2015-10-01 MED ORDER — DIPHENHYDRAMINE HCL 12.5 MG/5ML PO ELIX
12.5000 mg | ORAL_SOLUTION | Freq: Four times a day (QID) | ORAL | Status: DC | PRN
  Filled 2015-10-01: qty 5

## 2015-10-01 MED ORDER — SODIUM CHLORIDE 0.9 % IV SOLN
INTRAVENOUS | Status: DC | PRN
  Administered 2015-10-01: 08:00:00 via INTRAVENOUS

## 2015-10-01 MED ORDER — DIPHENHYDRAMINE HCL 50 MG/ML IJ SOLN: 12.5000 mg | Freq: Four times a day (QID) | INTRAMUSCULAR | Status: DC | PRN

## 2015-10-01 MED ORDER — LEVALBUTEROL HCL 0.63 MG/3ML IN NEBU: 0.6300 mg | INHALATION_SOLUTION | RESPIRATORY_TRACT | Status: DC | PRN

## 2015-10-01 MED ORDER — ACETAMINOPHEN 10 MG/ML IV SOLN
INTRAVENOUS | Status: DC | PRN
  Administered 2015-10-01: 1000 mg via INTRAVENOUS

## 2015-10-01 MED ORDER — ROCURONIUM BROMIDE 50 MG/5ML IV SOLN
INTRAVENOUS | Status: AC
  Filled 2015-10-01: qty 1

## 2015-10-01 MED ORDER — NEOSTIGMINE METHYLSULFATE 10 MG/10ML IV SOLN
INTRAVENOUS | Status: DC | PRN
Start: 2015-10-01 — End: 2015-10-01
  Administered 2015-10-01: 4 mg via INTRAMUSCULAR

## 2015-10-01 MED ORDER — MIDAZOLAM HCL 5 MG/5ML IJ SOLN
INTRAMUSCULAR | Status: DC | PRN
  Administered 2015-10-01: 1 mg via INTRAVENOUS

## 2015-10-01 MED ORDER — PHENYLEPHRINE HCL 10 MG/ML IJ SOLN
INTRAMUSCULAR | Status: DC | PRN
  Administered 2015-10-01: 80 ug via INTRAVENOUS
  Administered 2015-10-01 (×2): 40 ug via INTRAVENOUS
  Administered 2015-10-01: 80 ug via INTRAVENOUS

## 2015-10-01 MED ORDER — DILTIAZEM HCL 100 MG IV SOLR
5.0000 mg/h | INTRAVENOUS | Status: DC
  Administered 2015-10-01 – 2015-10-02 (×4): 10 mg/h via INTRAVENOUS
  Administered 2015-10-03 (×3): 12.5 mg/h via INTRAVENOUS
  Administered 2015-10-04: 10 mg/h via INTRAVENOUS
  Administered 2015-10-04: 12.5 mg/h via INTRAVENOUS
  Filled 2015-10-01 (×8): qty 100

## 2015-10-01 MED ORDER — ROCURONIUM BROMIDE 100 MG/10ML IV SOLN
INTRAVENOUS | Status: DC | PRN
  Administered 2015-10-01 (×3): 5 mg via INTRAVENOUS
  Administered 2015-10-01: 50 mg via INTRAVENOUS

## 2015-10-01 MED ORDER — OXYCODONE HCL 5 MG PO TABS
5.0000 mg | ORAL_TABLET | ORAL | Status: DC | PRN
  Administered 2015-10-01 – 2015-10-03 (×3): 5 mg via ORAL
  Administered 2015-10-04: 10 mg via ORAL
  Filled 2015-10-01 (×3): qty 1
  Filled 2015-10-01: qty 2

## 2015-10-01 MED ORDER — HYDROMORPHONE HCL 1 MG/ML IJ SOLN
0.5000 mg | INTRAMUSCULAR | Status: DC | PRN
  Administered 2015-10-02 – 2015-10-03 (×4): 0.5 mg via INTRAVENOUS
  Filled 2015-10-01 (×5): qty 1

## 2015-10-01 MED ORDER — HYDROMORPHONE HCL 1 MG/ML IJ SOLN
0.2500 mg | INTRAMUSCULAR | Status: DC | PRN
  Administered 2015-10-01: 0.25 mg via INTRAVENOUS
  Administered 2015-10-01: 0.5 mg via INTRAVENOUS
  Administered 2015-10-01: 0.25 mg via INTRAVENOUS

## 2015-10-01 MED ORDER — ONDANSETRON HCL 4 MG/2ML IJ SOLN
4.0000 mg | Freq: Four times a day (QID) | INTRAMUSCULAR | Status: DC | PRN
  Administered 2015-10-01 – 2015-10-02 (×2): 4 mg via INTRAVENOUS
  Filled 2015-10-01 (×2): qty 2

## 2015-10-01 MED ORDER — METOPROLOL TARTRATE 1 MG/ML IV SOLN
5.0000 mg | Freq: Once | INTRAVENOUS | Status: AC
  Administered 2015-10-01: 5 mg via INTRAVENOUS
  Filled 2015-10-01: qty 5

## 2015-10-01 MED ORDER — LEVALBUTEROL HCL 0.63 MG/3ML IN NEBU
0.6300 mg | INHALATION_SOLUTION | Freq: Four times a day (QID) | RESPIRATORY_TRACT | Status: DC
  Administered 2015-10-01: 0.63 mg via RESPIRATORY_TRACT
  Filled 2015-10-01: qty 3

## 2015-10-01 MED ORDER — LEVALBUTEROL HCL 0.63 MG/3ML IN NEBU
0.6300 mg | INHALATION_SOLUTION | Freq: Three times a day (TID) | RESPIRATORY_TRACT | Status: DC
  Administered 2015-10-02: 0.63 mg via RESPIRATORY_TRACT
  Filled 2015-10-01: qty 3

## 2015-10-01 MED ORDER — GLYCOPYRROLATE 0.2 MG/ML IJ SOLN
INTRAMUSCULAR | Status: DC | PRN
  Administered 2015-10-01: 0.6 mg via INTRAVENOUS

## 2015-10-01 MED ORDER — PANTOPRAZOLE SODIUM 40 MG IV SOLR
40.0000 mg | INTRAVENOUS | Status: DC
  Administered 2015-10-01 – 2015-10-03 (×3): 40 mg via INTRAVENOUS
  Filled 2015-10-01 (×3): qty 40

## 2015-10-01 MED ORDER — LIDOCAINE HCL (CARDIAC) 20 MG/ML IV SOLN
INTRAVENOUS | Status: DC | PRN
  Administered 2015-10-01: 60 mg via INTRAVENOUS

## 2015-10-01 MED ORDER — SODIUM CHLORIDE 0.9 % IJ SOLN
INTRAMUSCULAR | Status: AC
  Filled 2015-10-01: qty 10

## 2015-10-01 MED ORDER — PROPOFOL 10 MG/ML IV BOLUS
INTRAVENOUS | Status: AC
  Filled 2015-10-01: qty 60

## 2015-10-01 MED ORDER — METOPROLOL TARTRATE 1 MG/ML IV SOLN
5.0000 mg | Freq: Once | INTRAVENOUS | Status: DC
  Administered 2015-10-01: 5 mg via INTRAVENOUS

## 2015-10-01 MED ORDER — IPRATROPIUM BROMIDE 0.02 % IN SOLN
0.5000 mg | Freq: Three times a day (TID) | RESPIRATORY_TRACT | Status: DC
  Administered 2015-10-02: 0.5 mg via RESPIRATORY_TRACT
  Filled 2015-10-01: qty 2.5

## 2015-10-01 MED ORDER — METOPROLOL TARTRATE 1 MG/ML IV SOLN
INTRAVENOUS | Status: AC
  Filled 2015-10-01: qty 5

## 2015-10-01 MED ORDER — LEVALBUTEROL HCL 0.63 MG/3ML IN NEBU
0.6300 mg | INHALATION_SOLUTION | Freq: Four times a day (QID) | RESPIRATORY_TRACT | Status: DC
Start: 2015-10-01 — End: 2015-10-01

## 2015-10-01 MED ORDER — TIOTROPIUM BROMIDE MONOHYDRATE 18 MCG IN CAPS
18.0000 ug | ORAL_CAPSULE | Freq: Every morning | RESPIRATORY_TRACT | Status: DC
  Filled 2015-10-01: qty 5

## 2015-10-01 MED ORDER — IPRATROPIUM BROMIDE 0.02 % IN SOLN: 0.5000 mg | Freq: Four times a day (QID) | RESPIRATORY_TRACT | Status: DC

## 2015-10-01 MED ORDER — ONDANSETRON HCL 4 MG/2ML IJ SOLN
INTRAMUSCULAR | Status: DC | PRN
  Administered 2015-10-01: 4 mg via INTRAVENOUS

## 2015-10-01 MED ORDER — LEVALBUTEROL HCL 0.63 MG/3ML IN NEBU: 0.6300 mg | INHALATION_SOLUTION | Freq: Four times a day (QID) | RESPIRATORY_TRACT | Status: DC

## 2015-10-01 MED ORDER — 0.9 % SODIUM CHLORIDE (POUR BTL) OPTIME
TOPICAL | Status: DC | PRN
  Administered 2015-10-01: 1000 mL

## 2015-10-01 MED ORDER — IPRATROPIUM BROMIDE 0.02 % IN SOLN
0.5000 mg | Freq: Four times a day (QID) | RESPIRATORY_TRACT | Status: DC
  Administered 2015-10-01: 0.5 mg via RESPIRATORY_TRACT
  Filled 2015-10-01: qty 2.5

## 2015-10-01 MED ORDER — ACETAMINOPHEN 10 MG/ML IV SOLN
INTRAVENOUS | Status: AC
  Filled 2015-10-01: qty 100

## 2015-10-01 MED ORDER — DEXAMETHASONE SODIUM PHOSPHATE 4 MG/ML IJ SOLN
INTRAMUSCULAR | Status: AC
  Filled 2015-10-01: qty 2

## 2015-10-01 MED ORDER — PROPOFOL 10 MG/ML IV BOLUS
INTRAVENOUS | Status: DC | PRN
  Administered 2015-10-01 (×2): 20 mg via INTRAVENOUS
  Administered 2015-10-01: 150 mg via INTRAVENOUS

## 2015-10-01 MED ORDER — EVICEL 5 ML EX KIT
PACK | CUTANEOUS | Status: AC
  Filled 2015-10-01: qty 1

## 2015-10-01 MED ORDER — NEOSTIGMINE METHYLSULFATE 10 MG/10ML IV SOLN
INTRAVENOUS | Status: AC
  Filled 2015-10-01: qty 1

## 2015-10-01 MED ORDER — LACTATED RINGERS IV SOLN
INTRAVENOUS | Status: DC | PRN
  Administered 2015-10-01: 07:00:00 via INTRAVENOUS

## 2015-10-01 MED ORDER — LIDOCAINE HCL (CARDIAC) 20 MG/ML IV SOLN
INTRAVENOUS | Status: AC
  Filled 2015-10-01: qty 5

## 2015-10-01 MED ORDER — DOCUSATE SODIUM 100 MG PO CAPS
100.0000 mg | ORAL_CAPSULE | Freq: Two times a day (BID) | ORAL | Status: DC
  Administered 2015-10-01 – 2015-10-05 (×9): 100 mg via ORAL
  Filled 2015-10-01 (×9): qty 1

## 2015-10-01 MED ORDER — CHLORHEXIDINE GLUCONATE 4 % EX LIQD: 1.0000 "application " | Freq: Once | CUTANEOUS | Status: DC

## 2015-10-01 MED ORDER — NALOXONE HCL 0.4 MG/ML IJ SOLN
INTRAMUSCULAR | Status: AC
  Administered 2015-10-01: 0.4 mg
  Filled 2015-10-01: qty 1

## 2015-10-01 MED ORDER — METOPROLOL TARTRATE 100 MG PO TABS
100.0000 mg | ORAL_TABLET | Freq: Two times a day (BID) | ORAL | Status: DC
  Administered 2015-10-01 – 2015-10-05 (×8): 100 mg via ORAL
  Filled 2015-10-01 (×8): qty 1

## 2015-10-01 MED ORDER — GLYCOPYRROLATE 0.2 MG/ML IJ SOLN
INTRAMUSCULAR | Status: AC
  Filled 2015-10-01: qty 2

## 2015-10-01 MED ORDER — ALBUTEROL SULFATE (2.5 MG/3ML) 0.083% IN NEBU
2.5000 mg | INHALATION_SOLUTION | Freq: Four times a day (QID) | RESPIRATORY_TRACT | Status: DC | PRN
  Administered 2015-10-01: 2.5 mg via RESPIRATORY_TRACT
  Filled 2015-10-01: qty 3

## 2015-10-01 MED ORDER — EVICEL 5 ML EX KIT
PACK | CUTANEOUS | Status: DC | PRN
  Administered 2015-10-01: 1

## 2015-10-01 MED ORDER — HYDROMORPHONE HCL 1 MG/ML IJ SOLN
INTRAMUSCULAR | Status: AC
  Filled 2015-10-01: qty 1

## 2015-10-01 SURGICAL SUPPLY — 54 items
BENZOIN TINCTURE PRP APPL 2/3 (GAUZE/BANDAGES/DRESSINGS) ×3 IMPLANT
BINDER ABD UNIV 12 45-62 (WOUND CARE) ×1 IMPLANT
BINDER ABDOMINAL 46IN 62IN (WOUND CARE) ×3
BLADE SURG 11 STRL SS (BLADE) ×3 IMPLANT
BLADE SURG ROTATE 9660 (MISCELLANEOUS) IMPLANT
CLOSURE WOUND 1/2 X4 (GAUZE/BANDAGES/DRESSINGS) ×1
COVER SURGICAL LIGHT HANDLE (MISCELLANEOUS) ×3 IMPLANT
DEVICE TROCAR PUNCTURE CLOSURE (ENDOMECHANICALS) ×3 IMPLANT
DRAIN CHANNEL 19F RND (DRAIN) IMPLANT
DRAPE INCISE IOBAN 66X45 STRL (DRAPES) ×3 IMPLANT
DRAPE LAPAROSCOPIC ABDOMINAL (DRAPES) ×3 IMPLANT
ELECT CAUTERY BLADE 6.4 (BLADE) ×3 IMPLANT
ELECT REM PT RETURN 9FT ADLT (ELECTROSURGICAL) ×3
ELECTRODE REM PT RTRN 9FT ADLT (ELECTROSURGICAL) ×1 IMPLANT
EVACUATOR SILICONE 100CC (DRAIN) IMPLANT
GAUZE SPONGE 4X4 12PLY STRL (GAUZE/BANDAGES/DRESSINGS) ×3 IMPLANT
GLOVE BIO SURGEON STRL SZ 6.5 (GLOVE) ×2 IMPLANT
GLOVE BIO SURGEON STRL SZ7 (GLOVE) ×3 IMPLANT
GLOVE BIO SURGEON STRL SZ7.5 (GLOVE) ×3 IMPLANT
GLOVE BIO SURGEONS STRL SZ 6.5 (GLOVE) ×1
GLOVE BIOGEL PI IND STRL 7.0 (GLOVE) ×3 IMPLANT
GLOVE BIOGEL PI IND STRL 8 (GLOVE) ×1 IMPLANT
GLOVE BIOGEL PI INDICATOR 7.0 (GLOVE) ×6
GLOVE BIOGEL PI INDICATOR 8 (GLOVE) ×2
GLOVE SURG SS PI 7.0 STRL IVOR (GLOVE) ×3 IMPLANT
GOWN STRL REUS W/ TWL LRG LVL3 (GOWN DISPOSABLE) ×2 IMPLANT
GOWN STRL REUS W/ TWL XL LVL3 (GOWN DISPOSABLE) ×1 IMPLANT
GOWN STRL REUS W/TWL LRG LVL3 (GOWN DISPOSABLE) ×4
GOWN STRL REUS W/TWL XL LVL3 (GOWN DISPOSABLE) ×2
KIT BASIN OR (CUSTOM PROCEDURE TRAY) ×3 IMPLANT
KIT ROOM TURNOVER OR (KITS) ×3 IMPLANT
LIQUID BAND (GAUZE/BANDAGES/DRESSINGS) ×3 IMPLANT
MESH ULTRAPRO 6X6 15CM15CM (Mesh General) ×3 IMPLANT
NS IRRIG 1000ML POUR BTL (IV SOLUTION) ×3 IMPLANT
PACK GENERAL/GYN (CUSTOM PROCEDURE TRAY) ×3 IMPLANT
PAD ARMBOARD 7.5X6 YLW CONV (MISCELLANEOUS) ×3 IMPLANT
PEN SKIN MARKING BROAD (MISCELLANEOUS) ×3 IMPLANT
STAPLER VISISTAT 35W (STAPLE) IMPLANT
STRIP CLOSURE SKIN 1/2X4 (GAUZE/BANDAGES/DRESSINGS) ×2 IMPLANT
SUT ETHILON 3 0 FSL (SUTURE) IMPLANT
SUT MNCRL AB 3-0 PS2 18 (SUTURE) ×6 IMPLANT
SUT NOVA NAB GS-21 0 18 T12 DT (SUTURE) IMPLANT
SUT PDS AB 0 CT 36 (SUTURE) IMPLANT
SUT PDS AB 1 CT  36 (SUTURE) ×8
SUT PDS AB 1 CT 36 (SUTURE) ×4 IMPLANT
SUT PDS AB 2-0 CT1 27 (SUTURE) ×6 IMPLANT
SUT PROLENE 2 0 CT2 30 (SUTURE) ×18 IMPLANT
SUT SILK 2 0 (SUTURE) ×2
SUT SILK 2-0 18XBRD TIE 12 (SUTURE) ×1 IMPLANT
SUT VIC AB 3-0 SH 18 (SUTURE) ×3 IMPLANT
SUT VICRYL AB 2 0 TIES (SUTURE) ×3 IMPLANT
TOWEL OR 17X24 6PK STRL BLUE (TOWEL DISPOSABLE) IMPLANT
TOWEL OR 17X26 10 PK STRL BLUE (TOWEL DISPOSABLE) ×3 IMPLANT
TRAY FOLEY CATH 16FR SILVER (SET/KITS/TRAYS/PACK) ×3 IMPLANT

## 2015-10-01 NOTE — H&P (View-Only) (Signed)
History of Present Illness Allison Ok MD; 06/12/2015 2:24 PM) Patient words: hernia.  The patient is a 65 year old female who presents with an incisional hernia. Patient is a 65 year old female who is referred by Dr. Tresa Moore for evaluation of an incisional hernia. Patient had a laparoscopic-assisted nephrectomy approximately 1 year ago. This was due to renal cell carcinoma. Subsequent to this the patient developed a incisional hernia. Patient's CT scan revealed approximate 6 cm incisional hernia with incarcerated fat. Patient states that she's had no further complications since her last visit. Patient is currently seeing Dr. Julianne Handler is to undergo a stress test in the next several weeks. He will clear her after any further evaluation the patient is currently on Mel[place] and will need to be off this for 2-3 days prior to surgery.  Patient does have a history of COPD and sees Dr. Chase Caller is her pulmonologist Patient also sees Dr. Julianne Handler as her cardiologist secondary to A. fib, Elliquis, and heart stent.     Allergies Malachi Bonds, CMA; 06/12/2015 2:01 PM) Amoxicillin ER *PENICILLINS* Codeine Sulfate *ANALGESICS - OPIOID*  Medication History (Chemira Jones, CMA; 06/12/2015 2:01 PM) Metoprolol Tartrate (50MG  Tablet, Oral) Active. Furosemide (20MG  Tablet, Oral) Active. Albuterol Sulfate HFA (108 (90 Base)MCG/ACT Aerosol Soln, Inhalation) Active. Dulera (200-5MCG/ACT Aerosol, Inhalation) Active. Lexapro (20MG  Tablet, Oral) Active. Potassium Chloride (10MEQ Tablet ER, Oral) Active. Zocor (20MG  Tablet, Oral) Active. Spiriva HandiHaler (18MCG Capsule, Inhalation) Active. Medications Reconciled    Review of Systems Allison Ok, MD; 06/12/2015 2:26 PM) General Not Present- Appetite Loss, Chills, Fatigue, Fever, Night Sweats, Weight Gain and Weight Loss. Skin Not Present- Change in Wart/Mole, Dryness, Hives, Jaundice, New Lesions, Non-Healing Wounds, Rash and  Ulcer. HEENT Present- Wears glasses/contact lenses. Not Present- Earache, Hearing Loss, Hoarseness, Nose Bleed, Oral Ulcers, Ringing in the Ears, Seasonal Allergies, Sinus Pain, Sore Throat, Visual Disturbances and Yellow Eyes. Respiratory Present- Difficulty Breathing and Wheezing. Not Present- Bloody sputum, Chronic Cough and Snoring. Cardiovascular Not Present- Chest Pain, Difficulty Breathing Lying Down, Leg Cramps, Palpitations, Rapid Heart Rate, Shortness of Breath and Swelling of Extremities. Gastrointestinal Not Present- Abdominal Pain, Bloating, Bloody Stool, Change in Bowel Habits, Chronic diarrhea, Constipation, Difficulty Swallowing, Excessive gas, Gets full quickly at meals, Hemorrhoids, Indigestion, Nausea, Rectal Pain and Vomiting. Female Genitourinary Not Present- Frequency, Nocturia, Painful Urination, Pelvic Pain and Urgency. Musculoskeletal Not Present- Back Pain, Joint Pain, Joint Stiffness, Muscle Pain, Muscle Weakness and Swelling of Extremities. Neurological Not Present- Decreased Memory, Fainting, Headaches, Numbness, Seizures, Tingling, Tremor, Trouble walking and Weakness. Psychiatric Present- Anxiety. Not Present- Bipolar, Change in Sleep Pattern, Depression, Fearful and Frequent crying. Endocrine Not Present- Cold Intolerance, Excessive Hunger, Hair Changes, Heat Intolerance, Hot flashes and New Diabetes. Hematology Not Present- Easy Bruising, Excessive bleeding, Gland problems, HIV and Persistent Infections.  Vitals (Chemira Jones CMA; 06/12/2015 2:01 PM) 06/12/2015 2:01 PM Weight: 197.4 lb Height: 67in Body Surface Area: 2.01 m Body Mass Index: 30.92 kg/m  Temp.: 97.5F(Oral)  Pulse: 82 (Regular)  BP: 134/68 (Sitting, Left Arm, Standard)       Physical Exam Allison Ok MD; 06/12/2015 2:24 PM) General Mental Status-Alert. General Appearance-Consistent with stated age. Hydration-Well hydrated. Voice-Normal.  Head and  Neck Head-normocephalic, atraumatic with no lesions or palpable masses. Trachea-midline. Thyroid Gland Characteristics - normal size and consistency.  Chest and Lung Exam Chest and lung exam reveals -quiet, even and easy respiratory effort with no use of accessory muscles and on auscultation, normal breath sounds, no adventitious sounds and normal vocal resonance. Inspection  Chest Wall - Normal. Back - normal.  Cardiovascular Cardiovascular examination reveals -normal heart sounds, regular rate and rhythm with no murmurs and normal pedal pulses bilaterally.  Abdomen Inspection Skin - Scar - no surgical scars. Hernias - Incisional - Reducible(incarcerated incisional hernia ~6cm). Palpation/Percussion Normal exam - Soft, Non Tender, No Rebound tenderness, No Rigidity (guarding) and No hepatosplenomegaly. Auscultation Normal exam - Bowel sounds normal.    Assessment & Plan Allison Ok MD; 06/12/2015 2:26 PM) INCISIONAL HERNIA, WITHOUT OBSTRUCTION OR GANGRENE (K43.2) Impression: 65 year old female with an incisional hernia secondary to laparoscopic assisted left nephrectomy.  1. We will proceed to the operating for open incisional hernia repair with mesh after clearance by Dr. Chase Caller and Dr.McAlhaney . 3. I discussed with her the risks and benefits of the procedure to include but not limited to: Infection, bleeding, damage to structures, possible recurrence. The patient was understanding and wished to proceed. Current Plans

## 2015-10-01 NOTE — Progress Notes (Signed)
Pt arrived from [place] per bed with Bipap on.

## 2015-10-01 NOTE — Progress Notes (Signed)
Patient transported from 6N to room 2C14 without any apparent complications.

## 2015-10-01 NOTE — Anesthesia Preprocedure Evaluation (Addendum)
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 on home beta blockers and Pt. on medications + CAD, + Past MI, + Cardiac Stents and + Peripheral Vascular Disease  Normal cardiovascular exam+ dysrhythmias Atrial Fibrillation  Rhythm:regular Rate:Normal  Ischemic cardiomyopathy EF 40%. ECG AF   Neuro/Psych negative neurological ROS  negative psych ROS   GI/Hepatic negative GI ROS, Neg liver ROS,   Endo/Other  negative endocrine ROS  Renal/GU Renal diseaseRenal cell carcinoma  negative genitourinary   Musculoskeletal   Abdominal   Peds  Hematology negative hematology ROS (+)   Anesthesia Other Findings   Reproductive/Obstetrics negative OB ROS                            Anesthesia Physical Anesthesia Plan  ASA: III  Anesthesia Plan: General   Post-op Pain Management:    Induction: Intravenous  Airway Management Planned: Oral ETT  Additional Equipment:   Intra-op Plan:   Post-operative Plan: Extubation in OR  Informed Consent: I have reviewed the patients History and Physical, chart, labs and discussed the procedure including the risks, benefits and alternatives for the proposed anesthesia with the patient or authorized representative who has indicated his/her understanding and acceptance.   Dental Advisory Given  Plan Discussed with:  CRNA  Anesthesia Plan Comments:         Anesthesia Quick Evaluation

## 2015-10-01 NOTE — Progress Notes (Signed)
10/01/2015 Patient transfer from 6N to [place] when arrive on unit she was alert, oriented and on the bipap. Patient was in [place] before arrive on unit. Patient heart rate when arrive on unit was 140's to 150's. She was given Lopressor 5mg  at 1755. She have two scab are on right arm she stated it was a wart. Patient was given a CHG bath and was place on telemetry. Critical care came by and patient heart rate 150's she was given a another dose of Lopressor 5mg  at 1854. Elink was called patient heart rate continue to be in the 140's to 150's she was placed on  Cardizem drip. [facility] RN.

## 2015-10-01 NOTE — Progress Notes (Signed)
eLink Physician-Brief Progress Note Patient Name: Allison [name] DOB: 05/30/1951 MRN: [id number]   Date of Service  10/01/2015  HPI/Events of Note  AFIB with RVR. HR = 140's. Hx of AFIB. BP = 139/99.  eICU Interventions  Will order: 1. Cardizem IV infusion. 2. BMP and Mg++ level now.      Intervention Category Major Interventions: Arrhythmia - evaluation and management  Clayborn Milnes Cornelia Copa 10/01/2015, 7:18 PM

## 2015-10-01 NOTE — Progress Notes (Addendum)
Post Op check  Walked into room with pt's room.  Pt somnolent and foaming at the mouth. Alerted RN.  Narcan given.  Pt awaken and SOB.  Initial O2 Sat was 77%.  [place] placed.  FM eventually placed. RT for breathing treatment  CXR pending.   Addendum: Will transfer to SDU and consult Pulm to eval.

## 2015-10-01 NOTE — Op Note (Signed)
10/01/2015  9:27 AM  PATIENT:  Allison [name]  65 y.o. female  PRE-OPERATIVE DIAGNOSIS:  INCISIONAL HERNIA  POST-OPERATIVE DIAGNOSIS:  6cm INCARCERATED INCISIONAL HERNIA  PROCEDURE:  Procedure(s): INCISIONAL HERNIA REPAIR with POSTERIOR MUSCLE ADVANCEMENT FLAPS BILATERALY (N/A) INSERTION OF MESH (N/A)  SURGEON:  Surgeon(s) and Role:    * Ralene Ok, MD - Primary  ASSISTANTS: Jamie Sipsis, RNFA-S   ANESTHESIA:   general  EBL:  Total I/O In: -  Out: 23 [Urine:800; Blood:25]  BLOOD ADMINISTERED:none  DRAINS: none   LOCAL MEDICATIONS USED:  NONE  SPECIMEN:  No Specimen  DISPOSITION OF SPECIMEN:  N/A  COUNTS:  YES  TOURNIQUET:  * No tourniquets in log *  DICTATION: .Dragon Dictation Indication for procedure:   Patient is a 65 year old female status post robotic nephrectomy. Patient  was evaluated in clinic and was seen to have the supraumbilical incisional hernia. Patient was also scheduled for elective open incisional hernia repair with mesh.  Details of procedure: After the patient was consented she was taken back to the operating room and placed supine position with bilateral SCDs in place. She underwent general endotracheal anesthesia. Patient had a Foley catheter placed. Patient prepped and draped in the usual sterile fashion.  A timeout was called all facts verified.  The previous scar was excised sharply. Cautery was used to maintain hemostasis and dissection was taken down to the hernia sac. It appeared to be incarcerated with omentum. At this time the subcutaneous fat and fascia cephalad and caudad to the hernia were incised. This time we elected to the abdomen bluntly. The hernia sac was then entered. This was filled with incarcerated omentum. This was reduced easily. Once this was reduced and the sac was then bluntly dissected with some cutaneous tissue. Hernia sac was then excised.  There was minimal adhesions to the midline abdominal wall. At this time the  rectus muscle was retracted medially. The posterior fascia was dissected off the rectus muscle bilaterally. At this time the posterior sheath was reapproximated using a running #1 PDS. This area was irrigated out with sterile saline. At this time a piece of 15 x 15 cm ultra Pro mesh was brought onto the field. This was cut to approximately 10 x 15 cm. To proceed centimeters was the size of the posterior rectus sheath space. A slit was cut the mesh both cephalad and caudad secondary to the midline fascia. This was present over the posterior fascia. This was anchored using transfacial sutures was an Endo Close device using a 2-0 Prolene on 3 sides bilaterally. The mesh lay flat. There was no undue tension on the midline of the mesh. At this time Eviseal  Used to glue the mesh to the posterior fascia. The mesh lay flat.    At this time the anterior layer was reapproximated using#1 PDS 2 in a standard running fashion. Subcutaneous tissue was then irrigated out with sterile saline. This reapproximated using 3-0 Vicryl's in interrupted fashion to obliterate the empty space. At this time the skin was reapproximated using 3-0 Monocryl subcuticular fashion 2.   The midline skin was reapproximated using LiquiBand.  The bilateral stab wounds reapproximated using Steri-Strips gauze and tape. The patient tied the procedure well was taken to the recovery room stable condition.    PLAN OF CARE: Admit for overnight observation  PATIENT DISPOSITION:  PACU - hemodynamically stable.   Delay start of Pharmacological VTE agent (>24hrs) due to surgical blood loss or risk of bleeding: not applicable

## 2015-10-01 NOTE — Transfer of Care (Signed)
Immediate Anesthesia Transfer of Care Note  Patient: Allison [name]  Procedure(s) Performed: Procedure(s): [place] (N/A) INSERTION OF MESH (N/A)  Patient Location: PACU  Anesthesia Type:General  Level of Consciousness: awake, alert  and oriented  Airway & Oxygen Therapy: Patient Spontanous Breathing and Patient connected to face mask oxygen  Post-op Assessment: Report given to RN and Post -op Vital signs reviewed and stable  Post vital signs: Reviewed and stable  Last Vitals: There were no vitals filed for this visit.  Complications: No apparent anesthesia complications

## 2015-10-01 NOTE — Interval H&P Note (Signed)
History and Physical Interval Note:  10/01/2015 7:12 AM  Allison [name]  has presented today for surgery, with the diagnosis of Incisional hernia   The various methods of treatment have been discussed with the patient and family. After consideration of risks, benefits and other options for treatment, the patient has consented to  Procedure(s): OPEN HERNIA REPAIR INCISIONAL TAR VS RETRORECTUS WITH MESH  (N/A) INSERTION OF MESH (N/A) as a surgical intervention .  The patient's history has been reviewed, patient examined, no change in status, stable for surgery.  I have reviewed the patient's chart and labs.  Questions were answered to the patient's satisfaction.     Rosario Jacks., Anne Hahn

## 2015-10-01 NOTE — Progress Notes (Signed)
Pt had been given 2 doses of Metoprolol Iv without results. Pt in At fib RVR now rate up to 160's- 170's started on cardizem gtt at 5 than up to 10 mg/hr. BP auto set for BP every 15" as ordered per MD.

## 2015-10-01 NOTE — Significant Event (Signed)
Rapid Response Event Note  Overview:  Alerted by PP of patient with SDU orders - to bedside to triage for limited beds Time Called: [place] Time: 1630 Event Type: Respiratory, Cardiac  Initial Focused Assessment:  On arrival to bedside - Georgann Housekeeper NP with PCCM present at bedside - patient alert - in mild resp distress with NRB mask - RR 28 - O2 sats 94% - bil BS present with exp wheezing - coarse and few basilar rales.  Denies CP.   Alert without focal neuro signs.  Abd DDI - binder on post surgery- loosened PTA per staff.  Abd soft - tender from surgery today.  Patient on bedpan - unable to void - had IV Lasix few minutes ago.  HR fast and irregular - placed on monitor.  BP 153/104  HR 149.  According to staff patient was found somulent post narcotic dose - reversed with Narcan PTA RRT.   Interventions:  Placed on heart monitor = 12 lead EKG done.  Patient had nebulizer tx PTA RRT.  Call for BiPap per order NP.  Placed on Bipap per RT - 12/6 40% - patient tol well.  Decrased WOB.  HR remains afib with RVR - asx.  BP 156/89 HR 153 RR 22 O2 sats 98%.  Denies CP.  In and out cath - clear yellow urine.  Transferred to [place] without incident on Bipap- handoff to [employer] and [employer].     Event Summary: Name of Physician Notified: Dr. Rosendo Gros at  (pta RRT)  Name of Consulting Physician Notified: Georgann Housekeeper NP and Dr. Cammy Brochure at  (pta RRT by Dr. Rosendo Gros)  Outcome: Transferred (Comment) ([place])  Event End Time: [id number]  Quin Hoop

## 2015-10-01 NOTE — Progress Notes (Signed)
Pt. BBS Mildly diminished. Pt.has no distress. Pt. States she does not take breathing tx. At home. Pt. Assessed and tx. Scheduled altered. Pt. Does have a prn tx readily available if needed. No issues at this time.

## 2015-10-01 NOTE — Anesthesia Postprocedure Evaluation (Signed)
Anesthesia Post Note  Patient: Allison [name]  Procedure(s) Performed: Procedure(s) (LRB): INCISIONAL HERNIA REPAIR (N/A) INSERTION OF MESH (N/A)  Anesthesia Post Evaluation  Last Vitals:  Filed Vitals:   10/01/15 1030 10/01/15 1045  BP: 132/86   Pulse: 119 120  Temp:  36.9 C  Resp: 15 16    Last Pain:  Filed Vitals:   10/01/15 1102  PainSc: 2                  Darriona Dehaas L

## 2015-10-01 NOTE — Progress Notes (Signed)
Pt complaining of 8/10 pain @1400 .  2mg  Dilaudid given IV per order.  Went to check on pt at 1455 and pt was asleep with no signs of distress or apparent complications.  Called into room by MD @ 1530 and pt was somnolent and foaming at the mouth.  0.4mg  Narcan given IV and pt responded well, but was SOB, tachypnic and tachycardic.  O2 sats were 77% on 2L.  Pt put on nonrebreather and O2 sats came up to the 90's.  40mg  IV lasix given per MD order.  Pt put on bipap and transferred to SDU with no apparent complications.  Eliezer Bottom [place]

## 2015-10-01 NOTE — Consult Note (Signed)
PULMONARY / CRITICAL CARE MEDICINE   Name: Allison [name] MRN: [id number] DOB: Sep 24, 1950    ADMISSION DATE:  10/01/2015 CONSULTATION DATE:  10/01/2015  REFERRING MD:  Dr. Rosendo Gros CCS  CHIEF COMPLAINT:  SOB  HISTORY OF PRESENT ILLNESS:   65 year old female with PMH as below, which includes COPD (Followed by MR), CHF, CAD, Atrial fib, and HTN. She also has history of renal cell carcinoma s/p nephrectomy about 1 year ago. She developed an incisional hernia secondary to that procedure. She presented to [facility] 3/27 for repair of that hernia. The procedure was without complication. She was admitted for overnight observation. Later that day upon PM rounding Dr. Rosendo Gros noted her to be somnolent and foaming at the mouth. She responded well to IV narcan and was given a breathing treatement, however she remained markedly dyspneic and tachypneic. Hypoxia with O2 sats in 70s on RA. Also tachycardic to 160s, PCCM to see.   PAST MEDICAL HISTORY :  She  has a past medical history of Tobacco abuse; Coronary artery disease; Hyperlipidemia; Ischemic cardiomyopathy; A-fib ([place]); Metastatic renal cell carcinoma to lung ([place]) (01/29/2014); Acute pyelonephritis (01/28/2014); Hypertension; Pneumonia; Anxiety; History of kidney stones; Lung nodule; PVD (peripheral vascular disease) ([place]); Carotid disease, bilateral ([place]); Shortness of breath dyspnea; Dysrhythmia; and COPD (chronic obstructive pulmonary disease) ([place]).  PAST SURGICAL HISTORY: She  has past surgical history that includes Coronary stent placement (2011); Femoral bypass (Left, May 2012); Lower extremity angiogram (December 16, 2011); Cardiac catheterization; Tonsillectomy (65 years old); Robot assisted laparoscopic nephrectomy (Left, 03/22/2014); Cardioversion (N/A, 05/08/2014); and lower extremity angiogram (N/A, 12/16/2011).  Allergies  Allergen Reactions  . Ace Inhibitors Cough  . Amoxicillin-Pot Clavulanate Hives  . Codeine Nausea And Vomiting    No current  facility-administered medications on file prior to encounter.   Current Outpatient Prescriptions on File Prior to Encounter  Medication Sig  . albuterol (PROVENTIL HFA;VENTOLIN HFA) 108 (90 Base) MCG/ACT inhaler Inhale 2 puffs into the lungs every 6 (six) hours as needed for wheezing or shortness of breath (shortness of breath).  Marland Kitchen apixaban (ELIQUIS) 5 MG TABS tablet Take 1 tablet (5 mg total) by mouth 2 (two) times daily.  . diphenhydramine-acetaminophen (TYLENOL PM) 25-500 MG TABS Take 2 tablets by mouth daily as needed (for sleep).   . DULERA 200-5 MCG/ACT AERO 2 PUFFS FIRST THING IN AM AND THEN ANOTHER 2 PUFFS ABOUT 12 HOURS LATER  . escitalopram (LEXAPRO) 10 MG tablet Take 1 tablet (10 mg total) by mouth at bedtime.  . furosemide (LASIX) 20 MG tablet Take 1 tablet (20 mg total) by mouth every morning.  . metoprolol (LOPRESSOR) 50 MG tablet Take 100 mg(2 tablets) by mouth in the am and 75 mg(1 1/2 tablets) by mouth in the pm  . potassium chloride (K-DUR) 10 MEQ tablet Take 1 tablet (10 mEq total) by mouth daily.  . simvastatin (ZOCOR) 20 MG tablet Take 1 tablet (20 mg total) by mouth every evening.  . tiotropium (SPIRIVA) 18 MCG inhalation capsule Place 1 capsule (18 mcg total) into inhaler and inhale every morning.    FAMILY HISTORY:  Her indicated that her mother is deceased. She indicated that her father is deceased.   SOCIAL HISTORY: She  reports that she quit smoking about 6 years ago. Her smoking use included Cigarettes. She has a 45 pack-year smoking history. She has never used smokeless tobacco. She reports that she does not drink alcohol or use illicit drugs.  REVIEW OF SYSTEMS:   Bolds  are positive  Constitutional: weight loss, gain, night sweats, Fevers, chills, fatigue .  HEENT: headaches, Sore throat, sneezing, nasal congestion, post nasal drip, Difficulty swallowing, Tooth/dental problems, visual complaints visual changes, ear ache CV:  chest pain, radiates: ,Orthopnea,  PND, swelling in lower extremities, dizziness, palpitations, syncope.  GI  heartburn, indigestion, abdominal pain, nausea, vomiting, diarrhea, change in bowel habits, loss of appetite, bloody stools.  Resp: cough, productive: , hemoptysis, dyspnea, chest pain, pleuritic.  Skin: rash or itching or icterus GU: dysuria, change in color of urine, urgency or frequency. flank pain, hematuria  MS: joint pain or swelling. decreased range of motion  Psych: change in mood or affect. depression or anxiety.  Neuro: difficulty with speech, weakness, numbness, ataxia    SUBJECTIVE:   VITAL SIGNS: BP 128/85 mmHg  Pulse 101  Temp(Src) 98 F (36.7 C) (Oral)  Resp 16  Wt 91.627 kg (202 lb)  SpO2 94%  HEMODYNAMICS:    VENTILATOR SETTINGS: Vent Mode:  [-]  FiO2 (%):  [100 %] 100 %  INTAKE / OUTPUT:    PHYSICAL EXAMINATION: General:  Obese female in mild distress on NRB Neuro:  Alert, oriented, non-focal HEENT:  [place]/AT, PERRL, no JVD Cardiovascular:  Tachy, IRIR, no MRG Lungs:  Coarse end expiratory wheeze Abdomen:  Soft, non-tender, non-distended Musculoskeletal:  No acute deformity or ROM limitation Skin:  Grossly intact with exception of surgical incisions.   LABS:  BMET  Recent Labs Lab 09/26/15 1039  NA 142  K 4.9  CL 108  CO2 24  BUN 11  CREATININE 1.20*  GLUCOSE 110*    Electrolytes  Recent Labs Lab 09/26/15 1039  CALCIUM 9.9    CBC  Recent Labs Lab 09/26/15 1039  WBC 12.5*  HGB 14.1  HCT 43.1  PLT 313    Coag's No results for input(s): APTT, INR in the last 168 hours.  Sepsis Markers No results for input(s): LATICACIDVEN, PROCALCITON, O2SATVEN in the last 168 hours.  ABG No results for input(s): PHART, PCO2ART, PO2ART in the last 168 hours.  Liver Enzymes No results for input(s): AST, ALT, ALKPHOS, BILITOT, ALBUMIN in the last 168 hours.  Cardiac Enzymes No results for input(s): TROPONINI, PROBNP in the last 168 hours.  Glucose No results  for input(s): GLUCAP in the last 168 hours.  Imaging Dg Chest Port 1 View  10/01/2015  CLINICAL DATA:  Shortness of breath with low oxygen saturation. Abdominal hernia repair performed today. EXAM: PORTABLE CHEST 1 VIEW COMPARISON:  CT 06/06/2015.  Radiographs 05/10/2014) FINDINGS: 1559 hours. Two views were obtained. The heart size and mediastinal contours are stable. There is aortic atherosclerosis. Right lower lobe pulmonary nodule appears unchanged. Other linear opacities in both lungs are stable. There is no evidence of superimposed edema, confluent airspace opacity, pleural effusion or pneumothorax. No acute osseous findings are seen. IMPRESSION: Stable chronic lung disease with stable right lower lobe pulmonary nodule. No acute findings demonstrated. Electronically Signed   By: Richardean Sale M.D.   On: 10/01/2015 16:14     STUDIES:  n/a  CULTURES: none  ANTIBIOTICS: Vancomycin (surg ppx) 3/27 >  SIGNIFICANT EVENTS: 3/27 to OR for hernia repair, lethargy and desat post op  LINES/TUBES: PIV  DISCUSSION: 65 year old female with presetned for elective incisional hernia repair 3/27. Procedure was without complication. Post op was lethargic, likely due to IV opiates. Responded to narcan, but remained hypoxic and tachycardic. EKG showed AF RVR. Started on BiPAP with improvement. Will continue to monitor.  ASSESSMENT / PLAN:  PULMONARY A: Acute hypoxemic respiratory failure . ddx includes pulmonary edema, aspiration, PE COPD without acute exacerbation (on nocturnal O2) Pulmonary nodule on CXR (metastatic renal cell carcinoma to lung per hx)  P:   BiPAP PRN ABG Follow CXR Pulmonary hygiene Scheduled/PRN Nebs Defer CT angio chest for now  CARDIOVASCULAR A:  Atrial fibrillation with rapid ventricular response CHF (Recent echo [01/2015] with LVEF 55-60%, could not evaluate diastolic dysfunction) HTN  P:  Telemetry monitoring EKG now Hope AF with correct with BiPAP If  not will give 5mg  metoprolol IV Lasix 40mg  given by primary team Trend troponin  RENAL A:   AKI Urinary retention in post op setting  P:   KVO IVF Follow BMP In and out cath Strict I&O  GASTROINTESTINAL A:   S/p repair incisional hernia with incarcerated fat.  P:   NPO Protonix for SUP  HEMATOLOGIC A:   No acute issues  P:  Enoxaparin  subcutaneous for VTE ppx SCDs Follow CBC  INFECTIOUS A:   ? Aspiration pneumonitis  P:   Culture Defer ABX for now Trend PCT  ENDOCRINE A:   No acute issues  P:   Follow glucose on daily BMP  NEUROLOGIC A:   Acute encephalopathy secondary to opiate > improved with narcan.  Pain management   P:   RASS goal: 0 Careful use of narcotics especially as she is denying pain.  Consider narcan gtt if unable to remain awake  FAMILY  - Updates: patient updated 3/27  - Inter-disciplinary family meet or Palliative Care meeting due by:  4/5   Georgann Housekeeper, AGACNP-BC [place] [facility] Pager [phone] or [phone]  10/01/2015 5:19 PM       ATTENDING NOTE: I have personally reviewed patient's available data, including medical history, events of note, physical examination and test results as part of my evaluation. I have discussed with resident/NP Georgann Housekeeper.  65 y.o. year old female, admitted for elective hernia repair. Patient is known to have COPD, chronic A. fib, hypertension, denies any history of sleep apnea. Had elective surgery with hernia repair. Noted to be drowsy post intubation on the floors. He was also in rapid A. fib. Her sats were 70% on room air. Transferred to stepdown. Placed on BiPAP 40% and she is more awake and with it and comfortable. Still with a heart rate in the 140s to 150s, A. fib.   On exam, 110/70. Heart rate 140. Sats 99% on BiPAP. No neck vein distention. Awake, comfortable, not in distress. Some crackles at the bases. Tachycardic. Belly was soft. Warm  extremities.   Labs reviewed. Pertinent labs include the following: 7.34, PCO2 40, PO2 of 144 on BiPAP.   Imaging reviewed personally. Chest x-ray with no significant changes. No pulmonary edema.   Impression: Acute hypoxemic respiratory failure probably secondary to unable to protect airway, sedation postop, rapid atrial fibrillation, possible obstructive sleep apnea. I don't think she has flareup of her COPD. Patient with chronic paroxysmal atrial fibrillation.   Plan: Continue BiPAP for now. Need to control her heart rate. She got boluses of Lopressor without effect. Historically, she is controlled with Lopressor. We'll need to start Cardizem drip if the heart rate is not better. Cont other meds.  We will hold off on antibiotics for now.   Code status :Fiull code.   I have extensively discussed with the patient's family the goals of care and they are in agreement.    [place],  MD 10/01/2015,   7:39 PM [place] [facility] Pager [phone] After 3 pm or if no answer, call [phone]

## 2015-10-01 NOTE — Anesthesia Procedure Notes (Signed)
Procedure Name: Intubation Date/Time: 10/01/2015 7:50 AM Performed by: Merdis Delay Pre-anesthesia Checklist: Patient identified, Emergency Drugs available, Patient being monitored, Suction available and Timeout performed Patient Re-evaluated:Patient Re-evaluated prior to inductionOxygen Delivery Method: Circle system utilized Preoxygenation: Pre-oxygenation with 100% oxygen Intubation Type: IV induction Ventilation: Mask ventilation without difficulty Laryngoscope Size: Mac and 3 Grade View: Grade I Tube type: Oral Tube size: 7.5 mm Number of attempts: 1 Airway Equipment and Method: Stylet Placement Confirmation: ETT inserted through vocal cords under direct vision,  breath sounds checked- equal and bilateral,  positive ETCO2 and CO2 detector Tube secured with: Tape Dental Injury: Teeth and Oropharynx as per pre-operative assessment

## 2015-10-02 ENCOUNTER — Encounter (HOSPITAL_COMMUNITY): Payer: Self-pay | Admitting: General Surgery

## 2015-10-02 ENCOUNTER — Observation Stay (HOSPITAL_COMMUNITY): Payer: Medicaid Other

## 2015-10-02 DIAGNOSIS — G92 Toxic encephalopathy: Secondary | ICD-10-CM | POA: Diagnosis present

## 2015-10-02 DIAGNOSIS — J95821 Acute postprocedural respiratory failure: Secondary | ICD-10-CM | POA: Diagnosis present

## 2015-10-02 DIAGNOSIS — R339 Retention of urine, unspecified: Secondary | ICD-10-CM | POA: Diagnosis present

## 2015-10-02 DIAGNOSIS — Z79899 Other long term (current) drug therapy: Secondary | ICD-10-CM | POA: Diagnosis not present

## 2015-10-02 DIAGNOSIS — K430 Incisional hernia with obstruction, without gangrene: Secondary | ICD-10-CM | POA: Diagnosis present

## 2015-10-02 DIAGNOSIS — T402X5A Adverse effect of other opioids, initial encounter: Secondary | ICD-10-CM | POA: Diagnosis present

## 2015-10-02 DIAGNOSIS — Z885 Allergy status to narcotic agent status: Secondary | ICD-10-CM | POA: Diagnosis not present

## 2015-10-02 DIAGNOSIS — Z6831 Body mass index (BMI) 31.0-31.9, adult: Secondary | ICD-10-CM | POA: Diagnosis not present

## 2015-10-02 DIAGNOSIS — E669 Obesity, unspecified: Secondary | ICD-10-CM | POA: Diagnosis present

## 2015-10-02 DIAGNOSIS — I255 Ischemic cardiomyopathy: Secondary | ICD-10-CM | POA: Diagnosis present

## 2015-10-02 DIAGNOSIS — Z85528 Personal history of other malignant neoplasm of kidney: Secondary | ICD-10-CM | POA: Diagnosis not present

## 2015-10-02 DIAGNOSIS — Z87891 Personal history of nicotine dependence: Secondary | ICD-10-CM | POA: Diagnosis not present

## 2015-10-02 DIAGNOSIS — Z7951 Long term (current) use of inhaled steroids: Secondary | ICD-10-CM | POA: Diagnosis not present

## 2015-10-02 DIAGNOSIS — I480 Paroxysmal atrial fibrillation: Secondary | ICD-10-CM

## 2015-10-02 DIAGNOSIS — E785 Hyperlipidemia, unspecified: Secondary | ICD-10-CM | POA: Diagnosis present

## 2015-10-02 DIAGNOSIS — C7800 Secondary malignant neoplasm of unspecified lung: Secondary | ICD-10-CM | POA: Diagnosis present

## 2015-10-02 DIAGNOSIS — F419 Anxiety disorder, unspecified: Secondary | ICD-10-CM | POA: Diagnosis present

## 2015-10-02 DIAGNOSIS — R0600 Dyspnea, unspecified: Secondary | ICD-10-CM | POA: Diagnosis not present

## 2015-10-02 DIAGNOSIS — Z955 Presence of coronary angioplasty implant and graft: Secondary | ICD-10-CM | POA: Diagnosis not present

## 2015-10-02 DIAGNOSIS — I9789 Other postprocedural complications and disorders of the circulatory system, not elsewhere classified: Secondary | ICD-10-CM | POA: Diagnosis present

## 2015-10-02 DIAGNOSIS — I739 Peripheral vascular disease, unspecified: Secondary | ICD-10-CM | POA: Diagnosis present

## 2015-10-02 DIAGNOSIS — J441 Chronic obstructive pulmonary disease with (acute) exacerbation: Secondary | ICD-10-CM | POA: Diagnosis not present

## 2015-10-02 DIAGNOSIS — I509 Heart failure, unspecified: Secondary | ICD-10-CM | POA: Diagnosis present

## 2015-10-02 DIAGNOSIS — I252 Old myocardial infarction: Secondary | ICD-10-CM | POA: Diagnosis not present

## 2015-10-02 DIAGNOSIS — I110 Hypertensive heart disease with heart failure: Secondary | ICD-10-CM | POA: Diagnosis present

## 2015-10-02 DIAGNOSIS — J9601 Acute respiratory failure with hypoxia: Secondary | ICD-10-CM | POA: Diagnosis not present

## 2015-10-02 DIAGNOSIS — R0902 Hypoxemia: Secondary | ICD-10-CM | POA: Diagnosis not present

## 2015-10-02 DIAGNOSIS — R0602 Shortness of breath: Secondary | ICD-10-CM | POA: Diagnosis not present

## 2015-10-02 DIAGNOSIS — Z7901 Long term (current) use of anticoagulants: Secondary | ICD-10-CM | POA: Diagnosis not present

## 2015-10-02 DIAGNOSIS — I2510 Atherosclerotic heart disease of native coronary artery without angina pectoris: Secondary | ICD-10-CM | POA: Diagnosis present

## 2015-10-02 DIAGNOSIS — N179 Acute kidney failure, unspecified: Secondary | ICD-10-CM | POA: Diagnosis present

## 2015-10-02 DIAGNOSIS — Y92230 Patient room in hospital as the place of occurrence of the external cause: Secondary | ICD-10-CM | POA: Diagnosis present

## 2015-10-02 DIAGNOSIS — Z905 Acquired absence of kidney: Secondary | ICD-10-CM | POA: Diagnosis not present

## 2015-10-02 DIAGNOSIS — Z880 Allergy status to penicillin: Secondary | ICD-10-CM | POA: Diagnosis not present

## 2015-10-02 LAB — BASIC METABOLIC PANEL
ANION GAP: 12 (ref 5–15)
BUN: 23 mg/dL — ABNORMAL HIGH (ref 6–20)
CO2: 21 mmol/L — AB (ref 22–32)
Calcium: 8.9 mg/dL (ref 8.9–10.3)
Chloride: 102 mmol/L (ref 101–111)
Creatinine, Ser: 1.46 mg/dL — ABNORMAL HIGH (ref 0.44–1.00)
GFR calc Af Amer: 43 mL/min — ABNORMAL LOW (ref >60)
GFR calc non Af Amer: 37 mL/min — ABNORMAL LOW (ref >60)
GLUCOSE: 149 mg/dL — AB (ref 65–99)
POTASSIUM: 4.1 mmol/L (ref 3.5–5.1)
Sodium: 135 mmol/L (ref 135–145)

## 2015-10-02 LAB — BLOOD GAS, ARTERIAL
Acid-base deficit: 2.2 mmol/L — ABNORMAL HIGH (ref 0.0–2.0)
Bicarbonate: 21.8 mEq/L (ref 20.0–24.0)
Drawn by: 280981
O2 Content: 4 L/min
O2 Saturation: 95.3 %
PATIENT TEMPERATURE: 98.6
PCO2 ART: 36 mmHg (ref 35.0–45.0)
PO2 ART: 75.6 mmHg — AB (ref 80.0–100.0)
TCO2: 23 mmol/L (ref 0–100)
pH, Arterial: 7.4 (ref 7.350–7.450)

## 2015-10-02 LAB — PROCALCITONIN: PROCALCITONIN: 0.12 ng/mL

## 2015-10-02 LAB — MRSA PCR SCREENING: MRSA BY PCR: NEGATIVE

## 2015-10-02 MED ORDER — IOPAMIDOL (ISOVUE-370) INJECTION 76%
INTRAVENOUS | Status: AC
  Administered 2015-10-02: 100 mL
  Filled 2015-10-02: qty 100

## 2015-10-02 MED ORDER — FUROSEMIDE 10 MG/ML IJ SOLN
20.0000 mg | Freq: Once | INTRAMUSCULAR | Status: AC
  Administered 2015-10-02: 20 mg via INTRAVENOUS
  Filled 2015-10-02: qty 2

## 2015-10-02 MED ORDER — IPRATROPIUM BROMIDE 0.02 % IN SOLN
0.5000 mg | Freq: Four times a day (QID) | RESPIRATORY_TRACT | Status: DC
  Administered 2015-10-02 – 2015-10-05 (×12): 0.5 mg via RESPIRATORY_TRACT
  Filled 2015-10-02 (×12): qty 2.5

## 2015-10-02 MED ORDER — BUDESONIDE 0.25 MG/2ML IN SUSP: 0.2500 mg | Freq: Four times a day (QID) | RESPIRATORY_TRACT | Status: DC

## 2015-10-02 MED ORDER — LEVALBUTEROL HCL 0.63 MG/3ML IN NEBU
0.6300 mg | INHALATION_SOLUTION | Freq: Four times a day (QID) | RESPIRATORY_TRACT | Status: DC
  Administered 2015-10-02 – 2015-10-05 (×12): 0.63 mg via RESPIRATORY_TRACT
  Filled 2015-10-02 (×12): qty 3

## 2015-10-02 MED ORDER — BUDESONIDE 0.25 MG/2ML IN SUSP
0.2500 mg | Freq: Four times a day (QID) | RESPIRATORY_TRACT | Status: DC
  Administered 2015-10-02 – 2015-10-05 (×12): 0.25 mg via RESPIRATORY_TRACT
  Filled 2015-10-02 (×12): qty 2

## 2015-10-02 NOTE — Progress Notes (Signed)
1 Day Post-Op  Subjective: Pt doing better overnight. Cardizem gtt ovenight.  Out of RVR this AM  Objective: Vital signs in last 24 hours: Temp:  [97.6 F (36.4 C)-99.2 F (37.3 C)] 98.3 F (36.8 C) (03/28 0400) Pulse Rate:  [85-139] 97 (03/28 0400) Resp:  [15-23] 23 (03/28 0400) BP: (108-164)/(57-109) 140/64 mmHg (03/28 0400) SpO2:  [90 %-99 %] 97 % (03/28 0400) FiO2 (%):  [40 %-100 %] 40 % (03/27 1920) Weight:  [90.311 kg (199 lb 1.6 oz)-91.627 kg (202 lb)] 90.311 kg (199 lb 1.6 oz) (03/27 1806) Last BM Date: 09/30/15  Intake/Output from previous day: 03/27 0701 - 03/28 0700 In: 1391 [I.V.:1391] Out: 1526 [Urine:1500; Emesis/NG output:1; Blood:25] Intake/Output this shift:    General appearance: alert and cooperative Cardio: regular rate and rhythm, S1, S2 normal, no murmur, click, rub or gallop GI: soft, approp ttp, ND incision c/d/i  Lab Results:  No results for input(s): WBC, HGB, HCT, PLT in the last 72 hours. BMET  Recent Labs  10/01/15 1930  NA 139  K 4.5  CL 106  CO2 21*  GLUCOSE 140*  BUN 17  CREATININE 1.64*  CALCIUM 8.8*   PT/INR No results for input(s): LABPROT, INR in the last 72 hours. ABG  Recent Labs  10/01/15 1810  PHART 7.338*  HCO3 20.7    Studies/Results: Dg Chest Port 1 View  10/02/2015  CLINICAL DATA:  Hypoxia EXAM: PORTABLE CHEST 1 VIEW COMPARISON:  10/01/2015 FINDINGS: Cardiac shadow is stable. Minimal scarring remains in the left lung. The right basilar nodule is not as well appreciated on the current exam. No bony abnormality is noted. IMPRESSION: No active disease. Electronically Signed   By: Inez Catalina M.D.   On: 10/02/2015 07:24   Dg Chest Port 1 View  10/01/2015  CLINICAL DATA:  Shortness of breath with low oxygen saturation. Abdominal hernia repair performed today. EXAM: PORTABLE CHEST 1 VIEW COMPARISON:  CT 06/06/2015.  Radiographs 05/10/2014) FINDINGS: 1559 hours. Two views were obtained. The heart size and mediastinal  contours are stable. There is aortic atherosclerosis. Right lower lobe pulmonary nodule appears unchanged. Other linear opacities in both lungs are stable. There is no evidence of superimposed edema, confluent airspace opacity, pleural effusion or pneumothorax. No acute osseous findings are seen. IMPRESSION: Stable chronic lung disease with stable right lower lobe pulmonary nodule. No acute findings demonstrated. Electronically Signed   By: Richardean Sale M.D.   On: 10/01/2015 16:14    Anti-infectives: Anti-infectives    Start     Dose/Rate Route Frequency Ordered Stop   10/01/15 0600  vancomycin (VANCOCIN) 1,500 mg in sodium chloride 0.9 % 500 mL IVPB     1,500 mg 250 mL/hr over 120 Minutes Intravenous On call to O.R. 09/30/15 1457 10/01/15 0909      Assessment/Plan: s/p Procedure(s): INCISIONAL HERNIA REPAIR (N/A) INSERTION OF MESH (N/A) Adv diet to FLD Give another voiding trial If OK with CCM to stop gtt will transfer to floor  Appreciate CCM assistance Mobilize  LOS: 1 day    Rosario Jacks., Anne Hahn 10/02/2015

## 2015-10-02 NOTE — Progress Notes (Signed)
Pt is unable to void after in-and-out cath was performed at 1710. Bladder scan showed 150cc in bladder. Pt states that she is very uncomfortable and feels as if she needs to urinate. Ninfa Linden, MD notified. He ordered for another in-and-out cath to be performed for now, and RN staff may place foley catheter if pt is still unable to void afterwards.

## 2015-10-02 NOTE — Progress Notes (Signed)
Pt. Was taken off bipap by RN earlier. Pt. Has been tolerating nasal cannula. No issues at this time.

## 2015-10-02 NOTE — Consult Note (Signed)
PULMONARY / CRITICAL CARE MEDICINE   Name: Allison [name] MRN: [id number] DOB: September 19, 1950    ADMISSION DATE:  10/01/2015 CONSULTATION DATE:  10/01/2015  REFERRING MD:  Dr. Rosendo Gros CCS  CHIEF COMPLAINT:  SOB  HISTORY OF PRESENT ILLNESS:   65 year old female with PMH as below, which includes COPD (Followed by MR), CHF, CAD, Atrial fib (on eliquis), and HTN. She also has history of renal cell carcinoma s/p nephrectomy about 1 year ago. She developed an incisional hernia secondary to that procedure. She presented to [facility] 3/27 for repair of that hernia. The procedure was without complication. She was admitted for overnight observation. Later that day upon PM rounding Dr. Rosendo Gros noted her to be somnolent and foaming at the mouth. She responded well to IV narcan and was given a breathing treatement, however she remained markedly dyspneic and tachypneic. Hypoxia with O2 sats in 70s on RA. Also tachycardic to 160s, PCCM to see.   REVIEW OF SYSTEMS:   Bolds are positive  Constitutional: weight loss, gain, night sweats, Fevers, chills, fatigue .  HEENT: headaches, Sore throat, sneezing, nasal congestion, post nasal drip, Difficulty swallowing, Tooth/dental problems, visual complaints visual changes, ear ache CV:  chest pain, radiates: ,Orthopnea, PND, swelling in lower extremities, dizziness, palpitations, syncope.  GI  heartburn, indigestion, abdominal pain, nausea, vomiting, diarrhea, change in bowel habits, loss of appetite, bloody stools.  Resp: cough, productive: , hemoptysis, dyspnea, chest pain, pleuritic.  Skin: rash or itching or icterus GU: dysuria, change in color of urine, urgency or frequency. flank pain, hematuria  MS: joint pain or swelling. decreased range of motion  Psych: change in mood or affect. depression or anxiety.  Neuro: difficulty with speech, weakness, numbness, ataxia   SUBJECTIVE: not feeling much better today. Did well on BiPAP overnight. Vomited x 1 but was able to pull  mask off prior. Came off BiPAP this am and was originally ok, but not she is moderately dyspneic again. Better on bipap  VITAL SIGNS: BP 126/77 mmHg  Pulse 95  Temp(Src) 97.9 F (36.6 C) (Oral)  Resp 24  Ht 5\' 7"  (1.702 m)  Wt 90.311 kg (199 lb 1.6 oz)  BMI 31.18 kg/m2  SpO2 94%  HEMODYNAMICS:    VENTILATOR SETTINGS: Vent Mode:  [-]  FiO2 (%):  [40 %-100 %] 40 %  INTAKE / OUTPUT: I/O last 3 completed shifts: In: 1391 [I.V.:1391] Out: 1526 [Urine:1500; Emesis/NG output:1; Blood:25]  PHYSICAL EXAMINATION: General:  Obese female in mild distress Neuro:  Alert, oriented, non-focal HEENT:  [place]/AT, PERRL, no JVD Cardiovascular:  IRIR rate controlled Lungs:  Wheeze better, shallow rapid breathing.  Abdomen:  Soft, non-tender, non-distended Musculoskeletal:  No acute deformity or ROM limitation Skin:  Grossly intact with exception of surgical incisions.   LABS:  BMET  Recent Labs Lab 09/26/15 1039 10/01/15 1930  NA 142 139  K 4.9 4.5  CL 108 106  CO2 24 21*  BUN 11 17  CREATININE 1.20* 1.64*  GLUCOSE 110* 140*    Electrolytes  Recent Labs Lab 09/26/15 1039 10/01/15 1930  CALCIUM 9.9 8.8*  MG  --  1.7    CBC  Recent Labs Lab 09/26/15 1039  WBC 12.5*  HGB 14.1  HCT 43.1  PLT 313    Coag's No results for input(s): APTT, INR in the last 168 hours.  Sepsis Markers No results for input(s): LATICACIDVEN, PROCALCITON, O2SATVEN in the last 168 hours.  ABG  Recent Labs Lab 10/01/15 1810  PHART 7.338*  PCO2ART 39.6  PO2ART 144*    Liver Enzymes No results for input(s): AST, ALT, ALKPHOS, BILITOT, ALBUMIN in the last 168 hours.  Cardiac Enzymes No results for input(s): TROPONINI, PROBNP in the last 168 hours.  Glucose No results for input(s): GLUCAP in the last 168 hours.  Imaging Dg Chest Port 1 View  10/02/2015  CLINICAL DATA:  Hypoxia EXAM: PORTABLE CHEST 1 VIEW COMPARISON:  10/01/2015 FINDINGS: Cardiac shadow is stable. Minimal  scarring remains in the left lung. The right basilar nodule is not as well appreciated on the current exam. No bony abnormality is noted. IMPRESSION: No active disease. Electronically Signed   By: Inez Catalina M.D.   On: 10/02/2015 07:24   Dg Chest Port 1 View  10/01/2015  CLINICAL DATA:  Shortness of breath with low oxygen saturation. Abdominal hernia repair performed today. EXAM: PORTABLE CHEST 1 VIEW COMPARISON:  CT 06/06/2015.  Radiographs 05/10/2014) FINDINGS: 1559 hours. Two views were obtained. The heart size and mediastinal contours are stable. There is aortic atherosclerosis. Right lower lobe pulmonary nodule appears unchanged. Other linear opacities in both lungs are stable. There is no evidence of superimposed edema, confluent airspace opacity, pleural effusion or pneumothorax. No acute osseous findings are seen. IMPRESSION: Stable chronic lung disease with stable right lower lobe pulmonary nodule. No acute findings demonstrated. Electronically Signed   By: Richardean Sale M.D.   On: 10/01/2015 16:14     STUDIES:  CTA chest 3/28 >  CULTURES: none  ANTIBIOTICS: Vancomycin (surg ppx) 3/27 >  SIGNIFICANT EVENTS: 3/27 to OR for hernia repair, lethargy and desat post op  LINES/TUBES: PIV  DISCUSSION: 65 year old female with presetned for elective incisional hernia repair 3/27. Procedure was without complication. Post op was lethargic, likely due to IV opiates. Responded to narcan, but remained hypoxic and tachycardic. EKG showed AF RVR. Started on BiPAP with improvement. Will continue to monitor.   ASSESSMENT / PLAN:  PULMONARY A: Acute hypoxemic respiratory failure, unlcear etiology. COPD vs PE. No findings on CXR COPD without obvious acute exacerbation (on nocturnal O2) Pulmonary nodule on CXR (metastatic renal cell carcinoma to lung per hx)  P:   BiPAP PRN > will need to go back on now ABG Follow CXR Pulmonary hygiene Scheduled/PRN Nebs Consider adding steroids in  absence of obvious SOB cause. Not bronchospastic on exam. Add pulmicort nebs CT angio chest to r/o PE  CARDIOVASCULAR A:  Atrial fibrillation with rapid ventricular response > refractory to IV lopressor, rate controlled with dilt gtt.  CHF (Recent echo [01/2015] with LVEF 55-60%, could not evaluate diastolic dysfunction) HTN  P:  Telemetry monitoring Continue dilt gtt Po lasix per home regimen Repeat echo Check troponin Continue home metoprolol, lasix  RENAL A:   AKI Urinary retention in post op setting  P:   KVO IVF Follow BMP In and out cath x 3, if still with retention place foley Strict I&O  GASTROINTESTINAL A:   S/p repair incisional hernia with incarcerated fat.  P:   NPO Protonix for SUP CCS primary  HEMATOLOGIC A:   On eliquis for AF Concern PE  P:  Enoxaparin subcutaneous for VTE ppx SCDs Follow CBC Resume eliquis when ok with CCS  INFECTIOUS A:   ? Aspiration pneumonitis  P:   Culture Defer ABX for now Follow WBC and fever curve Trend PCT  ENDOCRINE A:   No acute issues  P:   Follow glucose on daily BMP  NEUROLOGIC A:   Acute encephalopathy secondary  to opiate > improved with narcan.  Pain management   P:   RASS goal: 0 Careful use of narcotics especially as she is denying pain.  Consider narcan gtt if unable to remain awake  FAMILY  - Updates: patient updated 3/27  - Inter-disciplinary family meet or Palliative Care meeting due by:  4/5   Georgann Housekeeper, AGACNP-BC [place] [facility] Pager [phone] or [phone]  10/02/2015 11:57 AM    ATTENDING NOTE: I have personally reviewed patient's available data, including medical history, events of note, physical examination and test results as part of my evaluation. I have discussed with resident/NP Georgann Housekeeper.  l68 y.o. year old female, admitted for elective hernia repair. Had rapi afib post op. Placed on BipaP -- did well overnight but had resp  distress off bipap.  Placed on biap >> better.   On exam, HR better. BP stable. 97% on bipap.    Labs reviewed. Pertinent labs include the following: ABG 7.4,36,76 on 4L   Imaging reviewed personally.    Impression Acute Hypoxemic resp failure initially related to post op rapid afib, likely with AECOPD. R/O PE.  Afib -- better controlled.   S/P hernia repair.   Plan: 1. Continue BiPAP. 2. Agree with CTA  to rule out pulmonary embolism causing hypoxemia. Patient has been off Eliquis since Thursday of last week. 3. I asked the nurse to ask primary team whether recurrent resume Eliquis 5 g twice a day. 4. Start pulmicort. Cont atroven, xopenex. 5. Cont vanc 6. Hold off on steroids.  7. dvt prophylaxis.  8. Cont dilt drip. Overlap with PO lopressor or dilt when able.   Code status :full code.   Monica Becton, MD 10/02/2015,   12:30 PM [place] [facility] Pager [phone] After 3 pm or if no answer, call [phone]

## 2015-10-03 ENCOUNTER — Inpatient Hospital Stay (HOSPITAL_COMMUNITY): Payer: Medicaid Other

## 2015-10-03 DIAGNOSIS — R0600 Dyspnea, unspecified: Secondary | ICD-10-CM

## 2015-10-03 LAB — BASIC METABOLIC PANEL
Anion gap: 11 (ref 5–15)
BUN: 18 mg/dL (ref 6–20)
CALCIUM: 8.8 mg/dL — AB (ref 8.9–10.3)
CO2: 23 mmol/L (ref 22–32)
CREATININE: 1.37 mg/dL — AB (ref 0.44–1.00)
Chloride: 101 mmol/L (ref 101–111)
GFR calc non Af Amer: 40 mL/min — ABNORMAL LOW (ref >60)
GFR, EST AFRICAN AMERICAN: 46 mL/min — AB (ref >60)
Glucose, Bld: 133 mg/dL — ABNORMAL HIGH (ref 65–99)
Potassium: 4 mmol/L (ref 3.5–5.1)
SODIUM: 135 mmol/L (ref 135–145)

## 2015-10-03 LAB — CBC
HCT: 37.1 % (ref 36.0–46.0)
Hemoglobin: 11.9 g/dL — ABNORMAL LOW (ref 12.0–15.0)
MCH: 28.7 pg (ref 26.0–34.0)
MCHC: 32.1 g/dL (ref 30.0–36.0)
MCV: 89.4 fL (ref 78.0–100.0)
Platelets: 241 10*3/uL (ref 150–400)
RBC: 4.15 MIL/uL (ref 3.87–5.11)
RDW: 15 % (ref 11.5–15.5)
WBC: 16.8 10*3/uL — ABNORMAL HIGH (ref 4.0–10.5)

## 2015-10-03 LAB — ECHOCARDIOGRAM COMPLETE
HEIGHTINCHES: 67 in
WEIGHTICAEL: 3185.6 [oz_av]

## 2015-10-03 MED ORDER — METHYLPREDNISOLONE SODIUM SUCC 40 MG IJ SOLR
40.0000 mg | Freq: Three times a day (TID) | INTRAMUSCULAR | Status: DC
  Administered 2015-10-03 – 2015-10-04 (×3): 40 mg via INTRAVENOUS
  Filled 2015-10-03 (×4): qty 1

## 2015-10-03 MED ORDER — TAMSULOSIN HCL 0.4 MG PO CAPS
0.4000 mg | ORAL_CAPSULE | Freq: Every day | ORAL | Status: DC
  Administered 2015-10-03 – 2015-10-05 (×3): 0.4 mg via ORAL
  Filled 2015-10-03 (×3): qty 1

## 2015-10-03 MED ORDER — ENOXAPARIN SODIUM 40 MG/0.4ML ~~LOC~~ SOLN: 40.0000 mg | SUBCUTANEOUS | Status: DC

## 2015-10-03 MED ORDER — APIXABAN 5 MG PO TABS
5.0000 mg | ORAL_TABLET | Freq: Two times a day (BID) | ORAL | Status: DC
  Administered 2015-10-03 – 2015-10-05 (×5): 5 mg via ORAL
  Filled 2015-10-03 (×5): qty 1

## 2015-10-03 MED ORDER — ACETAMINOPHEN 325 MG PO TABS
325.0000 mg | ORAL_TABLET | Freq: Four times a day (QID) | ORAL | Status: DC | PRN
Start: 2015-10-03 — End: 2015-10-05

## 2015-10-03 NOTE — Discharge Instructions (Signed)
[facility] _______[facility] ° °HERNIA REPAIR: POST OP INSTRUCTIONS ° °Always review your discharge instruction sheet given to you by the facility where your surgery was performed. °IF YOU HAVE DISABILITY OR FAMILY LEAVE FORMS, YOU MUST BRING THEM TO THE OFFICE FOR PROCESSING.   °DO NOT GIVE THEM TO YOUR DOCTOR. ° °1. A  prescription for pain medication may be given to you upon discharge.  Take your pain medication as prescribed, if needed.  If narcotic pain medicine is not needed, then you may take acetaminophen (Tylenol) or ibuprofen (Advil) as needed. °2. Take your usually prescribed medications unless otherwise directed. °3. If you need a refill on your pain medication, please contact your pharmacy.  They will contact our office to request authorization. Prescriptions will not be filled after 5 pm or on week-ends. °4. You should follow a light diet the first 24 hours after arrival home, such as soup and crackers, etc.  Be sure to include lots of fluids daily.  Resume your normal diet the day after surgery. °5. Most patients will experience some swelling and bruising around the umbilicus or in the groin and scrotum.  Ice packs and reclining will help.  Swelling and bruising can take several days to resolve.  °6. It is common to experience some constipation if taking pain medication after surgery.  Increasing fluid intake and taking a stool softener (such as Colace) will usually help or prevent this problem from occurring.  A mild laxative (Milk of Magnesia or Miralax) should be taken according to package directions if there are no bowel movements after 48 hours. °7. Unless discharge instructions indicate otherwise, you may remove your bandages 24-48 hours after surgery, and you may shower at that time.  You may have steri-strips (small skin tapes) in place directly over the incision.  These strips should be left on the skin for 7-10 days.  If your surgeon used skin glue on the incision, you may shower  in 24 hours.  The glue will flake off over the next 2-3 weeks.  Any sutures or staples will be removed at the office during your follow-up visit. °8. ACTIVITIES:  You may resume regular (light) daily activities beginning the next day--such as daily self-care, walking, climbing stairs--gradually increasing activities as tolerated.  You may have sexual intercourse when it is comfortable.  Refrain from any heavy lifting or straining until approved by your doctor. °a. You may drive when you are no longer taking prescription pain medication, you can comfortably wear a seatbelt, and you can safely maneuver your car and apply brakes. °b. RETURN TO WORK:  __________________________________________________________ °9. You should see your doctor in the office for a follow-up appointment approximately 2-3 weeks after your surgery.  Make sure that you call for this appointment within a day or two after you arrive home to insure a convenient appointment time. °10. OTHER INSTRUCTIONS:  __________________________________________________________________________________________________________________________________________________________________________________________  °WHEN TO CALL YOUR DOCTOR: °1. Fever over 101.0 °2. Inability to urinate °3. Nausea and/or vomiting °4. Extreme swelling or bruising °5. Continued bleeding from incision. °6. Increased pain, redness, or drainage from the incision ° °The clinic staff is available to answer your questions during regular business hours.  Please don’t hesitate to call and ask to speak to one of the nurses for clinical concerns.  If you have a medical emergency, go to the nearest emergency room or call 911.  A surgeon from [facility] is always on call at the hospital ° ° °[address]   [address], [place], [place], [place]  [address] ?  [address], [place], [place]   [address] [phone] ? [phone] ? FAX [phone] Web site:  [email or web address]  Information on my medicine - ELIQUIS (apixaban)  This medication education was reviewed with me or my healthcare representative as part of my discharge preparation.    Why was Eliquis prescribed for you? Eliquis was prescribed for you to reduce the risk of forming blood clots that can cause a stroke if you have a medical condition called atrial fibrillation (a type of irregular heartbeat) OR to reduce the risk of a blood clots forming after orthopedic surgery.  What do You need to know about Eliquis ? Take your Eliquis TWICE DAILY - one tablet in the morning and one tablet in the evening with or without food.  It would be best to take the doses about the same time each day.  If you have difficulty swallowing the tablet whole please discuss with your pharmacist how to take the medication safely.  Take Eliquis exactly as prescribed by your doctor and DO NOT stop taking Eliquis without talking to the doctor who prescribed the medication.  Stopping may increase your risk of developing a new clot or stroke.  Refill your prescription before you run out.  After discharge, you should have regular check-up appointments with your healthcare provider that is prescribing your Eliquis.  In the future your dose may need to be changed if your kidney function or weight changes by a significant amount or as you get older.  What do you do if you miss a dose? If you miss a dose, take it as soon as you remember on the same day and resume taking twice daily.  Do not take more than one dose of ELIQUIS at the same time.  Important Safety Information A possible side effect of Eliquis is bleeding. You should call your healthcare provider right away if you experience any of the following: ? Bleeding from an injury or your nose that does not stop. ? Unusual colored urine (red or dark brown) or unusual colored stools (red or black). ? Unusual bruising for unknown reasons. ? A  serious fall or if you hit your head (even if there is no bleeding).  Some medicines may interact with Eliquis and might increase your risk of bleeding or clotting while on Eliquis. To help avoid this, consult your healthcare provider or pharmacist prior to using any new prescription or non-prescription medications, including herbals, vitamins, non-steroidal anti-inflammatory drugs (NSAIDs) and supplements.  This website has more information on Eliquis (apixaban): [email or web address].no.

## 2015-10-03 NOTE — Progress Notes (Signed)
  Echocardiogram 2D Echocardiogram has been performed.  Johny Chess 10/03/2015, 11:27 AM

## 2015-10-03 NOTE — Progress Notes (Signed)
2 Days Post-Op  Subjective: Pt with no acute changes overnight.  COn't on BiPAP.   CT chest noted. Tol PO  Objective: Vital signs in last 24 hours: Temp:  [97.8 F (36.6 C)-99.8 F (37.7 C)] 99.8 F (37.7 C) (03/29 0328) Pulse Rate:  [84-119] 119 (03/29 0400) Resp:  [20-31] 27 (03/29 0400) BP: (124-145)/(66-81) 133/70 mmHg (03/29 0400) SpO2:  [93 %-100 %] 99 % (03/29 0400) FiO2 (%):  [40 %] 40 % (03/29 0103) Last BM Date: 10/02/15  Intake/Output from previous day: 03/28 0701 - 03/29 0700 In: 270.4 [I.V.:270.4] Out: 1350 [Urine:1350] Intake/Output this shift:   Gen: AAOx3 Cards: irreg, irreg, no tachy GI: incision c/d/i, active BS   Lab Results:   Recent Labs  10/03/15 0250  WBC 16.8*  HGB 11.9*  HCT 37.1  PLT 241   BMET  Recent Labs  10/02/15 1210 10/03/15 0250  NA 135 135  K 4.1 4.0  CL 102 101  CO2 21* 23  GLUCOSE 149* 133*  BUN 23* 18  CREATININE 1.46* 1.37*  CALCIUM 8.9 8.8*   PT/INR No results for input(s): LABPROT, INR in the last 72 hours. 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 Multiplanar CT image reconstructions and MIPs were obtained to evaluate the vascular anatomy. CONTRAST:  100 mL Isovue 370 COMPARISON:  06/06/2015 FINDINGS: Lungs are well aerated bilaterally. Some mild basilar atelectatic changes are noted. Multiple pulmonary nodules are seen bilaterally which are stable from the previous exam consistent with the known history of metastatic disease. The thoracic inlet is within normal limits. Thoracic aorta shows  atherosclerotic calcifications without significant aneurysmal dilatation. Pulmonary artery is well visualized within normal branching pattern. No filling defect to suggest pulmonary embolism is noted. No significant hilar or mediastinal adenopathy is noted. The visualized upper abdomen shows postsurgical changes anteriorly consistent with the recent hernia surgery. No acute bony abnormality is noted. Review of the MIP images confirms the above findings. IMPRESSION: Stable pulmonary metastatic disease. No evidence of pulmonary emboli. Electronically Signed   By: Inez Catalina M.D.   On: 10/02/2015 14:51   Dg Chest Port 1 View  10/02/2015  CLINICAL DATA:  Hypoxia EXAM: PORTABLE CHEST 1 VIEW COMPARISON:  10/01/2015 FINDINGS: Cardiac shadow is stable. Minimal scarring remains in the left lung. The right basilar nodule is not as well appreciated on the current exam. No bony abnormality is noted. IMPRESSION: No active disease. Electronically Signed   By: Inez Catalina M.D.   On: 10/02/2015 07:24   Dg Chest Port 1 View  10/01/2015  CLINICAL DATA:  Shortness of breath with low oxygen saturation. Abdominal hernia repair performed today. EXAM: PORTABLE CHEST 1 VIEW COMPARISON:  CT 06/06/2015.  Radiographs 05/10/2014) FINDINGS: 1559 hours. Two views were obtained. The heart size and mediastinal contours are stable. There is aortic atherosclerosis. Right lower lobe pulmonary nodule appears unchanged. Other linear opacities in both lungs are stable. There is no evidence of superimposed edema, confluent airspace opacity, pleural effusion or pneumothorax. No acute osseous findings are seen. IMPRESSION: Stable chronic lung disease with stable right lower lobe pulmonary nodule. No  acute findings demonstrated. Electronically Signed   By: Richardean Sale M.D.   On: 10/01/2015 16:14    Anti-infectives: Anti-infectives    Start     Dose/Rate Route Frequency Ordered Stop   10/01/15 0600  vancomycin (VANCOCIN) 1,500 mg in sodium  chloride 0.9 % 500 mL IVPB     1,500 mg 250 mL/hr over 120 Minutes Intravenous On call to O.R. 09/30/15 1457 10/01/15 0909      Assessment/Plan: s/p Procedure(s): INCISIONAL HERNIA REPAIR (N/A) INSERTION OF MESH (N/A) Advance diet to soft Mobilize Appreciate Pulm assistance.  BiPAP per pulm.   To Floor with OK with Pulm  LOS: 2 days    Rosario Jacks., [facility] 10/03/2015

## 2015-10-03 NOTE — Progress Notes (Signed)
PULMONARY / CRITICAL CARE MEDICINE   Name: Allison [name] MRN: [id number] DOB: 15-Sep-1950    ADMISSION DATE:  10/01/2015 CONSULTATION DATE:  10/01/2015  REFERRING MD:  Dr. Rosendo Gros CCS  CHIEF COMPLAINT:  SOB  HISTORY OF PRESENT ILLNESS:   65 year old female with PMH as below, which includes COPD (Followed by MR), CHF, CAD, Atrial fib (on eliquis), and HTN. She also has history of renal cell carcinoma s/p nephrectomy about 1 year ago. She developed an incisional hernia secondary to that procedure. She [facility] for repair of that hernia. The procedure was without complication. She was admitted for overnight observation. Later that day upon PM rounding Dr. Rosendo Gros noted her to be somnolent and foaming at the mouth. She responded well to IV narcan and was given a breathing treatement, however she remained markedly dyspneic and tachypneic. Hypoxia with O2 sats in 70s on RA. Also tachycardic to 160s, PCCM to see.     SUBJECTIVE: Slightly better today off nimvs.  VITAL SIGNS: BP 139/80 mmHg  Pulse 101  Temp(Src) 98.4 F (36.9 C) (Axillary)  Resp 30  Ht 5\' 7"  (1.702 m)  Wt 199 lb 1.6 oz (90.311 kg)  BMI 31.18 kg/m2  SpO2 92%  HEMODYNAMICS:    VENTILATOR SETTINGS: Vent Mode:  [-]  FiO2 (%):  [40 %] 40 %  INTAKE / OUTPUT: I/O last 3 completed shifts: In: 373.9 [I.V.:373.9] Out: [id number]; Emesis/NG output:1]  PHYSICAL EXAMINATION: General:  Obese female in no distress currently Neuro:  Alert, oriented, non-focal HEENT:  [place]/AT, PERRL, no JVD Cardiovascular:  IRIR rate controlled Lungs:  Poor air movement.   Abdomen:  Soft, non-tender, non-distended Musculoskeletal:  No acute deformity or ROM limitation Skin:  Grossly intact with exception of surgical incisions.   LABS:  BMET  Recent Labs Lab 10/01/15 1930 10/02/15 1210 10/03/15 0250  NA 139 135 135  K 4.5 4.1 4.0  CL 106 102 101  CO2 21* 21* 23  BUN 17 23* 18  CREATININE 1.64* 1.46* 1.37*  GLUCOSE  140* 149* 133*    Electrolytes  Recent Labs Lab 10/01/15 1930 10/02/15 1210 10/03/15 0250  CALCIUM 8.8* 8.9 8.8*  MG 1.7  --   --     CBC  Recent Labs Lab 09/26/15 1039 10/03/15 0250  WBC 12.5* 16.8*  HGB 14.1 11.9*  HCT 43.1 37.1  PLT 313 241    Coag's No results for input(s): APTT, INR in the last 168 hours.  Sepsis Markers  Recent Labs Lab 10/02/15 1210  PROCALCITON 0.12    ABG  Recent Labs Lab 10/01/15 1810 10/02/15 1204  PHART 7.338* 7.400  PCO2ART 39.6 36.0  PO2ART 144* 75.6*    Liver Enzymes No results for input(s): AST, ALT, ALKPHOS, BILITOT, ALBUMIN in the last 168 hours.  Cardiac Enzymes No results for input(s): TROPONINI, PROBNP in the last 168 hours.  Glucose No results for input(s): GLUCAP in the last 168 hours.  Imaging Ct Angio Chest Pe W/cm &/or Wo Cm  10/02/2015  CLINICAL DATA:  History renal cell carcinoma with pulmonary metastatic disease and shortness of Breath following recent surgery EXAM: CT ANGIOGRAPHY CHEST WITH CONTRAST TECHNIQUE: Multidetector CT imaging of the chest was performed using the standard protocol during bolus administration of intravenous contrast. Multiplanar CT image reconstructions and MIPs were obtained to evaluate the vascular anatomy. CONTRAST:  100 mL Isovue 370 COMPARISON:  06/06/2015 FINDINGS: Lungs are well aerated bilaterally. Some mild basilar atelectatic changes are noted. Multiple  pulmonary nodules are seen bilaterally which are stable from the previous exam consistent with the known history of metastatic disease. The thoracic inlet is within normal limits. Thoracic aorta shows atherosclerotic calcifications without significant aneurysmal dilatation. Pulmonary artery is well visualized within normal branching pattern. No filling defect to suggest pulmonary embolism is noted. No significant hilar or mediastinal adenopathy is noted. The visualized upper abdomen shows postsurgical changes anteriorly  consistent with the recent hernia surgery. No acute bony abnormality is noted. Review of the MIP images confirms the above findings. IMPRESSION: Stable pulmonary metastatic disease. No evidence of pulmonary emboli. Electronically Signed   By: Inez Catalina M.D.   On: 10/02/2015 14:51     STUDIES:  CTA chest 3/28 >  CULTURES: none  ANTIBIOTICS: Vancomycin (surg ppx) 3/27 >  SIGNIFICANT EVENTS: 3/27 to OR for hernia repair, lethargy and desat post op  LINES/TUBES: PIV  DISCUSSION: 65 year old female with presetned for elective incisional hernia repair 3/27. Procedure was without complication. Post op was lethargic, likely due to IV opiates. Responded to narcan, but remained hypoxic and tachycardic. EKG showed AF RVR. Started on BiPAP with improvement. 3/29 more awake and less resp distress.   ASSESSMENT / PLAN:  PULMONARY A: Acute hypoxemic respiratory failure, unlcear etiology. COPD vs PE. 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 Not bronchospastic on exam. Add pulmicort nebs CT angio chest to r/o PE was negative  CARDIOVASCULAR A:  Atrial fibrillation with rapid ventricular response > refractory to IV lopressor, rate controlled with dilt gtt.  CHF (Recent echo [01/2015] with LVEF 55-60%, could not evaluate diastolic dysfunction) HTN  P:  Telemetry monitoring Continue dilt gtt Po lasix per home regimen Repeat echo Troponin neg Continue home metoprolol, lasix  RENAL A:   AKI Urinary retention in post op setting  P:   KVO IVF Follow BMP In and out cath x 3, if still with retention place foley Strict I&O  GASTROINTESTINAL A:   S/p repair incisional hernia with incarcerated fat.  P:    NPO Protonix for SUP CCS primary  HEMATOLOGIC A:   On eliquis for AF Concern PE but negative CT angio  P:  Enoxaparin subcutaneous for VTE ppx SCDs Follow CBC Resume eliquis when ok with CCS  INFECTIOUS A:   ? Aspiration pneumonitis  P:   Culture Defer ABX for now Follow WBC and fever curve Trend PCT  ENDOCRINE A:   No acute issues  P:   Follow glucose on daily BMP  NEUROLOGIC A:   Acute encephalopathy secondary to opiate > improved with narcan.  Pain management  Awake and alert currently  P:   RASS goal: 0 Careful use of narcotics especially as she is denying pain.    FAMILY  - Updates: patient updated 3/27  - Inter-disciplinary family meet or Palliative Care meeting due by:  4/5   Richardson Landry Jaimes Eckert ACNP Maryanna Shape PCCM Pager [phone] till 3 pm If no answer page [phone] 10/03/2015, 9:54 AM

## 2015-10-04 DIAGNOSIS — J441 Chronic obstructive pulmonary disease with (acute) exacerbation: Secondary | ICD-10-CM | POA: Insufficient documentation

## 2015-10-04 LAB — PROCALCITONIN: PROCALCITONIN: 0.28 ng/mL

## 2015-10-04 MED ORDER — PANTOPRAZOLE SODIUM 40 MG PO TBEC
40.0000 mg | DELAYED_RELEASE_TABLET | Freq: Every day | ORAL | Status: DC
  Administered 2015-10-04: 40 mg via ORAL
  Filled 2015-10-04: qty 1

## 2015-10-04 MED ORDER — METHYLPREDNISOLONE SODIUM SUCC 40 MG IJ SOLR
40.0000 mg | Freq: Two times a day (BID) | INTRAMUSCULAR | Status: DC
  Administered 2015-10-04: 40 mg via INTRAVENOUS

## 2015-10-04 NOTE — Progress Notes (Signed)
PULMONARY / CRITICAL CARE MEDICINE   Name: Allison [name] MRN: [id number] DOB: Mar 06, 1951    ADMISSION DATE:  10/01/2015 CONSULTATION DATE:  10/01/2015  REFERRING MD:  Dr. Rosendo Gros CCS  CHIEF COMPLAINT:  SOB   SUBJECTIVE: clinically better. On o2. Less SOB. On lopressor and diltiazem drip. No resp issues overnight. Had SOB yesterday but better today.   VITAL SIGNS: BP 144/73 mmHg  Pulse 92  Temp(Src) 97.7 F (36.5 C) (Oral)  Resp 19  Ht 5\' 7"  (1.702 m)  Wt 199 lb 1.6 oz (90.311 kg)  BMI 31.18 kg/m2  SpO2 100%  HEMODYNAMICS:    VENTILATOR SETTINGS: Vent Mode:  [-]  FiO2 (%):  [40 %] 40 %  INTAKE / OUTPUT: I/O last 3 completed shifts: In: 822.9 [P.O.:240; I.V.:582.9] Out: 1500 [Urine:1500]  PHYSICAL EXAMINATION: General:  Obese female in no distress currently Neuro:  Alert, oriented, non-focal HEENT:  [place]/AT, PERRL, no JVD Cardiovascular:  IRIR rate controlled Lungs:  Better ae. Sig less wheeze.  Abdomen:  Soft, non-tender, non-distended Musculoskeletal:  No acute deformity or ROM limitation Skin:  Grossly intact with exception of surgical incisions.   LABS:  BMET  Recent Labs Lab 10/01/15 1930 10/02/15 1210 10/03/15 0250  NA 139 135 135  K 4.5 4.1 4.0  CL 106 102 101  CO2 21* 21* 23  BUN 17 23* 18  CREATININE 1.64* 1.46* 1.37*  GLUCOSE 140* 149* 133*    Electrolytes  Recent Labs Lab 10/01/15 1930 10/02/15 1210 10/03/15 0250  CALCIUM 8.8* 8.9 8.8*  MG 1.7  --   --     CBC  Recent Labs Lab 10/03/15 0250  WBC 16.8*  HGB 11.9*  HCT 37.1  PLT 241    Coag's No results for input(s): APTT, INR in the last 168 hours.  Sepsis Markers  Recent Labs Lab 10/02/15 1210 10/04/15 0448  PROCALCITON 0.12 0.28    ABG  Recent Labs Lab 10/01/15 1810 10/02/15 1204  PHART 7.338* 7.400  PCO2ART 39.6 36.0  PO2ART 144* 75.6*    Liver Enzymes No results for input(s): AST, ALT, ALKPHOS, BILITOT, ALBUMIN in the last 168 hours.  Cardiac  Enzymes No results for input(s): TROPONINI, PROBNP in the last 168 hours.  Glucose No results for input(s): GLUCAP in the last 168 hours.  Imaging No results found.   STUDIES:  CTA chest 3/28 > (-) for PE  CULTURES: none  ANTIBIOTICS: Vancomycin (surg ppx) 3/27 >  SIGNIFICANT EVENTS: 3/27 to OR for hernia repair, lethargy and desat post op  LINES/TUBES: PIV  DISCUSSION: 65 year old female with presetned for elective incisional hernia repair 3/27. Procedure was without complication. Post op was lethargic, likely due to IV opiates. Responded to narcan, but remained hypoxic and tachycardic. EKG showed AF RVR. Started on BiPAP with improvement. 3/29 more awake and less resp distress. Improved last 24 hrs.   ASSESSMENT / PLAN:  PULMONARY A: Acute hypoxemic respiratory failure 2/2 rapid afib, AECOPD. 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 Suggest to taper off steroids/pred over 3days. Maybe switch to PO pred 30 mg in am x 3 days then d/c Scheduled/PRN Nebs Cont neb meds. Outpt MDIs  CARDIOVASCULAR A:  Atrial fibrillation with rapid ventricular response > refractory to IV lopressor, rate controlled with dilt gtt.  CHF (Recent echo [01/2015] with LVEF 55-60%,  could not evaluate diastolic dysfunction) HTN  P:  Telemetry monitoring Continue dilt gtt >> taper off. Told RN.  Po lasix per home regimen Continue home metoprolol, lasix   Family updated at bedisde. Anticipate transfer to floor today and d/c soon. PCCM to sign off. Call back if with questions.     Monica Becton, MD 10/04/2015, 2:17 PM [facility] Pager [phone] After 3 pm or if no answer, call [phone]

## 2015-10-04 NOTE — Progress Notes (Signed)
3 Days Post-Op  Subjective: Feels more comfortable today from breathing standpoint. Tol PO well Mobilizing Elliquis restarted  Objective: Vital signs in last 24 hours: Temp:  [98.4 F (36.9 C)-100.4 F (38 C)] 98.6 F (37 C) (03/30 0309) Pulse Rate:  [84-109] 93 (03/30 0320) Resp:  [17-34] 19 (03/30 0320) BP: (116-147)/(47-111) 121/87 mmHg (03/30 0309) SpO2:  [91 %-99 %] 97 % (03/30 0320) FiO2 (%):  [40 %] 40 % (03/30 0000) Last BM Date: 09/30/15  Intake/Output from previous day: 03/29 0701 - 03/30 0700 In: 540 [P.O.:240; I.V.:300] Out: 650 [Urine:650] Intake/Output this shift:    General appearance: alert and cooperative Cardio: regular rate and rhythm, S1, S2 normal, no murmur, click, rub or gallop GI: soft, non-tender; bowel sounds normal; no masses,  no organomegaly  Lab Results:   Recent Labs  10/03/15 0250  WBC 16.8*  HGB 11.9*  HCT 37.1  PLT 241   BMET  Recent Labs  10/02/15 1210 10/03/15 0250  NA 135 135  K 4.1 4.0  CL 102 101  CO2 21* 23  GLUCOSE 149* 133*  BUN 23* 18  CREATININE 1.46* 1.37*  CALCIUM 8.9 8.8*   PT/INR No results for input(s): LABPROT, INR in the last 72 hours. 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 Multiplanar CT image reconstructions and MIPs were obtained to evaluate the vascular anatomy. CONTRAST:  100 mL Isovue 370 COMPARISON:  06/06/2015 FINDINGS: Lungs are well aerated bilaterally. Some mild basilar atelectatic changes are noted. Multiple pulmonary nodules are seen bilaterally which are stable from  the previous exam consistent with the known history of metastatic disease. The thoracic inlet is within normal limits. Thoracic aorta shows atherosclerotic calcifications without significant aneurysmal dilatation. Pulmonary artery is well visualized within normal branching pattern. No filling defect to suggest pulmonary embolism is noted. No significant hilar or mediastinal adenopathy is noted. The visualized upper abdomen shows postsurgical changes anteriorly consistent with the recent hernia surgery. No acute bony abnormality is noted. Review of the MIP images confirms the above findings. IMPRESSION: Stable pulmonary metastatic disease. No evidence of pulmonary emboli. Electronically Signed   By: Inez Catalina M.D.   On: 10/02/2015 14:51    Anti-infectives: Anti-infectives    Start     Dose/Rate Route Frequency Ordered Stop   10/01/15 0600  vancomycin (VANCOCIN) 1,500 mg in sodium chloride 0.9 % 500 mL IVPB     1,500 mg 250 mL/hr over 120 Minutes Intravenous On call to O.R. 09/30/15 1457 10/01/15 0909      Assessment/Plan: s/p Procedure(s): INCISIONAL HERNIA REPAIR (N/A) INSERTION OF MESH (N/A) ADv diet to low fat reg Pt on home lopressor Hopefully can DC dilt gtt and move to floor today if CCM agrees. Home in next 1-2d.  LOS: 3 days    Rosario Jacks., [facility] 10/04/2015

## 2015-10-04 NOTE — Care Management Note (Signed)
Case Management Note  Patient Details  Name: Allison [name] MRN: [id number] Date of Birth: 1951/02/26  Subjective/Objective:     Pt is s/p hernia repair               Action/Plan:  PTA - independent from home with husband.  Home O2 through [employer].  CM will continue to monitor for disposition needs   Expected Discharge Date:  10/05/15               Expected Discharge Plan:  Home/Self Care  In-House Referral:     Discharge planning Services  CM Consult  Post Acute Care Choice:    Choice offered to:  Patient  DME Arranged:    DME Agency:     HH Arranged:    [place] Agency:     Status of Service:  In process, will continue to follow  Medicare Important Message Given:    Date Medicare IM Given:    Medicare IM give by:    Date Additional Medicare IM Given:    Additional Medicare Important Message give by:     If discussed at [place] of Stay Meetings, dates discussed:    Additional Comments:  Maryclare Labrador, RN 10/04/2015, 2:43 PM

## 2015-10-05 MED ORDER — OXYCODONE HCL 5 MG PO TABS: 5.0000 mg | ORAL_TABLET | ORAL | Status: DC | PRN

## 2015-10-05 MED ORDER — PREDNISONE 20 MG PO TABS
30.0000 mg | ORAL_TABLET | Freq: Every day | ORAL | Status: DC
Start: 2015-10-05 — End: 2015-10-09

## 2015-10-05 NOTE — Progress Notes (Signed)
Patient discharged to home with instructions. 

## 2015-10-05 NOTE — Discharge Summary (Signed)
Physician Discharge Summary  Patient ID: JENENE BARTZ MRN: [id number] DOB/AGE: March 03, 1951 65 y.o.  Admit date: 10/01/2015 Discharge date: 10/05/2015  Admission Diagnoses:s/p hernia repair  Discharge Diagnoses:  Active Problems:   S/P hernia repair   Hypoxia   Shortness of breath   Acute respiratory failure with hypoxia (HCC)   COPD exacerbation (HCC)   Discharged Condition: good  Hospital Course: Pt was admitted post op.  Upon post op check pt with hypoxia likely related to narcs.  Narcs reversed.  Pt with some dyspnea.  She was transferred to SDU and CCM was consulted. She was also placed on dilatiazem gtt for Afib.  She was started on her home lopressor and elliquis. She eventually conceverted to sinus rhythm. With BiPap and steroids pt eventually recovered fromher hypoxia.  SHe was tolerating a reg diet.  + flatus.  Pt was stable and deemed stable for DC.   Consults: pulmonary/intensive care  Significant Diagnostic Studies: Echo  Treatments: surgery: as above and breathing txs/BiPap  Discharge Exam: Blood pressure 124/68, pulse 92, temperature 97.7 F (36.5 C), temperature source Oral, resp. rate 18, height 5\' 7"  (1.702 m), weight 92.3 kg (203 lb 7.8 oz), SpO2 97 %. General appearance: alert and cooperative Resp: clear to auscultation bilaterally Cardio: regular rate and rhythm, S1, S2 normal, no murmur, click, rub or gallop GI: soft, non-tender; bowel sounds normal; no masses,  no organomegaly  Disposition: 01-Home or Self Care  Discharge Instructions    Diet - low sodium heart healthy    Complete by:  As directed      Increase activity slowly    Complete by:  As directed             Medication List    TAKE these medications        albuterol 108 (90 Base) MCG/ACT inhaler  Commonly known as:  PROVENTIL HFA;VENTOLIN HFA  Inhale 2 puffs into the lungs every 6 (six) hours as needed for wheezing or shortness of breath (shortness of breath).     apixaban 5 MG Tabs  tablet  Commonly known as:  ELIQUIS  Take 1 tablet (5 mg total) by mouth 2 (two) times daily.     diphenhydramine-acetaminophen 25-500 MG Tabs tablet  Commonly known as:  TYLENOL PM  Take 2 tablets by mouth daily as needed (for sleep).     DULERA 200-5 MCG/ACT Aero  Generic drug:  mometasone-formoterol  2 PUFFS FIRST THING IN AM AND THEN ANOTHER 2 PUFFS ABOUT 12 HOURS LATER     escitalopram 10 MG tablet  Commonly known as:  LEXAPRO  Take 1 tablet (10 mg total) by mouth at bedtime.     furosemide 20 MG tablet  Commonly known as:  LASIX  Take 1 tablet (20 mg total) by mouth every morning.     metoprolol 50 MG tablet  Commonly known as:  LOPRESSOR  Take 100 mg(2 tablets) by mouth in the am and 75 mg(1 1/2 tablets) by mouth in the pm     oxyCODONE 5 MG immediate release tablet  Commonly known as:  Oxy IR/ROXICODONE  Take 1-2 tablets (5-10 mg total) by mouth every 4 (four) hours as needed for moderate pain.     potassium chloride 10 MEQ tablet  Commonly known as:  K-DUR  Take 1 tablet (10 mEq total) by mouth daily.     predniSONE 20 MG tablet  Commonly known as:  DELTASONE  Take 1.5 tablets (30 mg total) by mouth daily  with breakfast.     simvastatin 20 MG tablet  Commonly known as:  ZOCOR  Take 1 tablet (20 mg total) by mouth every evening.     tiotropium 18 MCG inhalation capsule  Commonly known as:  SPIRIVA  Place 1 capsule (18 mcg total) into inhaler and inhale every morning.           Follow-up Information    Follow up with Reyes Ivan, MD. Schedule an appointment as soon as possible for a visit in 2 weeks.   Specialty:  General Surgery   Why:  For wound re-check   Contact information:   [facility]  [id number] [phone]       Signed: Rosario Jacks., Anne Hahn 10/05/2015, 6:45 AM

## 2015-10-08 ENCOUNTER — Other Ambulatory Visit: Payer: Self-pay | Admitting: *Deleted

## 2015-10-08 MED ORDER — SIMVASTATIN 20 MG PO TABS: 20.0000 mg | ORAL_TABLET | Freq: Every evening | ORAL | Status: DC

## 2015-10-09 ENCOUNTER — Encounter: Payer: Self-pay | Admitting: Internal Medicine

## 2015-10-09 ENCOUNTER — Ambulatory Visit (INDEPENDENT_AMBULATORY_CARE_PROVIDER_SITE_OTHER): Payer: Medicaid Other | Admitting: Internal Medicine

## 2015-10-09 VITALS — BP 128/82 | HR 79 | Ht 67.0 in | Wt 195.0 lb

## 2015-10-09 DIAGNOSIS — J449 Chronic obstructive pulmonary disease, unspecified: Secondary | ICD-10-CM | POA: Diagnosis not present

## 2015-10-09 DIAGNOSIS — J441 Chronic obstructive pulmonary disease with (acute) exacerbation: Secondary | ICD-10-CM

## 2015-10-09 MED ORDER — LEVOFLOXACIN 500 MG PO TABS: 500.0000 mg | ORAL_TABLET | Freq: Every day | ORAL | Status: DC

## 2015-10-09 NOTE — Progress Notes (Signed)
Subjective:     Patient ID: Allison [name], female   DOB: 1950-09-28, 65 y.o.   MRN: [id number]  HPI 65 yo female former smoker with severe CODP  Metastatic renal cancer (Nephrectomy 01/2014)   07/26/15  Follow up : Severe COPD  Pt returns for 2 month follow up for COPD and PFT results.  Pt. states breathing is baseline. SOB with activity at baseline with minimally productive cough with clear mucus.  Denies wheezing or chest pain/tightness. No hemoptysis , orthopnea or edema.  Remains on Dulera and Spiriva .  She is on Oxygen 2 l/m at bedtime . PFT done today results reviewed with pt - shows Post BD FEV1 at 40%, ratio 50 , FVC 62%, DLCO 46%, +BD response. (Similar to 2014 ) .  Pt has metastatic renal cancer to lung . Most recent CT chest shows stable disease in lung w/ no new lung lesions. No adenopathy.  Recent cardiac stress test with no ischemia noted in 06/2015 . Old defect noted     OV 10/09/2015  Chief Complaint  Patient presents with  . Follow-up    Pt states she just had an umbilical hernia repair on 10/01/15. Pt states she had to use bipap after surgery. Pt c/o DOE, prod cough with tan mucus.     Follow-up severe COPD. Status post umbilical hernia repair [id number]. Review of the chart shows operative course was complicated by respiratory failure and atrial fibrillation. She needed BiPAP. She also needed steroids. Review of labs from 10/03/2015 shows creatinine 1.37 mg percent and hemoglobin 12 g percent. She also had CT anginal chest that ruled out pulmonary embolism and metastatic renal cancer to the lung was stable. She was seen by the pulmonary service according to chart review. She is now feeling improved but she still feels she has yellow colored sputum and this has not cleared up. She feels she is a resident of an exacerbation and is requesting antibiotics.   Anti-infectives    None      Allergies  Allergen Reactions  . Ace Inhibitors Cough  . Amoxicillin-Pot Clavulanate  Hives  . Codeine Nausea And Vomiting       has a past medical history of Tobacco abuse; Coronary artery disease; Hyperlipidemia; Ischemic cardiomyopathy; A-fib ([place]); Metastatic renal cell carcinoma to lung ([place]) (01/29/2014); Acute pyelonephritis (01/28/2014); Hypertension; Pneumonia; Anxiety; History of kidney stones; Lung nodule; PVD (peripheral vascular disease) ([place]); Carotid disease, bilateral ([place]); Shortness of breath dyspnea; Dysrhythmia; and COPD (chronic obstructive pulmonary disease) ([place]).   reports that she quit smoking about 6 years ago. Her smoking use included Cigarettes. She has a 45 pack-year smoking history. She has never used smokeless tobacco.  Past Surgical History  Procedure Laterality Date  . Coronary stent placement  2011  . Femoral bypass Left May 2012  . Lower extremity angiogram  December 16, 2011  . Cardiac catheterization    . Tonsillectomy  65 years old  . Robot assisted laparoscopic nephrectomy Left 03/22/2014    Procedure: ROBOTIC ASSISTED LAPAROSCOPIC RADICAL NEPHRECTOMY LEFT;  Surgeon: Alexis Frock, MD;  Location: [facility];  Service: Urology;  Laterality: Left;  . Cardioversion N/A 05/08/2014    Procedure: CARDIOVERSION;  Surgeon: Thayer Headings, MD;  Location: [facility];  Service: Cardiovascular;  Laterality: N/A;  . Lower extremity angiogram N/A 12/16/2011    Procedure: LOWER EXTREMITY ANGIOGRAM;  Surgeon: Serafina Mitchell, MD;  Location: [facility] CATH LAB;  Service: Cardiovascular;  Laterality: N/A;  . Incisional hernia repair N/A  10/01/2015    Procedure: INCISIONAL HERNIA REPAIR;  Surgeon: Ralene Ok, MD;  Location: [facility];  Service: General;  Laterality: N/A;  . Insertion of mesh N/A 10/01/2015    Procedure: INSERTION OF MESH;  Surgeon: Ralene Ok, MD;  Location: [facility];  Service: General;  Laterality: N/A;    Allergies  Allergen Reactions  . Ace Inhibitors Cough  . Amoxicillin-Pot Clavulanate Hives  . Codeine Nausea And Vomiting    Immunization  History  Administered Date(s) Administered  . Influenza Split 06/12/2013  . Influenza,inj,Quad PF,36+ Mos 03/23/2014, 05/25/2015  . Pneumococcal Polysaccharide-23 07/08/2007, 11/16/2012, 03/23/2014    Family History  Problem Relation Age of Onset  . Coronary artery disease Mother     deceased. coronary artery bypass graft  . Cancer Mother     BREAST  . Heart disease Mother     PVD  - Amputation-Leg  . Hyperlipidemia Mother   . Hypertension Mother   . Heart attack Mother   . Cirrhosis Father     deceased  . Cancer Father   . Other Brother     alive, unknown hx  . Stroke Maternal Aunt      Current outpatient prescriptions:  .  albuterol (PROVENTIL HFA;VENTOLIN HFA) 108 (90 Base) MCG/ACT inhaler, Inhale 2 puffs into the lungs every 6 (six) hours as needed for wheezing or shortness of breath (shortness of breath)., Disp: 1 Inhaler, Rfl: 3 .  apixaban (ELIQUIS) 5 MG TABS tablet, Take 1 tablet (5 mg total) by mouth 2 (two) times daily., Disp: 180 tablet, Rfl: 1 .  diphenhydramine-acetaminophen (TYLENOL PM) 25-500 MG TABS, Take 2 tablets by mouth daily as needed (for sleep). , Disp: , Rfl:  .  DULERA 200-5 MCG/ACT AERO, 2 PUFFS FIRST THING IN AM AND THEN ANOTHER 2 PUFFS ABOUT 12 HOURS LATER, Disp: 13 g, Rfl: 11 .  escitalopram (LEXAPRO) 10 MG tablet, Take 1 tablet (10 mg total) by mouth at bedtime., Disp: 90 tablet, Rfl: 2 .  furosemide (LASIX) 20 MG tablet, Take 1 tablet (20 mg total) by mouth every morning., Disp: 90 tablet, Rfl: 2 .  metoprolol (LOPRESSOR) 50 MG tablet, Take 100 mg(2 tablets) by mouth in the am and 75 mg(1 1/2 tablets) by mouth in the pm, Disp: 315 tablet, Rfl: 3 .  potassium chloride (K-DUR) 10 MEQ tablet, Take 1 tablet (10 mEq total) by mouth daily., Disp: 30 tablet, Rfl: 5 .  simvastatin (ZOCOR) 20 MG tablet, Take 1 tablet (20 mg total) by mouth every evening., Disp: 90 tablet, Rfl: 1 .  tiotropium (SPIRIVA) 18 MCG inhalation capsule, Place 1 capsule (18 mcg  total) into inhaler and inhale every morning., Disp: 30 capsule, Rfl: 6 .  oxyCODONE (OXY IR/ROXICODONE) 5 MG immediate release tablet, Take 1-2 tablets (5-10 mg total) by mouth every 4 (four) hours as needed for moderate pain. (Patient not taking: Reported on 10/09/2015), Disp: 30 tablet, Rfl: 0    Review of Systems     Objective:   Physical Exam  Filed Vitals:   10/09/15 0952  BP: 128/82  Pulse: 79  Height: 5\' 7"  (1.702 m)  Weight: 195 lb (88.451 kg)  SpO2: 98%   General examm" A bit more deconditioned than in the past Psychiatry: Pleasant Central nervous system: Alert and oriented 3. Speech normal. Gait normal. Moves  all 4 extremities. Cardiovascular: Normal heart sounds. No murmurs Respiratory: No wheeze crackles. No respiratory distress entry equal on both sides Abdomen: Surgical incision scar present somewhat tender no  pus drainage Extremities: No cyanosis no clubbing no edema Skin exam: Intact      Assessment:       ICD-9-CM ICD-10-CM   1. COPD exacerbation ([place]) 491.21 J44.1   2. COPD, severe - MS phenotype 496 J44.9        Plan:      There probably is some mild residual ongoing COPD exacerbation  Plan - Levaquin 500 milligrams daily 5 days - Continue scheduled bronchodilator therapy  Follow-up  - 4 months or sooner if needed  Dr. Brand Males, M.D., [facility].C.P Pulmonary and Critical Care Medicine Staff Physician [place] [facility] Pager: [phone], If no answer or between  15:00h - 7:00h: call [phone]  10/09/2015 10:12 AM

## 2015-10-09 NOTE — Patient Instructions (Addendum)
ICD-9-CM ICD-10-CM   1. COPD exacerbation ([place]) 491.21 J44.1   2. COPD, severe - MS phenotype 496 J44.9     There probably is some mild residual ongoing COPD exacerbation  Plan - Levaquin 500 milligrams daily 5 days - Continue scheduled bronchodilator therapy  Follow-up  - 4 months or sooner if needed

## 2015-11-27 ENCOUNTER — Encounter (HOSPITAL_COMMUNITY): Payer: Self-pay

## 2015-11-27 ENCOUNTER — Other Ambulatory Visit (HOSPITAL_BASED_OUTPATIENT_CLINIC_OR_DEPARTMENT_OTHER): Payer: Medicaid Other

## 2015-11-27 ENCOUNTER — Ambulatory Visit (HOSPITAL_COMMUNITY)
Admission: RE | Admit: 2015-11-27 | Discharge: 2015-11-27 | Disposition: A | Discharge status: Home / Self Care | Payer: Medicaid Other | Source: Ambulatory Visit | Attending: Oncology | Admitting: Oncology

## 2015-11-27 DIAGNOSIS — C7800 Secondary malignant neoplasm of unspecified lung: Secondary | ICD-10-CM | POA: Insufficient documentation

## 2015-11-27 DIAGNOSIS — I2510 Atherosclerotic heart disease of native coronary artery without angina pectoris: Secondary | ICD-10-CM | POA: Insufficient documentation

## 2015-11-27 DIAGNOSIS — C642 Malignant neoplasm of left kidney, except renal pelvis: Secondary | ICD-10-CM | POA: Diagnosis not present

## 2015-11-27 DIAGNOSIS — C649 Malignant neoplasm of unspecified kidney, except renal pelvis: Secondary | ICD-10-CM | POA: Diagnosis present

## 2015-11-27 LAB — COMPREHENSIVE METABOLIC PANEL
ALT: <9 U/L (ref 0–55)
ANION GAP: 9 meq/L (ref 3–11)
AST: 13 U/L (ref 5–34)
Albumin: 3.4 g/dL — ABNORMAL LOW (ref 3.5–5.0)
Alkaline Phosphatase: 110 U/L (ref 40–150)
BILIRUBIN TOTAL: 0.32 mg/dL (ref 0.20–1.20)
BUN: 15.5 mg/dL (ref 7.0–26.0)
CHLORIDE: 107 meq/L (ref 98–109)
CO2: 25 meq/L (ref 22–29)
Calcium: 9.9 mg/dL (ref 8.4–10.4)
Creatinine: 1.1 mg/dL (ref 0.6–1.1)
EGFR: 54 mL/min/{1.73_m2} — AB (ref >90)
Glucose: 103 mg/dl (ref 70–140)
Potassium: 3.9 mEq/L (ref 3.5–5.1)
Sodium: 141 mEq/L (ref 136–145)
TOTAL PROTEIN: 7.6 g/dL (ref 6.4–8.3)

## 2015-11-27 LAB — CBC WITH DIFFERENTIAL/PLATELET
BASO%: 0.9 % (ref 0.0–2.0)
Basophils Absolute: 0.1 10*3/uL (ref 0.0–0.1)
EOS ABS: 0.2 10*3/uL (ref 0.0–0.5)
EOS%: 1.8 % (ref 0.0–7.0)
HCT: 39.9 % (ref 34.8–46.6)
HGB: 13.1 g/dL (ref 11.6–15.9)
LYMPH%: 21.2 % (ref 14.0–49.7)
MCH: 28.9 pg (ref 25.1–34.0)
MCHC: 32.7 g/dL (ref 31.5–36.0)
MCV: 88.3 fL (ref 79.5–101.0)
MONO#: 0.7 10*3/uL (ref 0.1–0.9)
MONO%: 6.5 % (ref 0.0–14.0)
NEUT#: 7.6 10*3/uL — ABNORMAL HIGH (ref 1.5–6.5)
NEUT%: 69.6 % (ref 38.4–76.8)
PLATELETS: 286 10*3/uL (ref 145–400)
RBC: 4.52 10*6/uL (ref 3.70–5.45)
RDW: 15.9 % — ABNORMAL HIGH (ref 11.2–14.5)
WBC: 11 10*3/uL — ABNORMAL HIGH (ref 3.9–10.3)
lymph#: 2.3 10*3/uL (ref 0.9–3.3)

## 2015-11-27 MED ORDER — IOPAMIDOL (ISOVUE-300) INJECTION 61%
75.0000 mL | Freq: Once | INTRAVENOUS | Status: AC | PRN
  Administered 2015-11-27: 75 mL via INTRAVENOUS

## 2015-11-29 ENCOUNTER — Ambulatory Visit (HOSPITAL_BASED_OUTPATIENT_CLINIC_OR_DEPARTMENT_OTHER): Payer: Medicaid Other | Admitting: Oncology

## 2015-11-29 ENCOUNTER — Telehealth: Payer: Self-pay | Admitting: Oncology

## 2015-11-29 VITALS — BP 145/72 | HR 94 | Temp 98.1°F | Resp 18 | Ht 67.0 in | Wt 192.6 lb

## 2015-11-29 DIAGNOSIS — C642 Malignant neoplasm of left kidney, except renal pelvis: Secondary | ICD-10-CM

## 2015-11-29 DIAGNOSIS — R918 Other nonspecific abnormal finding of lung field: Secondary | ICD-10-CM | POA: Diagnosis not present

## 2015-11-29 DIAGNOSIS — C7800 Secondary malignant neoplasm of unspecified lung: Principal | ICD-10-CM

## 2015-11-29 DIAGNOSIS — C649 Malignant neoplasm of unspecified kidney, except renal pelvis: Secondary | ICD-10-CM

## 2015-11-29 NOTE — Progress Notes (Signed)
Hematology and Oncology Follow Up Visit  Allison [name] [id number] [birth year] 65 y.o. 11/29/2015 9:37 AM [facility] [place] [place] *   Principle Diagnosis: 65 year old woman with renal cell carcinoma diagnosed in July 2015. She presented with a 6.4 cm left kidney mass and pulmonary nodules. She has likely stage IV disease.   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   Current therapy: Observation surveillance.  Interim History: Allison [name] presents today for a follow-up visit. Since the last visit, she was hospitalized and not March 2017 for respiratory failure after hernia repair operation. She recovered fairly well and since her discharge, she reports no recent complaints. She continues to have dyspnea on exertion which is chronic in nature. She does use nighttime oxygen and rarely uses it at the time. She is able to ambulate without any difficulties. Has not reported any increase in her cough or hemoptysis. Has not reported any weight loss or constitutional symptoms. She continued to have reasonable performance status at this time. Her quality of life or not dramatically changed.   She does not report any headaches blurred vision or double vision or syncope. She does not report any fevers, chills, sweats or weight loss.. She does not report any chest pain, palpitation, orthopnea, PND or leg edema. She does not report any nausea, vomiting, obstipation, diarrhea, hematochezia or melena. She does not report any hematuria, dysuria or frequency. She does not report any skeletal complaints such as arthralgias or myalgias or joint swelling. She does not report any lymphadenopathy or petechiae.  Rest of her review of systems unremarkable.  Medications: I have reviewed the patient's current medications.  Current Outpatient Prescriptions  Medication Sig Dispense  Refill  . albuterol (PROVENTIL HFA;VENTOLIN HFA) 108 (90 Base) MCG/ACT inhaler Inhale 2 puffs into the lungs every 6 (six) hours as needed for wheezing or shortness of breath (shortness of breath). 1 Inhaler 3  . apixaban (ELIQUIS) 5 MG TABS tablet Take 1 tablet (5 mg total) by mouth 2 (two) times daily. 180 tablet 1  . diphenhydramine-acetaminophen (TYLENOL PM) 25-500 MG TABS Take 2 tablets by mouth daily as needed (for sleep).     . DULERA 200-5 MCG/ACT AERO 2 PUFFS FIRST THING IN AM AND THEN ANOTHER 2 PUFFS ABOUT 12 HOURS LATER 13 g 11  . escitalopram (LEXAPRO) 10 MG tablet Take 1 tablet (10 mg total) by mouth at bedtime. 90 tablet 2  . furosemide (LASIX) 20 MG tablet Take 1 tablet (20 mg total) by mouth every morning. 90 tablet 2  . metoprolol (LOPRESSOR) 50 MG tablet Take 100 mg(2 tablets) by mouth in the am and 75 mg(1 1/2 tablets) by mouth in the pm 315 tablet 3  . oxyCODONE (OXY IR/ROXICODONE) 5 MG immediate release tablet Take 1-2 tablets (5-10 mg total) by mouth every 4 (four) hours as needed for moderate pain. 30 tablet 0  . potassium chloride (K-DUR) 10 MEQ tablet Take 1 tablet (10 mEq total) by mouth daily. 30 tablet 5  . simvastatin (ZOCOR) 20 MG tablet Take 1 tablet (20 mg total) by mouth every evening. 90 tablet 1  . tiotropium (SPIRIVA) 18 MCG inhalation capsule Place 1 capsule (18 mcg total) into inhaler and inhale every morning. 30 capsule 6   No current facility-administered medications for this visit.     Allergies:  Allergies  Allergen Reactions  . Ace Inhibitors Cough  .  Amoxicillin-Pot Clavulanate Hives  . Codeine Nausea And Vomiting    Past Medical History, Surgical history, Social history, and Family History were reviewed and updated.   Physical Exam: Blood pressure 145/72, pulse 94, temperature 98.1 F (36.7 C), temperature source Oral, resp. rate 18, height 5\' 7"  (1.702 m), weight 192 lb 9.6 oz (87.363 kg), SpO2 99 %. ECOG: 1 General appearance: , Awake  woman without distress. Head: Normocephalic, without obvious abnormality no oral ulcers or lesions. Neck: no adenopathy Lymph nodes: Cervical, supraclavicular, and axillary nodes normal. Heart:regular rate and rhythm, S1, S2 normal, no murmur, click, rub or gallop Lung: Expiratory wheezing noted bilaterally. Good breath sounds otherwise. No dullness to percussion. Abdomin: soft, non-tender, without masses or organomegaly no rebound or guarding. EXT:no erythema, induration, or nodules   Lab Results: Lab Results  Component Value Date   WBC 11.0* 11/27/2015   HGB 13.1 11/27/2015   HCT 39.9 11/27/2015   MCV 88.3 11/27/2015   PLT 286 11/27/2015     Chemistry      Component Value Date/Time   NA 141 11/27/2015 0921   NA 135 10/03/2015 0250   K 3.9 11/27/2015 0921   K 4.0 10/03/2015 0250   CL 101 10/03/2015 0250   CO2 25 11/27/2015 0921   CO2 23 10/03/2015 0250   BUN 15.5 11/27/2015 0921   BUN 18 10/03/2015 0250   CREATININE 1.1 11/27/2015 0921   CREATININE 1.37* 10/03/2015 0250      Component Value Date/Time   CALCIUM 9.9 11/27/2015 0921   CALCIUM 8.8* 10/03/2015 0250   ALKPHOS 110 11/27/2015 0921   ALKPHOS 118* 02/12/2015 1019   AST 13 11/27/2015 0921   AST 14 02/12/2015 1019   ALT <9 11/27/2015 0921   ALT 11 02/12/2015 1019   BILITOT 0.32 11/27/2015 0921   BILITOT 0.3 02/12/2015 1019      EXAM: CT CHEST WITH CONTRAST  TECHNIQUE: Multidetector CT imaging of the chest was performed during intravenous contrast administration.  CONTRAST: 46mL ISOVUE-300 IOPAMIDOL (ISOVUE-300) INJECTION 61%  COMPARISON: 10/02/2015  FINDINGS: Mediastinum/Nodes: No supraclavicular adenopathy. Aortic and branch vessel atherosclerosis. Borderline cardiomegaly, without pericardial effusion. Multivessel coronary artery atherosclerosis. No central pulmonary embolism, on this non-dedicated study.  No mediastinal or hilar adenopathy.  Lungs/Pleura: No pleural fluid. Moderate  centrilobular emphysema.  Right lower lobe pulmonary nodule which measures 1.9 cm on image 75/series 4. This is similar, (when remeasured).  Index more posterior right lower lobe pulmonary nodule measures 8 mm on image 68/series 5 and is unchanged (when remeasured).  An index left upper lobe pulmonary nodule measures 1.5 cm on image 42/series 5 versus 1.6 cm on the prior exam (when remeasured).  Upper abdomen: Normal imaged portions of the liver, spleen, stomach, pancreas, gallbladder, adrenal glands. Normal imaged right kidney. Left nephrectomy. Abdominal aortic atherosclerosis.  Musculoskeletal: No acute osseous abnormality.  IMPRESSION: 1. Since 10/02/2015, similar pulmonary metastasis. 2. No thoracic adenopathy. 3. Age advanced coronary artery atherosclerosis. Recommend assessment of coronary risk factors and consideration of medical therapy.    Impression and Plan:   65 year old woman with the following issues:  1. Renal cell carcinoma diagnosed in July 2015. She presented with a 6.4 cm left kidney mass. She is status post left nephrectomy done in September 2015 without any major complications. The pathology revealed a T3a lesion without any local lymphadenopathy. She does have bilateral pulmonary nodules that are suspicious for malignancy.   2. Bilateral pulmonary nodules: The differential diagnosis including other possibilities as well such as  infectious or inflammatory etiologies. This is most likely represents metastatic renal cell carcinoma and to the lung.  CT scan on 11/27/2015 was personally reviewed today and discussed with the patient. His pulmonary nodules appear to be relatively stable without any new lesions noted.  Options of management at this time were reviewed again. Systemic therapy, biopsy, surgical resection or observation were all discussed today. And for the time being we have elected to continue with observation surveillance. Given her poor  pulmonary status continued observation and repeat imaging studies in 6 months would be the way to go at this time. If these pulmonary nodules start to grow, biopsy and potential systemic therapy would be a consideration at that time.  3. Follow-up: Will be in November 2017 with a repeat imaging studies.    [facility], MD 5/25/20179:37 AM

## 2015-11-29 NOTE — Telephone Encounter (Signed)
per pof to sch pt appt-adv central sch will call to sch-gave pt copy of avs

## 2016-01-01 ENCOUNTER — Emergency Department (HOSPITAL_COMMUNITY)
Admission: EM | Admit: 2016-01-01 | Discharge: 2016-01-01 | Disposition: A | Discharge status: Home / Self Care | Payer: Medicaid Other | Attending: Emergency Medicine | Admitting: Emergency Medicine

## 2016-01-01 ENCOUNTER — Emergency Department (HOSPITAL_COMMUNITY): Payer: Medicaid Other

## 2016-01-01 ENCOUNTER — Encounter (HOSPITAL_COMMUNITY): Payer: Self-pay | Admitting: Emergency Medicine

## 2016-01-01 DIAGNOSIS — R42 Dizziness and giddiness: Secondary | ICD-10-CM | POA: Diagnosis present

## 2016-01-01 DIAGNOSIS — Z79899 Other long term (current) drug therapy: Secondary | ICD-10-CM | POA: Diagnosis not present

## 2016-01-01 DIAGNOSIS — I4891 Unspecified atrial fibrillation: Secondary | ICD-10-CM | POA: Diagnosis not present

## 2016-01-01 DIAGNOSIS — Z87891 Personal history of nicotine dependence: Secondary | ICD-10-CM | POA: Insufficient documentation

## 2016-01-01 DIAGNOSIS — E785 Hyperlipidemia, unspecified: Secondary | ICD-10-CM | POA: Insufficient documentation

## 2016-01-01 DIAGNOSIS — J449 Chronic obstructive pulmonary disease, unspecified: Secondary | ICD-10-CM | POA: Insufficient documentation

## 2016-01-01 DIAGNOSIS — I10 Essential (primary) hypertension: Secondary | ICD-10-CM | POA: Insufficient documentation

## 2016-01-01 DIAGNOSIS — I2510 Atherosclerotic heart disease of native coronary artery without angina pectoris: Secondary | ICD-10-CM | POA: Diagnosis not present

## 2016-01-01 DIAGNOSIS — I739 Peripheral vascular disease, unspecified: Secondary | ICD-10-CM | POA: Diagnosis not present

## 2016-01-01 LAB — CBC WITH DIFFERENTIAL/PLATELET
BASOS PCT: 0 %
Basophils Absolute: 0 10*3/uL (ref 0.0–0.1)
EOS ABS: 0.2 10*3/uL (ref 0.0–0.7)
Eosinophils Relative: 1 %
HEMATOCRIT: 41.5 % (ref 36.0–46.0)
HEMOGLOBIN: 13.5 g/dL (ref 12.0–15.0)
LYMPHS ABS: 1.6 10*3/uL (ref 0.7–4.0)
Lymphocytes Relative: 12 %
MCH: 29.2 pg (ref 26.0–34.0)
MCHC: 32.5 g/dL (ref 30.0–36.0)
MCV: 89.6 fL (ref 78.0–100.0)
Monocytes Absolute: 0.9 10*3/uL (ref 0.1–1.0)
Monocytes Relative: 7 %
NEUTROS PCT: 80 %
Neutro Abs: 10.5 10*3/uL — ABNORMAL HIGH (ref 1.7–7.7)
Platelets: 290 10*3/uL (ref 150–400)
RBC: 4.63 MIL/uL (ref 3.87–5.11)
RDW: 14.3 % (ref 11.5–15.5)
WBC: 13.2 10*3/uL — AB (ref 4.0–10.5)

## 2016-01-01 LAB — COMPREHENSIVE METABOLIC PANEL
ALBUMIN: 3.8 g/dL (ref 3.5–5.0)
ALK PHOS: 114 U/L (ref 38–126)
ALT: 11 U/L — AB (ref 14–54)
AST: 17 U/L (ref 15–41)
Anion gap: 3 — ABNORMAL LOW (ref 5–15)
BILIRUBIN TOTAL: 0.4 mg/dL (ref 0.3–1.2)
BUN: 16 mg/dL (ref 6–20)
CALCIUM: 9.3 mg/dL (ref 8.9–10.3)
CO2: 30 mmol/L (ref 22–32)
CREATININE: 0.91 mg/dL (ref 0.44–1.00)
Chloride: 103 mmol/L (ref 101–111)
GFR calc Af Amer: >60 mL/min (ref >60)
GFR calc non Af Amer: >60 mL/min (ref >60)
GLUCOSE: 107 mg/dL — AB (ref 65–99)
Potassium: 3.7 mmol/L (ref 3.5–5.1)
SODIUM: 136 mmol/L (ref 135–145)
TOTAL PROTEIN: 7.6 g/dL (ref 6.5–8.1)

## 2016-01-01 MED ORDER — SODIUM CHLORIDE 0.9 % IV BOLUS (SEPSIS)
1000.0000 mL | Freq: Once | INTRAVENOUS | Status: AC
  Administered 2016-01-01: 1000 mL via INTRAVENOUS

## 2016-01-01 MED ORDER — ONDANSETRON HCL 4 MG/2ML IJ SOLN
4.0000 mg | Freq: Once | INTRAMUSCULAR | Status: AC
  Administered 2016-01-01: 4 mg via INTRAVENOUS
  Filled 2016-01-01: qty 2

## 2016-01-01 MED ORDER — MECLIZINE HCL 12.5 MG PO TABS
25.0000 mg | ORAL_TABLET | Freq: Once | ORAL | Status: AC
  Administered 2016-01-01: 25 mg via ORAL
  Filled 2016-01-01: qty 2

## 2016-01-01 MED ORDER — MECLIZINE HCL 25 MG PO TABS: 25.0000 mg | ORAL_TABLET | Freq: Three times a day (TID) | ORAL | Status: DC | PRN

## 2016-01-01 NOTE — ED Notes (Signed)
Pt states she has been experiencing dizziness with head movement for 2 weeks.  States she has been taking antihistamines with improvement in symptoms, but today washed her hair and became dizzy again.

## 2016-01-01 NOTE — ED Provider Notes (Signed)
CSN: [id number]     Arrival date & time 01/01/16  1547 History   First MD Initiated Contact with Patient 01/01/16 1639     Chief Complaint  Patient presents with  . Dizziness     (Consider location/radiation/quality/duration/timing/severity/associated sxs/prior Treatment) Patient is a 65 y.o. female presenting with dizziness. The history is provided by the patient (Patient complains of dizziness. She states that the room is spinning).  Dizziness Quality:  Head spinning Severity:  Moderate Onset quality:  Sudden Timing:  Intermittent Progression:  Waxing and waning Chronicity:  New Context: not when bending over   Associated symptoms: no chest pain, no diarrhea and no headaches     Past Medical History  Diagnosis Date  . Tobacco abuse     hx of  . Coronary artery disease     a. 08/2009 NSTEMI s/p PCI/DES to LCX. Mod residual LAD dzs;  b. 02/2014 Nonischemic myoview.  . Hyperlipidemia   . Ischemic cardiomyopathy     a. Echo 08/14/09 with LVEF 40-45%;  b. 01/2014 Echo: EF 55-60%, basal-mid inflat and inf HK, triv MR/TR, mod dil LA/RA.  Marland Kitchen A-fib ([place])     a. Dx 01/2014 - rate controlled. b. Recurrence 03/2014 in setting of renal cell carcinoma surgery.  . Metastatic renal cell carcinoma to lung ([place]) 01/29/2014  . Acute pyelonephritis 01/28/2014  . Hypertension   . Pneumonia     hx of  . Anxiety   . History of kidney stones   . Lung nodule     a. 1cm RLL nodule - followed by pulmonology.  Marland Kitchen PVD (peripheral vascular disease) ([place])     a. 11/2010 L Fem to above knee Pop bypass;  b. 12/2011 Angio: patent L fem->pop graft, LCFZ 50-60%.  . Carotid disease, bilateral ([place])     a. 11/2011 U/S: RICA 123XX123, LICA [id number].  Marland Kitchen Shortness of breath dyspnea     with exertion  . Dysrhythmia     A-Fib  . COPD (chronic obstructive pulmonary disease) ([place])     uses O2 at night   Past Surgical History  Procedure Laterality Date  . Coronary stent placement  2011  . Femoral bypass Left May 2012  .  Lower extremity angiogram  December 16, 2011  . Cardiac catheterization    . Tonsillectomy  65 years old  . Robot assisted laparoscopic nephrectomy Left 03/22/2014    Procedure: ROBOTIC ASSISTED LAPAROSCOPIC RADICAL NEPHRECTOMY LEFT;  Surgeon: Alexis Frock, MD;  Location: [facility];  Service: Urology;  Laterality: Left;  . Cardioversion N/A 05/08/2014    Procedure: CARDIOVERSION;  Surgeon: Thayer Headings, MD;  Location: [place];  Service: Cardiovascular;  Laterality: N/A;  . Lower extremity angiogram N/A 12/16/2011    Procedure: LOWER EXTREMITY ANGIOGRAM;  Surgeon: Serafina Mitchell, MD;  Location: [facility] CATH LAB;  Service: Cardiovascular;  Laterality: N/A;  . Incisional hernia repair N/A 10/01/2015    Procedure: INCISIONAL HERNIA REPAIR;  Surgeon: Ralene Ok, MD;  Location: [place];  Service: General;  Laterality: N/A;  . Insertion of mesh N/A 10/01/2015    Procedure: INSERTION OF MESH;  Surgeon: Ralene Ok, MD;  [facility];  Service: General;  Laterality: N/A;   Family History  Problem Relation Age of Onset  . Coronary artery disease Mother     deceased. coronary artery bypass graft  . Cancer Mother     BREAST  . Heart disease Mother     PVD  - Amputation-Leg  . Hyperlipidemia Mother   .  Hypertension Mother   . Heart attack Mother   . Cirrhosis Father     deceased  . Cancer Father   . Other Brother     alive, unknown hx  . Stroke Maternal Aunt    Social History  Substance Use Topics  . Smoking status: Former Smoker -- 1.50 packs/day for 30 years    Types: Cigarettes    Quit date: 08/12/2009  . Smokeless tobacco: Never Used     Comment: she has smoker 1-2 packs daily since the age of 6. 70 pack years  . Alcohol Use: No   OB History    No data available     Review of Systems  Constitutional: Negative for appetite change and fatigue.  HENT: Negative for congestion, ear discharge and sinus pressure.   Eyes: Negative for discharge.  Respiratory: Negative for cough.    Cardiovascular: Negative for chest pain.  Gastrointestinal: Negative for abdominal pain and diarrhea.  Genitourinary: Negative for frequency and hematuria.  Musculoskeletal: Negative for back pain.  Skin: Negative for rash.  Neurological: Positive for dizziness. Negative for seizures and headaches.  Psychiatric/Behavioral: Negative for hallucinations.      Allergies  Ace inhibitors; Amoxicillin-pot clavulanate; and Codeine  Home Medications   Prior to Admission medications   Medication Sig Start Date End Date Taking? Authorizing Provider  albuterol (PROVENTIL HFA;VENTOLIN HFA) 108 (90 Base) MCG/ACT inhaler Inhale 2 puffs into the lungs every 6 (six) hours as needed for wheezing or shortness of breath (shortness of breath). 07/26/15  Yes Tammy S Parrett, NP  apixaban (ELIQUIS) 5 MG TABS tablet Take 1 tablet (5 mg total) by mouth 2 (two) times daily. 08/02/15  Yes Burnell Blanks, MD  diphenhydramine-acetaminophen (TYLENOL PM) 25-500 MG TABS Take 2 tablets by mouth daily as needed (for sleep).    Yes Historical Provider, MD  DULERA 200-5 MCG/ACT AERO 2 PUFFS FIRST THING IN AM AND THEN ANOTHER 2 PUFFS ABOUT 12 HOURS LATER 03/19/15  Yes Brand Males, MD  escitalopram (LEXAPRO) 10 MG tablet Take 1 tablet (10 mg total) by mouth at bedtime. 05/15/15  Yes Burnell Blanks, MD  furosemide (LASIX) 20 MG tablet Take 1 tablet (20 mg total) by mouth every morning. 05/15/15  Yes Burnell Blanks, MD  metoprolol (LOPRESSOR) 50 MG tablet Take 100 mg(2 tablets) by mouth in the am and 75 mg(1 1/2 tablets) by mouth in the pm Patient taking differently: Take 75-100 mg by mouth 2 (two) times daily. Take 100 mg(2 tablets) by mouth in the am and 75 mg(1 1/2 tablets) by mouth in the pm 02/12/15  Yes Imogene Burn, PA-C  potassium chloride (K-DUR) 10 MEQ tablet Take 1 tablet (10 mEq total) by mouth daily. 08/02/15  Yes Burnell Blanks, MD  simvastatin (ZOCOR) 20 MG tablet Take 1 tablet  (20 mg total) by mouth every evening. 10/08/15  Yes Burnell Blanks, MD  tiotropium (SPIRIVA) 18 MCG inhalation capsule Place 1 capsule (18 mcg total) into inhaler and inhale every morning. 06/11/15  Yes Brand Males, MD  Triprolidine-Pseudoephedrine (ANTIHISTAMINE PO) Take 1-2 tablets by mouth daily as needed (for allergies-congestion).   Yes Historical Provider, MD  meclizine (ANTIVERT) 25 MG tablet Take 1 tablet (25 mg total) by mouth 3 (three) times daily as needed for dizziness. 01/01/16   Milton Ferguson, MD  oxyCODONE (OXY IR/ROXICODONE) 5 MG immediate release tablet Take 1-2 tablets (5-10 mg total) by mouth every 4 (four) hours as needed for moderate pain. Patient not  taking: Reported on 01/01/2016 10/05/15   Ralene Ok, MD   BP 154/80 mmHg  Pulse 95  Temp(Src) 97.6 F (36.4 C) (Oral)  Resp 11  Ht 5\' 7"  (1.702 m)  Wt 190 lb (86.183 kg)  BMI 29.75 kg/m2  SpO2 98% Physical Exam  Constitutional: She is oriented to person, place, and time. She appears well-developed.  HENT:  Head: Normocephalic.  Eyes: Conjunctivae and EOM are normal. No scleral icterus.  Neck: Neck supple. No thyromegaly present.  Cardiovascular: Normal rate and regular rhythm.  Exam reveals no gallop and no friction rub.   No murmur heard. Pulmonary/Chest: No stridor. She has no wheezes. She has no rales. She exhibits no tenderness.  Abdominal: She exhibits no distension. There is no tenderness. There is no rebound.  Musculoskeletal: Normal range of motion. She exhibits no edema.  Lymphadenopathy:    She has no cervical adenopathy.  Neurological: She is oriented to person, place, and time. She exhibits normal muscle tone. Coordination normal.  Skin: No rash noted. No erythema.  Psychiatric: She has a normal mood and affect. Her behavior is normal.    ED Course  Procedures (including critical care time) Labs Review Labs Reviewed  CBC WITH DIFFERENTIAL/PLATELET - Abnormal; Notable for the following:     WBC 13.2 (*)    Neutro Abs 10.5 (*)    All other components within normal limits  COMPREHENSIVE METABOLIC PANEL - Abnormal; Notable for the following:    Glucose, Bld 107 (*)    ALT 11 (*)    Anion gap 3 (*)    All other components within normal limits    Imaging Review Ct Head Wo Contrast  01/01/2016  CLINICAL DATA:  65 year old female with dizziness EXAM: CT HEAD WITHOUT CONTRAST TECHNIQUE: Contiguous axial images were obtained from the base of the skull through the vertex without intravenous contrast. COMPARISON:  Brain MRI dated 01/30/2014 FINDINGS: There is slight prominence of the ventricles and sulci compatible with age-related volume loss. Mild periventricular and deep white matter hypodensities represent chronic microvascular ischemic changes. There is no intracranial hemorrhage. No mass effect or midline shift identified. The visualized paranasal sinuses and mastoid air cells are well aerated. The calvarium is intact. IMPRESSION: No acute intracranial hemorrhage. Mild age-related atrophy and chronic microvascular ischemic disease. If symptoms persist and there are no contraindications, MRI may provide better evaluation if clinically indicated Electronically Signed   By: Anner Crete M.D.   On: 01/01/2016 18:22   I have personally reviewed and evaluated these images and lab results as part of my medical decision-making.   EKG Interpretation   Date/Time:  Tuesday January 01 2016 17:19:27 EDT Ventricular Rate:  93 PR Interval:    QRS Duration: 97 QT Interval:  382 QTC Calculation: 473 R Axis:   50 Text Interpretation:  Atrial fibrillation Abnormal inferior Q waves  Baseline wander in lead(s) I Confirmed by Keylin Podolsky  MD, Triton Heidrich [phone]) on  01/01/2016 6:33:29 PM      MDM   Final diagnoses:  Vertigo    Patient with vertigo which has been helped by Antivert. She will be sent home with Antivert and will follow up with her PCP    Milton Ferguson, MD 01/01/16 [phone]

## 2016-01-01 NOTE — Discharge Instructions (Signed)
Follow up with your md next week. °

## 2016-01-24 ENCOUNTER — Other Ambulatory Visit: Payer: Self-pay | Admitting: *Deleted

## 2016-01-24 MED ORDER — APIXABAN 5 MG PO TABS: 5.0000 mg | ORAL_TABLET | Freq: Two times a day (BID) | ORAL | Status: AC

## 2016-01-24 MED ORDER — ESCITALOPRAM OXALATE 10 MG PO TABS: 10.0000 mg | ORAL_TABLET | Freq: Every day | ORAL | Status: DC

## 2016-01-24 NOTE — Telephone Encounter (Signed)
OK to refill

## 2016-02-07 ENCOUNTER — Other Ambulatory Visit: Payer: Self-pay | Admitting: Cardiovascular Disease

## 2016-02-13 ENCOUNTER — Encounter: Payer: Self-pay | Admitting: Cardiovascular Disease

## 2016-02-28 ENCOUNTER — Ambulatory Visit (INDEPENDENT_AMBULATORY_CARE_PROVIDER_SITE_OTHER): Payer: Medicaid Other | Admitting: Internal Medicine

## 2016-02-28 ENCOUNTER — Encounter: Payer: Self-pay | Admitting: Internal Medicine

## 2016-02-28 ENCOUNTER — Ambulatory Visit (INDEPENDENT_AMBULATORY_CARE_PROVIDER_SITE_OTHER): Payer: Medicaid Other | Admitting: Cardiovascular Disease

## 2016-02-28 ENCOUNTER — Encounter: Payer: Self-pay | Admitting: *Deleted

## 2016-02-28 VITALS — BP 124/82 | HR 92 | Ht 67.0 in | Wt 191.0 lb

## 2016-02-28 VITALS — BP 114/80 | HR 100 | Ht 67.0 in | Wt 191.0 lb

## 2016-02-28 DIAGNOSIS — J449 Chronic obstructive pulmonary disease, unspecified: Secondary | ICD-10-CM | POA: Diagnosis not present

## 2016-02-28 DIAGNOSIS — I739 Peripheral vascular disease, unspecified: Secondary | ICD-10-CM | POA: Diagnosis not present

## 2016-02-28 DIAGNOSIS — E785 Hyperlipidemia, unspecified: Secondary | ICD-10-CM | POA: Diagnosis not present

## 2016-02-28 DIAGNOSIS — I779 Disorder of arteries and arterioles, unspecified: Secondary | ICD-10-CM

## 2016-02-28 DIAGNOSIS — I25110 Atherosclerotic heart disease of native coronary artery with unstable angina pectoris: Secondary | ICD-10-CM

## 2016-02-28 DIAGNOSIS — I4819 Other persistent atrial fibrillation: Secondary | ICD-10-CM

## 2016-02-28 DIAGNOSIS — I481 Persistent atrial fibrillation: Secondary | ICD-10-CM | POA: Diagnosis not present

## 2016-02-28 LAB — CBC WITH DIFFERENTIAL/PLATELET
BASOS ABS: 0 {cells}/uL (ref 0–200)
BASOS PCT: 0 %
EOS ABS: 131 {cells}/uL (ref 15–500)
EOS PCT: 1 %
HCT: 38.9 % (ref 35.0–45.0)
HEMOGLOBIN: 13.1 g/dL (ref 11.7–15.5)
LYMPHS ABS: 2358 {cells}/uL (ref 850–3900)
Lymphocytes Relative: 18 %
MCH: 29 pg (ref 27.0–33.0)
MCHC: 33.7 g/dL (ref 32.0–36.0)
MCV: 86.3 fL (ref 80.0–100.0)
MONOS PCT: 7 %
MPV: 9.3 fL (ref 7.5–12.5)
Monocytes Absolute: 917 cells/uL (ref 200–950)
NEUTROS ABS: 9694 {cells}/uL — AB (ref 1500–7800)
Neutrophils Relative %: 74 %
PLATELETS: 334 10*3/uL (ref 140–400)
RBC: 4.51 MIL/uL (ref 3.80–5.10)
RDW: 14.9 % (ref 11.0–15.0)
WBC: 13.1 10*3/uL — ABNORMAL HIGH (ref 3.8–10.8)

## 2016-02-28 LAB — COMPREHENSIVE METABOLIC PANEL
ALK PHOS: 125 U/L (ref 33–130)
ALT: 12 U/L (ref 6–29)
AST: 15 U/L (ref 10–35)
Albumin: 3.7 g/dL (ref 3.6–5.1)
BILIRUBIN TOTAL: 0.4 mg/dL (ref 0.2–1.2)
BUN: 16 mg/dL (ref 7–25)
CO2: 24 mmol/L (ref 20–31)
CREATININE: 0.97 mg/dL (ref 0.50–0.99)
Calcium: 9.4 mg/dL (ref 8.6–10.4)
Chloride: 105 mmol/L (ref 98–110)
GLUCOSE: 104 mg/dL — AB (ref 65–99)
Potassium: 4 mmol/L (ref 3.5–5.3)
SODIUM: 142 mmol/L (ref 135–146)
Total Protein: 6.8 g/dL (ref 6.1–8.1)

## 2016-02-28 LAB — LIPID PANEL
Cholesterol: 146 mg/dL (ref 125–200)
HDL: 54 mg/dL (ref >=46)
LDL CALC: 70 mg/dL (ref <130)
Total CHOL/HDL Ratio: 2.7 Ratio (ref <=5.0)
Triglycerides: 110 mg/dL (ref <150)
VLDL: 22 mg/dL (ref <30)

## 2016-02-28 LAB — PROTIME-INR
INR: 1.1
PROTHROMBIN TIME: 11.7 s — AB (ref 9.0–11.5)

## 2016-02-28 NOTE — Progress Notes (Signed)
Chief Complaint  Patient presents with  . Atrial Fibrillation    paroxysmal    History of Present Illness: 65 yo WF with history of HTN, hyperlipidemia, CAD, PAD, COPD, atrial fibrillation, metastatic renal cell carcinoma here today for cardiac follow up. She underwent cardiac cath in February 2011 in the setting of a NSTEMI showing severe stenosis of the Circumflex and a drug eluting stent was placed. LV dysfunction noted following her MI. Most recent LVEF=40% by [facility] March 2017. She had left femoral to above knee popliteal bypass on Nov 15, 2010. Lower ext dopplers Feb 2017 with stable flow through graft. Carotid artery dopplers February 2017 with mild bilateral disease. Admitted to [facility] July 2015 with abdominal pain, flank pain and hematuria. Found to have left renal cell mass c/w renal cell carcinoma. She had been on Plavix chronically and that was initially held. She was also found to have scattered pulmonary nodules. She was admitted 9/16-9/18/15 and underwent robotic assisted lap radical left nephrectomy. Post-operatively, she became tachycardic in the setting of baseline afib with rates into the 160's.  At her follow-up on 03/30/14 she was started on Eliquis. She was cardioverted on 05/08/14 to sinus. She was seen in our office 8/'8/16 by Estella Husk, PA-C and was back in atrial fib. Metoprolol was increased. She is being followed in oncology by Dr. Osker Mason and in pulmonary by Dr. Chase Caller. Pulmonary nodules stable. Stress myoview December 2016 without ischemia. Hernia repair March 2017 and after the procedure she developed rapid atrial fib and converted to sinus on home therapy.   She is here today for cardiac follow up.  She is having more dyspnea. She has no chest pain. Dyspnea was her presenting symptom before her stent was placed. No lower ext edema. She was told in pulmonary clinic that her lung disease is optimized.   Primary Care Physician: [place]   Past Medical History:  Diagnosis Date  . A-fib ([place])    a. Dx 01/2014 - rate controlled. b. Recurrence 03/2014 in setting of renal cell carcinoma surgery.  . Acute pyelonephritis 01/28/2014  . Anxiety   . Carotid disease, bilateral ([place])    a. 11/2011 U/S: RICA 62-70, LICA <35.  Marland Kitchen COPD (chronic obstructive pulmonary disease) ([place])    uses O2 at night  . Coronary artery disease    a. 08/2009 NSTEMI s/p PCI/DES to LCX. Mod residual LAD dzs;  b. 02/2014 Nonischemic myoview.  Marland Kitchen Dysrhythmia    A-Fib  . History of kidney stones   . Hyperlipidemia   . Hypertension   . Ischemic cardiomyopathy    a. Echo 08/14/09 with LVEF 40-45%;  b. 01/2014 Echo: EF 55-60%, basal-mid inflat and inf HK, triv MR/TR, mod dil LA/RA.  Marland Kitchen Lung nodule    a. 1cm RLL nodule - followed by pulmonology.  . Metastatic renal cell carcinoma to lung ([place]) 01/29/2014  . Pneumonia    hx of  . PVD (peripheral vascular disease) ([place])    a. 11/2010 L Fem to above knee Pop bypass;  b. 12/2011 Angio: patent L fem->pop graft, LCFZ 50-60%.  . Shortness of breath dyspnea    with exertion  . Tobacco abuse    hx of    Past Surgical History:  Procedure Laterality Date  . CARDIAC CATHETERIZATION    . CARDIOVERSION N/A 05/08/2014   Procedure: CARDIOVERSION;  Surgeon: Thayer Headings, MD;  Location: [place];  Service: Cardiovascular;  Laterality: N/A;  . CORONARY STENT  PLACEMENT  2011  . FEMORAL BYPASS Left May 2012  . INCISIONAL HERNIA REPAIR N/A 10/01/2015   Procedure: INCISIONAL HERNIA REPAIR;  Surgeon: Ralene Ok, MD;  Location: [place];  Service: General;  Laterality: N/A;  . INSERTION OF MESH N/A 10/01/2015   Procedure: INSERTION OF MESH;  Surgeon: Ralene Ok, MD;  Location: [place];  Service: General;  Laterality: N/A;  . LOWER EXTREMITY ANGIOGRAM  December 16, 2011  . LOWER EXTREMITY ANGIOGRAM N/A 12/16/2011   Procedure: LOWER EXTREMITY ANGIOGRAM;  Surgeon: Serafina Mitchell, MD;  Location: [facility] CATH LAB;  Service:  Cardiovascular;  Laterality: N/A;  . ROBOT ASSISTED LAPAROSCOPIC NEPHRECTOMY Left 03/22/2014   Procedure: ROBOTIC ASSISTED LAPAROSCOPIC RADICAL NEPHRECTOMY LEFT;  Surgeon: Alexis Frock, MD;  Location[facility];  Service: Urology;  Laterality: Left;  . TONSILLECTOMY  65 years old    Current Outpatient Prescriptions  Medication Sig Dispense Refill  . albuterol (PROVENTIL HFA;VENTOLIN HFA) 108 (90 Base) MCG/ACT inhaler Inhale 2 puffs into the lungs every 6 (six) hours as needed for wheezing or shortness of breath (shortness of breath). 1 Inhaler 3  . apixaban (ELIQUIS) 5 MG TABS tablet Take 1 tablet (5 mg total) by mouth 2 (two) times daily. 180 tablet 2  . atorvastatin (LIPITOR) 40 MG tablet Take 40 mg by mouth daily.    . diphenhydramine-acetaminophen (TYLENOL PM) 25-500 MG TABS Take 2 tablets by mouth daily as needed (for sleep).     . DULERA 200-5 MCG/ACT AERO 2 PUFFS FIRST THING IN AM AND THEN ANOTHER 2 PUFFS ABOUT 12 HOURS LATER 13 g 11  . escitalopram (LEXAPRO) 20 MG tablet Take 20 mg by mouth at bedtime.    . furosemide (LASIX) 20 MG tablet Take 1 tablet (20 mg total) by mouth every morning. 90 tablet 2  . metoprolol (LOPRESSOR) 50 MG tablet Take 100 mg by mouth every morning. Take 7m by mouth in the evening    . potassium chloride (K-DUR,KLOR-CON) 10 MEQ tablet Take 10 mEq by mouth daily.    .Marland Kitchentiotropium (SPIRIVA) 18 MCG inhalation capsule Place 1 capsule (18 mcg total) into inhaler and inhale every morning. 30 capsule 6   No current facility-administered medications for this visit.     Allergies  Allergen Reactions  . Ace Inhibitors Cough  . Amoxicillin-Pot Clavulanate Hives  . Codeine Nausea And Vomiting    Social History   Social History  . Marital status: Married    Spouse name: N/A  . Number of children: 3  . Years of education: N/A   Occupational History  . sculptor Unemployed   Social History Main Topics  . Smoking status: Former Smoker    Packs/day: 1.50     Years: 30.00    Types: Cigarettes    Quit date: 08/12/2009  . Smokeless tobacco: Never Used     Comment: she has smoker 1-2 packs daily since the age of 89 828pack years  . Alcohol use No  . Drug use: No  . Sexual activity: Not on file   Other Topics Concern  . Not on file   Social History Narrative  . No narrative on file    Family History  Problem Relation Age of Onset  . Coronary artery disease Mother     deceased. coronary artery bypass graft  . Cancer Mother     BREAST  . Heart disease Mother     PVD  - Amputation-Leg  . Hyperlipidemia Mother   . Hypertension Mother   .  Heart attack Mother   . Cirrhosis Father     deceased  . Cancer Father   . Other Brother     alive, unknown hx  . Stroke Maternal Aunt     Review of Systems:  As stated in the HPI and otherwise negative.   BP 114/80   Pulse 100   Ht _0  (1.702 m)   Wt 191 lb (86.6 kg)   SpO2 98%   BMI 29.91 kg/m   Physical Examination: General: Well developed, well nourished, NAD  HEENT: OP clear, mucus membranes moist  SKIN: warm, dry. No rashes. Neuro: No focal deficits  Musculoskeletal: Muscle strength 5/5 all ext  Psychiatric: Mood and affect normal  Neck: No JVD, no carotid bruits, no thyromegaly, no lymphadenopathy.  Lungs:Clear bilaterally, no wheezes, rhonci, crackles Cardiovascular: Regular rate and rhythm. No murmurs, gallops or rubs. Abdomen:Soft. Bowel sounds present. Non-tender.  Extremities: No lower extremity edema. Pulses are not palpable in the bilateral DP/PT.  Echo 10/03/15: Left ventricle: Abnormal septal motion. The cavity size was   normal. The estimated ejection fraction was 40%. Diffuse   hypokinesis. - Mitral valve: There was mild regurgitation. - Left atrium: The atrium was moderately dilated. - Pulmonary arteries: PA peak pressure: 39 mm Hg (S). - Impressions: no subcostal views due to recent abdominal surgery  EKG:  EKG is not ordered today. The ekg ordered today  demonstrates   Recent Labs: 10/01/2015: Magnesium 1.7 01/01/2016: ALT 11; BUN 16; Creatinine, Ser 0.91; Hemoglobin 13.5; Platelets 290; Potassium 3.7; Sodium 136   Lipid Panel    Component Value Date/Time   CHOL 186 02/12/2015 1019   TRIG 119.0 02/12/2015 1019   HDL 53.30 02/12/2015 1019   CHOLHDL 3 02/12/2015 1019   VLDL 23.8 02/12/2015 1019   LDLCALC 109 (H) 02/12/2015 1019   LDLDIRECT 180.5 09/02/2011 1011     Wt Readings from Last 3 Encounters:  02/28/16 191 lb (86.6 kg)  02/28/16 191 lb (86.6 kg)  01/01/16 190 lb (86.2 kg)     Other studies Reviewed: Additional studies/ records that were reviewed today include: . Review of the above records demonstrates:   Assessment and Plan:   1. CAD with unstable angina: She is not having chest pain but continues to have dyspnea. Stress myoview December 2016 without ischemia. Her presenting symptom prior to her stent placement in 2011 was dyspnea. She has moderate LAD and RCA disease by cath in 2011. Will proceed with cardiac cath with possible PCI [facility] 03/07/16. Risks and benefits reviewed with pt. She agrees to proceed. Hold Eliquis 2 days before procedure. Pre-cath labs today. She is on good medical therapy.    2. PAD/carotid artery disease: Carotid dopplers Feb 2017 with minimal bilateral disease.  ABI stable Feb 2017 by ABI/doppler. No leg pain with ambulation. She is asymptomatic. Repeat carotid and ABI Feb 2018. We reviewed her tests today.   3. Hyperlipidemia: Continue statin. Check lipids today.   4. COPD/Metastatic cancer to lungs:  Followed in [place] [facility]   5. Atrial fibrillation, persistent: Rate controlled. Continue beta blocker and anti-coagulation with Eliquis.   6. Renal cell carcinoma: s/p resection, stable. Followed in Oncology.   Current medicines are reviewed at length with the patient today.  The patient does not have concerns regarding medicines.  The following changes have been made:  no change  Labs/  tests ordered today include:   Orders Placed This Encounter  Procedures  . CBC w/Diff  . INR/PT  .  Comp Met (CMET)  . Lipid Profile    Disposition:   FU with me in 6 months  Signed, Lauree Chandler, MD 02/28/2016 3:43 PM    [facility] [address], [place], [place]  [address] Phone: [phone]; Fax: [phone]

## 2016-02-28 NOTE — Progress Notes (Signed)
Subjective:     Patient ID: Allison [name], female   DOB: 04/08/1951, 65 y.o.   MRN: [id number]  [facility]P Inc [facility]   HPI   OV  02/28/2016  Chief Complaint  Patient presents with  . Follow-up    Pt states her breathing has worsened since last OV. Pt states she has a mild prod cough with clear mucus. Pt denies CP/tightness and f/c/s.       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 ECOG is 0-1. She continues to do well. COPD stable. She is on triple inhaler therapy with Spiriva and Dulera but she wants to try Stiolto. Insurance will not approve pulmonary rehabilitation because she's Medicaid. She wants and if she can work or by herself with an exercise bike. CT chest shows stable pulmonary nodules according to oncology notes in May 2017.    has a past medical history of A-fib ([place]); Acute pyelonephritis (01/28/2014); Anxiety; Carotid disease, bilateral ([place]); COPD (chronic obstructive pulmonary disease) ([place]); Coronary artery disease; Dysrhythmia; History of kidney stones; Hyperlipidemia; Hypertension; Ischemic cardiomyopathy; Lung nodule; Metastatic renal cell carcinoma to lung ([place]) (01/29/2014); Pneumonia; PVD (peripheral vascular disease) ([place]); Shortness of breath dyspnea; and Tobacco abuse.   reports that she quit smoking about 6 years ago. Her smoking use included Cigarettes. She has a 45.00 pack-year smoking history. She has never used smokeless tobacco.  Past Surgical History:  Procedure Laterality Date  . CARDIAC CATHETERIZATION    . CARDIOVERSION N/A 05/08/2014   Procedure: CARDIOVERSION;  Surgeon: Thayer Headings, MD;  Location: [place];  Service: Cardiovascular;  Laterality: N/A;  . CORONARY STENT PLACEMENT  2011  . FEMORAL BYPASS Left May 2012  . INCISIONAL HERNIA REPAIR N/A 10/01/2015   Procedure: INCISIONAL HERNIA REPAIR;  Surgeon: Ralene Ok, MD;  Location: [place];  Service: General;  Laterality: N/A;  . INSERTION OF  MESH N/A 10/01/2015   Procedure: INSERTION OF MESH;  Surgeon: Ralene Ok, MD;  Location: [place];  Service: General;  Laterality: N/A;  . LOWER EXTREMITY ANGIOGRAM  December 16, 2011  . LOWER EXTREMITY ANGIOGRAM N/A 12/16/2011   Procedure: LOWER EXTREMITY ANGIOGRAM;  Surgeon: Serafina Mitchell, MD;  Location: [facility] CATH LAB;  Service: Cardiovascular;  Laterality: N/A;  . ROBOT ASSISTED LAPAROSCOPIC NEPHRECTOMY Left 03/22/2014   Procedure: ROBOTIC ASSISTED LAPAROSCOPIC RADICAL NEPHRECTOMY LEFT;  Surgeon: Alexis Frock, MD;  [facility];  Service: Urology;  Laterality: Left;  . TONSILLECTOMY  65 years old    Allergies  Allergen Reactions  . Ace Inhibitors Cough  . Amoxicillin-Pot Clavulanate Hives  . Codeine Nausea And Vomiting    Immunization History  Administered Date(s) Administered  . Influenza Split 06/12/2013  . Influenza,inj,Quad PF,36+ Mos 03/23/2014, 05/25/2015  . Pneumococcal Polysaccharide-23 07/08/2007, 11/16/2012, 03/23/2014    Family History  Problem Relation Age of Onset  . Coronary artery disease Mother     deceased. coronary artery bypass graft  . Cancer Mother     BREAST  . Heart disease Mother     PVD  - Amputation-Leg  . Hyperlipidemia Mother   . Hypertension Mother   . Heart attack Mother   . Cirrhosis Father     deceased  . Cancer Father   . Other Brother     alive, unknown hx  . Stroke Maternal Aunt      Current Outpatient Prescriptions:  .  albuterol (PROVENTIL HFA;VENTOLIN HFA) 108 (90 Base) MCG/ACT inhaler, Inhale 2 puffs  into the lungs every 6 (six) hours as needed for wheezing or shortness of breath (shortness of breath)., Disp: 1 Inhaler, Rfl: 3 .  apixaban (ELIQUIS) 5 MG TABS tablet, Take 1 tablet (5 mg total) by mouth 2 (two) times daily., Disp: 180 tablet, Rfl: 2 .  atorvastatin (LIPITOR) 40 MG tablet, Take 40 mg by mouth daily., Disp: , Rfl:  .  diphenhydramine-acetaminophen (TYLENOL PM) 25-500 MG TABS, Take 2 tablets by mouth daily as needed  (for sleep). , Disp: , Rfl:  .  DULERA 200-5 MCG/ACT AERO, 2 PUFFS FIRST THING IN AM AND THEN ANOTHER 2 PUFFS ABOUT 12 HOURS LATER, Disp: 13 g, Rfl: 11 .  escitalopram (LEXAPRO) 10 MG tablet, Take 1 tablet (10 mg total) by mouth at bedtime. (Patient taking differently: Take 20 mg by mouth at bedtime. ), Disp: 90 tablet, Rfl: 2 .  furosemide (LASIX) 20 MG tablet, Take 1 tablet (20 mg total) by mouth every morning., Disp: 90 tablet, Rfl: 2 .  metoprolol (LOPRESSOR) 50 MG tablet, Take 100 mg(2 tablets) by mouth in the am and 75 mg(1 1/2 tablets) by mouth in the pm (Patient taking differently: Take by mouth 2 (two) times daily. Take 100 mg(2 tablets) by mouth in the am and 75 mg(1 1/2 tablets) by mouth in the pm), Disp: 315 tablet, Rfl: 3 .  potassium chloride (K-DUR) 10 MEQ tablet, TAKE 1 TABLET ONCE DAILY, Disp: 30 tablet, Rfl: 3 .  tiotropium (SPIRIVA) 18 MCG inhalation capsule, Place 1 capsule (18 mcg total) into inhaler and inhale every morning., Disp: 30 capsule, Rfl: 6 .  Triprolidine-Pseudoephedrine (ANTIHISTAMINE PO), Take 1-2 tablets by mouth daily as needed (for allergies-congestion)., Disp: , Rfl:    Review of Systems     Objective:   Physical Exam  Constitutional: She is oriented to person, place, and time. She appears well-developed and well-nourished. No distress.  HENT:  Head: Normocephalic and atraumatic.  Right Ear: External ear normal.  Left Ear: External ear normal.  Mouth/Throat: Oropharynx is clear and moist. No oropharyngeal exudate.  Eyes: Conjunctivae and EOM are normal. Pupils are equal, round, and reactive to light. Right eye exhibits no discharge. Left eye exhibits no discharge. No scleral icterus.  Neck: Normal range of motion. Neck supple. No JVD present. No tracheal deviation present. No thyromegaly present.  Cardiovascular: Normal rate, regular rhythm, normal heart sounds and intact distal pulses.  Exam reveals no gallop and no friction rub.   No murmur  heard. Pulmonary/Chest: Effort normal and breath sounds normal. No respiratory distress. She has no wheezes. She has no rales. She exhibits no tenderness.  Abdominal: Soft. Bowel sounds are normal. She exhibits no distension and no mass. There is no tenderness. There is no rebound and no guarding.  Musculoskeletal: Normal range of motion. She exhibits no edema or tenderness.  Lymphadenopathy:    She has no cervical adenopathy.  Neurological: She is alert and oriented to person, place, and time. She has normal reflexes. No cranial nerve deficit. She exhibits normal muscle tone. Coordination normal.  Skin: Skin is warm and dry. No rash noted. She is not diaphoretic. No erythema. No pallor.  Psychiatric: She has a normal mood and affect. Her behavior is normal. Judgment and thought content normal.  Vitals reviewed.   Vitals:   02/28/16 1331  BP: 124/82  Pulse: 92  SpO2: 98%  Weight: 191 lb (86.6 kg)  Height: 5\' 7"  (1.702 m)        Assessment:  ICD-9-CM ICD-10-CM   1. COPD, severe - MS phenotype 496 J44.9        Plan:      COPD appears stable Glad lung nodules are stable as of May 2017  Plan - As per your wish change Spiriva and Dulera to [employer] and Arunity; hopefully insurance approves  - Take a sample - Albuterol as needed - Flu shot in the fall 2017; preferably high dose - Encourage exercise bike  Follow-up 3-6 months or sooner if needed   Dr. Brand Males, M.D., [facility].C.P Pulmonary and Critical Care Medicine Staff Physician [facility] [facility] Pager: [phone], If no answer or between  15:00h - 7:00h: call [phone]  02/28/2016 1:53 PM

## 2016-02-28 NOTE — Patient Instructions (Addendum)
ICD-9-CM ICD-10-CM   1. COPD, severe - MS phenotype 496 J44.9    COPD appears stable Glad lung nodules are stable as of May 2017  Plan - As per your wish change Spiriva and Dulera to [employer] and Arunity; hopefully insurance approves  - Take a sample - Albuterol as needed - Flu shot in the fall 2017; preferably high dose - Encourage exercise bike  Follow-up 3-6 months or sooner if needed

## 2016-02-28 NOTE — Patient Instructions (Signed)
Medication Instructions:  Your physician recommends that you continue on your current medications as directed. Please refer to the Current Medication list given to you today. See catheterization instruction sheet.   Labwork: Lab work to be done today--CMET, Lipid profile, PT, CBC  Testing/Procedures: Your physician has requested that you have a cardiac catheterization. Cardiac catheterization is used to diagnose and/or treat various heart conditions. Doctors may recommend this procedure for a number of different reasons. The most common reason is to evaluate chest pain. Chest pain can be a symptom of coronary artery disease (CAD), and cardiac catheterization can show whether plaque is narrowing or blocking your heart's arteries. This procedure is also used to evaluate the valves, as well as measure the blood flow and oxygen levels in different parts of your heart. For further information please visit [email or web address]. Please follow instruction sheet, as given. Scheduled for September 1,2017   Follow-Up: Your physician recommends that you schedule a follow-up appointment in: about 4 weeks with NP or PA    Any Other Special Instructions Will Be Listed Below (If Applicable).     If you need a refill on your cardiac medications before your next appointment, please call your pharmacy.

## 2016-03-03 ENCOUNTER — Other Ambulatory Visit: Payer: Self-pay | Admitting: Cardiovascular Disease

## 2016-03-03 ENCOUNTER — Other Ambulatory Visit: Payer: Self-pay | Admitting: Physician Assistant

## 2016-03-03 ENCOUNTER — Telehealth: Payer: Self-pay | Admitting: Internal Medicine

## 2016-03-03 MED ORDER — TIOTROPIUM BROMIDE-OLODATEROL 2.5-2.5 MCG/ACT IN AERS
1.0000 | INHALATION_SPRAY | Freq: Every day | RESPIRATORY_TRACT | 3 refills | Status: DC
Start: 2016-03-03 — End: 2016-03-04

## 2016-03-03 MED ORDER — FLUTICASONE FUROATE 100 MCG/ACT IN AEPB: 1.0000 | INHALATION_SPRAY | Freq: Every day | RESPIRATORY_TRACT | 3 refills | Status: DC

## 2016-03-03 NOTE — Telephone Encounter (Signed)
Called spoke with pt. Pt states that at last ov with MR stiolto and arunity was suppose to be sent into the [facility] [employer].  I apologized and sent in both Rx to the pharmacy. She voiced understanding and had no further questions. Nothing further needed.

## 2016-03-04 ENCOUNTER — Telehealth: Payer: Self-pay | Admitting: Internal Medicine

## 2016-03-04 MED ORDER — TIOTROPIUM BROMIDE-OLODATEROL 2.5-2.5 MCG/ACT IN AERS: 2.0000 | INHALATION_SPRAY | Freq: Every day | RESPIRATORY_TRACT | 3 refills | Status: AC

## 2016-03-04 NOTE — Telephone Encounter (Signed)
Called spoke with Lynelle Smoke stiolto is covered by patient insurance but arnuity is not. No alternatives given. Dr. Chase Caller, would you like to proceed with PA? thanks

## 2016-03-04 NOTE — Telephone Encounter (Signed)
[phone] calling back

## 2016-03-04 NOTE — Telephone Encounter (Signed)
Go ahead with stiolto

## 2016-03-04 NOTE — Telephone Encounter (Signed)
I called pt and made aware. Nothing further needed

## 2016-03-04 NOTE — Telephone Encounter (Signed)
Called Tammy with the pharmacy and is aware to cancel the arnuity. LMTCB for pt to make aware

## 2016-03-07 ENCOUNTER — Encounter (HOSPITAL_COMMUNITY): Payer: Self-pay | Admitting: Cardiovascular Disease

## 2016-03-07 ENCOUNTER — Ambulatory Visit (HOSPITAL_COMMUNITY)
Admission: RE | Admit: 2016-03-07 | Discharge: 2016-03-07 | Disposition: A | Discharge status: Home / Self Care | Payer: Medicaid Other | Source: Ambulatory Visit | Attending: Cardiovascular Disease | Admitting: Cardiovascular Disease

## 2016-03-07 ENCOUNTER — Encounter (HOSPITAL_COMMUNITY)
Admission: RE | Disposition: A | Discharge status: Home / Self Care | Payer: Self-pay | Source: Ambulatory Visit | Attending: Cardiovascular Disease

## 2016-03-07 DIAGNOSIS — Y712 Prosthetic and other implants, materials and accessory cardiovascular devices associated with adverse incidents: Secondary | ICD-10-CM | POA: Diagnosis not present

## 2016-03-07 DIAGNOSIS — Z87891 Personal history of nicotine dependence: Secondary | ICD-10-CM | POA: Diagnosis not present

## 2016-03-07 DIAGNOSIS — F419 Anxiety disorder, unspecified: Secondary | ICD-10-CM | POA: Insufficient documentation

## 2016-03-07 DIAGNOSIS — I25110 Atherosclerotic heart disease of native coronary artery with unstable angina pectoris: Secondary | ICD-10-CM | POA: Insufficient documentation

## 2016-03-07 DIAGNOSIS — C7800 Secondary malignant neoplasm of unspecified lung: Secondary | ICD-10-CM | POA: Diagnosis not present

## 2016-03-07 DIAGNOSIS — Z905 Acquired absence of kidney: Secondary | ICD-10-CM | POA: Insufficient documentation

## 2016-03-07 DIAGNOSIS — I428 Other cardiomyopathies: Secondary | ICD-10-CM | POA: Insufficient documentation

## 2016-03-07 DIAGNOSIS — Z7901 Long term (current) use of anticoagulants: Secondary | ICD-10-CM | POA: Insufficient documentation

## 2016-03-07 DIAGNOSIS — Z803 Family history of malignant neoplasm of breast: Secondary | ICD-10-CM | POA: Insufficient documentation

## 2016-03-07 DIAGNOSIS — I739 Peripheral vascular disease, unspecified: Secondary | ICD-10-CM | POA: Insufficient documentation

## 2016-03-07 DIAGNOSIS — I25119 Atherosclerotic heart disease of native coronary artery with unspecified angina pectoris: Secondary | ICD-10-CM | POA: Diagnosis not present

## 2016-03-07 DIAGNOSIS — T82855A Stenosis of coronary artery stent, initial encounter: Secondary | ICD-10-CM | POA: Insufficient documentation

## 2016-03-07 DIAGNOSIS — I252 Old myocardial infarction: Secondary | ICD-10-CM | POA: Insufficient documentation

## 2016-03-07 DIAGNOSIS — I6523 Occlusion and stenosis of bilateral carotid arteries: Secondary | ICD-10-CM | POA: Diagnosis not present

## 2016-03-07 DIAGNOSIS — Z9981 Dependence on supplemental oxygen: Secondary | ICD-10-CM | POA: Insufficient documentation

## 2016-03-07 DIAGNOSIS — I10 Essential (primary) hypertension: Secondary | ICD-10-CM | POA: Diagnosis not present

## 2016-03-07 DIAGNOSIS — I481 Persistent atrial fibrillation: Secondary | ICD-10-CM | POA: Diagnosis not present

## 2016-03-07 DIAGNOSIS — Z8249 Family history of ischemic heart disease and other diseases of the circulatory system: Secondary | ICD-10-CM | POA: Diagnosis not present

## 2016-03-07 DIAGNOSIS — E785 Hyperlipidemia, unspecified: Secondary | ICD-10-CM | POA: Diagnosis not present

## 2016-03-07 DIAGNOSIS — C649 Malignant neoplasm of unspecified kidney, except renal pelvis: Secondary | ICD-10-CM | POA: Insufficient documentation

## 2016-03-07 DIAGNOSIS — J449 Chronic obstructive pulmonary disease, unspecified: Secondary | ICD-10-CM | POA: Insufficient documentation

## 2016-03-07 DIAGNOSIS — Z7902 Long term (current) use of antithrombotics/antiplatelets: Secondary | ICD-10-CM | POA: Insufficient documentation

## 2016-03-07 DIAGNOSIS — Z87442 Personal history of urinary calculi: Secondary | ICD-10-CM | POA: Insufficient documentation

## 2016-03-07 HISTORY — PX: CARDIAC CATHETERIZATION: SHX172

## 2016-03-07 SURGERY — LEFT HEART CATH AND CORONARY ANGIOGRAPHY
Anesthesia: LOCAL

## 2016-03-07 MED ORDER — SODIUM CHLORIDE 0.9 % WEIGHT BASED INFUSION
3.0000 mL/kg/h | INTRAVENOUS | Status: DC
  Administered 2016-03-07: 3 mL/kg/h via INTRAVENOUS

## 2016-03-07 MED ORDER — SODIUM CHLORIDE 0.9 % WEIGHT BASED INFUSION
1.0000 mL/kg/h | INTRAVENOUS | Status: DC
  Administered 2016-03-07: 1 mL/kg/h via INTRAVENOUS

## 2016-03-07 MED ORDER — SODIUM CHLORIDE 0.9 % IV SOLN: 250.0000 mL | INTRAVENOUS | Status: DC | PRN

## 2016-03-07 MED ORDER — SODIUM CHLORIDE 0.9% FLUSH: 3.0000 mL | Freq: Two times a day (BID) | INTRAVENOUS | Status: DC

## 2016-03-07 MED ORDER — FENTANYL CITRATE (PF) 100 MCG/2ML IJ SOLN
INTRAMUSCULAR | Status: AC
  Filled 2016-03-07: qty 2

## 2016-03-07 MED ORDER — MIDAZOLAM HCL 2 MG/2ML IJ SOLN
INTRAMUSCULAR | Status: DC | PRN
  Administered 2016-03-07: 2 mg via INTRAVENOUS

## 2016-03-07 MED ORDER — ASPIRIN 81 MG PO CHEW
CHEWABLE_TABLET | ORAL | Status: AC
  Filled 2016-03-07: qty 1

## 2016-03-07 MED ORDER — IOPAMIDOL (ISOVUE-370) INJECTION 76%
INTRAVENOUS | Status: DC | PRN
  Administered 2016-03-07: 70 mL via INTRA_ARTERIAL

## 2016-03-07 MED ORDER — LIDOCAINE HCL (PF) 1 % IJ SOLN
INTRAMUSCULAR | Status: DC | PRN
  Administered 2016-03-07: 2 mL

## 2016-03-07 MED ORDER — HEPARIN (PORCINE) IN NACL 2-0.9 UNIT/ML-% IJ SOLN
INTRAMUSCULAR | Status: DC | PRN
  Administered 2016-03-07: 1000 mL

## 2016-03-07 MED ORDER — VERAPAMIL HCL 2.5 MG/ML IV SOLN
INTRAVENOUS | Status: AC
  Filled 2016-03-07: qty 2

## 2016-03-07 MED ORDER — LIDOCAINE HCL (PF) 1 % IJ SOLN
INTRAMUSCULAR | Status: AC
  Filled 2016-03-07: qty 30

## 2016-03-07 MED ORDER — HEPARIN SODIUM (PORCINE) 1000 UNIT/ML IJ SOLN
INTRAMUSCULAR | Status: DC | PRN
  Administered 2016-03-07: 4500 [IU] via INTRAVENOUS

## 2016-03-07 MED ORDER — SODIUM CHLORIDE 0.9% FLUSH: 3.0000 mL | INTRAVENOUS | Status: DC | PRN

## 2016-03-07 MED ORDER — SODIUM CHLORIDE 0.9 % IV SOLN: INTRAVENOUS | Status: AC

## 2016-03-07 MED ORDER — HEPARIN SODIUM (PORCINE) 1000 UNIT/ML IJ SOLN
INTRAMUSCULAR | Status: AC
  Filled 2016-03-07: qty 1

## 2016-03-07 MED ORDER — ASPIRIN 81 MG PO CHEW
81.0000 mg | CHEWABLE_TABLET | ORAL | Status: AC
  Administered 2016-03-07: 81 mg via ORAL

## 2016-03-07 MED ORDER — HEPARIN (PORCINE) IN NACL 2-0.9 UNIT/ML-% IJ SOLN
INTRAMUSCULAR | Status: AC
Start: 2016-03-07 — End: 2016-03-07
  Filled 2016-03-07: qty 1500

## 2016-03-07 MED ORDER — FENTANYL CITRATE (PF) 100 MCG/2ML IJ SOLN
INTRAMUSCULAR | Status: DC | PRN
  Administered 2016-03-07: 25 ug via INTRAVENOUS

## 2016-03-07 MED ORDER — MIDAZOLAM HCL 2 MG/2ML IJ SOLN
INTRAMUSCULAR | Status: AC
  Filled 2016-03-07: qty 2

## 2016-03-07 MED ORDER — VERAPAMIL HCL 2.5 MG/ML IV SOLN
INTRAVENOUS | Status: DC | PRN
  Administered 2016-03-07: 10 mL via INTRA_ARTERIAL

## 2016-03-07 SURGICAL SUPPLY — 11 items

## 2016-03-07 NOTE — Interval H&P Note (Signed)
History and Physical Interval Note:  03/07/2016 8:43 AM  Allison [name]  has presented today for cardiac cath with the diagnosis of unstable angina.  The various methods of treatment have been discussed with the patient and family. After consideration of risks, benefits and other options for treatment, the patient has consented to  Procedure(s): Left Heart Cath and Coronary Angiography (N/A) as a surgical intervention .  The patient's history has been reviewed, patient examined, no change in status, stable for surgery.  I have reviewed the patient's chart and labs.  Questions were answered to the patient's satisfaction.    Cath Lab Visit (complete for each Cath Lab visit)  Clinical Evaluation Leading to the Procedure:   ACS: No.  Non-ACS:    Anginal Classification: CCS III   Anti-ischemic medical therapy: Minimal Therapy (1 class of medications)  Non-Invasive Test Results: No non-invasive testing performed  Prior CABG: No previous CABG         Lauree Chandler

## 2016-03-07 NOTE — H&P (View-Only) (Signed)
Chief Complaint  Patient presents with  . Atrial Fibrillation    paroxysmal    History of Present Illness: 65 yo WF with history of HTN, hyperlipidemia, CAD, PAD, COPD, atrial fibrillation, metastatic renal cell carcinoma here today for cardiac follow up. She underwent cardiac cath in February 2011 in the setting of a NSTEMI showing severe stenosis of the Circumflex and a drug eluting stent was placed. LV dysfunction noted following her MI. Most recent LVEF=40% by [facility] March 2017. She had left femoral to above knee popliteal bypass on Nov 15, 2010. Lower ext dopplers Feb 2017 with stable flow through graft. Carotid artery dopplers February 2017 with mild bilateral disease. Admitted to [facility] July 2015 with abdominal pain, flank pain and hematuria. Found to have left renal cell mass c/w renal cell carcinoma. She had been on Plavix chronically and that was initially held. She was also found to have scattered pulmonary nodules. She was admitted 9/16-9/18/15 and underwent robotic assisted lap radical left nephrectomy. Post-operatively, she became tachycardic in the setting of baseline afib with rates into the 160's.  At her follow-up on 03/30/14 she was started on Eliquis. She was cardioverted on 05/08/14 to sinus. She was seen in our office 8/'8/16 by Estella Husk, PA-C and was back in atrial fib. Metoprolol was increased. She is being followed in oncology by Dr. Osker Mason and in pulmonary by Dr. Chase Caller. Pulmonary nodules stable. Stress myoview December 2016 without ischemia. Hernia repair March 2017 and after the procedure she developed rapid atrial fib and converted to sinus on home therapy.   She is here today for cardiac follow up.  She is having more dyspnea. She has no chest pain. Dyspnea was her presenting symptom before her stent was placed. No lower ext edema. She was told in pulmonary clinic that her lung disease is optimized.   Primary Care Physician: [place]   Past Medical History:  Diagnosis Date  . A-fib ([place])    a. Dx 01/2014 - rate controlled. b. Recurrence 03/2014 in setting of renal cell carcinoma surgery.  . Acute pyelonephritis 01/28/2014  . Anxiety   . Carotid disease, bilateral ([place])    a. 11/2011 U/S: RICA 62-70, LICA <35.  Marland Kitchen COPD (chronic obstructive pulmonary disease) ([place])    uses O2 at night  . Coronary artery disease    a. 08/2009 NSTEMI s/p PCI/DES to LCX. Mod residual LAD dzs;  b. 02/2014 Nonischemic myoview.  Marland Kitchen Dysrhythmia    A-Fib  . History of kidney stones   . Hyperlipidemia   . Hypertension   . Ischemic cardiomyopathy    a. Echo 08/14/09 with LVEF 40-45%;  b. 01/2014 Echo: EF 55-60%, basal-mid inflat and inf HK, triv MR/TR, mod dil LA/RA.  Marland Kitchen Lung nodule    a. 1cm RLL nodule - followed by pulmonology.  . Metastatic renal cell carcinoma to lung ([place]) 01/29/2014  . Pneumonia    hx of  . PVD (peripheral vascular disease) ([place])    a. 11/2010 L Fem to above knee Pop bypass;  b. 12/2011 Angio: patent L fem->pop graft, LCFZ 50-60%.  . Shortness of breath dyspnea    with exertion  . Tobacco abuse    hx of    Past Surgical History:  Procedure Laterality Date  . CARDIAC CATHETERIZATION    . CARDIOVERSION N/A 05/08/2014   Procedure: CARDIOVERSION;  Surgeon: Thayer Headings, MD;  Location: [place];  Service: Cardiovascular;  Laterality: N/A;  . CORONARY STENT  PLACEMENT  2011  . FEMORAL BYPASS Left May 2012  . INCISIONAL HERNIA REPAIR N/A 10/01/2015   Procedure: INCISIONAL HERNIA REPAIR;  Surgeon: Ralene Ok, MD;  Location: [place];  Service: General;  Laterality: N/A;  . INSERTION OF MESH N/A 10/01/2015   Procedure: INSERTION OF MESH;  Surgeon: Ralene Ok, MD;  Location: [place];  Service: General;  Laterality: N/A;  . LOWER EXTREMITY ANGIOGRAM  December 16, 2011  . LOWER EXTREMITY ANGIOGRAM N/A 12/16/2011   Procedure: LOWER EXTREMITY ANGIOGRAM;  Surgeon: Serafina Mitchell, MD;  Location: [facility] C[facility] LAB;  Service:  Cardiovascular;  Laterality: N/A;  . ROBOT ASSISTED LAPAROSCOPIC NEPHRECTOMY Left 03/22/2014   Procedure: ROBOTIC ASSISTED LAPAROSCOPIC RADICAL NEPHRECTOMY LEFT;  Surgeon: Alexis Frock, MD;  [facility];  Service: Urology;  Laterality: Left;  . TONSILLECTOMY  65 years old    Current Outpatient Prescriptions  Medication Sig Dispense Refill  . albuterol (PROVENTIL HFA;VENTOLIN HFA) 108 (90 Base) MCG/ACT inhaler Inhale 2 puffs into the lungs every 6 (six) hours as needed for wheezing or shortness of breath (shortness of breath). 1 Inhaler 3  . apixaban (ELIQUIS) 5 MG TABS tablet Take 1 tablet (5 mg total) by mouth 2 (two) times daily. 180 tablet 2  . atorvastatin (LIPITOR) 40 MG tablet Take 40 mg by mouth daily.    . diphenhydramine-acetaminophen (TYLENOL PM) 25-500 MG TABS Take 2 tablets by mouth daily as needed (for sleep).     . DULERA 200-5 MCG/ACT AERO 2 PUFFS FIRST THING IN AM AND THEN ANOTHER 2 PUFFS ABOUT 12 HOURS LATER 13 g 11  . escitalopram (LEXAPRO) 20 MG tablet Take 20 mg by mouth at bedtime.    . furosemide (LASIX) 20 MG tablet Take 1 tablet (20 mg total) by mouth every morning. 90 tablet 2  . metoprolol (LOPRESSOR) 50 MG tablet Take 100 mg by mouth every morning. Take 7m by mouth in the evening    . potassium chloride (K-DUR,KLOR-CON) 10 MEQ tablet Take 10 mEq by mouth daily.    .Marland Kitchentiotropium (SPIRIVA) 18 MCG inhalation capsule Place 1 capsule (18 mcg total) into inhaler and inhale every morning. 30 capsule 6   No current facility-administered medications for this visit.     Allergies  Allergen Reactions  . Ace Inhibitors Cough  . Amoxicillin-Pot Clavulanate Hives  . Codeine Nausea And Vomiting    Social History   Social History  . Marital status: Married    Spouse name: N/A  . Number of children: 3  . Years of education: N/A   Occupational History  . sculptor Unemployed   Social History Main Topics  . Smoking status: Former Smoker    Packs/day: 1.50     Years: 30.00    Types: Cigarettes    Quit date: 08/12/2009  . Smokeless tobacco: Never Used     Comment: she has smoker 1-2 packs daily since the age of 89 828pack years  . Alcohol use No  . Drug use: No  . Sexual activity: Not on file   Other Topics Concern  . Not on file   Social History Narrative  . No narrative on file    Family History  Problem Relation Age of Onset  . Coronary artery disease Mother     deceased. coronary artery bypass graft  . Cancer Mother     BREAST  . Heart disease Mother     PVD  - Amputation-Leg  . Hyperlipidemia Mother   . Hypertension Mother   .  Heart attack Mother   . Cirrhosis Father     deceased  . Cancer Father   . Other Brother     alive, unknown hx  . Stroke Maternal Aunt     Review of Systems:  As stated in the HPI and otherwise negative.   BP 114/80   Pulse 100   Ht _0  (1.702 m)   Wt 191 lb (86.6 kg)   SpO2 98%   BMI 29.91 kg/m   Physical Examination: General: Well developed, well nourished, NAD  HEENT: OP clear, mucus membranes moist  SKIN: warm, dry. No rashes. Neuro: No focal deficits  Musculoskeletal: Muscle strength 5/5 all ext  Psychiatric: Mood and affect normal  Neck: No JVD, no carotid bruits, no thyromegaly, no lymphadenopathy.  Lungs:Clear bilaterally, no wheezes, rhonci, crackles Cardiovascular: Regular rate and rhythm. No murmurs, gallops or rubs. Abdomen:Soft. Bowel sounds present. Non-tender.  Extremities: No lower extremity edema. Pulses are not palpable in the bilateral DP/PT.  Echo 10/03/15: Left ventricle: Abnormal septal motion. The cavity size was   normal. The estimated ejection fraction was 40%. Diffuse   hypokinesis. - Mitral valve: There was mild regurgitation. - Left atrium: The atrium was moderately dilated. - Pulmonary arteries: PA peak pressure: 39 mm Hg (S). - Impressions: no subcostal views due to recent abdominal surgery  EKG:  EKG is not ordered today. The ekg ordered today  demonstrates   Recent Labs: 10/01/2015: Magnesium 1.7 01/01/2016: ALT 11; BUN 16; Creatinine, Ser 0.91; Hemoglobin 13.5; Platelets 290; Potassium 3.7; Sodium 136   Lipid Panel    Component Value Date/Time   CHOL 186 02/12/2015 1019   TRIG 119.0 02/12/2015 1019   HDL 53.30 02/12/2015 1019   CHOLHDL 3 02/12/2015 1019   VLDL 23.8 02/12/2015 1019   LDLCALC 109 (H) 02/12/2015 1019   LDLDIRECT 180.5 09/02/2011 1011     Wt Readings from Last 3 Encounters:  02/28/16 191 lb (86.6 kg)  02/28/16 191 lb (86.6 kg)  01/01/16 190 lb (86.2 kg)     Other studies Reviewed: Additional studies/ records that were reviewed today include: . Review of the above records demonstrates:   Assessment and Plan:   1. CAD with unstable angina: She is not having chest pain but continues to have dyspnea. Stress myoview December 2016 without ischemia. Her presenting symptom prior to her stent placement in 2011 was dyspnea. She has moderate LAD and RCA disease by cath in 2011. Will proceed with cardiac cath with possible PCI at [facility] 03/07/16. Risks and benefits reviewed with pt. She agrees to proceed. Hold Eliquis 2 days before procedure. Pre-cath labs today. She is on good medical therapy.    2. PAD/carotid artery disease: Carotid dopplers Feb 2017 with minimal bilateral disease.  ABI stable Feb 2017 by ABI/doppler. No leg pain with ambulation. She is asymptomatic. Repeat carotid and ABI Feb 2018. We reviewed her tests today.   3. Hyperlipidemia: Continue statin. Check lipids today.   4. COPD/Metastatic cancer to lungs:  Followed in [place] [facility]   5. Atrial fibrillation, persistent: Rate controlled. Continue beta blocker and anti-coagulation with Eliquis.   6. Renal cell carcinoma: s/p resection, stable. Followed in Oncology.   Current medicines are reviewed at length with the patient today.  The patient does not have concerns regarding medicines.  The following changes have been made:  no change  Labs/  tests ordered today include:   Orders Placed This Encounter  Procedures  . CBC w/Diff  . INR/PT  .  Comp Met (CMET)  . Lipid Profile    Disposition:   FU with me in 6 months  Signed, Lauree Chandler, MD 02/28/2016 3:43 PM    [facility] [address], [place], [place]  [address] Phone: [phone]; Fax: [phone]

## 2016-03-07 NOTE — Discharge Instructions (Signed)
Resume Eliquis tomorrow 03/08/16 if no bleeding from cath access site.   Radial Site Care Refer to this sheet in the next few weeks. These instructions provide you with information about caring for yourself after your procedure. Your health care provider may also give you more specific instructions. Your treatment has been planned according to current medical practices, but problems sometimes occur. Call your health care provider if you have any problems or questions after your procedure. WHAT TO EXPECT AFTER THE PROCEDURE After your procedure, it is typical to have the following:  Bruising at the radial site that usually fades within 1-2 weeks.  Blood collecting in the tissue (hematoma) that may be painful to the touch. It should usually decrease in size and tenderness within 1-2 weeks. HOME CARE INSTRUCTIONS  Take medicines only as directed by your health care provider.  You may shower 24-48 hours after the procedure or as directed by your health care provider. Remove the bandage (dressing) and gently wash the site with plain soap and water. Pat the area dry with a clean towel. Do not rub the site, because this may cause bleeding.  Do not take baths, swim, or use a hot tub until your health care provider approves.  Check your insertion site every day for redness, swelling, or drainage.  Do not apply powder or lotion to the site.  Do not flex or bend the affected arm for 24 hours or as directed by your health care provider.  Do not push or pull heavy objects with the affected arm for 24 hours or as directed by your health care provider.  Do not lift over 10 lb (4.5 kg) for 5 days after your procedure or as directed by your health care provider.  Ask your health care provider when it is okay to:  Return to work or school.  Resume usual physical activities or sports.  Resume sexual activity.  Do not drive home if you are discharged the same day as the procedure. Have someone else  drive you.  You may drive 24 hours after the procedure unless otherwise instructed by your health care provider.  Do not operate machinery or power tools for 24 hours after the procedure.  If your procedure was done as an outpatient procedure, which means that you went home the same day as your procedure, a responsible adult should be with you for the first 24 hours after you arrive home.  Keep all follow-up visits as directed by your health care provider. This is important. SEEK MEDICAL CARE IF:  You have a fever.  You have chills.  You have increased bleeding from the radial site. Hold pressure on the site. SEEK IMMEDIATE MEDICAL CARE IF:  You have unusual pain at the radial site.  You have redness, warmth, or swelling at the radial site.  You have drainage (other than a small amount of blood on the dressing) from the radial site.  The radial site is bleeding, and the bleeding does not stop after 30 minutes of holding steady pressure on the site.  Your arm or hand becomes pale, cool, tingly, or numb.   This information is not intended to replace advice given to you by your health care provider. Make sure you discuss any questions you have with your health care provider.   Document Released: 07/26/2010 Document Revised: 07/14/2014 Document Reviewed: 01/09/2014 Elsevier Interactive Patient Education [employer].

## 2016-03-07 NOTE — Research (Signed)
LEADERS FREE II Informed Consent   Subject Name: Allison [name]  Subject met inclusion and exclusion criteria.  The informed consent form, study requirements and expectations were reviewed with the subject and questions and concerns were addressed prior to the signing of the consent form.  The subject verbalized understanding of the trail requirements.  The subject agreed to participate in the LEADERS FREE II trial and signed the informed consent.  The informed consent was obtained prior to performance of any protocol-specific procedures for the subject.  A copy of the signed informed consent was given to the subject and a copy was placed in the subject's medical record. If [name] meets angiographic criteria she would be a candidate for the LEADERS FREE stent.  [name] 03/07/2016, 9:11 AM  

## 2016-03-21 ENCOUNTER — Encounter: Payer: Self-pay | Admitting: Cardiology

## 2016-03-25 ENCOUNTER — Encounter (HOSPITAL_COMMUNITY): Payer: Self-pay | Admitting: Emergency Medicine

## 2016-03-25 ENCOUNTER — Observation Stay (HOSPITAL_COMMUNITY): Payer: Medicaid Other

## 2016-03-25 ENCOUNTER — Emergency Department (HOSPITAL_COMMUNITY): Payer: Medicaid Other

## 2016-03-25 ENCOUNTER — Inpatient Hospital Stay (HOSPITAL_COMMUNITY)
Admission: EM | Admit: 2016-03-25 | Discharge: 2016-03-27 | DRG: 054 | Disposition: A | Discharge status: Home / Self Care | Payer: Medicaid Other | Attending: Internal Medicine | Admitting: Internal Medicine

## 2016-03-25 DIAGNOSIS — Z8249 Family history of ischemic heart disease and other diseases of the circulatory system: Secondary | ICD-10-CM

## 2016-03-25 DIAGNOSIS — I25119 Atherosclerotic heart disease of native coronary artery with unspecified angina pectoris: Secondary | ICD-10-CM | POA: Diagnosis present

## 2016-03-25 DIAGNOSIS — N289 Disorder of kidney and ureter, unspecified: Secondary | ICD-10-CM | POA: Diagnosis present

## 2016-03-25 DIAGNOSIS — J449 Chronic obstructive pulmonary disease, unspecified: Secondary | ICD-10-CM | POA: Diagnosis present

## 2016-03-25 DIAGNOSIS — Z85528 Personal history of other malignant neoplasm of kidney: Secondary | ICD-10-CM

## 2016-03-25 DIAGNOSIS — Z87442 Personal history of urinary calculi: Secondary | ICD-10-CM

## 2016-03-25 DIAGNOSIS — Z7951 Long term (current) use of inhaled steroids: Secondary | ICD-10-CM

## 2016-03-25 DIAGNOSIS — Z823 Family history of stroke: Secondary | ICD-10-CM

## 2016-03-25 DIAGNOSIS — Z79899 Other long term (current) drug therapy: Secondary | ICD-10-CM

## 2016-03-25 DIAGNOSIS — Z955 Presence of coronary angioplasty implant and graft: Secondary | ICD-10-CM

## 2016-03-25 DIAGNOSIS — C7931 Secondary malignant neoplasm of brain: Principal | ICD-10-CM | POA: Diagnosis present

## 2016-03-25 DIAGNOSIS — I4891 Unspecified atrial fibrillation: Secondary | ICD-10-CM | POA: Diagnosis not present

## 2016-03-25 DIAGNOSIS — Z888 Allergy status to other drugs, medicaments and biological substances status: Secondary | ICD-10-CM

## 2016-03-25 DIAGNOSIS — C649 Malignant neoplasm of unspecified kidney, except renal pelvis: Secondary | ICD-10-CM

## 2016-03-25 DIAGNOSIS — Z809 Family history of malignant neoplasm, unspecified: Secondary | ICD-10-CM

## 2016-03-25 DIAGNOSIS — I5022 Chronic systolic (congestive) heart failure: Secondary | ICD-10-CM | POA: Diagnosis present

## 2016-03-25 DIAGNOSIS — Z905 Acquired absence of kidney: Secondary | ICD-10-CM

## 2016-03-25 DIAGNOSIS — G936 Cerebral edema: Secondary | ICD-10-CM | POA: Diagnosis present

## 2016-03-25 DIAGNOSIS — I739 Peripheral vascular disease, unspecified: Secondary | ICD-10-CM | POA: Diagnosis present

## 2016-03-25 DIAGNOSIS — I10 Essential (primary) hypertension: Secondary | ICD-10-CM | POA: Diagnosis present

## 2016-03-25 DIAGNOSIS — C7800 Secondary malignant neoplasm of unspecified lung: Secondary | ICD-10-CM | POA: Diagnosis present

## 2016-03-25 DIAGNOSIS — N183 Chronic kidney disease, stage 3 unspecified: Secondary | ICD-10-CM | POA: Diagnosis present

## 2016-03-25 DIAGNOSIS — I481 Persistent atrial fibrillation: Secondary | ICD-10-CM | POA: Diagnosis present

## 2016-03-25 DIAGNOSIS — I130 Hypertensive heart and chronic kidney disease with heart failure and stage 1 through stage 4 chronic kidney disease, or unspecified chronic kidney disease: Secondary | ICD-10-CM | POA: Diagnosis present

## 2016-03-25 DIAGNOSIS — Z7901 Long term (current) use of anticoagulants: Secondary | ICD-10-CM

## 2016-03-25 DIAGNOSIS — Z87891 Personal history of nicotine dependence: Secondary | ICD-10-CM

## 2016-03-25 DIAGNOSIS — Z881 Allergy status to other antibiotic agents status: Secondary | ICD-10-CM

## 2016-03-25 DIAGNOSIS — I255 Ischemic cardiomyopathy: Secondary | ICD-10-CM | POA: Diagnosis present

## 2016-03-25 DIAGNOSIS — I252 Old myocardial infarction: Secondary | ICD-10-CM

## 2016-03-25 DIAGNOSIS — J9610 Chronic respiratory failure, unspecified whether with hypoxia or hypercapnia: Secondary | ICD-10-CM | POA: Diagnosis present

## 2016-03-25 DIAGNOSIS — E785 Hyperlipidemia, unspecified: Secondary | ICD-10-CM | POA: Diagnosis present

## 2016-03-25 DIAGNOSIS — M6289 Other specified disorders of muscle: Secondary | ICD-10-CM

## 2016-03-25 DIAGNOSIS — F419 Anxiety disorder, unspecified: Secondary | ICD-10-CM | POA: Diagnosis present

## 2016-03-25 DIAGNOSIS — R531 Weakness: Secondary | ICD-10-CM

## 2016-03-25 LAB — COMPREHENSIVE METABOLIC PANEL
ALBUMIN: 3.8 g/dL (ref 3.5–5.0)
ALT: 19 U/L (ref 14–54)
AST: 25 U/L (ref 15–41)
Alkaline Phosphatase: 153 U/L — ABNORMAL HIGH (ref 38–126)
Anion gap: 8 (ref 5–15)
BILIRUBIN TOTAL: 0.5 mg/dL (ref 0.3–1.2)
BUN: 11 mg/dL (ref 6–20)
CALCIUM: 9.9 mg/dL (ref 8.9–10.3)
CO2: 26 mmol/L (ref 22–32)
Chloride: 105 mmol/L (ref 101–111)
Creatinine, Ser: 1.08 mg/dL — ABNORMAL HIGH (ref 0.44–1.00)
GFR calc Af Amer: >60 mL/min (ref >60)
GFR, EST NON AFRICAN AMERICAN: 53 mL/min — AB (ref >60)
Glucose, Bld: 118 mg/dL — ABNORMAL HIGH (ref 65–99)
POTASSIUM: 3.3 mmol/L — AB (ref 3.5–5.1)
Sodium: 139 mmol/L (ref 135–145)
TOTAL PROTEIN: 8.1 g/dL (ref 6.5–8.1)

## 2016-03-25 LAB — CBC
HEMATOCRIT: 45.1 % (ref 36.0–46.0)
HEMOGLOBIN: 14.6 g/dL (ref 12.0–15.0)
MCH: 29.3 pg (ref 26.0–34.0)
MCHC: 32.4 g/dL (ref 30.0–36.0)
MCV: 90.4 fL (ref 78.0–100.0)
Platelets: 405 10*3/uL — ABNORMAL HIGH (ref 150–400)
RBC: 4.99 MIL/uL (ref 3.87–5.11)
RDW: 14.7 % (ref 11.5–15.5)
WBC: 12.4 10*3/uL — AB (ref 4.0–10.5)

## 2016-03-25 LAB — CBG MONITORING, ED: Glucose-Capillary: 115 mg/dL — ABNORMAL HIGH (ref 65–99)

## 2016-03-25 LAB — DIFFERENTIAL
BASOS ABS: 0 10*3/uL (ref 0.0–0.1)
BASOS PCT: 0 %
EOS ABS: 0.1 10*3/uL (ref 0.0–0.7)
Eosinophils Relative: 1 %
LYMPHS ABS: 2.3 10*3/uL (ref 0.7–4.0)
Lymphocytes Relative: 19 %
MONOS PCT: 6 %
Monocytes Absolute: 0.8 10*3/uL (ref 0.1–1.0)
NEUTROS ABS: 9.1 10*3/uL — AB (ref 1.7–7.7)
NEUTROS PCT: 74 %

## 2016-03-25 LAB — I-STAT CHEM 8, ED
BUN: 12 mg/dL (ref 6–20)
CALCIUM ION: 1.22 mmol/L (ref 1.15–1.40)
CHLORIDE: 103 mmol/L (ref 101–111)
Creatinine, Ser: 1 mg/dL (ref 0.44–1.00)
Glucose, Bld: 114 mg/dL — ABNORMAL HIGH (ref 65–99)
HCT: 46 % (ref 36.0–46.0)
HEMOGLOBIN: 15.6 g/dL — AB (ref 12.0–15.0)
Potassium: 3.4 mmol/L — ABNORMAL LOW (ref 3.5–5.1)
SODIUM: 142 mmol/L (ref 135–145)
TCO2: 26 mmol/L (ref 0–100)

## 2016-03-25 LAB — APTT: APTT: 40 s — AB (ref 24–36)

## 2016-03-25 LAB — I-STAT TROPONIN, ED: Troponin i, poc: 0 ng/mL (ref 0.00–0.08)

## 2016-03-25 LAB — PROTIME-INR
INR: 1.29
Prothrombin Time: 16.2 seconds — ABNORMAL HIGH (ref 11.4–15.2)

## 2016-03-25 MED ORDER — DILTIAZEM HCL 25 MG/5ML IV SOLN
10.0000 mg | Freq: Once | INTRAVENOUS | Status: AC
  Administered 2016-03-25: 10 mg via INTRAVENOUS
  Filled 2016-03-25: qty 5

## 2016-03-25 MED ORDER — FLUTICASONE FUROATE 100 MCG/ACT IN AEPB
1.0000 | INHALATION_SPRAY | Freq: Every day | RESPIRATORY_TRACT | Status: DC
Start: 2016-03-26 — End: 2016-03-26

## 2016-03-25 MED ORDER — ALBUTEROL SULFATE (2.5 MG/3ML) 0.083% IN NEBU
2.5000 mg | INHALATION_SOLUTION | Freq: Four times a day (QID) | RESPIRATORY_TRACT | Status: DC | PRN
  Administered 2016-03-27: 2.5 mg via RESPIRATORY_TRACT
  Filled 2016-03-25 (×2): qty 3

## 2016-03-25 MED ORDER — ONDANSETRON HCL 4 MG/2ML IJ SOLN: 4.0000 mg | Freq: Four times a day (QID) | INTRAMUSCULAR | Status: DC | PRN

## 2016-03-25 MED ORDER — ESCITALOPRAM OXALATE 10 MG PO TABS
20.0000 mg | ORAL_TABLET | Freq: Every day | ORAL | Status: DC
Start: 2016-03-25 — End: 2016-03-27
  Administered 2016-03-26 (×2): 20 mg via ORAL
  Filled 2016-03-25 (×2): qty 2

## 2016-03-25 MED ORDER — POTASSIUM CHLORIDE CRYS ER 10 MEQ PO TBCR
10.0000 meq | EXTENDED_RELEASE_TABLET | Freq: Every day | ORAL | Status: DC
  Administered 2016-03-26 – 2016-03-27 (×2): 10 meq via ORAL
  Filled 2016-03-25 (×2): qty 1

## 2016-03-25 MED ORDER — ACETAMINOPHEN 650 MG RE SUPP: 650.0000 mg | Freq: Four times a day (QID) | RECTAL | Status: DC | PRN

## 2016-03-25 MED ORDER — DEXAMETHASONE SODIUM PHOSPHATE 10 MG/ML IJ SOLN
10.0000 mg | Freq: Once | INTRAMUSCULAR | Status: AC
  Administered 2016-03-25: 10 mg via INTRAVENOUS
  Filled 2016-03-25: qty 1

## 2016-03-25 MED ORDER — APIXABAN 5 MG PO TABS
5.0000 mg | ORAL_TABLET | Freq: Two times a day (BID) | ORAL | Status: DC
  Administered 2016-03-26 – 2016-03-27 (×4): 5 mg via ORAL
  Filled 2016-03-25 (×4): qty 1

## 2016-03-25 MED ORDER — METOPROLOL TARTRATE 50 MG PO TABS
75.0000 mg | ORAL_TABLET | Freq: Every day | ORAL | Status: DC
  Administered 2016-03-26 (×2): 75 mg via ORAL
  Filled 2016-03-25: qty 1

## 2016-03-25 MED ORDER — DILTIAZEM HCL 25 MG/5ML IV SOLN
10.0000 mg | Freq: Once | INTRAVENOUS | Status: AC
  Administered 2016-03-25: 10 mg via INTRAVENOUS

## 2016-03-25 MED ORDER — DEXAMETHASONE SODIUM PHOSPHATE 4 MG/ML IJ SOLN
4.0000 mg | Freq: Four times a day (QID) | INTRAMUSCULAR | Status: DC
  Administered 2016-03-26 – 2016-03-27 (×6): 4 mg via INTRAVENOUS
  Filled 2016-03-25 (×6): qty 1

## 2016-03-25 MED ORDER — METOPROLOL TARTRATE 50 MG PO TABS
100.0000 mg | ORAL_TABLET | Freq: Every day | ORAL | Status: DC
Start: 2016-03-26 — End: 2016-03-27
  Administered 2016-03-26 – 2016-03-27 (×2): 100 mg via ORAL
  Filled 2016-03-25: qty 2
  Filled 2016-03-25: qty 4
  Filled 2016-03-25: qty 2

## 2016-03-25 MED ORDER — DIPHENHYDRAMINE HCL 50 MG PO CAPS
50.0000 mg | ORAL_CAPSULE | Freq: Every evening | ORAL | Status: DC | PRN
  Administered 2016-03-26 (×2): 50 mg via ORAL
  Filled 2016-03-25 (×2): qty 1

## 2016-03-25 MED ORDER — DIPHENHYDRAMINE-APAP (SLEEP) 25-500 MG PO TABS: 2.0000 | ORAL_TABLET | Freq: Every day | ORAL | Status: DC | PRN

## 2016-03-25 MED ORDER — ACETAMINOPHEN 325 MG PO TABS: 650.0000 mg | ORAL_TABLET | Freq: Four times a day (QID) | ORAL | Status: DC | PRN

## 2016-03-25 MED ORDER — SIMVASTATIN 20 MG PO TABS: 20.0000 mg | ORAL_TABLET | Freq: Every day | ORAL | Status: DC

## 2016-03-25 MED ORDER — ACETAMINOPHEN 500 MG PO TABS
1000.0000 mg | ORAL_TABLET | Freq: Every evening | ORAL | Status: DC | PRN
  Administered 2016-03-26 (×2): 1000 mg via ORAL
  Filled 2016-03-25 (×2): qty 2

## 2016-03-25 MED ORDER — FUROSEMIDE 20 MG PO TABS
20.0000 mg | ORAL_TABLET | Freq: Every morning | ORAL | Status: DC
  Administered 2016-03-26 – 2016-03-27 (×2): 20 mg via ORAL
  Filled 2016-03-25 (×2): qty 1

## 2016-03-25 MED ORDER — ONDANSETRON HCL 4 MG PO TABS: 4.0000 mg | ORAL_TABLET | Freq: Four times a day (QID) | ORAL | Status: DC | PRN

## 2016-03-25 MED ORDER — DEXTROSE 5 % IV SOLN: 5.0000 mg/h | Freq: Once | INTRAVENOUS | Status: DC

## 2016-03-25 NOTE — H&P (Signed)
History and Physical    Allison [name] [id number] DOB: [birth year] DOA: 03/25/2016  PCP: [facility]  Patient coming from: Home.  Chief Complaint: Right-sided weakness.  HPI: Allison [name] is a 65 y.o. 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 Alen Blew, oncologist, persistent atrial fibrillation, CAD status post stenting, hyperlipidemia, COPD, who underwent cardiac cath first week of this month was noticed increasing weakness since last night involving the right extremities. This evening around 4 PM patient was finding it difficulty to raise her right upper extremity and was dragging her right lower extremity. Patient was brought to the ER and had a CT head which showed metastatic lesions with vasogenic edema. Patient was given Decadron following which patient's symptoms have remarkably improved. On-call neurologist was consulted and admitted for further workup. In addition patient is found to be in A. fib with RVR. Initially Cardizem bolus was given but patient still remains in RVR. A second bolus of Cardizem has been ordered and I have also ordered patient's evening dose of metoprolol. Patient otherwise denies any chest pain shortness of breath nausea vomiting diarrhea headache visual symptoms or difficulty swallowing.  ED Course: See history of presenting illness.  Review of Systems: As per HPI, rest all negative.   Past Medical History:  Diagnosis Date  . A-fib ([place])    a. Dx 01/2014 - rate controlled. b. Recurrence 03/2014 in setting of renal cell carcinoma surgery.  . Acute pyelonephritis 01/28/2014  . Anxiety   . Carotid disease, bilateral ([place])    a. 11/2011 U/S: RICA [id number], LICA [id number].  Marland Kitchen COPD (chronic obstructive pulmonary disease) ([place])    uses O2 at night  . Coronary artery disease    a. 08/2009 NSTEMI s/p PCI/DES to LCX. Mod residual LAD dzs;  b. 02/2014 Nonischemic myoview.  Marland Kitchen Dysrhythmia    A-Fib  . History of kidney stones   . Hyperlipidemia   . Hypertension   . Ischemic cardiomyopathy    a. Echo 08/14/09 with LVEF 40-45%;  b. 01/2014 Echo: EF 55-60%, basal-mid inflat and inf HK, triv MR/TR, mod dil LA/RA.  Marland Kitchen Lung nodule    a. 1cm RLL nodule - followed by pulmonology.  . Metastatic renal cell carcinoma to lung ([place]) 01/29/2014  . Pneumonia    hx of  . PVD (peripheral vascular disease) ([place])    a. 11/2010 L Fem to above knee Pop bypass;  b. 12/2011 Angio: patent L fem->pop graft, LCFZ 50-60%.  . Shortness of breath dyspnea    with exertion  . Tobacco abuse    hx of    Past Surgical History:  Procedure Laterality Date  . CARDIAC CATHETERIZATION    . CARDIAC CATHETERIZATION N/A 03/07/2016   Procedure: Left Heart Cath and Coronary Angiography;  Surgeon: Burnell Blanks, MD;  Location: [place] [facility];  Service: Cardiovascular;  Laterality: N/A;  . CARDIOVERSION N/A 05/08/2014   Procedure: CARDIOVERSION;  Surgeon: Thayer Headings, MD;  Location: [place];  Service: Cardiovascular;  Laterality: N/A;  . CORONARY STENT PLACEMENT  2011  . FEMORAL BYPASS Left May 2012  . INCISIONAL HERNIA REPAIR N/A 10/01/2015   Procedure: INCISIONAL HERNIA REPAIR;  Surgeon: Ralene Ok, MD;  Location: [place];  Service: General;  Laterality: N/A;  . INSERTION OF MESH N/A 10/01/2015   Procedure: INSERTION OF MESH;  Surgeon: Ralene Ok, MD;  Location: [place];  Service: General;  Laterality: N/A;  .  LOWER EXTREMITY ANGIOGRAM  December 16, 2011  . LOWER EXTREMITY ANGIOGRAM N/A 12/16/2011   Procedure: LOWER EXTREMITY ANGIOGRAM;  Surgeon: Serafina Mitchell, MD;  Location: [facility];  Service: Cardiovascular;  Laterality: N/A;  . ROBOT ASSISTED LAPAROSCOPIC NEPHRECTOMY Left 03/22/2014   Procedure: ROBOTIC ASSISTED LAPAROSCOPIC RADICAL NEPHRECTOMY LEFT;  Surgeon: Alexis Frock, MD;  Location: [facility];  Service: Urology;  Laterality: Left;  . TONSILLECTOMY  65 years old     reports that she  quit smoking about 6 years ago. Her smoking use included Cigarettes. She has a 45.00 pack-year smoking history. She has never used smokeless tobacco. She reports that she does not drink alcohol or use drugs.  Allergies  Allergen Reactions  . Ace Inhibitors Cough  . Amoxicillin-Pot Clavulanate Hives  . Codeine Nausea And Vomiting    Family History  Problem Relation Age of Onset  . Coronary artery disease Mother     deceased. coronary artery bypass graft  . Cancer Mother     BREAST  . Heart disease Mother     PVD  - Amputation-Leg  . Hyperlipidemia Mother   . Hypertension Mother   . Heart attack Mother   . Cirrhosis Father     deceased  . Cancer Father   . Other Brother     alive, unknown hx  . Stroke Maternal Aunt     Prior to Admission medications   Medication Sig Start Date End Date Taking? Authorizing Provider  albuterol (PROVENTIL HFA;VENTOLIN HFA) 108 (90 Base) MCG/ACT inhaler Inhale 2 puffs into the lungs every 6 (six) hours as needed for wheezing or shortness of breath (shortness of breath). 07/26/15  Yes Tammy S Parrett, NP  apixaban (ELIQUIS) 5 MG TABS tablet Take 1 tablet (5 mg total) by mouth 2 (two) times daily. 01/24/16  Yes Burnell Blanks, MD  diphenhydramine-acetaminophen (TYLENOL PM) 25-500 MG TABS Take 2 tablets by mouth daily as needed (for sleep).    Yes Historical Provider, MD  escitalopram (LEXAPRO) 20 MG tablet Take 20 mg by mouth at bedtime.   Yes Historical Provider, MD  Fluticasone Furoate (ARNUITY ELLIPTA) 100 MCG/ACT AEPB Inhale 1 puff into the lungs daily. 03/03/16  Yes Brand Males, MD  furosemide (LASIX) 20 MG tablet TAKE 1 TABLET EVERY MORNING. 03/04/16  Yes Burnell Blanks, MD  metoprolol (LOPRESSOR) 50 MG tablet Take 100 mg by mouth every morning. Take 75mg  by mouth in the evening   Yes Historical Provider, MD  potassium chloride (K-DUR,KLOR-CON) 10 MEQ tablet Take 10 mEq by mouth daily.   Yes Historical Provider, MD  simvastatin  (ZOCOR) 20 MG tablet Take 20 mg by mouth daily at 6 PM.   Yes Historical Provider, MD  Tiotropium Bromide-Olodaterol (STIOLTO RESPIMAT) 2.5-2.5 MCG/ACT AERS Inhale 2 puffs into the lungs daily. 03/04/16  Yes Brand Males, MD    Physical Exam: Vitals:   03/25/16 2200 03/25/16 2215 03/25/16 2230 03/25/16 2245  BP: (!) 143/53 125/59 124/61 149/77  Pulse: 110 102 113 (!) 125  Resp: 19 20 25 23   Temp:      TempSrc:      SpO2: 97% 96% 95% 96%      Constitutional: Not in distress. Vitals:   03/25/16 2200 03/25/16 2215 03/25/16 2230 03/25/16 2245  BP: (!) 143/53 125/59 124/61 149/77  Pulse: 110 102 113 (!) 125  Resp: 19 20 25 23   Temp:      TempSrc:      SpO2: 97% 96% 95% 96%  Eyes: Anicteric no pallor. ENMT: No discharge from the ears eyes nose or mouth. Neck: No mass felt. No neck rigidity. Respiratory: No rhonchi or crepitations. Cardiovascular: S1 and S2 heard. Abdomen: Soft nontender bowel sounds present. No guarding or rigidity. Musculoskeletal: No edema. Skin: No rash. Neurologic: Alert awake oriented to time place and person. Mild weakness of the right upper and lower extremity. Left upper and lower extremities is 5 x 5. No facial asymmetry. Tongue is midline. Psychiatric: Appears normal.   Labs on Admission: I have personally reviewed following labs and imaging studies  CBC:  Recent Labs Lab 03/25/16 2012  WBC 12.4*  NEUTROABS 9.1*  HGB 15.6*  14.6  HCT 46.0  45.1  MCV 90.4  PLT 123456*   Basic Metabolic Panel:  Recent Labs Lab 03/25/16 2012  NA 139  142  K 3.3*  3.4*  CL 105  103  CO2 26  GLUCOSE 118*  114*  BUN 11  12  CREATININE 1.08*  1.00  CALCIUM 9.9   GFR: CrCl cannot be calculated (Unknown ideal weight.). Liver Function Tests:  Recent Labs Lab 03/25/16 2012  AST 25  ALT 19  ALKPHOS 153*  BILITOT 0.5  PROT 8.1  ALBUMIN 3.8   No results for input(s): LIPASE, AMYLASE in the last 168 hours. No results for input(s):  AMMONIA in the last 168 hours. Coagulation Profile:  Recent Labs Lab 03/25/16 2012  INR 1.29   Cardiac Enzymes: No results for input(s): CKTOTAL, CKMB, CKMBINDEX, TROPONINI in the last 168 hours. BNP (last 3 results) No results for input(s): PROBNP in the last 8760 hours. HbA1C: No results for input(s): HGBA1C in the last 72 hours. CBG:  Recent Labs Lab 03/25/16 1951  GLUCAP 115*   Lipid Profile: No results for input(s): CHOL, HDL, LDLCALC, TRIG, CHOLHDL, LDLDIRECT in the last 72 hours. Thyroid Function Tests: No results for input(s): TSH, T4TOTAL, FREET4, T3FREE, THYROIDAB in the last 72 hours. Anemia Panel: No results for input(s): VITAMINB12, FOLATE, FERRITIN, TIBC, IRON, RETICCTPCT in the last 72 hours. Urine analysis:    Component Value Date/Time   COLORURINE YELLOW 03/22/2014 1713   APPEARANCEUR CLOUDY (A) 03/22/2014 1713   LABSPEC 1.031 (H) 03/22/2014 1713   PHURINE 5.5 03/22/2014 1713   GLUCOSEU NEGATIVE 03/22/2014 1713   HGBUR SMALL (A) 03/22/2014 1713   BILIRUBINUR NEGATIVE 03/22/2014 1713   KETONESUR NEGATIVE 03/22/2014 1713   PROTEINUR 30 (A) 03/22/2014 1713   UROBILINOGEN 0.2 03/22/2014 1713   NITRITE NEGATIVE 03/22/2014 1713   LEUKOCYTESUR NEGATIVE 03/22/2014 1713   Sepsis Labs: @LABRCNTIP (procalcitonin:4,lacticidven:4) )No results found for this or any previous visit (from the past 240 hour(s)).   Radiological Exams on Admission: Ct Head Code Stroke W/o Cm  Result Date: 03/25/2016 CLINICAL DATA:  Code stroke. RIGHT-sided weakness and confusion, last seen normal at 1600 hours. History of metastatic renal cell carcinoma, atrial fibrillation. EXAM: CT HEAD WITHOUT CONTRAST TECHNIQUE: Contiguous axial images were obtained from the base of the skull through the vertex without intravenous contrast. COMPARISON:  CT HEAD January 01, 2016 FINDINGS: BRAIN: New LEFT centrum semiovale 10 mm rounded density with extensive surrounding hypodense vasogenic edema. New  RIGHT frontal lobe subcortical 7 mm dense lesion with vasogenic edema. Local mass effect without midline shift. Moderate ventriculomegaly on the basis of global parenchymal brain volume loss, stable from prior imaging. No acute large vascular territory infarcts. No abnormal extra-axial fluid collections. Basal cisterns are patent . VASCULAR: Moderate calcific atherosclerosis of the carotid siphons. SKULL: No skull  fracture. Multiple sub cm lytic lesions in the calvarium. No significant scalp soft tissue swelling. SINUSES/ORBITS: The mastoid air-cells and included paranasal sinuses are well-aerated.The included ocular globes and orbital contents are non-suspicious. OTHER: None. ASPECTS [facility] Stroke Program Early CT Score) - Ganglionic level infarction (caudate, lentiform nuclei, internal capsule, insula, M1-M3 cortex): 7 - Supraganglionic infarction (M4-M6 cortex): 3 Total score (0-10 with 10 being normal): 10 IMPRESSION: 1. 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 No acute large vascular territory infarct. Sub cm lytic lesions in the calvarium, suspicious for osseous metastasis. 2. ASPECTS is 10. Acute findings discussed with and reconfirmed by Dr.Dr. Nicole Kindred, Neurology On 03/25/2016 at 8:20 pm. Electronically Signed   By: Elon Alas M.D.   On: 03/25/2016 20:24    EKG: Independently reviewed. A. fib with RVR.  Assessment/Plan Principal Problem:   Brain metastasis (HCC) Active Problems:   Essential hypertension   Coronary artery disease involving native coronary artery of native heart with angina pectoris (HCC)   Peripheral vascular disease, unspecified (HCC)   COPD, severe - MS phenotype   CKD (chronic kidney disease), stage III   Atrial fibrillation with RVR ([place])    1. Brain metastasis with known history of metastatic renal cell carcinoma - appreciate neurology consult. Did discuss with Dr. Wallie Char  on-call neurologist. Plan is to have MRI brain with and without contrast. Patient will be continued on Decadron 4 mg IV every 6 hourly. Patient will be transferred to [facility] for further management with oncology consult. Patient is agreeable to transfer and Dr. Blaine Hamper, will be the accepting physician. 2. A. fib with RVR - a second bolus of Cardizem has been ordered. I have also ordered patient's evening dose of metoprolol. If patient remains consistently in RVR then will need Cardizem infusion. Chads 2 vasc score is more than 2 and patient is on Apixaban. 3. CAD status post stenting recent cardiac cath - denies any chest pain. 4. COPD - presently not wheezing, continue inhalers. 5. LV dysfunction last EF measured was 40% in March 2017 - on Lasix. Appears compensated.   DVT prophylaxis: Apixaban. Code Status: Full code.  Family Communication: Patient's son.  Disposition Plan: Home.  Consults called: Neurology.  Admission status: Observation.    Rise Patience MD [facility] Pager [phone].  If 7PM-7AM, please contact night-coverage [email or web address] Password [email or web address]  03/25/2016, 11:17 PM

## 2016-03-25 NOTE — ED Notes (Signed)
Called [facility] to activate code stroke

## 2016-03-25 NOTE — Consult Note (Signed)
Admission H&P    Chief Complaint: Weakness of right extremities.  HPI: Allison [name] is an 65 y.o. female with a history of peripheral vascular disease, metastatic renal cell carcinoma to along, lung nodule, hypertension, hyperlipidemia, atrial fibrillation on Eliquis and COPD, brought to the emergency room because of recurrent right extremity weakness. Current was started at 4 PM today. She has a similar problem transiently yesterday. She's had no speech changes. No facial droop is been noted. She has not been experiencing headaches. No seizure activity has been reported. Her son is noted mild confusion but no other significant mental status changes. CT scan of her head showed 2 areas of probable metastatic tumor involving the frontal region with associated vasogenic edema. A lytic lesion involving the calvarium was also seen at thought to be suspicious for metastasis.  Past Medical History:  Diagnosis Date  . A-fib ([place])    a. Dx 01/2014 - rate controlled. b. Recurrence 03/2014 in setting of renal cell carcinoma surgery.  . Acute pyelonephritis 01/28/2014  . Anxiety   . Carotid disease, bilateral ([place])    a. 11/2011 U/S: RICA 50-27, LICA <74.  Marland Kitchen COPD (chronic obstructive pulmonary disease) ([place])    uses O2 at night  . Coronary artery disease    a. 08/2009 NSTEMI s/p PCI/DES to LCX. Mod residual LAD dzs;  b. 02/2014 Nonischemic myoview.  Marland Kitchen Dysrhythmia    A-Fib  . History of kidney stones   . Hyperlipidemia   . Hypertension   . Ischemic cardiomyopathy    a. Echo 08/14/09 with LVEF 40-45%;  b. 01/2014 Echo: EF 55-60%, basal-mid inflat and inf HK, triv MR/TR, mod dil LA/RA.  Marland Kitchen Lung nodule    a. 1cm RLL nodule - followed by pulmonology.  . Metastatic renal cell carcinoma to lung ([place]) 01/29/2014  . Pneumonia    hx of  . PVD (peripheral vascular disease) ([place])    a. 11/2010 L Fem to above knee Pop bypass;  b. 12/2011 Angio: patent L fem->pop graft, LCFZ 50-60%.  . Shortness of breath dyspnea    with  exertion  . Tobacco abuse    hx of    Past Surgical History:  Procedure Laterality Date  . CARDIAC CATHETERIZATION    . CARDIAC CATHETERIZATION N/A 03/07/2016   Procedure: Left Heart Cath and Coronary Angiography;  Surgeon: Burnell Blanks, MD;  Location: [place] [facility];  Service: Cardiovascular;  Laterality: N/A;  . CARDIOVERSION N/A 05/08/2014   Procedure: CARDIOVERSION;  Surgeon: Thayer Headings, MD;  Location: [place];  Service: Cardiovascular;  Laterality: N/A;  . CORONARY STENT PLACEMENT  2011  . FEMORAL BYPASS Left May 2012  . INCISIONAL HERNIA REPAIR N/A 10/01/2015   Procedure: INCISIONAL HERNIA REPAIR;  Surgeon: Ralene Ok, MD;  Location: [facility];  Service: General;  Laterality: N/A;  . INSERTION OF MESH N/A 10/01/2015   Procedure: INSERTION OF MESH;  Surgeon: Ralene Ok, MD;  Location: [place];  Service: General;  Laterality: N/A;  . LOWER EXTREMITY ANGIOGRAM  December 16, 2011  . LOWER EXTREMITY ANGIOGRAM N/A 12/16/2011   Procedure: LOWER EXTREMITY ANGIOGRAM;  Surgeon: Serafina Mitchell, MD;  Location[facility];  Service: Cardiovascular;  Laterality: N/A;  . ROBOT ASSISTED LAPAROSCOPIC NEPHRECTOMY Left 03/22/2014   Procedure: ROBOTIC ASSISTED LAPAROSCOPIC RADICAL NEPHRECTOMY LEFT;  Surgeon: Alexis Frock, MD;  Location: [facility];  Service: Urology;  Laterality: Left;  . TONSILLECTOMY  65 years old    Family History  Problem Relation Age  of Onset  . Coronary artery disease Mother     deceased. coronary artery bypass graft  . Cancer Mother     BREAST  . Heart disease Mother     PVD  - Amputation-Leg  . Hyperlipidemia Mother   . Hypertension Mother   . Heart attack Mother   . Cirrhosis Father     deceased  . Cancer Father   . Other Brother     alive, unknown hx  . Stroke Maternal Aunt    Social History:  reports that she quit smoking about 6 years ago. Her smoking use included Cigarettes. She has a 45.00 pack-year smoking history. She has never used  smokeless tobacco. She reports that she does not drink alcohol or use drugs.  Allergies:  Allergies  Allergen Reactions  . Ace Inhibitors Cough  . Amoxicillin-Pot Clavulanate Hives  . Codeine Nausea And Vomiting    Occasions: Preadmission medications were reviewed by me.  ROS: History obtained from patient and her son.  General ROS: negative for - chills, fatigue, fever, night sweats, weight gain or weight loss Psychological ROS: negative for - behavioral disorder, hallucinations, memory difficulties, mood swings or suicidal ideation Ophthalmic ROS: negative for - blurry vision, double vision, eye pain or loss of vision ENT ROS: negative for - epistaxis, nasal discharge, oral lesions, sore throat, tinnitus or vertigo Allergy and Immunology ROS: negative for - hives or itchy/watery eyes Hematological and Lymphatic ROS: negative for - bleeding problems, bruising or swollen lymph nodes Endocrine ROS: negative for - galactorrhea, hair pattern changes, polydipsia/polyuria or temperature intolerance Respiratory ROS: negative for - cough, hemoptysis, shortness of breath or wheezing Cardiovascular ROS: negative for - chest pain, dyspnea on exertion, edema or irregular heartbeat Gastrointestinal ROS: negative for - abdominal pain, diarrhea, hematemesis, nausea/vomiting or stool incontinence Genito-Urinary ROS: negative for - dysuria, hematuria, incontinence or urinary frequency/urgency Musculoskeletal ROS: negative for - joint swelling or muscular weakness Neurological ROS: as noted in HPI Dermatological ROS: negative for rash and skin lesion changes  Physical Examination: Blood pressure 162/84, pulse 109, temperature 98.4 F (36.9 C), temperature source Oral, resp. rate 19, SpO2 98 %.  HEENT-  Normocephalic, no lesions, without obvious abnormality.  Normal external eye and conjunctiva.  Normal TM's bilaterally.  Normal auditory canals and external ears. Normal external nose, mucus  membranes and septum.  Normal pharynx. Neck supple with no masses, nodes, nodules or enlargement. Cardiovascular - irregularly irregular rhythm, tachycardia noted Lungs - chest clear, no wheezing, rales, normal symmetric air entry Abdomen - soft, non-tender; bowel sounds normal; no masses,  no organomegaly Extremities - no joint deformities, effusion, or inflammation  Neurologic Examination: Mental Status: Alert, oriented, no acute distress.  Speech fluent without evidence of aphasia. Able to follow commands without difficulty. Cranial Nerves: II-Visual fields were normal. III/IV/VI-Pupils were equal and reacted. Extraocular movements were full and conjugate.    V/VII-no facial numbness and no facial weakness. VIII-normal. X-normal speech. XI: trapezius strength/neck flexion strength normal bilaterally XII-midline tongue extension with normal strength. Motor: Moderately severe weakness proximally of right lower extremity; normal strength of right upper extremity; motor exam otherwise unremarkable, as well Sensory: Unable to perceive tactile sensation over right lower extremity compared to left lower extremity. Deep Tendon Reflexes: 1+ and symmetric. Plantars: Mute bilaterally Cerebellar: Moderately impaired coordination of right upper extremity. Carotid auscultation: Normal  Results for orders placed or performed during the hospital encounter of 03/25/16 (from the past 48 hour(s))  CBG monitoring, ED  Status: Abnormal   Collection Time: 03/25/16  7:51 PM  Result Value Ref Range   Glucose-Capillary 115 (H) 65 - 99 mg/dL  I-stat troponin, ED     Status: None   Collection Time: 03/25/16  8:10 PM  Result Value Ref Range   Troponin i, poc 0.00 0.00 - 0.08 ng/mL   Comment 3            Comment: Due to the release kinetics of cTnI, a negative result within the first hours of the onset of symptoms does not rule out myocardial infarction with certainty. If myocardial infarction is  still suspected, repeat the test at appropriate intervals.   Protime-INR     Status: Abnormal   Collection Time: 03/25/16  8:12 PM  Result Value Ref Range   Prothrombin Time 16.2 (H) 11.4 - 15.2 seconds   INR 1.29   APTT     Status: Abnormal   Collection Time: 03/25/16  8:12 PM  Result Value Ref Range   aPTT 40 (H) 24 - 36 seconds    Comment:        IF BASELINE aPTT IS ELEVATED, SUGGEST PATIENT RISK ASSESSMENT BE USED TO DETERMINE APPROPRIATE ANTICOAGULANT THERAPY.   CBC     Status: Abnormal   Collection Time: 03/25/16  8:12 PM  Result Value Ref Range   WBC 12.4 (H) 4.0 - 10.5 K/uL   RBC 4.99 3.87 - 5.11 MIL/uL   Hemoglobin 14.6 12.0 - 15.0 g/dL   HCT 45.1 36.0 - 46.0 %   MCV 90.4 78.0 - 100.0 fL   MCH 29.3 26.0 - 34.0 pg   MCHC 32.4 30.0 - 36.0 g/dL   RDW 14.7 11.5 - 15.5 %   Platelets 405 (H) 150 - 400 K/uL  Differential     Status: Abnormal   Collection Time: 03/25/16  8:12 PM  Result Value Ref Range   Neutrophils Relative % 74 %   Neutro Abs 9.1 (H) 1.7 - 7.7 K/uL   Lymphocytes Relative 19 %   Lymphs Abs 2.3 0.7 - 4.0 K/uL   Monocytes Relative 6 %   Monocytes Absolute 0.8 0.1 - 1.0 K/uL   Eosinophils Relative 1 %   Eosinophils Absolute 0.1 0.0 - 0.7 K/uL   Basophils Relative 0 %   Basophils Absolute 0.0 0.0 - 0.1 K/uL  Comprehensive metabolic panel     Status: Abnormal   Collection Time: 03/25/16  8:12 PM  Result Value Ref Range   Sodium 139 135 - 145 mmol/L   Potassium 3.3 (L) 3.5 - 5.1 mmol/L   Chloride 105 101 - 111 mmol/L   CO2 26 22 - 32 mmol/L   Glucose, Bld 118 (H) 65 - 99 mg/dL   BUN 11 6 - 20 mg/dL   Creatinine, Ser 1.08 (H) 0.44 - 1.00 mg/dL   Calcium 9.9 8.9 - 10.3 mg/dL   Total Protein 8.1 6.5 - 8.1 g/dL   Albumin 3.8 3.5 - 5.0 g/dL   AST 25 15 - 41 U/L   ALT 19 14 - 54 U/L   Alkaline Phosphatase 153 (H) 38 - 126 U/L   Total Bilirubin 0.5 0.3 - 1.2 mg/dL   GFR calc non Af Amer 53 (L) >60 mL/min   GFR calc Af Amer >60 >60 mL/min    Comment:  (NOTE) The eGFR has been calculated using the CKD EPI equation. This calculation has not been validated in all clinical situations. eGFR's persistently <60 mL/min signify possible Chronic Kidney Disease.  Anion gap 8 5 - 15  I-Stat Chem 8, ED     Status: Abnormal   Collection Time: 03/25/16  8:12 PM  Result Value Ref Range   Sodium 142 135 - 145 mmol/L   Potassium 3.4 (L) 3.5 - 5.1 mmol/L   Chloride 103 101 - 111 mmol/L   BUN 12 6 - 20 mg/dL   Creatinine, Ser 1.00 0.44 - 1.00 mg/dL   Glucose, Bld 114 (H) 65 - 99 mg/dL   Calcium, Ion 1.22 1.15 - 1.40 mmol/L   TCO2 26 0 - 100 mmol/L   Hemoglobin 15.6 (H) 12.0 - 15.0 g/dL   HCT 46.0 36.0 - 46.0 %   Ct Head Code Stroke W/o Cm  Result Date: 03/25/2016 CLINICAL DATA:  Code stroke. RIGHT-sided weakness and confusion, last seen normal at 1600 hours. History of metastatic renal cell carcinoma, atrial fibrillation. EXAM: CT HEAD WITHOUT CONTRAST TECHNIQUE: Contiguous axial images were obtained from the base of the skull through the vertex without intravenous contrast. COMPARISON:  CT HEAD January 01, 2016 FINDINGS: BRAIN: New LEFT centrum semiovale 10 mm rounded density with extensive surrounding hypodense vasogenic edema. New RIGHT frontal lobe subcortical 7 mm dense lesion with vasogenic edema. Local mass effect without midline shift. Moderate ventriculomegaly on the basis of global parenchymal brain volume loss, stable from prior imaging. No acute large vascular territory infarcts. No abnormal extra-axial fluid collections. Basal cisterns are patent . VASCULAR: Moderate calcific atherosclerosis of the carotid siphons. SKULL: No skull fracture. Multiple sub cm lytic lesions in the calvarium. No significant scalp soft tissue swelling. SINUSES/ORBITS: The mastoid air-cells and included paranasal sinuses are well-aerated.The included ocular globes and orbital contents are non-suspicious. OTHER: None. ASPECTS [facility] Stroke Program Early CT Score) -  Ganglionic level infarction (caudate, lentiform nuclei, internal capsule, insula, M1-M3 cortex): 7 - Supraganglionic infarction (M4-M6 cortex): 3 Total score (0-10 with 10 being normal): 10 IMPRESSION: 1. 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 No acute large vascular territory infarct. Sub cm lytic lesions in the calvarium, suspicious for osseous metastasis. 2. ASPECTS is 10. Acute findings discussed with and reconfirmed by Dr.Dr. Nicole Kindred, Neurology On 03/25/2016 at 8:20 pm. Electronically Signed   By: Elon Alas M.D.   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  Recommendations: 1. Decadron IV 10 mg followed by 4 mg every 6 hours 2. MRI of the brain without and with contrast 3. No indication for anticonvulsant medication at this point 4. Oncology consult C.R. Nicole Kindred, MD [facility]  03/25/2016, 9:13 PM

## 2016-03-25 NOTE — ED Provider Notes (Signed)
[facility] DEPT Provider Note   CSN: [id number] Arrival date & time: 03/25/16  1944   An emergency department physician performed an initial assessment on this suspected stroke patient at 2000.  History   Chief Complaint Chief Complaint  Patient presents with  . Weakness    HPI Allison [name] is a 65 y.o. female.  HPI 65 year old female who presents with right-sided weakness. She has a history of atrial fibrillation on apixaban, ischemic cardiomyopathy, hypertension, and hyperlipidemia. Prior history of metastatic renal cell carcinoma to the lung, but states that she is currently in remission. History provided by her son as well who states that yesterday was complaining of some weakness in her legs. Today at 4 PM she had noticeable weakness of her right lower extremity and right arm with numbness. All confusion. No headache, nausea or vomiting, recent illnesses including fever or chills. No chest pain or difficulty breathing. No vision or speech changes. No prior history of stroke. Past Medical History:  Diagnosis Date  . A-fib ([place])    a. Dx 01/2014 - rate controlled. b. Recurrence 03/2014 in setting of renal cell carcinoma surgery.  . Acute pyelonephritis 01/28/2014  . Anxiety   . Carotid disease, bilateral ([place])    a. 11/2011 U/S: RICA 123XX123, LICA [id number].  Marland Kitchen COPD (chronic obstructive pulmonary disease) ([place])    uses O2 at night  . Coronary artery disease    a. 08/2009 NSTEMI s/p PCI/DES to LCX. Mod residual LAD dzs;  b. 02/2014 Nonischemic myoview.  Marland Kitchen Dysrhythmia    A-Fib  . History of kidney stones   . Hyperlipidemia   . Hypertension   . Ischemic cardiomyopathy    a. Echo 08/14/09 with LVEF 40-45%;  b. 01/2014 Echo: EF 55-60%, basal-mid inflat and inf HK, triv MR/TR, mod dil LA/RA.  Marland Kitchen Lung nodule    a. 1cm RLL nodule - followed by pulmonology.  . Metastatic renal cell carcinoma to lung ([place]) 01/29/2014  . Pneumonia    hx of  . PVD (peripheral vascular disease) ([place])    a.  11/2010 L Fem to above knee Pop bypass;  b. 12/2011 Angio: patent L fem->pop graft, LCFZ 50-60%.  . Shortness of breath dyspnea    with exertion  . Tobacco abuse    hx of    Patient Active Problem List   Diagnosis Date Noted  . Brain metastases ([place]) 03/25/2016  . COPD exacerbation ([place])   . S/P hernia repair 10/01/2015  . Hypoxia   . Shortness of breath   . Acute respiratory failure with hypoxia ([place])   . Chronic respiratory failure ([place]) 08/01/2015  . Preop respiratory exam 05/25/2015  . Dyspnea and respiratory abnormality 05/25/2015  . Hyperlipidemia 02/12/2015  . Multiple lung nodules 01/30/2015  . CKD (chronic kidney disease), stage III 04/21/2014  . Nodule of right lung 02/26/2014  . Nocturnal hypoxemia 02/07/2014  . Metastatic renal cell carcinoma to lung ([place]) 01/29/2014  . Hyponatremia 01/28/2014  . Hematuria, gross 01/28/2014  . Left renal mass 01/28/2014  . Paroxysmal atrial fibrillation ([place]) 01/28/2014  . Acute pyelonephritis 01/28/2014  . Anemia due to blood loss 01/28/2014  . Atherosclerotic PVD with intermittent claudication ([place]) 11/28/2011  . Occlusion and stenosis of carotid artery without mention of cerebral infarction 11/28/2011  . Ischemic cardiomyopathy 09/02/2011  . PAD (peripheral artery disease) ([place]) 09/02/2011  . TOBACCO ABUSE 08/30/2009  . Coronary artery disease involving native coronary artery of native heart with angina pectoris ([place]) 08/30/2009  . Peripheral vascular  disease, unspecified ([place]) 08/30/2009  . Essential hypertension 08/29/2009  . COPD, severe - MS phenotype 08/29/2009  . TOBACCO ABUSE, HX OF 08/29/2009    Past Surgical History:  Procedure Laterality Date  . CARDIAC CATHETERIZATION    . CARDIAC CATHETERIZATION N/A 03/07/2016   Procedure: Left Heart Cath and Coronary Angiography;  Surgeon: Burnell Blanks, MD;  Location: [facility];  Service: Cardiovascular;  Laterality: N/A;  . CARDIOVERSION N/A 05/08/2014    Procedure: CARDIOVERSION;  Surgeon: Thayer Headings, MD;  Location: [facility];  Service: Cardiovascular;  Laterality: N/A;  . CORONARY STENT PLACEMENT  2011  . FEMORAL BYPASS Left May 2012  . INCISIONAL HERNIA REPAIR N/A 10/01/2015   Procedure: INCISIONAL HERNIA REPAIR;  Surgeon: Ralene Ok, MD;  Location: [facility];  Service: General;  Laterality: N/A;  . INSERTION OF MESH N/A 10/01/2015   Procedure: INSERTION OF MESH;  Surgeon: Ralene Ok, MD;  Location: [facility];  Service: General;  Laterality: N/A;  . LOWER EXTREMITY ANGIOGRAM  December 16, 2011  . LOWER EXTREMITY ANGIOGRAM N/A 12/16/2011   Procedure: LOWER EXTREMITY ANGIOGRAM;  Surgeon: Serafina Mitchell, MD;  Location[facility] C[facility];  Service: Cardiovascular;  Laterality: N/A;  . ROBOT ASSISTED LAPAROSCOPIC NEPHRECTOMY Left 03/22/2014   Procedure: ROBOTIC ASSISTED LAPAROSCOPIC RADICAL NEPHRECTOMY LEFT;  Surgeon: Alexis Frock, MD;  Location: [facility];  Service: Urology;  Laterality: Left;  . TONSILLECTOMY  65 years old    OB History    No data available       Home Medications    Prior to Admission medications   Medication Sig Start Date End Date Taking? Authorizing Provider  albuterol (PROVENTIL HFA;VENTOLIN HFA) 108 (90 Base) MCG/ACT inhaler Inhale 2 puffs into the lungs every 6 (six) hours as needed for wheezing or shortness of breath (shortness of breath). 07/26/15  Yes Tammy S Parrett, NP  apixaban (ELIQUIS) 5 MG TABS tablet Take 1 tablet (5 mg total) by mouth 2 (two) times daily. 01/24/16  Yes Burnell Blanks, MD  diphenhydramine-acetaminophen (TYLENOL PM) 25-500 MG TABS Take 2 tablets by mouth daily as needed (for sleep).    Yes Historical Provider, MD  escitalopram (LEXAPRO) 20 MG tablet Take 20 mg by mouth at bedtime.   Yes Historical Provider, MD  Fluticasone Furoate (ARNUITY ELLIPTA) 100 MCG/ACT AEPB Inhale 1 puff into the lungs daily. 03/03/16  Yes Brand Males, MD  furosemide (LASIX) 20 MG tablet TAKE 1 TABLET EVERY  MORNING. 03/04/16  Yes Burnell Blanks, MD  metoprolol (LOPRESSOR) 50 MG tablet Take 100 mg by mouth every morning. Take 75mg  by mouth in the evening   Yes Historical Provider, MD  potassium chloride (K-DUR,KLOR-CON) 10 MEQ tablet Take 10 mEq by mouth daily.   Yes Historical Provider, MD  simvastatin (ZOCOR) 20 MG tablet Take 20 mg by mouth daily at 6 PM.   Yes Historical Provider, MD  Tiotropium Bromide-Olodaterol (STIOLTO RESPIMAT) 2.5-2.5 MCG/ACT AERS Inhale 2 puffs into the lungs daily. 03/04/16  Yes Brand Males, MD    Family History Family History  Problem Relation Age of Onset  . Coronary artery disease Mother     deceased. coronary artery bypass graft  . Cancer Mother     BREAST  . Heart disease Mother     PVD  - Amputation-Leg  . Hyperlipidemia Mother   . Hypertension Mother   . Heart attack Mother   . Cirrhosis Father     deceased  . Cancer Father   .  Other Brother     alive, unknown hx  . Stroke Maternal Aunt     Social History Social History  Substance Use Topics  . Smoking status: Former Smoker    Packs/day: 1.50    Years: 30.00    Types: Cigarettes    Quit date: 08/12/2009  . Smokeless tobacco: Never Used     Comment: she has smoker 1-2 packs daily since the age of 89. 26 pack years  . Alcohol use No     Allergies   Ace inhibitors; Amoxicillin-pot clavulanate; and Codeine   Review of Systems Review of Systems 10/14 systems reviewed and are negative other than those stated in the HPI   Physical Exam Updated Vital Signs BP 162/84   Pulse 109   Temp 98.4 F (36.9 C) (Oral)   Resp 19   SpO2 98%   Physical Exam Physical Exam  Nursing note and vitals reviewed. Constitutional: Well developed, well nourished, non-toxic, and in no acute distress Head: Normocephalic and atraumatic.  Mouth/Throat: Oropharynx is clear and moist.  Neck: Normal range of motion. Neck supple.  Cardiovascular: Normal rate and regular rhythm.   Pulmonary/Chest:  Effort normal and breath sounds normal.  Abdominal: Soft. There is no tenderness. There is no rebound and no guarding.  Musculoskeletal: No deformity  Skin: Skin is warm and dry.  Psychiatric: Cooperative Neurological:  Alert, oriented to person, place, time, and situation. Memory grossly in tact. Fluent speech. No dysarthria or aphasia.  Cranial nerves: Pupils are symmetric, and reactive to light. EOMI without nystagmus. No gaze deviation. Facial muscles symmetric with activation. Sensation to light touch over face in tact bilaterally. Hearing grossly in tact. Palate elevates symmetrically. Head turn and shoulder shrug are intact. Tongue midline.  Reflexes defered.  Muscle bulk and tone normal. No pronator drift, but weak hand grip on right hand. RLE weakness 3+/5  Sensation to light touch diminished over right lower extremity Coordination reveals no dysmetria with finger to nose.    ED Treatments / Results  Labs (all labs ordered are listed, but only abnormal results are displayed) Labs Reviewed  PROTIME-INR - Abnormal; Notable for the following:       Result Value   Prothrombin Time 16.2 (*)    All other components within normal limits  APTT - Abnormal; Notable for the following:    aPTT 40 (*)    All other components within normal limits  CBC - Abnormal; Notable for the following:    WBC 12.4 (*)    Platelets 405 (*)    All other components within normal limits  DIFFERENTIAL - Abnormal; Notable for the following:    Neutro Abs 9.1 (*)    All other components within normal limits  COMPREHENSIVE METABOLIC PANEL - Abnormal; Notable for the following:    Potassium 3.3 (*)    Glucose, Bld 118 (*)    Creatinine, Ser 1.08 (*)    Alkaline Phosphatase 153 (*)    GFR calc non Af Amer 53 (*)    All other components within normal limits  CBG MONITORING, ED - Abnormal; Notable for the following:    Glucose-Capillary 115 (*)    All other components within normal limits  I-STAT CHEM  8, ED - Abnormal; Notable for the following:    Potassium 3.4 (*)    Glucose, Bld 114 (*)    Hemoglobin 15.6 (*)    All other components within normal limits  I-STAT TROPOININ, ED  CBG MONITORING, ED  EKG  EKG Interpretation  Date/Time:  Tuesday March 25 2016 19:52:03 EDT Ventricular Rate:  150 PR Interval:    QRS Duration: 80 QT Interval:  282 QTC Calculation: 445 R Axis:   66 Text Interpretation:  Atrial fibrillation with rapid ventricular response ST depression, consider subendocardial injury Abnormal ECG history of atrial fibrillation, RVR new Confirmed by Leronda Lewers MD, Ling Flesch [phone]) on 03/25/2016 8:29:29 PM       Radiology Ct Head Code Stroke W/o Cm  Result Date: 03/25/2016 CLINICAL DATA:  Code stroke. RIGHT-sided weakness and confusion, last seen normal at 1600 hours. History of metastatic renal cell carcinoma, atrial fibrillation. EXAM: CT HEAD WITHOUT CONTRAST TECHNIQUE: Contiguous axial images were obtained from the base of the skull through the vertex without intravenous contrast. COMPARISON:  CT HEAD January 01, 2016 FINDINGS: BRAIN: New LEFT centrum semiovale 10 mm rounded density with extensive surrounding hypodense vasogenic edema. New RIGHT frontal lobe subcortical 7 mm dense lesion with vasogenic edema. Local mass effect without midline shift. Moderate ventriculomegaly on the basis of global parenchymal brain volume loss, stable from prior imaging. No acute large vascular territory infarcts. No abnormal extra-axial fluid collections. Basal cisterns are patent . VASCULAR: Moderate calcific atherosclerosis of the carotid siphons. SKULL: No skull fracture. Multiple sub cm lytic lesions in the calvarium. No significant scalp soft tissue swelling. SINUSES/ORBITS: The mastoid air-cells and included paranasal sinuses are well-aerated.The included ocular globes and orbital contents are non-suspicious. OTHER: None. ASPECTS [facility] Stroke Program Early CT Score) - Ganglionic level  infarction (caudate, lentiform nuclei, internal capsule, insula, M1-M3 cortex): 7 - Supraganglionic infarction (M4-M6 cortex): 3 Total score (0-10 with 10 being normal): 10 IMPRESSION: 1. 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 No acute large vascular territory infarct. Sub cm lytic lesions in the calvarium, suspicious for osseous metastasis. 2. ASPECTS is 10. Acute findings discussed with and reconfirmed by Dr.Dr. Nicole Kindred, Neurology On 03/25/2016 at 8:20 pm. Electronically Signed   By: Elon Alas M.D.   On: 03/25/2016 20:24    Procedures Procedures (including critical care time) CRITICAL CARE Performed by: Forde Dandy   Total critical care time: 35 minutes  Critical care time was exclusive of separately billable procedures and treating other patients.  Critical care was necessary to treat or prevent imminent or life-threatening deterioration.  Critical care was time spent personally by me on the following activities: development of treatment plan with patient and/or surrogate as well as nursing, discussions with consultants, evaluation of patient's response to treatment, examination of patient, obtaining history from patient or surrogate, ordering and performing treatments and interventions, ordering and review of laboratory studies, ordering and review of radiographic studies, pulse oximetry and re-evaluation of patient's condition.  Medications Ordered in ED Medications  diltiazem (CARDIZEM) 100 mg in dextrose 5 % 100 mL (1 mg/mL) infusion (not administered)  diltiazem (CARDIZEM) injection 10 mg (10 mg Intravenous Given 03/25/16 2044)  dexamethasone (DECADRON) injection 10 mg (10 mg Intravenous Given 03/25/16 2044)     Initial Impression / Assessment and Plan / ED Course  I have reviewed the triage vital signs and the nursing notes.  Pertinent labs & imaging results that were available during my  care of the patient were reviewed by me and considered in my medical decision making (see chart for details).  Clinical Course   Code stroke activated in triage for New onset right upper and lower extremity weakness with numbness. Her CT  had however shows evidence of new subset her brain metastases explaining her symptoms. There is associating vasogenic edema. Given dose of Decadron. Dr. Nicole Kindred also at bedside to evaluate. Code stroke canceled. Plan to admit to hospitalist service for workup of brain metastasis.  She is also in atrial fibrillation with RVR and took her rate controlling medications today. Given bolus of diltiazem and placed on drip for rate control. Discussed with hospitalist service who will admit to stepdown for ongoing management.  Final Clinical Impressions(s) / ED Diagnoses   Final diagnoses:  Brain metastasis ([place])  Atrial fibrillation with RVR [facility])    New Prescriptions New Prescriptions   No medications on file     Forde Dandy, MD 03/25/16 2104

## 2016-03-25 NOTE — ED Triage Notes (Addendum)
Pt arrives with mild R sided weakness in arm and leg sudden onset 4PM this evening, family member at bedside reports difficulty ambulating. Symptoms improving per family member. Dr. Barbee Cough at bedside. AFIB with RVR in triage.

## 2016-03-26 ENCOUNTER — Other Ambulatory Visit: Payer: Self-pay | Admitting: Radiation Therapy

## 2016-03-26 ENCOUNTER — Ambulatory Visit
Admit: 2016-03-26 | Discharge: 2016-03-26 | Disposition: A | Discharge status: Home / Self Care | Payer: Medicaid Other | Attending: Radiation Oncology | Admitting: Radiation Oncology

## 2016-03-26 ENCOUNTER — Other Ambulatory Visit: Payer: Self-pay | Admitting: Oncology

## 2016-03-26 ENCOUNTER — Encounter: Payer: Self-pay | Admitting: Radiation Therapy

## 2016-03-26 DIAGNOSIS — J9611 Chronic respiratory failure with hypoxia: Secondary | ICD-10-CM | POA: Diagnosis not present

## 2016-03-26 DIAGNOSIS — Z823 Family history of stroke: Secondary | ICD-10-CM | POA: Diagnosis not present

## 2016-03-26 DIAGNOSIS — F419 Anxiety disorder, unspecified: Secondary | ICD-10-CM | POA: Diagnosis present

## 2016-03-26 DIAGNOSIS — I4891 Unspecified atrial fibrillation: Secondary | ICD-10-CM

## 2016-03-26 DIAGNOSIS — C7931 Secondary malignant neoplasm of brain: Secondary | ICD-10-CM | POA: Diagnosis present

## 2016-03-26 DIAGNOSIS — Z7951 Long term (current) use of inhaled steroids: Secondary | ICD-10-CM | POA: Diagnosis not present

## 2016-03-26 DIAGNOSIS — I255 Ischemic cardiomyopathy: Secondary | ICD-10-CM | POA: Diagnosis present

## 2016-03-26 DIAGNOSIS — I130 Hypertensive heart and chronic kidney disease with heart failure and stage 1 through stage 4 chronic kidney disease, or unspecified chronic kidney disease: Secondary | ICD-10-CM | POA: Diagnosis present

## 2016-03-26 DIAGNOSIS — I10 Essential (primary) hypertension: Secondary | ICD-10-CM

## 2016-03-26 DIAGNOSIS — C649 Malignant neoplasm of unspecified kidney, except renal pelvis: Secondary | ICD-10-CM

## 2016-03-26 DIAGNOSIS — Z7901 Long term (current) use of anticoagulants: Secondary | ICD-10-CM | POA: Diagnosis not present

## 2016-03-26 DIAGNOSIS — I739 Peripheral vascular disease, unspecified: Secondary | ICD-10-CM | POA: Diagnosis present

## 2016-03-26 DIAGNOSIS — I25119 Atherosclerotic heart disease of native coronary artery with unspecified angina pectoris: Secondary | ICD-10-CM

## 2016-03-26 DIAGNOSIS — G936 Cerebral edema: Secondary | ICD-10-CM | POA: Diagnosis present

## 2016-03-26 DIAGNOSIS — I252 Old myocardial infarction: Secondary | ICD-10-CM | POA: Diagnosis not present

## 2016-03-26 DIAGNOSIS — R531 Weakness: Secondary | ICD-10-CM | POA: Diagnosis present

## 2016-03-26 DIAGNOSIS — I481 Persistent atrial fibrillation: Secondary | ICD-10-CM | POA: Diagnosis present

## 2016-03-26 DIAGNOSIS — C7800 Secondary malignant neoplasm of unspecified lung: Principal | ICD-10-CM

## 2016-03-26 DIAGNOSIS — J449 Chronic obstructive pulmonary disease, unspecified: Secondary | ICD-10-CM | POA: Diagnosis present

## 2016-03-26 DIAGNOSIS — I5022 Chronic systolic (congestive) heart failure: Secondary | ICD-10-CM | POA: Diagnosis present

## 2016-03-26 DIAGNOSIS — N183 Chronic kidney disease, stage 3 (moderate): Secondary | ICD-10-CM | POA: Diagnosis present

## 2016-03-26 DIAGNOSIS — Z85528 Personal history of other malignant neoplasm of kidney: Secondary | ICD-10-CM | POA: Diagnosis not present

## 2016-03-26 DIAGNOSIS — Z888 Allergy status to other drugs, medicaments and biological substances status: Secondary | ICD-10-CM | POA: Diagnosis not present

## 2016-03-26 DIAGNOSIS — Z905 Acquired absence of kidney: Secondary | ICD-10-CM | POA: Diagnosis not present

## 2016-03-26 DIAGNOSIS — C7949 Secondary malignant neoplasm of other parts of nervous system: Principal | ICD-10-CM

## 2016-03-26 DIAGNOSIS — Z8249 Family history of ischemic heart disease and other diseases of the circulatory system: Secondary | ICD-10-CM | POA: Diagnosis not present

## 2016-03-26 DIAGNOSIS — Z955 Presence of coronary angioplasty implant and graft: Secondary | ICD-10-CM | POA: Diagnosis not present

## 2016-03-26 DIAGNOSIS — Z881 Allergy status to other antibiotic agents status: Secondary | ICD-10-CM | POA: Diagnosis not present

## 2016-03-26 DIAGNOSIS — Z809 Family history of malignant neoplasm, unspecified: Secondary | ICD-10-CM | POA: Diagnosis not present

## 2016-03-26 DIAGNOSIS — E785 Hyperlipidemia, unspecified: Secondary | ICD-10-CM | POA: Diagnosis present

## 2016-03-26 LAB — BASIC METABOLIC PANEL
ANION GAP: 9 (ref 5–15)
BUN: 20 mg/dL (ref 6–20)
CHLORIDE: 106 mmol/L (ref 101–111)
CO2: 25 mmol/L (ref 22–32)
Calcium: 9.9 mg/dL (ref 8.9–10.3)
Creatinine, Ser: 1.09 mg/dL — ABNORMAL HIGH (ref 0.44–1.00)
GFR calc Af Amer: >60 mL/min (ref >60)
GFR, EST NON AFRICAN AMERICAN: 53 mL/min — AB (ref >60)
Glucose, Bld: 176 mg/dL — ABNORMAL HIGH (ref 65–99)
POTASSIUM: 4.4 mmol/L (ref 3.5–5.1)
SODIUM: 140 mmol/L (ref 135–145)

## 2016-03-26 LAB — MAGNESIUM: MAGNESIUM: 2 mg/dL (ref 1.7–2.4)

## 2016-03-26 MED ORDER — SENNOSIDES-DOCUSATE SODIUM 8.6-50 MG PO TABS: 1.0000 | ORAL_TABLET | Freq: Two times a day (BID) | ORAL | 1 refills | Status: AC

## 2016-03-26 MED ORDER — GADOBENATE DIMEGLUMINE 529 MG/ML IV SOLN
20.0000 mL | Freq: Once | INTRAVENOUS | Status: AC
  Administered 2016-03-26: 18 mL via INTRAVENOUS

## 2016-03-26 MED ORDER — BUDESONIDE 0.25 MG/2ML IN SUSP
0.2500 mg | Freq: Two times a day (BID) | RESPIRATORY_TRACT | Status: DC
  Administered 2016-03-26 – 2016-03-27 (×3): 0.25 mg via RESPIRATORY_TRACT
  Filled 2016-03-26 (×3): qty 2

## 2016-03-26 MED ORDER — DILTIAZEM HCL 30 MG PO TABS
30.0000 mg | ORAL_TABLET | Freq: Four times a day (QID) | ORAL | Status: DC
  Administered 2016-03-26 – 2016-03-27 (×4): 30 mg via ORAL
  Filled 2016-03-26 (×4): qty 1

## 2016-03-26 MED ORDER — ATORVASTATIN CALCIUM 10 MG PO TABS
10.0000 mg | ORAL_TABLET | Freq: Every day | ORAL | Status: DC
  Administered 2016-03-26: 10 mg via ORAL
  Filled 2016-03-26: qty 1

## 2016-03-26 MED ORDER — DILTIAZEM HCL 25 MG/5ML IV SOLN
10.0000 mg | Freq: Once | INTRAVENOUS | Status: AC
  Administered 2016-03-26: 10 mg via INTRAVENOUS
  Filled 2016-03-26: qty 5

## 2016-03-26 MED ORDER — INFLUENZA VAC SPLIT QUAD 0.5 ML IM SUSY
0.5000 mL | PREFILLED_SYRINGE | INTRAMUSCULAR | Status: AC
Start: 2016-03-27 — End: 2016-03-27
  Administered 2016-03-27: 0.5 mL via INTRAMUSCULAR
  Filled 2016-03-26: qty 0.5

## 2016-03-26 NOTE — Progress Notes (Signed)
Patient had a 14 beat run of V tach. Patient sitting in her bed. Asymptomatic. On call made aware. No new orders given.

## 2016-03-26 NOTE — Progress Notes (Signed)
Events noted and MRI reviewed. Full note to follow.  Radiation oncology consult requested.

## 2016-03-26 NOTE — ED Notes (Signed)
Pt remains in MRI 

## 2016-03-26 NOTE — ED Notes (Signed)
Pt transferred via [facility] to [facility].

## 2016-03-26 NOTE — Progress Notes (Addendum)
PROGRESS NOTE    Allison [name]  [id number] DOB: Nov 21, 1950 DOA: 03/25/2016  PCP: [facility]   Brief Narrative:  Allison [name] is a 66 y.o. 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 Alen Blew, oncologist, persistent atrial fibrillation, CAD status post stenting, hyperlipidemia, COPD, who underwent cardiac cath in the beginning of this month (no stenosis) presents with right arm and leg weakness which she initially noted a few days ago. Head CT reveals 2 brain lesions with surrounding edema which are likely metastasis. She was also found to have A-fib with RVR and received a Cardizem bolus in the ER which was sufficient in initially controlling her HR.  Subjective: Feels her weakness is improved from when she was admitted. She has had no sensory deficits, visual changes of speech issues.   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 cancer for which she underwent a left nephrectomy in 9/15- has been followed by Dr Alen Blew who was continuing surveillance of lung nodules as well (which have been stable) - have notified her oncologist and radiation oncology - cont Decadron - PT eval  Active Problems:    Atrial fibrillation with RVR - asymptomatic therefore unable to tell if she was having RVR at home as well - she takes Lopressor 100 mg in AM and 75 in PM at home - have added short acting Cardizem for now- will switch to long acting once it is determined how much she will require in a 24 hr period - cont Eliquis  CAD - stable per last cath earlier this month  Chronic systolic CHF - EF 40 % per ECHO in 2/17 - compensated -  cont Lasix    Essential hypertension - Lopressor    Peripheral vascular disease, unspecified ([place])    COPD, severe  - stable- on Arneuity Ellipta and Stiolto Respimat at home  Mild renal insufficiency - appear acute -baseline Cr is  about 0.9-1- current is 1.09- follow    DVT prophylaxis: Eliquis Code Status: Full code Family Communication:   Disposition Plan: home in 1-2 days once HR is controlled Consultants:   radiation and medical oncology Procedures:    Antimicrobials:  Anti-infectives    None       Objective: Vitals:   03/26/16 1000 03/26/16 1139 03/26/16 1144 03/26/16 1724  BP: (!) 153/76  (!) 150/70 (!) 132/51  Pulse: (!) 130  (!) 110   Resp: 20  20   Temp:   98.5 F (36.9 C)   TempSrc:   Oral   SpO2: 97% 96% 98%   Weight:      Height:        Intake/Output Summary (Last 24 hours) at 03/26/16 1753 Last data filed at 03/26/16 0700  Gross per 24 hour  Intake              240 ml  Output                0 ml  Net              240 ml   Filed Weights   03/26/16 0117  Weight: 86.1 kg (189 lb 13.1 oz)    Examination: General exam: Appears comfortable  HEENT: PERRLA, oral mucosa moist, no sclera icterus or thrush Respiratory system: Clear to auscultation. Respiratory effort normal. Cardiovascular system: S1 & S2 heard, RRR.  No murmurs  Gastrointestinal system: Abdomen soft, non-tender, nondistended. Normal bowel sound. No organomegaly Central nervous system: Alert and oriented. 4/5 weakness in right arm and leg.  Extremities: No cyanosis, clubbing or edema Skin: No rashes or ulcers Psychiatry:  Mood & affect appropriate.     Data Reviewed: I have personally reviewed following labs and imaging studies  CBC:  Recent Labs Lab 03/25/16 2012  WBC 12.4*  NEUTROABS 9.1*  HGB 15.6*  14.6  HCT 46.0  45.1  MCV 90.4  PLT 123456*   Basic Metabolic Panel:  Recent Labs Lab 03/25/16 2012 03/26/16 1207  NA 139  142 140  K 3.3*  3.4* 4.4  CL 105  103 106  CO2 26 25  GLUCOSE 118*  114* 176*  BUN 11  12 20   CREATININE 1.08*  1.00 1.09*  CALCIUM 9.9 9.9  MG  --  2.0   GFR: Estimated Creatinine Clearance: 59.5 mL/min (by C-G formula based on SCr of 1.09 mg/dL (H)). Liver  Function Tests:  Recent Labs Lab 03/25/16 2012  AST 25  ALT 19  ALKPHOS 153*  BILITOT 0.5  PROT 8.1  ALBUMIN 3.8   No results for input(s): LIPASE, AMYLASE in the last 168 hours. No results for input(s): AMMONIA in the last 168 hours. Coagulation Profile:  Recent Labs Lab 03/25/16 2012  INR 1.29   Cardiac Enzymes: No results for input(s): CKTOTAL, CKMB, CKMBINDEX, TROPONINI in the last 168 hours. BNP (last 3 results) No results for input(s): PROBNP in the last 8760 hours. HbA1C: No results for input(s): HGBA1C in the last 72 hours. CBG:  Recent Labs Lab 03/25/16 1951  GLUCAP 115*   Lipid Profile: No results for input(s): CHOL, HDL, LDLCALC, TRIG, CHOLHDL, LDLDIRECT in the last 72 hours. Thyroid Function Tests: No results for input(s): TSH, T4TOTAL, FREET4, T3FREE, THYROIDAB in the last 72 hours. Anemia Panel: No results for input(s): VITAMINB12, FOLATE, FERRITIN, TIBC, IRON, RETICCTPCT in the last 72 hours. Urine analysis:    Component Value Date/Time   COLORURINE YELLOW 03/22/2014 1713   APPEARANCEUR CLOUDY (A) 03/22/2014 1713   LABSPEC 1.031 (H) 03/22/2014 1713   PHURINE 5.5 03/22/2014 1713   GLUCOSEU NEGATIVE 03/22/2014 1713   HGBUR SMALL (A) 03/22/2014 1713   BILIRUBINUR NEGATIVE 03/22/2014 1713   KETONESUR NEGATIVE 03/22/2014 1713   PROTEINUR 30 (A) 03/22/2014 1713   UROBILINOGEN 0.2 03/22/2014 1713   NITRITE NEGATIVE 03/22/2014 1713   LEUKOCYTESUR NEGATIVE 03/22/2014 1713   Sepsis Labs: @LABRCNTIP (procalcitonin:4,lacticidven:4) )No results found for this or any previous visit (from the past 240 hour(s)).       Radiology Studies: Mr Jeri Cos Wo Contrast  Result Date: 03/26/2016 CLINICAL DATA:  Initial evaluation for weakness of right-sided extremities. EXAM: MRI HEAD WITHOUT AND WITH CONTRAST TECHNIQUE: Multiplanar, multiecho pulse sequences of the brain and surrounding structures were obtained without and with intravenous contrast. CONTRAST:   26mL MULTIHANCE GADOBENATE DIMEGLUMINE 529 MG/ML IV SOLN COMPARISON:  Prior CT from earlier the same day as well as previous MRI from 01/30/2014. FINDINGS: Brain: Cerebral volume within normal limits. Mild chronic microvascular ischemic changes present within the periventricular white matter. There are 2 metastatic lesions. One positioned within the posterior left centrum semi ovale measures 11 x 13 x 15 mm (series 11, image 37). Surrounding vasogenic edema without midline shift. Small amount of chronic blood products/ necrosis on gradient echo sequence. Second metastatic lesion positioned within the subcortical white matter of the anterior right frontal lobe and measures 10 x 9 x 11 mm (  series 11, image 33). Surrounding vasogenic edema without significant mass effect. No associated hemorrhage. No other mass lesion or abnormal enhancement identified. No evidence for acute infarct. Gray-white matter differentiation otherwise maintained. No areas of chronic infarction. No midline shift. No hydrocephalus. Mild mass effect on the left lateral ventricle related to the left cerebral metastatic lesion. No extra-axial fluid collection. Major dural sinuses are grossly patent. Pituitary gland within normal limits. Vascular: Major intracranial vascular flow voids are maintained. Skull and upper cervical spine: Craniocervical junction normal. Visualized upper cervical spine unremarkable. Bone marrow signal intensity normal. No scalp soft tissue abnormality. Sinuses/Orbits: Globes and orbits within normal limits. Paranasal sinuses are clear. Trace opacity bilateral mastoid air cells. Inner ear structures grossly normal. Other: No other significant finding. IMPRESSION: Two metastatic lesions involving the anterior right frontal lobe and left centrum semi ovale as above. Associated vasogenic edema without midline shift. Electronically Signed   By: Jeannine Boga M.D.   On: 03/26/2016 01:07   Ct Head Code Stroke W/o  Cm  Result Date: 03/25/2016 CLINICAL DATA:  Code stroke. RIGHT-sided weakness and confusion, last seen normal at 1600 hours. History of metastatic renal cell carcinoma, atrial fibrillation. EXAM: CT HEAD WITHOUT CONTRAST TECHNIQUE: Contiguous axial images were obtained from the base of the skull through the vertex without intravenous contrast. COMPARISON:  CT HEAD January 01, 2016 FINDINGS: BRAIN: New LEFT centrum semiovale 10 mm rounded density with extensive surrounding hypodense vasogenic edema. New RIGHT frontal lobe subcortical 7 mm dense lesion with vasogenic edema. Local mass effect without midline shift. Moderate ventriculomegaly on the basis of global parenchymal brain volume loss, stable from prior imaging. No acute large vascular territory infarcts. No abnormal extra-axial fluid collections. Basal cisterns are patent . VASCULAR: Moderate calcific atherosclerosis of the carotid siphons. SKULL: No skull fracture. Multiple sub cm lytic lesions in the calvarium. No significant scalp soft tissue swelling. SINUSES/ORBITS: The mastoid air-cells and included paranasal sinuses are well-aerated.The included ocular globes and orbital contents are non-suspicious. OTHER: None. ASPECTS [facility] Stroke Program Early CT Score) - Ganglionic level infarction (caudate, lentiform nuclei, internal capsule, insula, M1-M3 cortex): 7 - Supraganglionic infarction (M4-M6 cortex): 3 Total score (0-10 with 10 being normal): 10 IMPRESSION: 1. 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 No acute large vascular territory infarct. Sub cm lytic lesions in the calvarium, suspicious for osseous metastasis. 2. ASPECTS is 10. Acute findings discussed with and reconfirmed by Dr.Dr. Nicole Kindred, Neurology On 03/25/2016 at 8:20 pm. Electronically Signed   By: Elon Alas M.D.   On: 03/25/2016 20:24      Scheduled Meds: . apixaban  5 mg Oral BID  .  atorvastatin  10 mg Oral q1800  . budesonide (PULMICORT) nebulizer solution  0.25 mg Nebulization BID  . dexamethasone  4 mg Intravenous Q6H  . diltiazem  30 mg Oral Q6H  . escitalopram  20 mg Oral QHS  . furosemide  20 mg Oral q morning - 10a  . [START ON 03/27/2016] Influenza vac split quadrivalent PF  0.5 mL Intramuscular Tomorrow-1000  . metoprolol  100 mg Oral Daily  . metoprolol tartrate  75 mg Oral QHS  . potassium chloride  10 mEq Oral Daily   Continuous Infusions:    LOS: 0 days    Time spent in minutes: 41 min    [place], MD [facility] Pager: [email or web address] Password [facility] 03/26/2016, 5:53 PM

## 2016-03-26 NOTE — Consult Note (Signed)
Radiation Oncology         [phone] ________________________________  Name: Allison [name] MRN: [id number]  Date: 03/26/16  DOB: 1950/07/19  CC:[facility]  No ref. provider found     REFERRING PHYSICIAN: No ref. provider found   DIAGNOSIS: The primary encounter diagnosis was Brain metastasis ([place]). Diagnoses of Atrial fibrillation with RVR ([place]) and Right sided weakness were also pertinent to this visit.   HISTORY OF PRESENT ILLNESS: Allison [name] is a 65 y.o. female seen at the request of Dr. Alen Blew. She was diagnosed with renal cell carcinoma in July 2015 and underwent left nephrectomy with Dr. Tresa Moore in September 2015. She has multiple comorbidities and as she was asympatomatic despite pulmonary disease, she elected to forgo systemic therapy. She has been followed with stability of disease with Dr. Alen Blew. In June 2017 she developed and episode of dizziness and a CT of the brain was negative. She has not had any additional episodes, but in the last few weeks, has noticed increasing weakness in her right upper and lower extremities, lower greater than upper. She developed foot drop and in the last day, developed loss of control of her extremities more abruptly and was brought for evaluation at [facility] ED. A CT of the brain with stroke protocol revealed two lesions in the frontal lobes. She had an MRI today with and without contrast of the brain revealing a 11 x 13 x 15 mm left semiovale lesion, and a 10 x 9 x 11 mm lesion in the white matter in the anterior right frontal lobe with surrounding vasogenic edema. No mass effect was noted. We are asked to see her to discuss the role for SRS. She has also developed RVR related to her atrial fibrillation and is awaiting Cardizem.     PREVIOUS RADIATION THERAPY: No   PAST MEDICAL HISTORY:  Past Medical History:  Diagnosis Date  . A-fib ([place])    a. Dx 01/2014 - rate controlled. b. Recurrence 03/2014 in setting of renal cell  carcinoma surgery.  . Acute pyelonephritis 01/28/2014  . Anxiety   . Carotid disease, bilateral ([place])    a. 11/2011 U/S: RICA [id number], LICA [id number].  Marland Kitchen COPD (chronic obstructive pulmonary disease) ([place])    uses O2 at night  . Coronary artery disease    a. 08/2009 NSTEMI s/p PCI/DES to LCX. Mod residual LAD dzs;  b. 02/2014 Nonischemic myoview.  Marland Kitchen Dysrhythmia    A-Fib  . History of kidney stones   . Hyperlipidemia   . Hypertension   . Ischemic cardiomyopathy    a. Echo 08/14/09 with LVEF 40-45%;  b. 01/2014 Echo: EF 55-60%, basal-mid inflat and inf HK, triv MR/TR, mod dil LA/RA.  Marland Kitchen Lung nodule    a. 1cm RLL nodule - followed by pulmonology.  . Metastatic renal cell carcinoma to lung ([place]) 01/29/2014  . Pneumonia    hx of  . PVD (peripheral vascular disease) ([place])    a. 11/2010 L Fem to above knee Pop bypass;  b. 12/2011 Angio: patent L fem->pop graft, LCFZ 50-60%.  . Shortness of breath dyspnea    with exertion  . Tobacco abuse    hx of       PAST SURGICAL HISTORY: Past Surgical History:  Procedure Laterality Date  . CARDIAC CATHETERIZATION    . CARDIAC CATHETERIZATION N/A 03/07/2016   Procedure: Left Heart Cath and Coronary Angiography;  Surgeon: Burnell Blanks, MD;  Location: [place] CV LAB;  Service: Cardiovascular;  Laterality: N/A;  . CARDIOVERSION N/A 05/08/2014   Procedure: CARDIOVERSION;  Surgeon: Thayer Headings, MD;  Location: [place];  Service: Cardiovascular;  Laterality: N/A;  . CORONARY STENT PLACEMENT  2011  . FEMORAL BYPASS Left May 2012  . INCISIONAL HERNIA REPAIR N/A 10/01/2015   Procedure: INCISIONAL HERNIA REPAIR;  Surgeon: Ralene Ok, MD;  Location: [place];  Service: General;  Laterality: N/A;  . INSERTION OF MESH N/A 10/01/2015   Procedure: INSERTION OF MESH;  Surgeon: Ralene Ok, MD;  Location: [place];  Service: General;  Laterality: N/A;  . LOWER EXTREMITY ANGIOGRAM  December 16, 2011  . LOWER EXTREMITY ANGIOGRAM N/A 12/16/2011   Procedure: LOWER  EXTREMITY ANGIOGRAM;  Surgeon: Serafina Mitchell, MD;  [facility] CATH LAB;  Service: Cardiovascular;  Laterality: N/A;  . ROBOT ASSISTED LAPAROSCOPIC NEPHRECTOMY Left 03/22/2014   Procedure: ROBOTIC ASSISTED LAPAROSCOPIC RADICAL NEPHRECTOMY LEFT;  Surgeon: Alexis Frock, MD;  Location: [facility];  Service: Urology;  Laterality: Left;  . TONSILLECTOMY  65 years old     FAMILY HISTORY:  Family History  Problem Relation Age of Onset  . Coronary artery disease Mother     deceased. coronary artery bypass graft  . Cancer Mother     BREAST  . Heart disease Mother     PVD  - Amputation-Leg  . Hyperlipidemia Mother   . Hypertension Mother   . Heart attack Mother   . Cirrhosis Father     deceased  . Cancer Father   . Other Brother     alive, unknown hx  . Stroke Maternal Aunt      SOCIAL HISTORY:  reports that she quit smoking about 6 years ago. Her smoking use included Cigarettes. She has a 45.00 pack-year smoking history. She has never used smokeless tobacco. She reports that she does not drink alcohol or use drugs.   ALLERGIES: Ace inhibitors; Amoxicillin-pot clavulanate; and Codeine   MEDICATIONS:  Current Facility-Administered Medications  Medication Dose Route Frequency Provider Last Rate Last Dose  . acetaminophen (TYLENOL) tablet 650 mg  650 mg Oral Q6H PRN Rise Patience, MD       Or  . acetaminophen (TYLENOL) suppository 650 mg  650 mg Rectal Q6H PRN Rise Patience, MD      . acetaminophen (TYLENOL) tablet 1,000 mg  1,000 mg Oral QHS PRN Rise Patience, MD   1,000 mg at 03/26/16 0136   And  . diphenhydrAMINE (BENADRYL) capsule 50 mg  50 mg Oral QHS PRN Rise Patience, MD   50 mg at 03/26/16 0136  . albuterol (PROVENTIL) (2.5 MG/3ML) 0.083% nebulizer solution 2.5 mg  2.5 mg Inhalation Q6H PRN Rise Patience, MD      . apixaban (ELIQUIS) tablet 5 mg  5 mg Oral BID Rise Patience, MD   5 mg at 03/26/16 1054  . budesonide (PULMICORT) nebulizer  solution 0.25 mg  0.25 mg Nebulization BID Rise Patience, MD      . dexamethasone (DECADRON) injection 4 mg  4 mg Intravenous Q6H Rise Patience, MD   4 mg at 03/26/16 0655  . escitalopram (LEXAPRO) tablet 20 mg  20 mg Oral QHS Rise Patience, MD   20 mg at 03/26/16 0135  . furosemide (LASIX) tablet 20 mg  20 mg Oral q morning - 10a Rise Patience, MD   20 mg at 03/26/16 1054  . [START ON 03/27/2016] Influenza vac split quadrivalent PF (FLUARIX) injection 0.5  mL  0.5 mL Intramuscular Tomorrow-1000 Rise Patience, MD      . metoprolol tartrate (LOPRESSOR) tablet 100 mg  100 mg Oral Daily Rise Patience, MD   100 mg at 03/26/16 1054  . metoprolol tartrate (LOPRESSOR) tablet 75 mg  75 mg Oral QHS Rise Patience, MD   75 mg at 03/26/16 0053  . ondansetron (ZOFRAN) tablet 4 mg  4 mg Oral Q6H PRN Rise Patience, MD       Or  . ondansetron [facility]) injection 4 mg  4 mg Intravenous Q6H PRN Rise Patience, MD      . potassium chloride (K-DUR,KLOR-CON) CR tablet 10 mEq  10 mEq Oral Daily Rise Patience, MD   10 mEq at 03/26/16 1054  . simvastatin (ZOCOR) tablet 20 mg  20 mg Oral q1800 Rise Patience, MD         REVIEW OF SYSTEMS: On review of systems, the patient reports that she is doing well overall since the steroids started. She is still receiving dexamethasone 4 mg q 6 hours IV. She  denies any chest pain, shortness of breath at rest, cough, fevers, chills, night sweats, unintended weight changes. She has shortness of breath due to COPD and uses O2 at home at night. She denies any bowel or bladder disturbances, and denies abdominal pain, nausea or vomiting. She denies any new musculoskeletal or joint aches or pains. A complete review of systems is obtained and is otherwise negative.     PHYSICAL EXAM:  height is 5\' 7"  (1.702 m) and weight is 189 lb 13.1 oz (86.1 kg). Her oral temperature is 97.7 F (36.5 C). Her blood pressure is 153/76 (abnormal)  and her pulse is 130 (abnormal). Her respiration is 20 and oxygen saturation is 97%.   Pain scale 0/10 In general this is a well appearing caucasian female in no acute distress. She is alert and oriented x4 and appropriate throughout the examination. HEENT reveals that the patient is normocephalic, atraumatic. EOMs are intact. PERRLA. Skin is intact without any evidence of gross lesions. Cardiovascular exam reveals a tachycardic rate and irregularly irregular rhythm, no clicks rubs or murmurs are auscultated. Chest is clear to auscultation bilaterally. Lymphatic assessment is performed and does not reveal any adenopathy in the cervical, supraclavicular, axillary, or inguinal chains. Abdomen has active bowel sounds in all quadrants and is intact. The abdomen is soft, non tender, non distended. Lower extremities are negative for pretibial pitting edema, deep calf tenderness, cyanosis or clubbing. Neurologic assessment is negative for focal sensory abnormalities. Cranial nerves II-XII are intact. She has 3/5 strength in the upper and lower extremities on the right, but 5/5 on the left. Grip strength is intact bilaterally. She is otherwise grossly intact.   ECOG = 3  0 - Asymptomatic (Fully active, able to carry on all predisease activities without restriction)  1 - Symptomatic but completely ambulatory (Restricted in physically strenuous activity but ambulatory and able to carry out work of a light or sedentary nature. For example, light housework, office work)  2 - Symptomatic, <50% in bed during the day (Ambulatory and capable of all self care but unable to carry out any work activities. Up and about more than 50% of waking hours)  3 - Symptomatic, >50% in bed, but not bedbound (Capable of only limited self-care, confined to bed or chair 50% or more of waking hours)  4 - Bedbound (Completely disabled. Cannot carry on any self-care. Totally confined to bed  or chair)  5 - Death   Eustace Pen MM, Creech RH,  Tormey DC, et al. [phone]). "Toxicity and response criteria of the [facility]". [place] Oncol. 5 (6): 649-55    LABORATORY DATA:  Lab Results  Component Value Date   WBC 12.4 (H) 03/25/2016   HGB 14.6 03/25/2016   HGB 15.6 (H) 03/25/2016   HCT 45.1 03/25/2016   HCT 46.0 03/25/2016   MCV 90.4 03/25/2016   PLT 405 (H) 03/25/2016   Lab Results  Component Value Date   NA 139 03/25/2016   NA 142 03/25/2016   K 3.3 (L) 03/25/2016   K 3.4 (L) 03/25/2016   CL 105 03/25/2016   CL 103 03/25/2016   CO2 26 03/25/2016   Lab Results  Component Value Date   ALT 19 03/25/2016   AST 25 03/25/2016   ALKPHOS 153 (H) 03/25/2016   BILITOT 0.5 03/25/2016      RADIOGRAPHY: Mr Jeri Cos [id number] Contrast  Result Date: 03/26/2016 CLINICAL DATA:  Initial evaluation for weakness of right-sided extremities. EXAM: MRI HEAD WITHOUT AND WITH CONTRAST TECHNIQUE: Multiplanar, multiecho pulse sequences of the brain and surrounding structures were obtained without and with intravenous contrast. CONTRAST:  15mL MULTIHANCE GADOBENATE DIMEGLUMINE 529 MG/ML IV SOLN COMPARISON:  Prior CT from earlier the same day as well as previous MRI from 01/30/2014. FINDINGS: Brain: Cerebral volume within normal limits. Mild chronic microvascular ischemic changes present within the periventricular white matter. There are 2 metastatic lesions. One positioned within the posterior left centrum semi ovale measures 11 x 13 x 15 mm (series 11, image 37). Surrounding vasogenic edema without midline shift. Small amount of chronic blood products/ necrosis on gradient echo sequence. Second metastatic lesion positioned within the subcortical white matter of the anterior right frontal lobe and measures 10 x 9 x 11 mm (series 11, image 33). Surrounding vasogenic edema without significant mass effect. No associated hemorrhage. No other mass lesion or abnormal enhancement identified. No evidence for acute infarct. Gray-white  matter differentiation otherwise maintained. No areas of chronic infarction. No midline shift. No hydrocephalus. Mild mass effect on the left lateral ventricle related to the left cerebral metastatic lesion. No extra-axial fluid collection. Major dural sinuses are grossly patent. Pituitary gland within normal limits. Vascular: Major intracranial vascular flow voids are maintained. Skull and upper cervical spine: Craniocervical junction normal. Visualized upper cervical spine unremarkable. Bone marrow signal intensity normal. No scalp soft tissue abnormality. Sinuses/Orbits: Globes and orbits within normal limits. Paranasal sinuses are clear. Trace opacity bilateral mastoid air cells. Inner ear structures grossly normal. Other: No other significant finding. IMPRESSION: Two metastatic lesions involving the anterior right frontal lobe and left centrum semi ovale as above. Associated vasogenic edema without midline shift. Electronically Signed   By: Jeannine Boga M.D.   On: 03/26/2016 01:07   Ct Head Code Stroke W/o Cm  Result Date: 03/25/2016 CLINICAL DATA:  Code stroke. RIGHT-sided weakness and confusion, last seen normal at 1600 hours. History of metastatic renal cell carcinoma, atrial fibrillation. EXAM: CT HEAD WITHOUT CONTRAST TECHNIQUE: Contiguous axial images were obtained from the base of the skull through the vertex without intravenous contrast. COMPARISON:  CT HEAD January 01, 2016 FINDINGS: BRAIN: New LEFT centrum semiovale 10 mm rounded density with extensive surrounding hypodense vasogenic edema. New RIGHT frontal lobe subcortical 7 mm dense lesion with vasogenic edema. Local mass effect without midline shift. Moderate ventriculomegaly on the basis of global parenchymal brain volume loss, stable from prior imaging.  No acute large vascular territory infarcts. No abnormal extra-axial fluid collections. Basal cisterns are patent . VASCULAR: Moderate calcific atherosclerosis of the carotid siphons.  SKULL: No skull fracture. Multiple sub cm lytic lesions in the calvarium. No significant scalp soft tissue swelling. SINUSES/ORBITS: The mastoid air-cells and included paranasal sinuses are well-aerated.The included ocular globes and orbital contents are non-suspicious. OTHER: None. ASPECTS [facility] Stroke Program Early CT Score) - Ganglionic level infarction (caudate, lentiform nuclei, internal capsule, insula, M1-M3 cortex): 7 - Supraganglionic infarction (M4-M6 cortex): 3 Total score (0-10 with 10 being normal): 10 IMPRESSION: 1. 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 No acute large vascular territory infarct. Sub cm lytic lesions in the calvarium, suspicious for osseous metastasis. 2. ASPECTS is 10. Acute findings discussed with and reconfirmed by Dr.Dr. Nicole Kindred, Neurology On 03/25/2016 at 8:20 pm. Electronically Signed   By: Elon Alas M.D.   On: 03/25/2016 20:24       IMPRESSION/PLAN: 1. Metastatic renal cell carcinoma to the brain. Dr. Tammi Klippel will evaluate her MRI findings, and we discussed the findings from her CT and MRI to date. We will arrange for a T3 MRI with SRS protocol provided that her cardiac stents are compatable with the T3 magnet. We will also arrange for neurosurgery consultation as well, and try to arrange these visits around her disposition for discharge. We discussed the risks, benefits, short, and long term effects of treatment and she is interested in [facility] if she remains a candidate based on imaging. If we cannot do a 3T MRI, we would reimage on the 1.5T machine with SRS protocol. She states agreement and understanding and we will continue planning for her treatment. We anticipate this will occur as an outpatient. 2. Anxiety during MRI. We will call in a prescription for Ativan 0.5 mg to her pharmacy to use one tablet po 30 minutes prior to MRI, or radiation treatment, and appreciate the staff  giving prn ativan if we are able to get her MRI before discharge. 3. A. Fib with RVR. We will follow along as internal medicine manages this, and plan treatment around her need for discharge versus remaining inpatient.  The above documentation reflects my direct findings during this shared patient visit. Please see the separate note by Dr. Lisbeth Renshaw on this date for the remainder of the patient's plan of care.    Carola Rhine, PAC

## 2016-03-26 NOTE — Progress Notes (Addendum)
1.  Do you need a wheel chair?    yes  2. On oxygen? no  3. Have you ever had any surgery in the body part being scanned?  no  4. Have you ever had any surgery on your brain or heart?     Heart cath in Feb 2011. Stent was placed at that time here in the [place]   5. surgery on your eyes or ears?   no  6. Do you have a pacemaker or defibrillator?   no  7. Do you have a Neurostimulator? no  9. Any risk for metal in eyes?no  10. Injury by bullet, buckshot, or shrapnel?  no  11. Stent?   Yes- Placed in Feb 2011- [place] stent. This information was verified by Wilburt Finlay, an MRI tech with [facility], and approved to be scanned on the 3T magnet.                                                                                                                               12. Hx of Cancer?        Yes- Renal cell with lung and brain mets.               106. Kidney or Liver disease?  - Lt kidney removed in September 2015 due to cancer.  14. Hx of Lupus, Rheumatoid Arthritis or Scleroderma?  no  15. IV Antibiotics or long term use of NSAIDS?  no  16. HX of Hypertension?  yes  17. Diabetes?no  18. Allergy to contrast?  no  19. Recent labs. Yes, in EPIC   Questions were answered by the patient on 03/26/16.   Mont Dutton R.T.(R)(T) Special Procedures Navigator  Pt has not had any previous radiation or chemo.  Rad Onc is Dr. Tammi Klippel and Med Onc is Dr. Alen Blew

## 2016-03-26 NOTE — Plan of Care (Signed)
Problem: Education: Goal: Knowledge of [place] [facility] Education information/materials will improve Outcome: Completed/Met Date Met: 03/26/16 Education provided regarding continued plan of care. Specifically discussed increased HR

## 2016-03-26 NOTE — Consult Note (Signed)
Reason for Referral: Brain metastasis.   HPI: 65 year old woman diagnosed with renal cell carcinoma in July 2015. She presented with a 6.4 cm left kidney mass at that time and underwent left nephrectomy in September 2015. The final pathology revealed a T3a clear cell histology with Fuhrman grade of III. she subsequently developed pulmonary nodules suspicious for metastatic disease although she deferred treatment on multiple occasions. Her most recent CT scan in May 2017 showed that her index lesion in the left upper lobe of the lung measuring 1.5 cm which was 1.6 in previous examination. The right lower lobe pulmonary nodules measuring 1.9 cm which was similar. There is also an 8 mm nodule which is also unchanged. Given the stability of those lesions we have deferred treatment at that time given the fact that she has multiple comorbid conditions and she was asymptomatic.  She presented on 03/25/2016 with weakness of her right side including upper and lower extremity. Her evaluation including CT scan of the brain which showed 2 areas of possible metastatic disease in the frontal lobe. MRI of the brain showed 2 metastatic lesions. One positioned in the posterior left measuring 11 x 13 x 15 mm. And the other was in the right frontal lobe measuring 10 x 9 x 11 mm. Vasogenic edema was noted.  She was started on dexamethasone and her symptoms are indeed improving. She did not have any seizures or any other neurological complaints. She does not report any headaches or blurry vision. Her upper and lower extremity weakness is indeed improving.   She is not report any fevers, chills, sweats or weight loss. She does not report any chest pain, palpitation, orthopnea or leg edema. She does not report any cough, wheezing or hemoptysis. She does not report any nausea, vomiting or abdominal pain. She does not report any frequency urgency or hesitancy. She has not reported a slow complaints. Remaining review of systems  unremarkable.   Past Medical History:  Diagnosis Date  . A-fib ([place])    a. Dx 01/2014 - rate controlled. b. Recurrence 03/2014 in setting of renal cell carcinoma surgery.  . Acute pyelonephritis 01/28/2014  . Anxiety   . Carotid disease, bilateral ([place])    a. 11/2011 U/S: RICA 123XX123, LICA [id number].  Marland Kitchen COPD (chronic obstructive pulmonary disease) ([place])    uses O2 at night  . Coronary artery disease    a. 08/2009 NSTEMI s/p PCI/DES to LCX. Mod residual LAD dzs;  b. 02/2014 Nonischemic myoview.  Marland Kitchen Dysrhythmia    A-Fib  . History of kidney stones   . Hyperlipidemia   . Hypertension   . Ischemic cardiomyopathy    a. Echo 08/14/09 with LVEF 40-45%;  b. 01/2014 Echo: EF 55-60%, basal-mid inflat and inf HK, triv MR/TR, mod dil LA/RA.  Marland Kitchen Lung nodule    a. 1cm RLL nodule - followed by pulmonology.  . Metastatic renal cell carcinoma to lung ([place]) 01/29/2014  . Pneumonia    hx of  . PVD (peripheral vascular disease) ([place])    a. 11/2010 L Fem to above knee Pop bypass;  b. 12/2011 Angio: patent L fem->pop graft, LCFZ 50-60%.  . Shortness of breath dyspnea    with exertion  . Tobacco abuse    hx of  :  Past Surgical History:  Procedure Laterality Date  . CARDIAC CATHETERIZATION    . CARDIAC CATHETERIZATION N/A 03/07/2016   Procedure: Left Heart Cath and Coronary Angiography;  Surgeon: Burnell Blanks, MD;  Location: [facility];  Service: Cardiovascular;  Laterality: N/A;  . CARDIOVERSION N/A 05/08/2014   Procedure: CARDIOVERSION;  Surgeon: Thayer Headings, MD;  Location: [facility];  Service: Cardiovascular;  Laterality: N/A;  . CORONARY STENT PLACEMENT  2011  . FEMORAL BYPASS Left May 2012  . INCISIONAL HERNIA REPAIR N/A 10/01/2015   Procedure: INCISIONAL HERNIA REPAIR;  Surgeon: Ralene Ok, MD;  Location: [facility];  Service: General;  Laterality: N/A;  . INSERTION OF MESH N/A 10/01/2015   Procedure: INSERTION OF MESH;  Surgeon: Ralene Ok, MD;  Location: [facility];  Service: General;   Laterality: N/A;  . LOWER EXTREMITY ANGIOGRAM  December 16, 2011  . LOWER EXTREMITY ANGIOGRAM N/A 12/16/2011   Procedure: LOWER EXTREMITY ANGIOGRAM;  Surgeon: Serafina Mitchell, MD;  Location: [facility];  Service: Cardiovascular;  Laterality: N/A;  . ROBOT ASSISTED LAPAROSCOPIC NEPHRECTOMY Left 03/22/2014   Procedure: ROBOTIC ASSISTED LAPAROSCOPIC RADICAL NEPHRECTOMY LEFT;  Surgeon: Alexis Frock, MD;  Location: [facility];  Service: Urology;  Laterality: Left;  . TONSILLECTOMY  65 years old  :   Current Facility-Administered Medications:  .  acetaminophen (TYLENOL) tablet 650 mg, 650 mg, Oral, Q6H PRN **OR** acetaminophen (TYLENOL) suppository 650 mg, 650 mg, Rectal, Q6H PRN, Rise Patience, MD .  acetaminophen (TYLENOL) tablet 1,000 mg, 1,000 mg, Oral, QHS PRN, 1,000 mg at 03/26/16 0136 **AND** diphenhydrAMINE (BENADRYL) capsule 50 mg, 50 mg, Oral, QHS PRN, Rise Patience, MD, 50 mg at 03/26/16 0136 .  albuterol (PROVENTIL) (2.5 MG/3ML) 0.083% nebulizer solution 2.5 mg, 2.5 mg, Inhalation, Q6H PRN, Rise Patience, MD .  apixaban (ELIQUIS) tablet 5 mg, 5 mg, Oral, BID, Rise Patience, MD, 5 mg at 03/26/16 0053 .  budesonide (PULMICORT) nebulizer solution 0.25 mg, 0.25 mg, Nebulization, BID, Rise Patience, MD .  dexamethasone (DECADRON) injection 4 mg, 4 mg, Intravenous, Q6H, Rise Patience, MD, 4 mg at 03/26/16 0655 .  escitalopram (LEXAPRO) tablet 20 mg, 20 mg, Oral, QHS, Rise Patience, MD, 20 mg at 03/26/16 0135 .  furosemide (LASIX) tablet 20 mg, 20 mg, Oral, q morning - 10a, Rise Patience, MD .  Derrill Memo ON 03/27/2016] Influenza vac split quadrivalent PF (FLUARIX) injection 0.5 mL, 0.5 mL, Intramuscular, Tomorrow-1000, Rise Patience, MD .  metoprolol tartrate (LOPRESSOR) tablet 100 mg, 100 mg, Oral, Daily, Rise Patience, MD .  metoprolol tartrate (LOPRESSOR) tablet 75 mg, 75 mg, Oral, QHS, Rise Patience, MD, 75 mg at 03/26/16 0053 .   ondansetron (ZOFRAN) tablet 4 mg, 4 mg, Oral, Q6H PRN **OR** ondansetron (ZOFRAN) injection 4 mg, 4 mg, Intravenous, Q6H PRN, Rise Patience, MD .  potassium chloride (K-DUR,KLOR-CON) CR tablet 10 mEq, 10 mEq, Oral, Daily, Rise Patience, MD .  simvastatin (ZOCOR) tablet 20 mg, 20 mg, Oral, q1800, Rise Patience, MD:  Allergies  Allergen Reactions  . Ace Inhibitors Cough  . Amoxicillin-Pot Clavulanate Hives  . Codeine Nausea And Vomiting  :  Family History  Problem Relation Age of Onset  . Coronary artery disease Mother     deceased. coronary artery bypass graft  . Cancer Mother     BREAST  . Heart disease Mother     PVD  - Amputation-Leg  . Hyperlipidemia Mother   . Hypertension Mother   . Heart attack Mother   . Cirrhosis Father     deceased  . Cancer Father   . Other Brother     alive, unknown hx  . Stroke Maternal  Aunt   :  Social History   Social History  . Marital status: Married    Spouse name: N/A  . Number of children: 3  . Years of education: N/A   Occupational History  . sculptor Unemployed   Social History Main Topics  . Smoking status: Former Smoker    Packs/day: 1.50    Years: 30.00    Types: Cigarettes    Quit date: 08/12/2009  . Smokeless tobacco: Never Used     Comment: she has smoker 1-2 packs daily since the age of 77. 24 pack years  . Alcohol use No  . Drug use: No  . Sexual activity: Not on file   Other Topics Concern  . Not on file   Social History Narrative  . No narrative on file  :  Pertinent items are noted in HPI.  Exam: Blood pressure 123/61, pulse 97, temperature 97.7 F (36.5 C), temperature source Oral, resp. rate 20, height 5\' 7"  (1.702 m), weight 189 lb 13.1 oz (86.1 kg), SpO2 97 %. General appearance: alert and cooperative Throat: No oral thrush noted. Neck: no adenopathy Back: negative Resp: clear to auscultation bilaterally Chest wall: no tenderness Cardio: Irregular heart rate tachycardic. GI:  soft, non-tender; bowel sounds normal; no masses,  no organomegaly Extremities: extremities normal, atraumatic, no cyanosis or edema Pulses: 2+ and symmetric   Recent Labs  03/25/16 2012  WBC 12.4*  HGB 15.6*  14.6  HCT 46.0  45.1  PLT 405*    Recent Labs  03/25/16 2012  NA 139  142  K 3.3*  3.4*  CL 105  103  CO2 26  GLUCOSE 118*  114*  BUN 11  12  CREATININE 1.08*  1.00  CALCIUM 9.9      Mr Jeri Cos Wo Contrast  Result Date: 03/26/2016 CLINICAL DATA:  Initial evaluation for weakness of right-sided extremities. EXAM: MRI HEAD WITHOUT AND WITH CONTRAST TECHNIQUE: Multiplanar, multiecho pulse sequences of the brain and surrounding structures were obtained without and with intravenous contrast. CONTRAST:  14mL MULTIHANCE GADOBENATE DIMEGLUMINE 529 MG/ML IV SOLN COMPARISON:  Prior CT from earlier the same day as well as previous MRI from 01/30/2014. FINDINGS: Brain: Cerebral volume within normal limits. Mild chronic microvascular ischemic changes present within the periventricular white matter. There are 2 metastatic lesions. One positioned within the posterior left centrum semi ovale measures 11 x 13 x 15 mm (series 11, image 37). Surrounding vasogenic edema without midline shift. Small amount of chronic blood products/ necrosis on gradient echo sequence. Second metastatic lesion positioned within the subcortical white matter of the anterior right frontal lobe and measures 10 x 9 x 11 mm (series 11, image 33). Surrounding vasogenic edema without significant mass effect. No associated hemorrhage. No other mass lesion or abnormal enhancement identified. No evidence for acute infarct. Gray-white matter differentiation otherwise maintained. No areas of chronic infarction. No midline shift. No hydrocephalus. Mild mass effect on the left lateral ventricle related to the left cerebral metastatic lesion. No extra-axial fluid collection. Major dural sinuses are grossly patent. Pituitary  gland within normal limits. Vascular: Major intracranial vascular flow voids are maintained. Skull and upper cervical spine: Craniocervical junction normal. Visualized upper cervical spine unremarkable. Bone marrow signal intensity normal. No scalp soft tissue abnormality. Sinuses/Orbits: Globes and orbits within normal limits. Paranasal sinuses are clear. Trace opacity bilateral mastoid air cells. Inner ear structures grossly normal. Other: No other significant finding. IMPRESSION: Two metastatic lesions involving the anterior right frontal lobe and  left centrum semi ovale as above. Associated vasogenic edema without midline shift. Electronically Signed   By: Jeannine Boga M.D.   On: 03/26/2016 01:07   Ct Head Code Stroke W/o Cm  Result Date: 03/25/2016 CLINICAL DATA:  Code stroke. RIGHT-sided weakness and confusion, last seen normal at 1600 hours. History of metastatic renal cell carcinoma, atrial fibrillation. EXAM: CT HEAD WITHOUT CONTRAST TECHNIQUE: Contiguous axial images were obtained from the base of the skull through the vertex without intravenous contrast. COMPARISON:  CT HEAD January 01, 2016 FINDINGS: BRAIN: New LEFT centrum semiovale 10 mm rounded density with extensive surrounding hypodense vasogenic edema. New RIGHT frontal lobe subcortical 7 mm dense lesion with vasogenic edema. Local mass effect without midline shift. Moderate ventriculomegaly on the basis of global parenchymal brain volume loss, stable from prior imaging. No acute large vascular territory infarcts. No abnormal extra-axial fluid collections. Basal cisterns are patent . VASCULAR: Moderate calcific atherosclerosis of the carotid siphons. SKULL: No skull fracture. Multiple sub cm lytic lesions in the calvarium. No significant scalp soft tissue swelling. SINUSES/ORBITS: The mastoid air-cells and included paranasal sinuses are well-aerated.The included ocular globes and orbital contents are non-suspicious. OTHER: None. ASPECTS  [facility] Stroke Program Early CT Score) - Ganglionic level infarction (caudate, lentiform nuclei, internal capsule, insula, M1-M3 cortex): 7 - Supraganglionic infarction (M4-M6 cortex): 3 Total score (0-10 with 10 being normal): 10 IMPRESSION: 1. 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 No acute large vascular territory infarct. Sub cm lytic lesions in the calvarium, suspicious for osseous metastasis. 2. ASPECTS is 10. Acute findings discussed with and reconfirmed by Dr.Dr. Nicole Kindred, Neurology On 03/25/2016 at 8:20 pm. Electronically Signed   By: Elon Alas M.D.   On: 03/25/2016 20:24    Assessment and Plan:   65 year old woman with the following issues:  1. Renal cell carcinoma diagnosed in 2015 now with pulmonary metastasis. These findings are not new and her pulmonary nodules have been monitored and appeared to be stable over the last few scans. Systemic therapy has been deferred because of lack of symptoms and patient's preference. This will be reconsidered in the future given her recent brain metastasis. She understands that systemic therapy will likely not prevent any further CNS metastasis because of blood brain barrier. This will be addressed in future visits after her CNS disease is treated.  2. Brain metastasis: Documented on MRI on 03/26/2016 associated with vasogenic edema. I agree with dexamethasone treatment and I have asked Dr. Tammi Klippel radiation oncology to review her scan and to see whether she would be a candidate for stereotactic radiosurgery. The alternative would be also neurosurgical evaluation and resection of these lesions. She does have quite a bit of comorbid conditions including coronary disease as well as COPD which might be prohibitive.  She would benefit from a multidisciplinary discussion regarding her new brain metastasis.  3. Disposition: I have no objections for discharge on oral  dexamethasone once she is stable from a cardiovascular standpoint.

## 2016-03-27 ENCOUNTER — Other Ambulatory Visit: Payer: Self-pay | Admitting: Radiation Oncology

## 2016-03-27 DIAGNOSIS — C649 Malignant neoplasm of unspecified kidney, except renal pelvis: Secondary | ICD-10-CM

## 2016-03-27 DIAGNOSIS — C7800 Secondary malignant neoplasm of unspecified lung: Principal | ICD-10-CM

## 2016-03-27 DIAGNOSIS — C7931 Secondary malignant neoplasm of brain: Secondary | ICD-10-CM

## 2016-03-27 DIAGNOSIS — J9611 Chronic respiratory failure with hypoxia: Secondary | ICD-10-CM

## 2016-03-27 LAB — BASIC METABOLIC PANEL
ANION GAP: 9 (ref 5–15)
BUN: 27 mg/dL — ABNORMAL HIGH (ref 6–20)
CALCIUM: 9.7 mg/dL (ref 8.9–10.3)
CO2: 25 mmol/L (ref 22–32)
CREATININE: 1.08 mg/dL — AB (ref 0.44–1.00)
Chloride: 107 mmol/L (ref 101–111)
GFR, EST NON AFRICAN AMERICAN: 53 mL/min — AB (ref >60)
GLUCOSE: 171 mg/dL — AB (ref 65–99)
Potassium: 3.8 mmol/L (ref 3.5–5.1)
Sodium: 141 mmol/L (ref 135–145)

## 2016-03-27 LAB — T4, FREE: Free T4: 0.76 ng/dL (ref 0.61–1.12)

## 2016-03-27 LAB — TSH: TSH: 0.476 u[IU]/mL (ref 0.350–4.500)

## 2016-03-27 MED ORDER — DILTIAZEM HCL ER COATED BEADS 120 MG PO CP24: 120.0000 mg | ORAL_CAPSULE | Freq: Every day | ORAL | 0 refills | Status: DC

## 2016-03-27 MED ORDER — DEXAMETHASONE 4 MG PO TABS
4.0000 mg | ORAL_TABLET | Freq: Four times a day (QID) | ORAL | Status: DC
  Administered 2016-03-27: 4 mg via ORAL
  Filled 2016-03-27 (×2): qty 1

## 2016-03-27 MED ORDER — DILTIAZEM HCL ER COATED BEADS 120 MG PO CP24
120.0000 mg | ORAL_CAPSULE | Freq: Every day | ORAL | Status: DC
  Administered 2016-03-27: 120 mg via ORAL
  Filled 2016-03-27: qty 1

## 2016-03-27 MED ORDER — LORAZEPAM 0.5 MG PO TABS: ORAL_TABLET | ORAL | 0 refills | Status: DC

## 2016-03-27 MED ORDER — DEXAMETHASONE 4 MG PO TABS: 4.0000 mg | ORAL_TABLET | Freq: Four times a day (QID) | ORAL | 0 refills | Status: DC

## 2016-03-27 NOTE — Care Management Note (Signed)
Case Management Note  Patient Details  Name: JUSTIN BROWNFIELD MRN: [id number] Date of Birth: 21-Nov-1950  Subjective/Objective: 65 y/o f admitted w/brain mets,afib w/rvr. From home. Has home [employer]. Patient declines any HHC.                   Action/Plan:d/c home no needs or orders.   Expected Discharge Date:                  Expected Discharge Plan:  Home/Self Care  In-House Referral:     Discharge planning Services  CM Consult  Post Acute Care Choice:    Choice offered to:     DME Arranged:    DME Agency:     HH Arranged:    [place] Agency:     Status of Service:  Completed, signed off  If discussed at [employer] of Stay Meetings, dates discussed:    Additional Comments:  Dessa Phi, RN 03/27/2016, 3:00 PM

## 2016-03-27 NOTE — Discharge Summary (Signed)
Physician Discharge Summary  NATILIE DIEHR [id number] DOB: 04/30/1951 DOA: 03/25/2016  PCP: [facility]  Admit date: 03/25/2016 Discharge date: 03/27/2016  Admitted From: home  Disposition:  home   Recommendations for Outpatient Follow-up:  1. Taper steroids  Home Health:  PT ordered  Equipment/Devices:  walker    Discharge Condition:  stable   CODE STATUS:  Full code   Diet recommendation:  Heart healthy Consultations:  Medical and radiation oncology    Discharge Diagnoses:  Principal Problem:   Brain metastases ([place]) Active Problems:   Atrial fibrillation with RVR ([place])   Essential hypertension   Coronary artery disease involving native coronary artery of native heart with angina pectoris (HCC)   Peripheral vascular disease, unspecified (HCC)   COPD, severe - MS phenotype   CKD (chronic kidney disease), stage III   Chronic respiratory failure (HCC)    Subjective: No complaints today. Is walking to the bathroom without much trouble in gait.   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 Braulio Bosch atrial fibrillation, CAD status post stenting, hyperlipidemia, COPD, who underwent cardiac cath in the beginning of this month (no stenosis) presents with right arm and leg weakness which she initially noted a few days ago. Head CT reveals 2 brain lesions with surrounding edema which are likely metastasis. She was also found to have A-fib with RVR and received a Cardizem bolus in the ER which was sufficient in initially controlling her HR.  Hospital Course:  Principal Problem:   Brain metastasis  - likely due to renal cancer for which she underwent a left nephrectomy in 9/15- has been followed by Dr Alen Blew who was continuing surveillance of lung nodules as well (which have been stable) - have notified her oncologist and radiation  oncology- she has been evaluated by Dr Alen Blew and Dr Tammi Klippel- likely will begin radiation next week - cont Decadron QID - have ordered home health PT- right arm strength back to normal - right leg is still mildly weak- she has a walker at home  Active Problems:    Atrial fibrillation with RVR - asymptomatic therefore unable to tell if she was having RVR at home as well - she takes Lopressor 100 mg in AM and 75 in PM at home - started short acting Cardizem yesterday and switched to long acting today at a dose of 120 mg- HR well controlled in 80s over night and today  - she has an appt with her cardiology coming up in 1-2 wks - cont Eliquis  CAD - stable per last cath earlier this month  Chronic systolic CHF - EF 40 % per ECHO in 2/17 - compensated -  cont Lasix    Essential hypertension - Lopressor    Peripheral vascular disease, unspecified      COPD, severe  - stable- on Arneuity Ellipta and Stiolto Respimat at home  Mild renal insufficiency - appear acute -baseline Cr is about 0.9-1- current is 1.08     Discharge Instructions  Discharge Instructions    Diet - low sodium heart healthy    Complete by:  As directed    Increase activity slowly    Complete by:  As directed        Medication List    TAKE these medications   albuterol 108 (90 Base) MCG/ACT inhaler Commonly known as:  PROVENTIL HFA;VENTOLIN HFA Inhale 2 puffs into the lungs every 6 (  six) hours as needed for wheezing or shortness of breath (shortness of breath).   apixaban 5 MG Tabs tablet Commonly known as:  ELIQUIS Take 1 tablet (5 mg total) by mouth 2 (two) times daily.   dexamethasone 4 MG tablet Commonly known as:  DECADRON Take 1 tablet (4 mg total) by mouth 4 (four) times daily.   diltiazem 120 MG 24 hr capsule Commonly known as:  CARDIZEM CD Take 1 capsule (120 mg total) by mouth daily.   diphenhydramine-acetaminophen 25-500 MG Tabs tablet Commonly known as:  TYLENOL  PM Take 2 tablets by mouth daily as needed (for sleep).   escitalopram 20 MG tablet Commonly known as:  LEXAPRO Take 20 mg by mouth at bedtime.   Fluticasone Furoate 100 MCG/ACT Aepb Commonly known as:  ARNUITY ELLIPTA Inhale 1 puff into the lungs daily.   furosemide 20 MG tablet Commonly known as:  LASIX TAKE 1 TABLET EVERY MORNING.   metoprolol 50 MG tablet Commonly known as:  LOPRESSOR Take 100 mg by mouth every morning. Take 75mg  by mouth in the evening   potassium chloride 10 MEQ tablet Commonly known as:  K-DUR,KLOR-CON Take 10 mEq by mouth daily.   senna-docusate 8.6-50 MG tablet Commonly known as:  Senokot-S Take 1 tablet by mouth 2 (two) times daily.   simvastatin 20 MG tablet Commonly known as:  ZOCOR Take 20 mg by mouth daily at 6 PM.   Tiotropium Bromide-Olodaterol 2.5-2.5 MCG/ACT Aers Commonly known as:  STIOLTO RESPIMAT Inhale 2 puffs into the lungs daily.       Allergies  Allergen Reactions  . Ace Inhibitors Cough  . Amoxicillin-Pot Clavulanate Hives  . Codeine Nausea And Vomiting     Procedures/Studies:   Mr Jeri Cos Wo Contrast  Result Date: 03/26/2016 CLINICAL DATA:  Initial evaluation for weakness of right-sided extremities. EXAM: MRI HEAD WITHOUT AND WITH CONTRAST TECHNIQUE: Multiplanar, multiecho pulse sequences of the brain and surrounding structures were obtained without and with intravenous contrast. CONTRAST:  7mL MULTIHANCE GADOBENATE DIMEGLUMINE 529 MG/ML IV SOLN COMPARISON:  Prior CT from earlier the same day as well as previous MRI from 01/30/2014. FINDINGS: Brain: Cerebral volume within normal limits. Mild chronic microvascular ischemic changes present within the periventricular white matter. There are 2 metastatic lesions. One positioned within the posterior left centrum semi ovale measures 11 x 13 x 15 mm (series 11, image 37). Surrounding vasogenic edema without midline shift. Small amount of chronic blood products/ necrosis on  gradient echo sequence. Second metastatic lesion positioned within the subcortical white matter of the anterior right frontal lobe and measures 10 x 9 x 11 mm (series 11, image 33). Surrounding vasogenic edema without significant mass effect. No associated hemorrhage. No other mass lesion or abnormal enhancement identified. No evidence for acute infarct. Gray-white matter differentiation otherwise maintained. No areas of chronic infarction. No midline shift. No hydrocephalus. Mild mass effect on the left lateral ventricle related to the left cerebral metastatic lesion. No extra-axial fluid collection. Major dural sinuses are grossly patent. Pituitary gland within normal limits. Vascular: Major intracranial vascular flow voids are maintained. Skull and upper cervical spine: Craniocervical junction normal. Visualized upper cervical spine unremarkable. Bone marrow signal intensity normal. No scalp soft tissue abnormality. Sinuses/Orbits: Globes and orbits within normal limits. Paranasal sinuses are clear. Trace opacity bilateral mastoid air cells. Inner ear structures grossly normal. Other: No other significant finding. IMPRESSION: Two metastatic lesions involving the anterior right frontal lobe and left centrum semi ovale as above. Associated vasogenic  edema without midline shift. Electronically Signed   By: Jeannine Boga M.D.   On: 03/26/2016 01:07   Ct Head Code Stroke W/o Cm  Result Date: 03/25/2016 CLINICAL DATA:  Code stroke. RIGHT-sided weakness and confusion, last seen normal at 1600 hours. History of metastatic renal cell carcinoma, atrial fibrillation. EXAM: CT HEAD WITHOUT CONTRAST TECHNIQUE: Contiguous axial images were obtained from the base of the skull through the vertex without intravenous contrast. COMPARISON:  CT HEAD January 01, 2016 FINDINGS: BRAIN: New LEFT centrum semiovale 10 mm rounded density with extensive surrounding hypodense vasogenic edema. New RIGHT frontal lobe subcortical 7 mm  dense lesion with vasogenic edema. Local mass effect without midline shift. Moderate ventriculomegaly on the basis of global parenchymal brain volume loss, stable from prior imaging. No acute large vascular territory infarcts. No abnormal extra-axial fluid collections. Basal cisterns are patent . VASCULAR: Moderate calcific atherosclerosis of the carotid siphons. SKULL: No skull fracture. Multiple sub cm lytic lesions in the calvarium. No significant scalp soft tissue swelling. SINUSES/ORBITS: The mastoid air-cells and included paranasal sinuses are well-aerated.The included ocular globes and orbital contents are non-suspicious. OTHER: None. ASPECTS [facility] Stroke Program Early CT Score) - Ganglionic level infarction (caudate, lentiform nuclei, internal capsule, insula, M1-M3 cortex): 7 - Supraganglionic infarction (M4-M6 cortex): 3 Total score (0-10 with 10 being normal): 10 IMPRESSION: 1. 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 No acute large vascular territory infarct. Sub cm lytic lesions in the calvarium, suspicious for osseous metastasis. 2. ASPECTS is 10. Acute findings discussed with and reconfirmed by Dr.Dr. Nicole Kindred, Neurology On 03/25/2016 at 8:20 pm. Electronically Signed   By: Elon Alas M.D.   On: 03/25/2016 20:24       Discharge Exam: Vitals:   03/27/16 0553 03/27/16 0946  BP: 131/60 131/69  Pulse: 84 94  Resp: 18   Temp: 97.7 F (36.5 C) 97.4 F (36.3 C)   Vitals:   03/26/16 1724 03/26/16 2046 03/27/16 0553 03/27/16 0946  BP: (!) 132/51 119/60 131/60 131/69  Pulse:  (!) 103 84 94  Resp:  18 18   Temp:  98.5 F (36.9 C) 97.7 F (36.5 C) 97.4 F (36.3 C)  TempSrc:  Oral Oral Oral  SpO2:  96% 100% 95%  Weight:      Height:        General: Pt is alert, awake, not in acute distress Cardiovascular: RRR, S1/S2 +, no rubs, no gallops Respiratory: CTA bilaterally, no wheezing, no  rhonchi Abdominal: Soft, NT, ND, bowel sounds + Extremities: no edema, no cyanosis Neuro: right leg 4/5, all other extremities 5/5, CN 2-12 intact    The results of significant diagnostics from this hospitalization (including imaging, microbiology, ancillary and laboratory) are listed below for reference.     Microbiology: No results found for this or any previous visit (from the past 240 hour(s)).   Labs: BNP (last 3 results) No results for input(s): BNP in the last 8760 hours. Basic Metabolic Panel:  Recent Labs Lab 03/25/16 2012 03/26/16 1207 03/27/16 0544  NA 139  142 140 141  K 3.3*  3.4* 4.4 3.8  CL 105  103 106 107  CO2 26 25 25   GLUCOSE 118*  114* 176* 171*  BUN 11  12 20  27*  CREATININE 1.08*  1.00 1.09* 1.08*  CALCIUM 9.9 9.9 9.7  MG  --  2.0  --    Liver Function Tests:  Recent Labs  Lab 03/25/16 2012  AST 25  ALT 19  ALKPHOS 153*  BILITOT 0.5  PROT 8.1  ALBUMIN 3.8   No results for input(s): LIPASE, AMYLASE in the last 168 hours. No results for input(s): AMMONIA in the last 168 hours. CBC:  Recent Labs Lab 03/25/16 2012  WBC 12.4*  NEUTROABS 9.1*  HGB 15.6*  14.6  HCT 46.0  45.1  MCV 90.4  PLT 405*   Cardiac Enzymes: No results for input(s): CKTOTAL, CKMB, CKMBINDEX, TROPONINI in the last 168 hours. BNP: Invalid input(s): POCBNP CBG:  Recent Labs Lab 03/25/16 1951  GLUCAP 115*   D-Dimer No results for input(s): DDIMER in the last 72 hours. Hgb A1c No results for input(s): HGBA1C in the last 72 hours. Lipid Profile No results for input(s): CHOL, HDL, LDLCALC, TRIG, CHOLHDL, LDLDIRECT in the last 72 hours. Thyroid function studies  Recent Labs  03/27/16 0544  TSH 0.476   Anemia work up No results for input(s): VITAMINB12, FOLATE, FERRITIN, TIBC, IRON, RETICCTPCT in the last 72 hours. Urinalysis    Component Value Date/Time   COLORURINE YELLOW 03/22/2014 1713   APPEARANCEUR CLOUDY (A) 03/22/2014 1713   LABSPEC  1.031 (H) 03/22/2014 1713   PHURINE 5.5 03/22/2014 1713   GLUCOSEU NEGATIVE 03/22/2014 1713   HGBUR SMALL (A) 03/22/2014 1713   BILIRUBINUR NEGATIVE 03/22/2014 1713   KETONESUR NEGATIVE 03/22/2014 1713   PROTEINUR 30 (A) 03/22/2014 1713   UROBILINOGEN 0.2 03/22/2014 1713   NITRITE NEGATIVE 03/22/2014 1713   LEUKOCYTESUR NEGATIVE 03/22/2014 1713   Sepsis Labs Invalid input(s): PROCALCITONIN,  WBC,  LACTICIDVEN Microbiology No results found for this or any previous visit (from the past 240 hour(s)).   Time coordinating discharge: Over 30 minutes  SIGNED:   Debbe Odea, MD  [facility] 03/27/2016, 5:33 PM Pager   If 7PM-7AM, please contact night-coverage [email or web address] Password [email or web address]

## 2016-03-28 ENCOUNTER — Telehealth: Payer: Self-pay | Admitting: Internal Medicine

## 2016-03-28 ENCOUNTER — Telehealth: Payer: Self-pay | Admitting: Radiation Oncology

## 2016-03-28 MED ORDER — FLUTICASONE FUROATE 100 MCG/ACT IN AEPB: 1.0000 | INHALATION_SPRAY | Freq: Every day | RESPIRATORY_TRACT | 3 refills | Status: AC

## 2016-03-28 NOTE — Telephone Encounter (Signed)
[facility] and spoke with pharmacist Joelene Millin - they unable to see any covered alternatives.  She asked that we send Rx and she can process this and fax the PA information to the office.  We already sent the Rx to [facility]  ??? ATC that pharmacy, no answer and no option to LM  Called spoke with patient and discussed the above.  Pt stated that [facility] closed and she transferred to [facility].  Pt does still have 4 days left on her current sample.  She will call the office on Monday 9.25.17 to coordinate retrieval of more samples.  Rx sent to [facility] so we can get started on PA.  Will keep in triage to follow

## 2016-03-28 NOTE — Telephone Encounter (Signed)
-----   Message from Pincus Large sent at 03/27/2016  3:15 PM EDT ----- Regarding: pt seen as an IP for consult. She has requested something to help her get through the MRI, SIM and SRS treatment.  [facility] in [place], [place] Address: [address], [place], [place] [address] Phone: [phone]  [name] mentioned calling something in for her during our visit.   Manuela Schwartz

## 2016-03-28 NOTE — Telephone Encounter (Signed)
Phoned in [place] per Shona Simpson PA-C order to [facility]. Then, phoned patient making her aware this was done. She verbalized understanding and expressed appreciation for the call.

## 2016-03-30 ENCOUNTER — Ambulatory Visit
Admit: 2016-03-30 | Discharge: 2016-03-30 | Disposition: A | Discharge status: Home / Self Care | Payer: Medicaid Other | Attending: Radiation Oncology | Admitting: Radiation Oncology

## 2016-03-30 DIAGNOSIS — C7931 Secondary malignant neoplasm of brain: Secondary | ICD-10-CM

## 2016-03-30 DIAGNOSIS — C7949 Secondary malignant neoplasm of other parts of nervous system: Principal | ICD-10-CM

## 2016-03-30 MED ORDER — GADOBENATE DIMEGLUMINE 529 MG/ML IV SOLN
17.0000 mL | Freq: Once | INTRAVENOUS | Status: AC | PRN
  Administered 2016-03-30: 17 mL via INTRAVENOUS

## 2016-03-31 ENCOUNTER — Ambulatory Visit
Admission: RE | Admit: 2016-03-31 | Discharge: 2016-03-31 | Disposition: A | Discharge status: Home / Self Care | Payer: Medicaid Other | Source: Ambulatory Visit | Attending: Radiation Oncology | Admitting: Radiation Oncology

## 2016-03-31 ENCOUNTER — Ambulatory Visit
Admit: 2016-03-31 | Discharge: 2016-03-31 | Disposition: A | Discharge status: Home / Self Care | Payer: Medicaid Other | Attending: Radiation Oncology | Admitting: Radiation Oncology

## 2016-03-31 VITALS — BP 149/79 | HR 90 | Resp 18 | Ht 67.0 in | Wt 189.7 lb

## 2016-03-31 VITALS — BP 149/79 | HR 90 | Resp 18

## 2016-03-31 DIAGNOSIS — I4891 Unspecified atrial fibrillation: Secondary | ICD-10-CM | POA: Diagnosis not present

## 2016-03-31 DIAGNOSIS — C7931 Secondary malignant neoplasm of brain: Secondary | ICD-10-CM | POA: Insufficient documentation

## 2016-03-31 DIAGNOSIS — R531 Weakness: Secondary | ICD-10-CM | POA: Diagnosis not present

## 2016-03-31 DIAGNOSIS — Z7901 Long term (current) use of anticoagulants: Secondary | ICD-10-CM | POA: Insufficient documentation

## 2016-03-31 DIAGNOSIS — R410 Disorientation, unspecified: Secondary | ICD-10-CM | POA: Diagnosis not present

## 2016-03-31 DIAGNOSIS — C649 Malignant neoplasm of unspecified kidney, except renal pelvis: Secondary | ICD-10-CM | POA: Insufficient documentation

## 2016-03-31 DIAGNOSIS — Z510 Encounter for antineoplastic radiation therapy: Secondary | ICD-10-CM | POA: Insufficient documentation

## 2016-03-31 MED ORDER — BECLOMETHASONE DIPROPIONATE 80 MCG/ACT IN AERS: 2.0000 | INHALATION_SPRAY | Freq: Two times a day (BID) | RESPIRATORY_TRACT | 0 refills | Status: DC

## 2016-03-31 MED ORDER — SODIUM CHLORIDE 0.9% FLUSH
10.0000 mL | INTRAVENOUS | Status: AC
  Administered 2016-03-31: 10 mL via INTRAVENOUS

## 2016-03-31 NOTE — Telephone Encounter (Signed)
PATIENT's SON IS IN LOBBY, Porshea Koffel, to pick up samples for the patient.

## 2016-03-31 NOTE — Progress Notes (Signed)
Does patient have an allergy to IV contrast dye?:no   Has patient ever received premedication for IV contrast dye?: No.   Does patient take metformin?: No.  If patient does take metformin when was the last dose: N/A  Date of lab work: 03/27/16 BUN: 27 CR: 1.08 OK to start IV per Shona Simpson, PA-C despite elevate lab values  IV site: antecubital right, condition no redness  BP (!) 149/79 (BP Location: Left Arm, Patient Position: Sitting, Cuff Size: Normal)   Pulse 90   Resp 18   SpO2 100%  Wt Readings from Last 3 Encounters:  03/31/16 189 lb 11.2 oz (86 kg)  03/26/16 189 lb 13.1 oz (86.1 kg)  03/07/16 191 lb (86.6 kg)

## 2016-03-31 NOTE — Progress Notes (Signed)
  Radiation Oncology         [phone]) [phone] ________________________________  Name: Allison [name] MRN: [id number]  Date: 03/31/2016  DOB: June 18, 1951  SIMULATION AND TREATMENT PLANNING NOTE    ICD-9-CM ICD-10-CM   1. Brain metastases ([place]) 198.3 C79.31     DIAGNOSIS: 65 y.o. woman with metastatic renal cell carcinoma to the brain including a left frontal 14 mm lesion and a right frontal 10 mm lesion  NARRATIVE:  The patient was brought to the [facility].  Identity was confirmed.  All relevant records and images related to the planned course of therapy were reviewed.  The patient freely provided informed written consent to proceed with treatment after reviewing the details related to the planned course of therapy. The consent form was witnessed and verified by the simulation staff. Intravenous access was established for contrast administration. Then, the patient was set-up in a stable reproducible supine position for radiation therapy.  A relocatable thermoplastic stereotactic head frame was fabricated for precise immobilization.  CT images were obtained.  Surface markings were placed.  The CT images were loaded into the planning software and fused with the patient's targeting MRI scan.  Then the target and avoidance structures were contoured.  Treatment planning then occurred.  The radiation prescription was entered and confirmed.  I have requested 3D planning  I have requested a DVH of the following structures: Brain stem, brain, left eye, right eye, lenses, optic chiasm, target volumes, uninvolved brain, and normal tissue.    SPECIAL TREATMENT PROCEDURE:  The planned course of therapy using radiation constitutes a special treatment procedure. Special care is required in the management of this patient for the following reasons. This treatment constitutes a Special Treatment Procedure for the following reason: High dose per fraction requiring special monitoring for increased toxicities  of treatment including daily imaging.  The special nature of the planned course of radiotherapy will require increased physician supervision and oversight to ensure patient's safety with optimal treatment outcomes.  PLAN:  The patient will receive 20 Gy in 1 fraction to both of the two brain mets.  ________________________________  Sheral Apley Tammi Klippel, M.D.  This document serves as a record of services personally performed by Tyler Pita, MD. It was created on his behalf by Darcus Austin, a trained medical scribe. The creation of this record is based on the scribe's personal observations and the provider's statements to them. This document has been checked and approved by the attending provider.

## 2016-03-31 NOTE — Telephone Encounter (Signed)
Spoke with pt's son, Mitzi Hansen. Advised him that we do not have samples at this time. Will keep message open to follow up on PA.

## 2016-03-31 NOTE — Telephone Encounter (Signed)
Patient called states that she would like samples something similar to the arnuity or stiolto. She states she is sending her husband up here to wait for samples - pr

## 2016-03-31 NOTE — Telephone Encounter (Signed)
Called [facility] to initiate PA > called [phone]  ID# [id number] T Spoke with rep Jani Gravel is being sent to pharmacist for clinical review > okay call after 24hrs to check status Confirmation # [id number]  Discussed with with BQ since he is doc of the day and pt's spouse is in the lobby Per BQ > okay for QVAR 80 2puffs BID until we can get the Arnuity covered  Spoke with patient's spouse and discussed the above.  Spouse voiced his understanding and is familiar with the process of taking QVAR and was comfortable relaying HFA teaching to patient.  Instructions also written on the sample box.  Sample documented per protocol Will forward back to triage for follow up on the PA

## 2016-04-01 ENCOUNTER — Telehealth: Payer: Self-pay | Admitting: Cardiovascular Disease

## 2016-04-01 NOTE — Telephone Encounter (Signed)
Called [place] [facility] to check on PA. Spoke with Fara Olden. This was approved from 03/31/16-03/21/18. Called pharmacy and made aware. Called pt and she is aware as well.

## 2016-04-01 NOTE — Telephone Encounter (Signed)
New Message   *STAT* If patient is at the pharmacy, call can be transferred to refill team.   1. Which medications need to be refilled? (please list name of each medication and dose if known) Cardizem 120mg   2. Which pharmacy/location (including street and city if local pharmacy) is medication to be sent to? [facility]  3. Do they need a 30 day or 90 day supply? Per pt does not know it was given to her at the hospital..  Please call back to discuss

## 2016-04-02 DIAGNOSIS — Z510 Encounter for antineoplastic radiation therapy: Secondary | ICD-10-CM | POA: Diagnosis not present

## 2016-04-02 MED ORDER — DILTIAZEM HCL ER COATED BEADS 120 MG PO CP24: 120.0000 mg | ORAL_CAPSULE | Freq: Every day | ORAL | 10 refills | Status: DC

## 2016-04-02 NOTE — Telephone Encounter (Signed)
Pt was prescribed cardizem 120 mg in the hospital on 03/27/16, pt is requesting a refill. Would you like to refill this medication? Please advise

## 2016-04-02 NOTE — Telephone Encounter (Signed)
Called pt to inform her that her Rx was sent to her pharmacy as requested. Confirmation received.

## 2016-04-02 NOTE — Telephone Encounter (Signed)
OK to refill

## 2016-04-03 ENCOUNTER — Encounter: Payer: Self-pay | Admitting: Radiation Oncology

## 2016-04-03 ENCOUNTER — Ambulatory Visit
Admission: RE | Admit: 2016-04-03 | Discharge: 2016-04-03 | Disposition: A | Discharge status: Home / Self Care | Payer: Medicaid Other | Source: Ambulatory Visit | Attending: Radiation Oncology | Admitting: Radiation Oncology

## 2016-04-03 VITALS — BP 144/90 | HR 86 | Temp 98.0°F | Resp 20

## 2016-04-03 DIAGNOSIS — Z510 Encounter for antineoplastic radiation therapy: Secondary | ICD-10-CM | POA: Diagnosis not present

## 2016-04-03 DIAGNOSIS — C7931 Secondary malignant neoplasm of brain: Secondary | ICD-10-CM

## 2016-04-03 NOTE — Op Note (Signed)
  Name: Allison [name]  MRN: [id number]  Date: 04/03/2016   DOB: [birth year]  Stereotactic Radiosurgery Operative Note  PRE-OPERATIVE DIAGNOSIS:  Multiple Brain Metastases  POST-OPERATIVE DIAGNOSIS:  Multiple Brain Metastases  PROCEDURE:  Stereotactic Radiosurgery  SURGEON:  Charlie Pitter, MD  NARRATIVE: The patient underwent a radiation treatment planning session in the radiation oncology simulation suite under the care of the radiation oncology physician and physicist.  I participated closely in the radiation treatment planning afterwards. The patient underwent planning CT which was fused to 3T high resolution MRI with 1 mm axial slices.  These images were fused on the planning system.  We contoured the gross target volumes and subsequently expanded this to yield the Planning Target Volume. I actively participated in the planning process.  I helped to define and review the target contours and also the contours of the optic pathway, eyes, brainstem and selected nearby organs at risk.  All the dose constraints for critical structures were reviewed and compared to AAPM Task Group 101.  The prescription dose conformity was reviewed.  I approved the plan electronically.    Accordingly, [name] was brought to the TrueBeam stereotactic radiation treatment linac and placed in the custom immobilization mask.  The patient was aligned according to the IR fiducial markers with BrainLab Exactrac, then orthogonal x-rays were used in ExacTrac with the 6DOF robotic table and the shifts were made to align the patient  [name] received stereotactic radiosurgery uneventfully.    Lesions treated:  2   Complex lesions treated:  0 (>3.5 cm, <59mm of optic path, or within the brainstem)   The detailed description of the procedure is recorded in the radiation oncology procedure note.  I was present for the duration of the procedure.  DISPOSITION:  Following delivery, the patient was transported to nursing in  stable condition and monitored for possible acute effects to be discharged to home in stable condition with follow-up in one month.  Jayden Rudge A, MD 04/03/2016 1:16 PM

## 2016-04-03 NOTE — Progress Notes (Addendum)
Patient brought to nursing s/p SRS Brain, patient denies any pain, headache, nausea, vision changes,no blurred vision, only unsteady is her c/o, , patient in w/c, offered coffee, will monitor patient for 30 minutes,  Spouse in with patient , decadron to be discussed no driving today by patient, patient gave verbal understanding 1:33 PM

## 2016-04-03 NOTE — Progress Notes (Signed)
  Radiation Oncology         [phone] ________________________________  Stereotactic Treatment Procedure Note  Name: Allison [name] MRN: [id number]  Date: 04/03/2016  DOB: 1951-06-30  SPECIAL TREATMENT PROCEDURE  No diagnosis found.  3D TREATMENT PLANNING AND DOSIMETRY:  The patient's radiation plan was reviewed and approved by neurosurgery and radiation oncology prior to treatment.  It showed 3-dimensional radiation distributions overlaid onto the planning CT/MRI image set.  The [facility] for the target structures as well as the organs at risk were reviewed. The documentation of the 3D plan and dosimetry are filed in the radiation oncology EMR.  NARRATIVE:  Allison [name] was brought to the TrueBeam stereotactic radiation treatment machine and placed supine on the CT couch. The head frame was applied, and the patient was set up for stereotactic radiosurgery.  Neurosurgery was present for the set-up and delivery  SIMULATION VERIFICATION:  In the couch zero-angle position, the patient underwent Exactrac imaging using the Brainlab system with orthogonal KV images.  These were carefully aligned and repeated to confirm treatment position for each of the isocenters.  The Exactrac snap film verification was repeated at each couch angle.  PROCEDURE: Allison [name] received stereotactic radiosurgery to the following targets: Left frontal 14 mm target was treated using 2 Dynamic Conformal Arcs to a prescription dose of 20 Gy.  ExacTrac registration was performed for each couch angle.  The 79.7% isodose line was prescribed.  6 MV X-rays were delivered in the flattening filter free beam mode. Right frontal 10 mm target was treated using 2 Dynamic Conformal Arcs to a prescription dose of 20 Gy.  ExacTrac registration was performed for each couch angle.  The 80% isodose line was prescribed.  6 MV X-rays were delivered in the flattening filter free beam mode.  STEREOTACTIC TREATMENT MANAGEMENT:  Following delivery,  the patient was transported to nursing in stable condition and monitored for possible acute effects.  Vital signs were recorded BP (!) 144/90 (BP Location: Left Arm, Patient Position: Sitting, Cuff Size: Normal)   Pulse 86   Temp 98 F (36.7 C) (Oral)   Resp 20   SpO2 95% Comment: room air. The patient tolerated treatment without significant acute effects, and was discharged to home in stable condition.    PLAN: Follow-up in one month.  ________________________________   Jodelle Gross, MD, PhD    This document serves as a record of services personally performed by Tyler Pita, MD. It was created on his behalf by Bethann Humble, a trained medical scribe. The creation of this record is based on the scribe's personal observations and the provider's statements to them. This document has been checked and approved by the attending provider.

## 2016-04-03 NOTE — Progress Notes (Signed)
Dexamethasone (DECADRON) 4 mg tablet   Start the taper below on Monday, October 2nd Take 4 mg 3 times daily for 7 days,  Then 4 mg 2 times daily     Call [name], RN at [phone] with any regression.

## 2016-04-04 ENCOUNTER — Telehealth: Payer: Self-pay | Admitting: Radiation Oncology

## 2016-04-04 ENCOUNTER — Ambulatory Visit: Payer: Medicaid Other | Admitting: Cardiology

## 2016-04-04 ENCOUNTER — Other Ambulatory Visit: Payer: Self-pay | Admitting: Radiation Oncology

## 2016-04-04 DIAGNOSIS — C7800 Secondary malignant neoplasm of unspecified lung: Principal | ICD-10-CM

## 2016-04-04 DIAGNOSIS — C649 Malignant neoplasm of unspecified kidney, except renal pelvis: Secondary | ICD-10-CM

## 2016-04-04 DIAGNOSIS — C7931 Secondary malignant neoplasm of brain: Secondary | ICD-10-CM

## 2016-04-04 MED ORDER — DEXAMETHASONE 4 MG PO TABS: 4.0000 mg | ORAL_TABLET | Freq: Four times a day (QID) | ORAL | 0 refills | Status: DC

## 2016-04-04 MED ORDER — DEXAMETHASONE 4 MG PO TABS: 4.0000 mg | ORAL_TABLET | Freq: Three times a day (TID) | ORAL | 1 refills | Status: DC

## 2016-04-04 NOTE — Telephone Encounter (Signed)
-----   Message from Pincus Large sent at 04/04/2016 11:36 AM EDT ----- Regarding: pt called requesting steroid RX be called into her drug store  [name] left me a message requesting that her steroid Rx be called into her pharmacy because she is "getting low". She was treated yesterday.   [place] Phone: [phone]  Thanks, Manuela Schwartz

## 2016-04-04 NOTE — Telephone Encounter (Signed)
Per Shona Simpson, PA-C order called in decadron refill to [facility], qty 120, and no refills. Then, phoned patient making her aware this was done. Patient verbalized understanding and expressed appreciation for the call.

## 2016-04-05 NOTE — Progress Notes (Signed)
Late entry from 04/03/16 at 1400. Nurse monitoring following SRS treatment complete. Patient without complaints. Denies headache, dizziness, nausea, diplopia or ringing in the ears. Decadron taper instructions reviewed with patient. Patient understands to avoid strenuous activity for the next 24 hours and call [phone] with needs. Patient exited with husband pushing wheelchair. No distress noted.

## 2016-04-05 NOTE — Addendum Note (Signed)
Encounter addended by: Heywood Footman, RN on: 04/05/2016 10:15 AM<BR>    Actions taken: Sign clinical note

## 2016-04-06 NOTE — Addendum Note (Signed)
Encounter addended by: Kyung Rudd, MD on: 04/06/2016 10:31 PM<BR>    Actions taken: Visit diagnoses modified

## 2016-04-10 NOTE — Progress Notes (Signed)
  Radiation Oncology         [phone] ________________________________  Name: Allison [name] MRN: [id number]  Date: 04/03/2016  DOB: 08/14/1950  End of Treatment Note  Diagnosis:   65 y.o. 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 PTV1 Left frontal 13 mm was treated to 20 Gy in 1 fraction. 2. PTV2 Right frontal 10 mm was treated to 20 Gy in 1 fraction.  Beams/energy:    1. SBRT/SRT-3D // 6FFF 2. SBRT/SRT-3D // 6FFF  Narrative: The patient tolerated radiation treatment relatively well.  The patient had no concerns or complaints with treatment.    Plan: The patient has completed radiation treatment. The patient will return to radiation oncology clinic for routine followup in one month. I advised her to call or return sooner if she has any questions or concerns related to her recovery or treatment. ________________________________  Sheral Apley. Tammi Klippel, M.D.  This document serves as a record of services personally performed by Tyler Pita, MD. It was created on his behalf by Arlyce Harman, a trained medical scribe. The creation of this record is based on the scribe's personal observations and the provider's statements to them. This document has been checked and approved by the attending provider.

## 2016-04-20 ENCOUNTER — Encounter: Payer: Self-pay | Admitting: Radiation Oncology

## 2016-04-21 ENCOUNTER — Telehealth: Payer: Self-pay | Admitting: Radiation Therapy

## 2016-04-21 ENCOUNTER — Telehealth: Payer: Self-pay | Admitting: Medical Oncology

## 2016-04-21 NOTE — Telephone Encounter (Signed)
Received a call from [place] at 5:15pm stating that she has had the following symptoms x 1 week and they are "just getting worse".   Extreme weakness - unable to get out of bed without help Dizzy white patches with sore throat suggestive for thrush  Her current steroid does is 4 mg bid. She has been on this dose since 10/9.  I told her  that I will let Dr. Tammi Klippel, his PA [name], and his nurse [name] know about these issues and get back with her ASAP.     Pharmacy: [facility], [place], [place].  Phone: [phone]  Mont Dutton R.T.(R)(T) Special Procedures Navigator

## 2016-04-21 NOTE — Telephone Encounter (Signed)
I called pt back and she was crying that she cannot get up or move at all. She is taking decadron 4 mg bid since 10/9.'I am getting worse". I recommended she go to ED and she said " I am not going to the ED". Message left in xrt . I called pt back and told her I left message in radiation and again suggested calling 911 to take her to the ED. She said " I think I will go to ED. I told her to call 911.

## 2016-04-21 NOTE — Telephone Encounter (Signed)
I called [place] and gave pt address and relative hone numbers

## 2016-04-22 ENCOUNTER — Telehealth: Payer: Self-pay | Admitting: Radiation Oncology

## 2016-04-22 ENCOUNTER — Other Ambulatory Visit: Payer: Self-pay

## 2016-04-22 ENCOUNTER — Encounter (HOSPITAL_COMMUNITY): Payer: Self-pay | Admitting: Emergency Medicine

## 2016-04-22 ENCOUNTER — Emergency Department (HOSPITAL_COMMUNITY): Payer: Medicaid Other

## 2016-04-22 ENCOUNTER — Encounter: Payer: Self-pay | Admitting: Radiation Oncology

## 2016-04-22 ENCOUNTER — Ambulatory Visit
Admission: RE | Admit: 2016-04-22 | Discharge: 2016-04-22 | Disposition: A | Discharge status: Home / Self Care | Payer: Medicaid Other | Source: Ambulatory Visit | Attending: Radiation Oncology | Admitting: Radiation Oncology

## 2016-04-22 ENCOUNTER — Inpatient Hospital Stay (HOSPITAL_COMMUNITY)
Admission: EM | Admit: 2016-04-22 | Discharge: 2016-04-24 | DRG: 309 | Disposition: A | Discharge status: Skilled Nursing Facility | Payer: Medicaid Other | Attending: Family Medicine | Admitting: Family Medicine

## 2016-04-22 DIAGNOSIS — R410 Disorientation, unspecified: Secondary | ICD-10-CM | POA: Diagnosis not present

## 2016-04-22 DIAGNOSIS — Z923 Personal history of irradiation: Secondary | ICD-10-CM

## 2016-04-22 DIAGNOSIS — Z7901 Long term (current) use of anticoagulants: Secondary | ICD-10-CM | POA: Diagnosis not present

## 2016-04-22 DIAGNOSIS — J9611 Chronic respiratory failure with hypoxia: Secondary | ICD-10-CM

## 2016-04-22 DIAGNOSIS — E860 Dehydration: Secondary | ICD-10-CM | POA: Diagnosis present

## 2016-04-22 DIAGNOSIS — E785 Hyperlipidemia, unspecified: Secondary | ICD-10-CM | POA: Diagnosis present

## 2016-04-22 DIAGNOSIS — R29898 Other symptoms and signs involving the musculoskeletal system: Secondary | ICD-10-CM | POA: Insufficient documentation

## 2016-04-22 DIAGNOSIS — J9610 Chronic respiratory failure, unspecified whether with hypoxia or hypercapnia: Secondary | ICD-10-CM | POA: Diagnosis present

## 2016-04-22 DIAGNOSIS — Z7952 Long term (current) use of systemic steroids: Secondary | ICD-10-CM

## 2016-04-22 DIAGNOSIS — N183 Chronic kidney disease, stage 3 unspecified: Secondary | ICD-10-CM | POA: Diagnosis present

## 2016-04-22 DIAGNOSIS — Z7951 Long term (current) use of inhaled steroids: Secondary | ICD-10-CM | POA: Diagnosis not present

## 2016-04-22 DIAGNOSIS — I252 Old myocardial infarction: Secondary | ICD-10-CM

## 2016-04-22 DIAGNOSIS — F4320 Adjustment disorder, unspecified: Secondary | ICD-10-CM | POA: Diagnosis present

## 2016-04-22 DIAGNOSIS — D496 Neoplasm of unspecified behavior of brain: Secondary | ICD-10-CM | POA: Diagnosis not present

## 2016-04-22 DIAGNOSIS — I25119 Atherosclerotic heart disease of native coronary artery with unspecified angina pectoris: Secondary | ICD-10-CM | POA: Diagnosis present

## 2016-04-22 DIAGNOSIS — C7802 Secondary malignant neoplasm of left lung: Secondary | ICD-10-CM | POA: Diagnosis present

## 2016-04-22 DIAGNOSIS — T380X5A Adverse effect of glucocorticoids and synthetic analogues, initial encounter: Secondary | ICD-10-CM | POA: Diagnosis present

## 2016-04-22 DIAGNOSIS — Z9861 Coronary angioplasty status: Secondary | ICD-10-CM

## 2016-04-22 DIAGNOSIS — Z87891 Personal history of nicotine dependence: Secondary | ICD-10-CM

## 2016-04-22 DIAGNOSIS — I129 Hypertensive chronic kidney disease with stage 1 through stage 4 chronic kidney disease, or unspecified chronic kidney disease: Secondary | ICD-10-CM | POA: Diagnosis present

## 2016-04-22 DIAGNOSIS — C649 Malignant neoplasm of unspecified kidney, except renal pelvis: Secondary | ICD-10-CM | POA: Diagnosis present

## 2016-04-22 DIAGNOSIS — I255 Ischemic cardiomyopathy: Secondary | ICD-10-CM | POA: Diagnosis present

## 2016-04-22 DIAGNOSIS — Z8249 Family history of ischemic heart disease and other diseases of the circulatory system: Secondary | ICD-10-CM | POA: Diagnosis not present

## 2016-04-22 DIAGNOSIS — Z87442 Personal history of urinary calculi: Secondary | ICD-10-CM | POA: Diagnosis not present

## 2016-04-22 DIAGNOSIS — I739 Peripheral vascular disease, unspecified: Secondary | ICD-10-CM | POA: Diagnosis present

## 2016-04-22 DIAGNOSIS — R531 Weakness: Secondary | ICD-10-CM | POA: Diagnosis not present

## 2016-04-22 DIAGNOSIS — E1165 Type 2 diabetes mellitus with hyperglycemia: Secondary | ICD-10-CM | POA: Diagnosis present

## 2016-04-22 DIAGNOSIS — M21371 Foot drop, right foot: Secondary | ICD-10-CM | POA: Diagnosis present

## 2016-04-22 DIAGNOSIS — Z510 Encounter for antineoplastic radiation therapy: Secondary | ICD-10-CM | POA: Diagnosis present

## 2016-04-22 DIAGNOSIS — C7801 Secondary malignant neoplasm of right lung: Secondary | ICD-10-CM | POA: Diagnosis present

## 2016-04-22 DIAGNOSIS — J449 Chronic obstructive pulmonary disease, unspecified: Secondary | ICD-10-CM | POA: Diagnosis present

## 2016-04-22 DIAGNOSIS — Z905 Acquired absence of kidney: Secondary | ICD-10-CM

## 2016-04-22 DIAGNOSIS — F40240 Claustrophobia: Secondary | ICD-10-CM | POA: Diagnosis present

## 2016-04-22 DIAGNOSIS — C7931 Secondary malignant neoplasm of brain: Secondary | ICD-10-CM | POA: Diagnosis present

## 2016-04-22 DIAGNOSIS — I4891 Unspecified atrial fibrillation: Principal | ICD-10-CM | POA: Diagnosis present

## 2016-04-22 DIAGNOSIS — Z9981 Dependence on supplemental oxygen: Secondary | ICD-10-CM | POA: Diagnosis not present

## 2016-04-22 DIAGNOSIS — E099 Drug or chemical induced diabetes mellitus without complications: Secondary | ICD-10-CM

## 2016-04-22 DIAGNOSIS — B379 Candidiasis, unspecified: Secondary | ICD-10-CM | POA: Diagnosis present

## 2016-04-22 DIAGNOSIS — R5381 Other malaise: Secondary | ICD-10-CM

## 2016-04-22 DIAGNOSIS — I10 Essential (primary) hypertension: Secondary | ICD-10-CM | POA: Diagnosis present

## 2016-04-22 DIAGNOSIS — Z823 Family history of stroke: Secondary | ICD-10-CM

## 2016-04-22 DIAGNOSIS — C7800 Secondary malignant neoplasm of unspecified lung: Secondary | ICD-10-CM

## 2016-04-22 HISTORY — DX: Drug or chemical induced diabetes mellitus without complications: E09.9

## 2016-04-22 HISTORY — DX: Adverse effect of glucocorticoids and synthetic analogues, initial encounter: T38.0X5A

## 2016-04-22 LAB — CBC WITH DIFFERENTIAL/PLATELET
BASOS PCT: 0 %
Basophils Absolute: 0 10*3/uL (ref 0.0–0.1)
EOS PCT: 0 %
Eosinophils Absolute: 0 10*3/uL (ref 0.0–0.7)
HCT: 39.5 % (ref 36.0–46.0)
HEMOGLOBIN: 13.5 g/dL (ref 12.0–15.0)
LYMPHS ABS: 0.3 10*3/uL — AB (ref 0.7–4.0)
Lymphocytes Relative: 4 %
MCH: 29.7 pg (ref 26.0–34.0)
MCHC: 34.2 g/dL (ref 30.0–36.0)
MCV: 87 fL (ref 78.0–100.0)
Monocytes Absolute: 0.5 10*3/uL (ref 0.1–1.0)
Monocytes Relative: 5 %
NEUTROS PCT: 91 %
Neutro Abs: 8.7 10*3/uL — ABNORMAL HIGH (ref 1.7–7.7)
PLATELETS: 152 10*3/uL (ref 150–400)
RBC: 4.54 MIL/uL (ref 3.87–5.11)
RDW: 15.6 % — ABNORMAL HIGH (ref 11.5–15.5)
WBC: 9.5 10*3/uL (ref 4.0–10.5)

## 2016-04-22 LAB — PROTIME-INR
INR: 1.45
Prothrombin Time: 17.8 seconds — ABNORMAL HIGH (ref 11.4–15.2)

## 2016-04-22 LAB — URINALYSIS, ROUTINE W REFLEX MICROSCOPIC
Bilirubin Urine: NEGATIVE
Glucose, UA: >1000 mg/dL — AB
Hgb urine dipstick: NEGATIVE
Ketones, ur: 15 mg/dL — AB
LEUKOCYTES UA: NEGATIVE
Nitrite: NEGATIVE
PROTEIN: 30 mg/dL — AB
SPECIFIC GRAVITY, URINE: 1.04 — AB (ref 1.005–1.030)
pH: 6.5 (ref 5.0–8.0)

## 2016-04-22 LAB — COMPREHENSIVE METABOLIC PANEL
ALBUMIN: 3.2 g/dL — AB (ref 3.5–5.0)
ALT: 71 U/L — AB (ref 14–54)
ANION GAP: 12 (ref 5–15)
AST: 21 U/L (ref 15–41)
Alkaline Phosphatase: 102 U/L (ref 38–126)
BUN: 28 mg/dL — ABNORMAL HIGH (ref 6–20)
CHLORIDE: 96 mmol/L — AB (ref 101–111)
CO2: 25 mmol/L (ref 22–32)
CREATININE: 0.86 mg/dL (ref 0.44–1.00)
Calcium: 8.9 mg/dL (ref 8.9–10.3)
GFR calc non Af Amer: >60 mL/min (ref >60)
GLUCOSE: 466 mg/dL — AB (ref 65–99)
Potassium: 4 mmol/L (ref 3.5–5.1)
SODIUM: 133 mmol/L — AB (ref 135–145)
Total Bilirubin: 1 mg/dL (ref 0.3–1.2)
Total Protein: 6.3 g/dL — ABNORMAL LOW (ref 6.5–8.1)

## 2016-04-22 LAB — CBG MONITORING, ED
GLUCOSE-CAPILLARY: 409 mg/dL — AB (ref 65–99)
Glucose-Capillary: 389 mg/dL — ABNORMAL HIGH (ref 65–99)

## 2016-04-22 LAB — MAGNESIUM: MAGNESIUM: 2.2 mg/dL (ref 1.7–2.4)

## 2016-04-22 LAB — URINE MICROSCOPIC-ADD ON
Bacteria, UA: NONE SEEN
RBC / HPF: NONE SEEN RBC/hpf (ref 0–5)
SQUAMOUS EPITHELIAL / LPF: NONE SEEN

## 2016-04-22 LAB — GLUCOSE, CAPILLARY: GLUCOSE-CAPILLARY: 321 mg/dL — AB (ref 65–99)

## 2016-04-22 LAB — PHOSPHORUS: PHOSPHORUS: 3.1 mg/dL (ref 2.5–4.6)

## 2016-04-22 MED ORDER — INSULIN ASPART 100 UNIT/ML ~~LOC~~ SOLN
6.0000 [IU] | Freq: Three times a day (TID) | SUBCUTANEOUS | Status: DC
  Administered 2016-04-23 (×2): 6 [IU] via SUBCUTANEOUS

## 2016-04-22 MED ORDER — DILTIAZEM HCL 25 MG/5ML IV SOLN
10.0000 mg | Freq: Once | INTRAVENOUS | Status: AC
  Administered 2016-04-22: 10 mg via INTRAVENOUS
  Filled 2016-04-22: qty 5

## 2016-04-22 MED ORDER — ONDANSETRON HCL 4 MG/2ML IJ SOLN: 4.0000 mg | Freq: Four times a day (QID) | INTRAMUSCULAR | Status: DC | PRN

## 2016-04-22 MED ORDER — LORAZEPAM 2 MG/ML IJ SOLN
1.0000 mg | INTRAMUSCULAR | Status: DC | PRN
  Administered 2016-04-22: 1 mg via INTRAVENOUS
  Filled 2016-04-22: qty 1

## 2016-04-22 MED ORDER — INSULIN ASPART 100 UNIT/ML ~~LOC~~ SOLN
15.0000 [IU] | Freq: Once | SUBCUTANEOUS | Status: AC
  Administered 2016-04-22: 15 [IU] via SUBCUTANEOUS
  Filled 2016-04-22: qty 1

## 2016-04-22 MED ORDER — SENNOSIDES-DOCUSATE SODIUM 8.6-50 MG PO TABS: 1.0000 | ORAL_TABLET | Freq: Every evening | ORAL | Status: DC | PRN

## 2016-04-22 MED ORDER — DEXAMETHASONE SODIUM PHOSPHATE 10 MG/ML IJ SOLN
10.0000 mg | Freq: Once | INTRAMUSCULAR | Status: AC
  Administered 2016-04-22: 10 mg via INTRAVENOUS
  Filled 2016-04-22: qty 1

## 2016-04-22 MED ORDER — SODIUM CHLORIDE 0.9% FLUSH
3.0000 mL | Freq: Two times a day (BID) | INTRAVENOUS | Status: DC
  Administered 2016-04-23: 3 mL via INTRAVENOUS

## 2016-04-22 MED ORDER — INSULIN ASPART 100 UNIT/ML ~~LOC~~ SOLN
0.0000 [IU] | Freq: Every day | SUBCUTANEOUS | Status: DC
  Administered 2016-04-23: 1 [IU] via SUBCUTANEOUS
  Administered 2016-04-23: 3 [IU] via SUBCUTANEOUS

## 2016-04-22 MED ORDER — POTASSIUM CHLORIDE CRYS ER 10 MEQ PO TBCR
10.0000 meq | EXTENDED_RELEASE_TABLET | Freq: Every day | ORAL | Status: DC
  Administered 2016-04-24: 10 meq via ORAL
  Filled 2016-04-22: qty 1

## 2016-04-22 MED ORDER — DIPHENHYDRAMINE-APAP (SLEEP) 25-500 MG PO TABS: 2.0000 | ORAL_TABLET | Freq: Every day | ORAL | Status: DC | PRN

## 2016-04-22 MED ORDER — FLUTICASONE FUROATE 100 MCG/ACT IN AEPB: 1.0000 | INHALATION_SPRAY | RESPIRATORY_TRACT | Status: DC

## 2016-04-22 MED ORDER — ESCITALOPRAM OXALATE 10 MG PO TABS
20.0000 mg | ORAL_TABLET | Freq: Every day | ORAL | Status: DC
  Administered 2016-04-22 – 2016-04-23 (×2): 20 mg via ORAL
  Filled 2016-04-22 (×2): qty 2

## 2016-04-22 MED ORDER — SIMVASTATIN 20 MG PO TABS: 20.0000 mg | ORAL_TABLET | Freq: Every day | ORAL | Status: DC

## 2016-04-22 MED ORDER — FUROSEMIDE 40 MG PO TABS: 20.0000 mg | ORAL_TABLET | Freq: Every morning | ORAL | Status: DC

## 2016-04-22 MED ORDER — POTASSIUM CHLORIDE IN NACL 20-0.9 MEQ/L-% IV SOLN
INTRAVENOUS | Status: DC
  Administered 2016-04-22 – 2016-04-23 (×2): via INTRAVENOUS
  Filled 2016-04-22 (×3): qty 1000

## 2016-04-22 MED ORDER — ACETAMINOPHEN 650 MG RE SUPP: 650.0000 mg | Freq: Four times a day (QID) | RECTAL | Status: DC | PRN

## 2016-04-22 MED ORDER — FUROSEMIDE 40 MG PO TABS
20.0000 mg | ORAL_TABLET | Freq: Every morning | ORAL | Status: DC
  Administered 2016-04-24: 20 mg via ORAL
  Filled 2016-04-22: qty 1

## 2016-04-22 MED ORDER — UMECLIDINIUM BROMIDE 62.5 MCG/INH IN AEPB
1.0000 | INHALATION_SPRAY | Freq: Every day | RESPIRATORY_TRACT | Status: DC
  Administered 2016-04-23 – 2016-04-24 (×2): 1 via RESPIRATORY_TRACT
  Filled 2016-04-22: qty 7

## 2016-04-22 MED ORDER — POTASSIUM CHLORIDE IN NACL 20-0.9 MEQ/L-% IV SOLN: INTRAVENOUS | Status: DC

## 2016-04-22 MED ORDER — METOPROLOL TARTRATE 25 MG PO TABS
100.0000 mg | ORAL_TABLET | Freq: Every day | ORAL | Status: DC
  Administered 2016-04-23 – 2016-04-24 (×2): 100 mg via ORAL
  Filled 2016-04-22 (×2): qty 4

## 2016-04-22 MED ORDER — APIXABAN 5 MG PO TABS
5.0000 mg | ORAL_TABLET | Freq: Two times a day (BID) | ORAL | Status: DC
  Administered 2016-04-22 – 2016-04-24 (×4): 5 mg via ORAL
  Filled 2016-04-22 (×4): qty 1

## 2016-04-22 MED ORDER — DEXAMETHASONE 4 MG PO TABS
4.0000 mg | ORAL_TABLET | Freq: Three times a day (TID) | ORAL | Status: DC
  Administered 2016-04-23 – 2016-04-24 (×5): 4 mg via ORAL
  Filled 2016-04-22 (×6): qty 1

## 2016-04-22 MED ORDER — METOPROLOL TARTRATE 25 MG PO TABS
75.0000 mg | ORAL_TABLET | Freq: Every day | ORAL | Status: DC
  Administered 2016-04-22: 75 mg via ORAL
  Filled 2016-04-22: qty 3

## 2016-04-22 MED ORDER — ACETAMINOPHEN 325 MG PO TABS
650.0000 mg | ORAL_TABLET | Freq: Four times a day (QID) | ORAL | Status: DC | PRN
  Administered 2016-04-24: 650 mg via ORAL
  Filled 2016-04-22: qty 2

## 2016-04-22 MED ORDER — ALBUTEROL SULFATE (2.5 MG/3ML) 0.083% IN NEBU: 2.5000 mg | INHALATION_SOLUTION | RESPIRATORY_TRACT | Status: DC | PRN

## 2016-04-22 MED ORDER — DILTIAZEM HCL ER COATED BEADS 120 MG PO CP24
120.0000 mg | ORAL_CAPSULE | Freq: Every day | ORAL | Status: DC
  Administered 2016-04-23: 120 mg via ORAL
  Filled 2016-04-22: qty 1

## 2016-04-22 MED ORDER — DIAZEPAM 5 MG/ML IJ SOLN
5.0000 mg | Freq: Once | INTRAMUSCULAR | Status: AC
  Administered 2016-04-22: 5 mg via INTRAVENOUS
  Filled 2016-04-22: qty 2

## 2016-04-22 MED ORDER — ONDANSETRON HCL 4 MG PO TABS: 4.0000 mg | ORAL_TABLET | Freq: Four times a day (QID) | ORAL | Status: DC | PRN

## 2016-04-22 MED ORDER — ATORVASTATIN CALCIUM 10 MG PO TABS
10.0000 mg | ORAL_TABLET | Freq: Every day | ORAL | Status: DC
  Administered 2016-04-23 (×2): 10 mg via ORAL
  Filled 2016-04-22 (×2): qty 1

## 2016-04-22 MED ORDER — BUDESONIDE 0.25 MG/2ML IN SUSP
0.2500 mg | Freq: Two times a day (BID) | RESPIRATORY_TRACT | Status: DC
  Administered 2016-04-22 – 2016-04-24 (×4): 0.25 mg via RESPIRATORY_TRACT
  Filled 2016-04-22 (×4): qty 2

## 2016-04-22 MED ORDER — ARFORMOTEROL TARTRATE 15 MCG/2ML IN NEBU
15.0000 ug | INHALATION_SOLUTION | Freq: Two times a day (BID) | RESPIRATORY_TRACT | Status: DC
  Administered 2016-04-22 – 2016-04-24 (×4): 15 ug via RESPIRATORY_TRACT
  Filled 2016-04-22 (×4): qty 2

## 2016-04-22 MED ORDER — ZOLPIDEM TARTRATE 5 MG PO TABS: 5.0000 mg | ORAL_TABLET | Freq: Every evening | ORAL | Status: DC | PRN

## 2016-04-22 MED ORDER — INSULIN ASPART 100 UNIT/ML ~~LOC~~ SOLN
0.0000 [IU] | Freq: Three times a day (TID) | SUBCUTANEOUS | Status: DC
  Administered 2016-04-23: 15 [IU] via SUBCUTANEOUS
  Administered 2016-04-23: 11 [IU] via SUBCUTANEOUS

## 2016-04-22 NOTE — Progress Notes (Signed)
Radiation Oncology         [phone] ________________________________  Name: Allison [name] MRN: [id number]  Date: 04/22/2016  DOB: 03/28/1951  Post Treatment Note  CC: [facility]  [facility]  Diagnosis:   Metastatic Renal Cell Carcinoma to the brain with recent alteration in dexamethasone taper and subsequent lower extremity weakness and loss of motor function.   Interval Since Last Radiation:  3 weeks   04/03/2016: 1. PTV1 Left frontal 13 mm was treated to 20 Gy in 1 fraction. 2. PTV2 Right frontal 10 mm was treated to 20 Gy in 1 fraction.   Narrative:  The patient returns today for a work in visit. She started out with dexamethasone 4 mg 4 times a day for her edema seen on her initial MRI on 03/30/2016. After treatment, a taper was given to the patient, and she was encouraged to taper to 3 times a day, on 04/14/2016, she started twice a day dosing. Since that time, the patient's walking his become less stable, and she is now unable to stand without falling to the ground. Her husband has had to carry her around and put her into the car to come over to the office today. She reports that she has intact sensation in her extremities, but her legs are so weak that she can't stand at all. She denies any changes in terms of bowel or bladder incontinence, and reports that she is not having any new musculoskeletal pain.she denies any nausea or vomiting, rule auditory changes, and denies any headaches. From time to time she does experience low back discomfort, this predates her treatment. Although initially I thought that she had a MRI of the lumbar spine, it does not appear that this has been performed.she denies any chest pain or shortness of breath, fevers or chills. No other complaints or verbalized.                             ALLERGIES:  is allergic to ace inhibitors; amoxicillin-pot clavulanate; and codeine.  Meds: Current Outpatient Prescriptions    Medication Sig Dispense Refill  . albuterol (PROVENTIL HFA;VENTOLIN HFA) 108 (90 Base) MCG/ACT inhaler Inhale 2 puffs into the lungs every 6 (six) hours as needed for wheezing or shortness of breath (shortness of breath). 1 Inhaler 3  . apixaban (ELIQUIS) 5 MG TABS tablet Take 1 tablet (5 mg total) by mouth 2 (two) times daily. 180 tablet 2  . dexamethasone (DECADRON) 4 MG tablet Take 1 tablet (4 mg total) by mouth 4 (four) times daily. Then, April 07, 2016 (Monday) begin taking 4 mg three times daily for seven days. Then, 4 mg two times daily until follow up with provider. (Patient taking differently: Take 4 mg by mouth 2 (two) times daily. ) 120 tablet 0  . diltiazem (CARDIZEM CD) 120 MG 24 hr capsule Take 1 capsule (120 mg total) by mouth daily. 30 capsule 10  . diphenhydramine-acetaminophen (TYLENOL PM) 25-500 MG TABS Take 2 tablets by mouth daily as needed (for sleep).     Marland Kitchen escitalopram (LEXAPRO) 20 MG tablet Take 20 mg by mouth at bedtime.    . Fluticasone Furoate (ARNUITY ELLIPTA) 100 MCG/ACT AEPB Inhale 1 puff into the lungs daily. (Patient taking differently: Inhale 1 puff into the lungs every morning. ) 30 each 3  . furosemide (LASIX) 20 MG tablet TAKE 1 TABLET EVERY MORNING. (Patient taking differently: TAKE 20  MG BY MOUTH EVERY MORNING) 90 tablet 3  . metoprolol (LOPRESSOR) 50 MG tablet Take 100 mg by mouth every morning. Take 75mg  by mouth in the evening    . potassium chloride (K-DUR,KLOR-CON) 10 MEQ tablet Take 10 mEq by mouth every morning.     . simvastatin (ZOCOR) 20 MG tablet Take 20 mg by mouth daily at 6 PM.    . Tiotropium Bromide-Olodaterol (STIOLTO RESPIMAT) 2.5-2.5 MCG/ACT AERS Inhale 2 puffs into the lungs daily. (Patient taking differently: Inhale 2 puffs into the lungs every morning. ) 1 Inhaler 3  . LORazepam (ATIVAN) 0.5 MG tablet 1 tablet po 30 minutes prior to radiation or MRI (Patient not taking: Reported on 04/22/2016) 30 tablet 0  . senna-docusate (SENOKOT-S)  8.6-50 MG tablet Take 1 tablet by mouth 2 (two) times daily. (Patient not taking: Reported on 04/22/2016) 60 tablet 1   No current facility-administered medications for this encounter.     Physical Findings:  height is 5\' 7"  (1.702 m). Her temperature is 98.6 F (37 C). Her blood pressure is 127/62 and her pulse is 90.  In general this is a chronically appearing caucasian female in no acute distress. She's alert and oriented x4 and appropriate throughout the examination. Cardiopulmonary assessment is negative for acute distress and she exhibits normal effort. She has intact perception to light touch of her upper and lower extremities bilaterally. She has normal grip strength bilaterally and her upper extremity strength is 5/5. However, her lower extremities are very weak with strength at 2-3/5.    Lab Findings: Lab Results  Component Value Date   WBC 12.4 (H) 03/25/2016   HGB 14.6 03/25/2016   HGB 15.6 (H) 03/25/2016   HCT 45.1 03/25/2016   HCT 46.0 03/25/2016   MCV 90.4 03/25/2016   PLT 405 (H) 03/25/2016     Radiographic Findings: Mr Jeri Cos [id number] Contrast  Result Date: 03/30/2016 CLINICAL DATA:  65 year old female with metastatic renal cell carcinoma. 2 metastatic brain lesions suspected on recent routine brain MRI done for right side weakness. Planning for stereotactic radio surgery. Subsequent encounter. EXAM: MRI HEAD WITHOUT AND WITH CONTRAST TECHNIQUE: Multiplanar, multiecho pulse sequences of the brain and surrounding structures were obtained without and with intravenous contrast. CONTRAST:  17mL MULTIHANCE GADOBENATE DIMEGLUMINE 529 MG/ML IV SOLN COMPARISON:  MRI 03/25/2016 and earlier. FINDINGS: Brain: 11 mm diameter anterior right frontal lobe white matter metastasis appears stable (series 10, image 108). No overt associated blood products on T2 * imaging. 14 mm left posterior frontal lobe superior white matter metastasis with hemosiderin also appears stable (series 10, image 112).  No other brain metastasis identified. No dural thickening identified. Both lesions above are associated with vasogenic edema in the surrounding brain parenchyma which is stable since 03/26/2016. Mild associated regional mass effect, more so on the left. No restricted diffusion or evidence of acute infarction. Stable ventricle size and configuration. No extra-axial collection or other acute or chronic cerebral blood products. Normal basilar cisterns. Negative pituitary, cervicomedullary junction and visualized cervical spinal cord. Vascular: Major intracranial vascular flow voids are stable. Skull and upper cervical spine: Visible bone marrow signal appears stable and within normal limits. Chronic hyperostosis of the calvarium. Negative visualized cervical vertebrae. Sinuses/Orbits: Negative. Other: Visible internal auditory structures appear normal. IMPRESSION: 1. Stable 11 mm anterior right frontal lobe white matter and 14 mm left posterior superior frontal lobe white matter metastases, with some associated hemosiderin. Stable associated vasogenic edema and mild regional mass effect. 2. No other metastatic  disease or new intracranial abnormality identified. Electronically Signed   By: Genevie Ann M.D.   On: 03/30/2016 19:17   Mr Jeri Cos [id number] Contrast  Result Date: 03/26/2016 CLINICAL DATA:  Initial evaluation for weakness of right-sided extremities. EXAM: MRI HEAD WITHOUT AND WITH CONTRAST TECHNIQUE: Multiplanar, multiecho pulse sequences of the brain and surrounding structures were obtained without and with intravenous contrast. CONTRAST:  70mL MULTIHANCE GADOBENATE DIMEGLUMINE 529 MG/ML IV SOLN COMPARISON:  Prior CT from earlier the same day as well as previous MRI from 01/30/2014. FINDINGS: Brain: Cerebral volume within normal limits. Mild chronic microvascular ischemic changes present within the periventricular white matter. There are 2 metastatic lesions. One positioned within the posterior left centrum semi  ovale measures 11 x 13 x 15 mm (series 11, image 37). Surrounding vasogenic edema without midline shift. Small amount of chronic blood products/ necrosis on gradient echo sequence. Second metastatic lesion positioned within the subcortical white matter of the anterior right frontal lobe and measures 10 x 9 x 11 mm (series 11, image 33). Surrounding vasogenic edema without significant mass effect. No associated hemorrhage. No other mass lesion or abnormal enhancement identified. No evidence for acute infarct. Gray-white matter differentiation otherwise maintained. No areas of chronic infarction. No midline shift. No hydrocephalus. Mild mass effect on the left lateral ventricle related to the left cerebral metastatic lesion. No extra-axial fluid collection. Major dural sinuses are grossly patent. Pituitary gland within normal limits. Vascular: Major intracranial vascular flow voids are maintained. Skull and upper cervical spine: Craniocervical junction normal. Visualized upper cervical spine unremarkable. Bone marrow signal intensity normal. No scalp soft tissue abnormality. Sinuses/Orbits: Globes and orbits within normal limits. Paranasal sinuses are clear. Trace opacity bilateral mastoid air cells. Inner ear structures grossly normal. Other: No other significant finding. IMPRESSION: Two metastatic lesions involving the anterior right frontal lobe and left centrum semi ovale as above. Associated vasogenic edema without midline shift. Electronically Signed   By: Jeannine Boga M.D.   On: 03/26/2016 01:07   Ct Head Code Stroke W/o Cm  Result Date: 03/25/2016 CLINICAL DATA:  Code stroke. RIGHT-sided weakness and confusion, last seen normal at 1600 hours. History of metastatic renal cell carcinoma, atrial fibrillation. EXAM: CT HEAD WITHOUT CONTRAST TECHNIQUE: Contiguous axial images were obtained from the base of the skull through the vertex without intravenous contrast. COMPARISON:  CT HEAD January 01, 2016  FINDINGS: BRAIN: New LEFT centrum semiovale 10 mm rounded density with extensive surrounding hypodense vasogenic edema. New RIGHT frontal lobe subcortical 7 mm dense lesion with vasogenic edema. Local mass effect without midline shift. Moderate ventriculomegaly on the basis of global parenchymal brain volume loss, stable from prior imaging. No acute large vascular territory infarcts. No abnormal extra-axial fluid collections. Basal cisterns are patent . VASCULAR: Moderate calcific atherosclerosis of the carotid siphons. SKULL: No skull fracture. Multiple sub cm lytic lesions in the calvarium. No significant scalp soft tissue swelling. SINUSES/ORBITS: The mastoid air-cells and included paranasal sinuses are well-aerated.The included ocular globes and orbital contents are non-suspicious. OTHER: None. ASPECTS [facility] Stroke Program Early CT Score) - Ganglionic level infarction (caudate, lentiform nuclei, internal capsule, insula, M1-M3 cortex): 7 - Supraganglionic infarction (M4-M6 cortex): 3 Total score (0-10 with 10 being normal): 10 IMPRESSION: 1. 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 No acute large vascular territory infarct. Sub cm lytic lesions in the calvarium, suspicious for osseous metastasis. 2. ASPECTS is 10. Acute findings discussed with  and reconfirmed by Dr.Dr. Nicole Kindred, Neurology On 03/25/2016 at 8:20 pm. Electronically Signed   By: Elon Alas M.D.   On: 03/25/2016 20:24    Impression/Plan: 1. Metastatic Renal Cell Carcinoma to the brain with recent alteration in dexamethasone taper and subsequent lower extremity weakness and loss of motor function. I have spoken with Dr. Tammi Klippel and Dr. Alen Blew about her symptoms and they are both in agreement to have her be evaluated and recommend STAT CT brain without contrast to rule out hemorrhage of her mets. It also be reasonable to consider an MRI scan of her lumbar  spine to rule out cord compression. If these are negative, we would recommend increasing her dexamethasone to 4mg  TID rather than BID she is currently on. We appreciate their evaluation of the patient but are worried that she will need more long term PT evaluation and possible SNF placement to be coordinated during her admission.      Carola Rhine, PAC

## 2016-04-22 NOTE — ED Notes (Signed)
Pt placed on urinal but unable to void at this time.

## 2016-04-22 NOTE — H&P (Signed)
History and Physical  Allison [name] [id number] DOB: July 22, 1950 DOA: 04/22/2016  Referring physician: Jeneen Rinks PCP: [facility]  Outpatient Specialists:  1. [id number] - Heme/Onc  Chief Complaint: Generalized weakness  HPI: Allison [name] is a 65 y.o. 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  Husband has been having to carry her last 2 days.  She underwent left kidney resection several years ago. No direct recurrence until August of this year when she presented with CNS symptoms including a right foot drop. She was dizzy and also developed right foot drop. CT showed bifrontal lesions. She underwent SRS radiotherapy 9/28 with Dr. Tammi Klippel. Her symptoms initially improved and she was placed on high-dose steroids with a planned slow taper.  She had been on 4 mg of Decadron 4 times a day initially and now down to twice per day. They're decreasing steroids every 2 weeks. Last dose change was one week ago. She is currently on 4 mg twice a day. She's had progressive weakness of the bilateral extremities for the last week but much worse last 2 days. She cannot walk for the last 2 days her husband is having to carry her. She cannot support her weight with his assistance and using a walker.  [facility] Radiation oncology PA sent patient to ED.  She was noted to be in Afib with RVR. She had not taken any of her oral meds today.  She received IV steroid in ED and developed a steroid psychosis requiring sedation with IV valium.  She was not able to get a full MRI of lumbar spine because of her agitation.  In addition she received CT of lumbar spine and brain looking for new lesions but none were found.  Neurosurgeon is Dr. Trenton Gammon. Radiation oncologist Dr. Tammi Klippel. Dr. Alen Blew is Oncologist.  Review of Systems:    Constitutional: Negative for appetite change, chills, diaphoresis, fatigue and fever.  HENT: Negative for mouth sores, sore throat and trouble swallowing.   Eyes: Negative for visual disturbance.  Respiratory: Negative for cough, chest tightness, shortness of breath and wheezing.   Cardiovascular: Negative for chest pain.  Gastrointestinal: Negative for abdominal distention, abdominal pain, diarrhea, nausea and vomiting.  Endocrine: Negative for polydipsia, polyphagia and polyuria.  Genitourinary: Negative for dysuria, frequency and hematuria.  Musculoskeletal: Positive for back pain and gait problem.       Mild chronic back pain and no changes recently.  Skin: Negative for color change, pallor and rash.  Neurological: Positive for weakness. Negative for dizziness, syncope, light-headedness and headaches.  Hematological: Does not bruise/bleed easily.  Psychiatric/Behavioral: Negative for behavioral problems and confusion.   Past Medical History:  Diagnosis Date  . A-fib ([place])    a. Dx 01/2014 - rate controlled. b. Recurrence 03/2014 in setting of renal cell carcinoma surgery.  . Acute pyelonephritis 01/28/2014  . Anxiety   . Carotid disease, bilateral ([place])    a. 11/2011 U/S: RICA 123XX123, LICA [id number].  Marland Kitchen COPD (chronic obstructive pulmonary disease) ([place])    uses O2 at night  . Coronary artery disease    a. 08/2009 NSTEMI s/p PCI/DES to LCX. Mod residual LAD dzs;  b. 02/2014 Nonischemic myoview.  Marland Kitchen Dysrhythmia    A-Fib  . History of kidney stones   . Hyperlipidemia   .  Hypertension   . Ischemic cardiomyopathy    a. Echo 08/14/09 with LVEF 40-45%;  b. 01/2014 Echo: EF 55-60%, basal-mid inflat and inf HK, triv MR/TR, mod dil LA/RA.  Marland Kitchen Lung nodule    a. 1cm RLL nodule - followed by pulmonology.  . Metastatic renal cell carcinoma to lung ([place]) 01/29/2014  . Pneumonia    hx of  . PVD (peripheral vascular disease) ([place])    a. 11/2010 L Fem to above knee Pop bypass;  b. 12/2011 Angio: patent L  fem->pop graft, LCFZ 50-60%.  . Shortness of breath dyspnea    with exertion  . 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 Tobacco abuse    hx of   Past Surgical History:  Procedure Laterality Date  . CARDIAC CATHETERIZATION    . CARDIAC CATHETERIZATION N/A 03/07/2016   Procedure: Left Heart Cath and Coronary Angiography;  Surgeon: Burnell Blanks, MD;  Location: [place] [facility];  Service: Cardiovascular;  Laterality: N/A;  . CARDIOVERSION N/A 05/08/2014   Procedure: CARDIOVERSION;  Surgeon: Thayer Headings, MD;  Location: [place];  Service: Cardiovascular;  Laterality: N/A;  . CORONARY STENT PLACEMENT  2011  . FEMORAL BYPASS Left May 2012  . INCISIONAL HERNIA REPAIR N/A 10/01/2015   Procedure: INCISIONAL HERNIA REPAIR;  Surgeon: Ralene Ok, MD;  Location: [place];  Service: General;  Laterality: N/A;  . INSERTION OF MESH N/A 10/01/2015   Procedure: INSERTION OF MESH;  Surgeon: Ralene Ok, MD;  Location: [place];  Service: General;  Laterality: N/A;  . LOWER EXTREMITY ANGIOGRAM  December 16, 2011  . LOWER EXTREMITY ANGIOGRAM N/A 12/16/2011   Procedure: LOWER EXTREMITY ANGIOGRAM;  Surgeon: Serafina Mitchell, MD;  Location: [facility];  Service: Cardiovascular;  Laterality: N/A;  . ROBOT ASSISTED LAPAROSCOPIC NEPHRECTOMY Left 03/22/2014   Procedure: ROBOTIC ASSISTED LAPAROSCOPIC RADICAL NEPHRECTOMY LEFT;  Surgeon: Alexis Frock, MD;  Location: [facility];  Service: Urology;  Laterality: Left;  . TONSILLECTOMY  65 years old   Social History:  reports that she quit smoking about 6 years ago. Her smoking use included Cigarettes. She has a 45.00 pack-year smoking history. She has never used smokeless tobacco. She reports that she does not drink alcohol or use drugs.   Allergies  Allergen Reactions  . Ace Inhibitors Cough  . Amoxicillin-Pot Clavulanate Hives  . Codeine Nausea And Vomiting    Family History  Problem Relation Age of Onset  . Coronary artery disease Mother      deceased. coronary artery bypass graft  . Cancer Mother     BREAST  . Heart disease Mother     PVD  - Amputation-Leg  . Hyperlipidemia Mother   . Hypertension Mother   . Heart attack Mother   . Cirrhosis Father     deceased  . Cancer Father   . Other Brother     alive, unknown hx  . Stroke Maternal Aunt      Prior to Admission medications   Medication Sig Start Date End Date Taking? Authorizing Provider  albuterol (PROVENTIL HFA;VENTOLIN HFA) 108 (90 Base) MCG/ACT inhaler Inhale 2 puffs into the lungs every 6 (six) hours as needed for wheezing or shortness of breath (shortness of breath). 07/26/15  Yes Tammy S Parrett, NP  apixaban (ELIQUIS) 5 MG TABS tablet Take 1 tablet (5 mg total) by mouth 2 (two) times daily. 01/24/16  Yes Burnell Blanks, MD  dexamethasone (DECADRON) 4 MG tablet Take 1 tablet (4 mg total) by mouth 4 (  four) times daily. Then, April 07, 2016 (Monday) begin taking 4 mg three times daily for seven days. Then, 4 mg two times daily until follow up with provider. Patient taking differently: Take 4 mg by mouth 2 (two) times daily.  04/04/16  Yes Hayden Pedro, PA-C  diltiazem (CARDIZEM CD) 120 MG 24 hr capsule Take 1 capsule (120 mg total) by mouth daily. 04/02/16  Yes Burnell Blanks, MD  diphenhydramine-acetaminophen (TYLENOL PM) 25-500 MG TABS Take 2 tablets by mouth daily as needed (for sleep).    Yes Historical Provider, MD  escitalopram (LEXAPRO) 20 MG tablet Take 20 mg by mouth at bedtime.   Yes Historical Provider, MD  Fluticasone Furoate (ARNUITY ELLIPTA) 100 MCG/ACT AEPB Inhale 1 puff into the lungs daily. Patient taking differently: Inhale 1 puff into the lungs every morning.  03/28/16  Yes Brand Males, MD  furosemide (LASIX) 20 MG tablet TAKE 1 TABLET EVERY MORNING. Patient taking differently: TAKE 20 MG BY MOUTH EVERY MORNING 03/04/16  Yes Burnell Blanks, MD  metoprolol (LOPRESSOR) 50 MG tablet Take 100 mg by mouth every morning.  Take 75mg  by mouth in the evening   Yes Historical Provider, MD  potassium chloride (K-DUR,KLOR-CON) 10 MEQ tablet Take 10 mEq by mouth every morning.    Yes Historical Provider, MD  simvastatin (ZOCOR) 20 MG tablet Take 20 mg by mouth daily at 6 PM.   Yes Historical Provider, MD  Tiotropium Bromide-Olodaterol (STIOLTO RESPIMAT) 2.5-2.5 MCG/ACT AERS Inhale 2 puffs into the lungs daily. Patient taking differently: Inhale 2 puffs into the lungs every morning.  03/04/16  Yes Brand Males, MD  LORazepam (ATIVAN) 0.5 MG tablet 1 tablet po 30 minutes prior to radiation or MRI Patient not taking: Reported on 04/22/2016 03/27/16   Hayden Pedro, PA-C  senna-docusate (SENOKOT-S) 8.6-50 MG tablet Take 1 tablet by mouth 2 (two) times daily. Patient not taking: Reported on 04/22/2016 03/26/16   Wyatt Portela, MD   Physical Exam: Vitals:   04/22/16 1458 04/22/16 1500 04/22/16 1620 04/22/16 1732  BP: 122/66 123/69 126/67 114/89  Pulse: 115 117 (!) 155 (!) 130  Resp: 13 13 18 22   Temp:   98.2 F (36.8 C)   TempSrc:   Oral   SpO2: 94% 93% 96% 94%    Constitutional: She is oriented to person, place, and time. She appears well-developed and well-nourished. No distress.  HENT: Head: Normocephalic.  Eyes: Conjunctivae are normal. Pupils are equal, round, and reactive to light. No scleral icterus.  Neck: Normal range of motion. Neck supple. No thyromegaly present.  Cardiovascular: Normal rate and regular rhythm.  Exam reveals no gallop and no friction rub.  No murmur heard. Pulmonary/Chest: Effort normal and breath sounds normal. No respiratory distress. She has no wheezes. She has no rales.  Abdominal: Soft. Bowel sounds are normal. She exhibits no distension. There is no tenderness. There is no rebound.  Musculoskeletal: Normal range of motion.  Neurological: She is sedated. Right foot drop.  Normal use and strength are upper service. 2/5 strength to bilateral lower extremities. Intact reflexes.  No asymmetry. Reports normal sensation.  Skin: Skin is warm and dry. No rash noted.  Psychiatric: She has a normal mood and affect. Her behavior is normal.   Labs on Admission:  Basic Metabolic Panel:  Recent Labs Lab 04/22/16 1313  NA 133*  K 4.0  CL 96*  CO2 25  GLUCOSE 466*  BUN 28*  CREATININE 0.86  CALCIUM 8.9  Liver Function Tests:  Recent Labs Lab 04/22/16 1313  AST 21  ALT 71*  ALKPHOS 102  BILITOT 1.0  PROT 6.3*  ALBUMIN 3.2*   No results for input(s): LIPASE, AMYLASE in the last 168 hours. No results for input(s): AMMONIA in the last 168 hours. CBC:  Recent Labs Lab 04/22/16 1313  WBC 9.5  NEUTROABS 8.7*  HGB 13.5  HCT 39.5  MCV 87.0  PLT 152   Cardiac Enzymes: No results for input(s): CKTOTAL, CKMB, CKMBINDEX, TROPONINI in the last 168 hours.  BNP (last 3 results) No results for input(s): PROBNP in the last 8760 hours. CBG:  Recent Labs Lab 04/22/16 1622  GLUCAP 389*    Radiological Exams on Admission: Ct Head Wo Contrast  Result Date: 04/22/2016 CLINICAL DATA:  Renal cell carcinoma with brain metastases. Gait disorder EXAM: CT HEAD WITHOUT CONTRAST TECHNIQUE: Contiguous axial images were obtained from the base of the skull through the vertex without intravenous contrast. COMPARISON:  Head CT March 25, 2016 and brain MRI March 30, 2016 FINDINGS: Brain: Mild diffuse atrophy is stable. There is a focal mass with surrounding vasogenic edema in the right frontal lobe, stable. The mass measures 7 x 6 mm, unchanged from recent CT examination. This mass shows increased attenuation, likely due to hypercellularity, stable. There is a second mass noted in the posterior superior left frontal lobe with extensive vasogenic edema which also shows increased attenuation, likely due to hypercellularity. This mass measures 1.0 x 0.9 cm, unchanged. The degree of vasogenic edema is stable in this area as well as in the right frontal lobe. No new mass is  evident on this noncontrast enhanced study. There is no finding felt to represent acute hemorrhage. There is no midline shift or extra-axial fluid. There is mild small vessel disease in the centra semiovale bilaterally, stable. No acute infarct is demonstrated on this study. Vascular: No hyperdense lesion evident. There are foci of calcification in each carotid siphon. Skull: The bony calvarium is somewhat osteoporotic. No blastic or lytic bone lesions are identified. No evident fracture. Sinuses/Orbits: And visualized paranasal sinuses are clear. Visualized orbits appear symmetric bilaterally. Other: Mastoid air cells are clear. IMPRESSION: There is a mass in the anterior right frontal lobe midportion as well as in the superior posterior left frontal lobe with extensive surrounding vasogenic edema in both areas. These masses appear hypercellular and unchanged. No new mass is evident on this noncontrast enhanced study. There is underlying atrophy with periventricular small vessel disease. There are no findings felt to represent acute hemorrhage or progression of edema. No midline shift. There are foci of calcification in both carotid siphon regions. Electronically Signed   By: Lowella Grip III M.D.   On: 04/22/2016 13:24   Ct Thoracic Spine Wo Contrast  Result Date: 04/22/2016 CLINICAL DATA:  Progressive gait disturbance. Progressive leg weakness. Metastatic renal cell cancer. EXAM: CT THORACIC AND LUMBAR SPINE WITHOUT CONTRAST TECHNIQUE: Multidetector CT imaging of the thoracic and lumbar spine was performed without contrast. Multiplanar CT image reconstructions were also generated. COMPARISON:  CT studies most recently 11/27/2015 FINDINGS: CT THORACIC SPINE FINDINGS Alignment: Normal Vertebrae: No fracture or destructive lesion Paraspinal and other soft tissues: Multiple pulmonary metastases as previously evaluated. Aortic atherosclerosis. Disc levels: No likely significant degenerative change in the  thoracic region. No stenosis of the canal or foramina. CT cannot evaluate for cord metastases. CT LUMBAR SPINE FINDINGS Segmentation: 5 lumbar type vertebral bodies. Alignment: Normal Vertebrae: No fracture or destructive lesion. Paraspinal and  other soft tissues: Previous left nephrectomy. Aortic atherosclerosis. Disc levels: No significant degenerative change. Minimal disc bulges and facet arthritis. No compressive stenosis. IMPRESSION: CT THORACIC SPINE IMPRESSION No fracture or destructive bone lesion. No apparent spinal stenosis. CT cannot evaluate for cord metastases. CT LUMBAR SPINE IMPRESSION No fracture or destructive bone lesion. No apparent stenosis. CT cannot evaluate for small intradural metastases. Electronically Signed   By: Nelson Chimes M.D.   On: 04/22/2016 17:38   Ct Lumbar Spine Wo Contrast  Result Date: 04/22/2016 CLINICAL DATA:  Progressive gait disturbance. Progressive leg weakness. Metastatic renal cell cancer. EXAM: CT THORACIC AND LUMBAR SPINE WITHOUT CONTRAST TECHNIQUE: Multidetector CT imaging of the thoracic and lumbar spine was performed without contrast. Multiplanar CT image reconstructions were also generated. COMPARISON:  CT studies most recently 11/27/2015 FINDINGS: CT THORACIC SPINE FINDINGS Alignment: Normal Vertebrae: No fracture or destructive lesion Paraspinal and other soft tissues: Multiple pulmonary metastases as previously evaluated. Aortic atherosclerosis. Disc levels: No likely significant degenerative change in the thoracic region. No stenosis of the canal or foramina. CT cannot evaluate for cord metastases. CT LUMBAR SPINE FINDINGS Segmentation: 5 lumbar type vertebral bodies. Alignment: Normal Vertebrae: No fracture or destructive lesion. Paraspinal and other soft tissues: Previous left nephrectomy. Aortic atherosclerosis. Disc levels: No significant degenerative change. Minimal disc bulges and facet arthritis. No compressive stenosis. IMPRESSION: CT THORACIC SPINE  IMPRESSION No fracture or destructive bone lesion. No apparent spinal stenosis. CT cannot evaluate for cord metastases. CT LUMBAR SPINE IMPRESSION No fracture or destructive bone lesion. No apparent stenosis. CT cannot evaluate for small intradural metastases. Electronically Signed   By: Nelson Chimes M.D.   On: 04/22/2016 17:38   Mr Lumbar Spine Wo Contrast  Result Date: 04/22/2016 CLINICAL DATA:  Metastatic renal cell cancer. Lower extremity weakness and difficulty walking. EXAM: MRI LUMBAR SPINE WITHOUT CONTRAST TECHNIQUE: Multiplanar, multisequence MR imaging of the lumbar spine was performed. No intravenous contrast was administered. COMPARISON:  CT same day FINDINGS: The patient became claustrophobic and terminated the examination after T sagittal sequences. Segmentation:  5 lumbar type vertebral bodies. Alignment:  Minimal curvature convex to the left. Vertebrae: Fatty marrow suggesting previous radiation. No marrow space lesion. No fracture. Conus medullaris: Extends to the L1-2 level and appears normal. The distal cord and conus region appear normal. Paraspinal and other soft tissues: No axial images for evaluation. Disc levels: Mild non-compressive bulging of the L2-3 disc. The other levels are negative. There is no central canal stenosis. No foraminal stenosis. No edematous facet arthropathy. IMPRESSION: Study terminated because of severe patient claustrophobia. We see the region from the inferior endplate of 624THL through S2, in there is no evidence of metastatic disease to this region. There is no spinal stenosis. The distal cord and nerve roots appear normal. The study does not show an explanation for the clinical presentation. Electronically Signed   By: Nelson Chimes M.D.   On: 04/22/2016 18:01   EKG: Independently reviewed.   Assessment/Plan Active Problems:   Essential hypertension   Coronary artery disease involving native coronary artery of native heart with angina pectoris (HCC)   COPD,  severe - MS phenotype   TOBACCO ABUSE, HX OF   Ischemic cardiomyopathy   Metastatic renal cell carcinoma to lung (HCC)   CKD (chronic kidney disease), stage III   Chronic respiratory failure (HCC)   Brain metastases (HCC)   Atrial fibrillation with RVR (HCC)   Brain tumor ([place])   Steroid-induced diabetes ([place])   Physical debility  Right foot drop   Current chronic use of systemic steroids  Afib with RVR - Pt's heart rate is in the 140-160 range.  Ordered 10 mg Cardizem IV and plan to restart home doses of lopressor and cardizem.  If unable to control, will need to start IV diltiazem infusion. Treat dehydration and hyperglycemia (noted below). Resume eliquis for anticoagulation.    Metastatic Renal Carcinoma to brain and lung - Pt currently on high dose steroids and receiving radiation treatments to brain.  Radiation oncology recommending increasing decadron back to 4 mg TID up from BID dosing.    Progressive physical debility - Initially was concerned about metastases to L spine but not seen on imaging studies.  Increasing decadron as noted above, fall precautions ordered, consultation to PT to eval and treat and likely will need SNF placement.   Steroid induced diabetes with hyperglycemia - Blood glucose >400, ordered novolog 15 units now, starting gentle IVF hydration, resistant sliding scale coverage, HS coverage and prandial novolog coverage, monitor blood glucose closely.  Monitor potassium.  Recheck BMP in AM.  Check A1c.    CKD stage 3 - monitor BMP closely.  Currently at baseline.   Acute Dehydration - secondary to hyperglycemia and poor oral intake in last 24 hours.  Ordered gentle IVFs as above.   Ischemic Cardiomyopathy - gentle hydration, monitor I/O closely and weights.    Severe COPD - resume home respiratory medications and monitor closely.   Essential Hypertension - resuming home medications and following.    CAD - no symptoms of chest pain or SOB, resume home  medications.    DVT Prophylaxis: eliquis Code Status: FULL (husband adamant that patient remain full code)  Family Communication: husband at bedside  Disposition Plan: SNF    Time spent: 29 mins  Irwin Brakeman, MD [facility] Pager [phone]  If 7PM-7AM, please contact night-coverage [email or web address] Password [email or web address] 04/22/2016, 6:10 PM

## 2016-04-22 NOTE — ED Notes (Signed)
Pt has returned back from MRI. MRI staff reported patient is confused, refused scans, and didn't do well with attempting to get scans performed. Will inform provider.

## 2016-04-22 NOTE — Progress Notes (Addendum)
Allison [name] here for report of progressive weakness of her legs. Upon touch she denies any loss of senstation in her lower extremities without any swelling.  Spouse had to physically lift her to get into a W/C today. Unable to weigh today due to weakness. Denies any issues with voiding or stool evacuation.  Denies any pain at this time. Diffuse presentation of thrush noted in her oral cavity with report of burning in her esophagus.  Eating 1 meal daily and yogurt. Report loss of teeth.  Decadron 4 mg twice daily.  BP 127/62 (BP Location: Right Arm, Patient Position: Sitting, Cuff Size: Normal)   Pulse 90   Temp 98.6 F (37 C)   Ht 5\' 7"  (1.702 m)  O2 Sat 97%  Wt Readings from Last 3 Encounters:  03/31/16 189 lb 11.2 oz (86 kg)  03/26/16 189 lb 13.1 oz (86.1 kg)  03/07/16 191 lb (86.6 kg)

## 2016-04-22 NOTE — ED Notes (Signed)
Spoke with Dr. Jeneen Rinks about an In\Out Catheter to obtain urine sample. He gave permission to perform In/Out Cath. Informed patient and family.

## 2016-04-22 NOTE — ED Notes (Signed)
Patient transported to MRI 

## 2016-04-22 NOTE — ED Notes (Signed)
Dr. Jeneen Rinks came to bedside to speak with patient and family. Dr. Jeneen Rinks reports patient has changed in the last hour. Pt is very anxious. Pt is alert, oriented to person,place, disoriented to time and situation.

## 2016-04-22 NOTE — ED Notes (Signed)
Bed: [id number] Expected date:  Expected time:  Means of arrival:  Comments: Pt from radiation Oncology; Linton Rump

## 2016-04-22 NOTE — Progress Notes (Signed)
Wheeled patient to bed 16 in the [facility] emergency room. Patient accompanied by her husband. Provided Allison [name], Therapist, sports. Assisted with transfer from wheelchair to bed. Patient tearful but, in no visible distress.

## 2016-04-22 NOTE — ED Provider Notes (Addendum)
[facility] DEPT Provider Note   CSN: [id number] Arrival date & time: 04/22/16  1159     History   Chief Complaint Chief Complaint  Patient presents with  . Weakness  . Dizziness  . Difficulty Walking    HPI Allison [name] is a 65 y.o. female.  She has a history of renal cell carcinoma. She underwent resection several years ago. No direct recurrence August of this year with CNS symptoms. She was dizzy and also developed right foot drop. CT showed bifrontal lesions. She underwent SRS radiotherapy 928. Her symptoms improved and she was placed on high-dose steroids with a planned slow taper. Was on 4 mg of Decadron 4 times a day we get after treatment. They're decreasing no skin every 2 weeks. Last dose change was one week ago. She is currently on 4 mg twice a day. She's had progressive weakness of the bilateral extremities for the last week. She cannot walk for the last 2 days her husband is having to carry her. She cannot support her weight with his assistance and using a walker. I received a call from, [place] oncology PA describing the patient and she was transferred to [place] along emergency room. Neurosurgeon is Dr. Trenton Gammon. Radiation oncologist Dr. Tammi Klippel. Dr. Alen Blew is Oncologist.    HPI  Past Medical History:  Diagnosis Date  . A-fib ([place])    a. Dx 01/2014 - rate controlled. b. Recurrence 03/2014 in setting of renal cell carcinoma surgery.  . Acute pyelonephritis 01/28/2014  . Anxiety   . Carotid disease, bilateral ([place])    a. 11/2011 U/S: RICA [id number], LICA [id number].  Marland Kitchen COPD (chronic obstructive pulmonary disease) ([place])    uses O2 at night  . Coronary artery disease    a. 08/2009 NSTEMI s/p PCI/DES to LCX. Mod residual LAD dzs;  b. 02/2014 Nonischemic myoview.  Marland Kitchen Dysrhythmia    A-Fib  . History of kidney stones   . Hyperlipidemia   . Hypertension   . Ischemic cardiomyopathy    a. Echo 08/14/09 with LVEF 40-45%;  b. 01/2014 Echo: EF 55-60%, basal-mid inflat and inf HK,  triv MR/TR, mod dil LA/RA.  Marland Kitchen Lung nodule    a. 1cm RLL nodule - followed by pulmonology.  . Metastatic renal cell carcinoma to lung ([place]) 01/29/2014  . Pneumonia    hx of  . PVD (peripheral vascular disease) ([place])    a. 11/2010 L Fem to above knee Pop bypass;  b. 12/2011 Angio: patent L fem->pop graft, LCFZ 50-60%.  . Shortness of breath dyspnea    with exertion  . Tobacco abuse    hx of    Patient Active Problem List   Diagnosis Date Noted  . Brain metastases ([place]) 03/25/2016  . Atrial fibrillation with RVR ([place]) 03/25/2016  . COPD exacerbation ([place])   . S/P hernia repair 10/01/2015  . Hypoxia   . Shortness of breath   . Acute respiratory failure with hypoxia ([place])   . Chronic respiratory failure ([place]) 08/01/2015  . Preop respiratory exam 05/25/2015  . Dyspnea and respiratory abnormality 05/25/2015  . Hyperlipidemia 02/12/2015  . Multiple lung nodules 01/30/2015  . CKD (chronic kidney disease), stage III 04/21/2014  . Nodule of right lung 02/26/2014  . Nocturnal hypoxemia 02/07/2014  . Metastatic renal cell carcinoma to lung ([place]) 01/29/2014  . Hyponatremia 01/28/2014  . Hematuria, gross 01/28/2014  . Left renal mass 01/28/2014  . Paroxysmal atrial fibrillation ([place]) 01/28/2014  . Acute pyelonephritis 01/28/2014  . Anemia  due to blood loss 01/28/2014  . Atherosclerotic PVD with intermittent claudication ([place]) 11/28/2011  . Occlusion and stenosis of carotid artery without mention of cerebral infarction 11/28/2011  . Ischemic cardiomyopathy 09/02/2011  . PAD (peripheral artery disease) ([place]) 09/02/2011  . TOBACCO ABUSE 08/30/2009  . Coronary artery disease involving native coronary artery of native heart with angina pectoris ([place]) 08/30/2009  . Peripheral vascular disease, unspecified ([place]) 08/30/2009  . Essential hypertension 08/29/2009  . COPD, severe - MS phenotype 08/29/2009  . TOBACCO ABUSE, HX OF 08/29/2009    Past Surgical History:  Procedure Laterality Date  .  CARDIAC CATHETERIZATION    . CARDIAC CATHETERIZATION N/A 03/07/2016   Procedure: Left Heart Cath and Coronary Angiography;  Surgeon: Burnell Blanks, MD;  Location: [facility];  Service: Cardiovascular;  Laterality: N/A;  . CARDIOVERSION N/A 05/08/2014   Procedure: CARDIOVERSION;  Surgeon: Thayer Headings, MD;  Location: [facility];  Service: Cardiovascular;  Laterality: N/A;  . CORONARY STENT PLACEMENT  2011  . FEMORAL BYPASS Left May 2012  . INCISIONAL HERNIA REPAIR N/A 10/01/2015   Procedure: INCISIONAL HERNIA REPAIR;  Surgeon: Ralene Ok, MD;  Location: [facility];  Service: General;  Laterality: N/A;  . INSERTION OF MESH N/A 10/01/2015   Procedure: INSERTION OF MESH;  Surgeon: Ralene Ok, MD;  Location: [facility];  Service: General;  Laterality: N/A;  . LOWER EXTREMITY ANGIOGRAM  December 16, 2011  . LOWER EXTREMITY ANGIOGRAM N/A 12/16/2011   Procedure: LOWER EXTREMITY ANGIOGRAM;  Surgeon: Serafina Mitchell, MD;  Location: [facility] CATH LAB;  Service: Cardiovascular;  Laterality: N/A;  . ROBOT ASSISTED LAPAROSCOPIC NEPHRECTOMY Left 03/22/2014   Procedure: ROBOTIC ASSISTED LAPAROSCOPIC RADICAL NEPHRECTOMY LEFT;  Surgeon: Alexis Frock, MD;  Location: [facility] OR[facility];  Service: Urology;  Laterality: Left;  . TONSILLECTOMY  65 years old    OB History    No data available       Home Medications    Prior to Admission medications   Medication Sig Start Date End Date Taking? Authorizing Provider  albuterol (PROVENTIL HFA;VENTOLIN HFA) 108 (90 Base) MCG/ACT inhaler Inhale 2 puffs into the lungs every 6 (six) hours as needed for wheezing or shortness of breath (shortness of breath). 07/26/15  Yes Tammy S Parrett, NP  apixaban (ELIQUIS) 5 MG TABS tablet Take 1 tablet (5 mg total) by mouth 2 (two) times daily. 01/24/16  Yes Burnell Blanks, MD  dexamethasone (DECADRON) 4 MG tablet Take 1 tablet (4 mg total) by mouth 4 (four) times daily. Then, April 07, 2016 (Monday) begin taking 4 mg three  times daily for seven days. Then, 4 mg two times daily until follow up with provider. Patient taking differently: Take 4 mg by mouth 2 (two) times daily.  04/04/16  Yes Hayden Pedro, PA-C  diltiazem (CARDIZEM CD) 120 MG 24 hr capsule Take 1 capsule (120 mg total) by mouth daily. 04/02/16  Yes Burnell Blanks, MD  diphenhydramine-acetaminophen (TYLENOL PM) 25-500 MG TABS Take 2 tablets by mouth daily as needed (for sleep).    Yes Historical Provider, MD  escitalopram (LEXAPRO) 20 MG tablet Take 20 mg by mouth at bedtime.   Yes Historical Provider, MD  Fluticasone Furoate (ARNUITY ELLIPTA) 100 MCG/ACT AEPB Inhale 1 puff into the lungs daily. Patient taking differently: Inhale 1 puff into the lungs every morning.  03/28/16  Yes Brand Males, MD  furosemide (LASIX) 20 MG tablet TAKE 1 TABLET EVERY MORNING. Patient taking differently: TAKE 20 MG BY MOUTH  EVERY MORNING 03/04/16  Yes Burnell Blanks, MD  metoprolol (LOPRESSOR) 50 MG tablet Take 100 mg by mouth every morning. Take 75mg  by mouth in the evening   Yes Historical Provider, MD  potassium chloride (K-DUR,KLOR-CON) 10 MEQ tablet Take 10 mEq by mouth every morning.    Yes Historical Provider, MD  simvastatin (ZOCOR) 20 MG tablet Take 20 mg by mouth daily at 6 PM.   Yes Historical Provider, MD  Tiotropium Bromide-Olodaterol (STIOLTO RESPIMAT) 2.5-2.5 MCG/ACT AERS Inhale 2 puffs into the lungs daily. Patient taking differently: Inhale 2 puffs into the lungs every morning.  03/04/16  Yes Brand Males, MD  LORazepam (ATIVAN) 0.5 MG tablet 1 tablet po 30 minutes prior to radiation or MRI Patient not taking: Reported on 04/22/2016 03/27/16   Hayden Pedro, PA-C  senna-docusate (SENOKOT-S) 8.6-50 MG tablet Take 1 tablet by mouth 2 (two) times daily. Patient not taking: Reported on 04/22/2016 03/26/16   Wyatt Portela, MD    Family History Family History  Problem Relation Age of Onset  . Coronary artery disease Mother      deceased. coronary artery bypass graft  . Cancer Mother     BREAST  . Heart disease Mother     PVD  - Amputation-Leg  . Hyperlipidemia Mother   . Hypertension Mother   . Heart attack Mother   . Cirrhosis Father     deceased  . Cancer Father   . Other Brother     alive, unknown hx  . Stroke Maternal Aunt     Social History Social History  Substance Use Topics  . Smoking status: Former Smoker    Packs/day: 1.50    Years: 30.00    Types: Cigarettes    Quit date: 08/12/2009  . Smokeless tobacco: Never Used     Comment: she has smoker 1-2 packs daily since the age of 76. 41 pack years  . Alcohol use No     Allergies   Ace inhibitors; Amoxicillin-pot clavulanate; and Codeine   Review of Systems Review of Systems  Constitutional: Negative for appetite change, chills, diaphoresis, fatigue and fever.  HENT: Negative for mouth sores, sore throat and trouble swallowing.   Eyes: Negative for visual disturbance.  Respiratory: Negative for cough, chest tightness, shortness of breath and wheezing.   Cardiovascular: Negative for chest pain.  Gastrointestinal: Negative for abdominal distention, abdominal pain, diarrhea, nausea and vomiting.  Endocrine: Negative for polydipsia, polyphagia and polyuria.  Genitourinary: Negative for dysuria, frequency and hematuria.  Musculoskeletal: Positive for back pain and gait problem.       Mild chronic back pain and no changes recently.  Skin: Negative for color change, pallor and rash.  Neurological: Positive for weakness. Negative for dizziness, syncope, light-headedness and headaches.  Hematological: Does not bruise/bleed easily.  Psychiatric/Behavioral: Negative for behavioral problems and confusion.     Physical Exam Updated Vital Signs BP 126/67 (BP Location: Left Arm)   Pulse (!) 155   Temp 98.2 F (36.8 C) (Oral)   Resp 18   SpO2 96%   Physical Exam  Constitutional: She is oriented to person, place, and time. She appears  well-developed and well-nourished. No distress.  HENT:  Head: Normocephalic.  Eyes: Conjunctivae are normal. Pupils are equal, round, and reactive to light. No scleral icterus.  Neck: Normal range of motion. Neck supple. No thyromegaly present.  Cardiovascular: Normal rate and regular rhythm.  Exam reveals no gallop and no friction rub.   No murmur heard. Pulmonary/Chest:  Effort normal and breath sounds normal. No respiratory distress. She has no wheezes. She has no rales.  Abdominal: Soft. Bowel sounds are normal. She exhibits no distension. There is no tenderness. There is no rebound.  Musculoskeletal: Normal range of motion.  Neurological: She is alert and oriented to person, place, and time.  Normal use and strength are upper service. 2/5 strength to bilateral lower extremities. Intact reflexes. No asymmetry. Reports normal sensation.  Skin: Skin is warm and dry. No rash noted.  Psychiatric: She has a normal mood and affect. Her behavior is normal.     ED Treatments / Results  Labs (all labs ordered are listed, but only abnormal results are displayed) Labs Reviewed  CBC WITH DIFFERENTIAL/PLATELET - Abnormal; Notable for the following:       Result Value   RDW 15.6 (*)    Neutro Abs 8.7 (*)    Lymphs Abs 0.3 (*)    All other components within normal limits  COMPREHENSIVE METABOLIC PANEL - Abnormal; Notable for the following:    Sodium 133 (*)    Chloride 96 (*)    Glucose, Bld 466 (*)    BUN 28 (*)    Total Protein 6.3 (*)    Albumin 3.2 (*)    ALT 71 (*)    All other components within normal limits  PROTIME-INR - Abnormal; Notable for the following:    Prothrombin Time 17.8 (*)    All other components within normal limits  URINALYSIS, ROUTINE W REFLEX MICROSCOPIC (NOT AT [facility]) - Abnormal; Notable for the following:    Specific Gravity, Urine 1.040 (*)    Glucose, UA >1000 (*)    Ketones, ur 15 (*)    Protein, ur 30 (*)    All other components within normal limits  CBG  MONITORING, ED - Abnormal; Notable for the following:    Glucose-Capillary 389 (*)    All other components within normal limits  URINE MICROSCOPIC-ADD ON    EKG  EKG Interpretation None       Radiology Ct Head Wo Contrast  Result Date: 04/22/2016 CLINICAL DATA:  Renal cell carcinoma with brain metastases. Gait disorder EXAM: CT HEAD WITHOUT CONTRAST TECHNIQUE: Contiguous axial images were obtained from the base of the skull through the vertex without intravenous contrast. COMPARISON:  Head CT March 25, 2016 and brain MRI March 30, 2016 FINDINGS: Brain: Mild diffuse atrophy is stable. There is a focal mass with surrounding vasogenic edema in the right frontal lobe, stable. The mass measures 7 x 6 mm, unchanged from recent CT examination. This mass shows increased attenuation, likely due to hypercellularity, stable. There is a second mass noted in the posterior superior left frontal lobe with extensive vasogenic edema which also shows increased attenuation, likely due to hypercellularity. This mass measures 1.0 x 0.9 cm, unchanged. The degree of vasogenic edema is stable in this area as well as in the right frontal lobe. No new mass is evident on this noncontrast enhanced study. There is no finding felt to represent acute hemorrhage. There is no midline shift or extra-axial fluid. There is mild small vessel disease in the centra semiovale bilaterally, stable. No acute infarct is demonstrated on this study. Vascular: No hyperdense lesion evident. There are foci of calcification in each carotid siphon. Skull: The bony calvarium is somewhat osteoporotic. No blastic or lytic bone lesions are identified. No evident fracture. Sinuses/Orbits: And visualized paranasal sinuses are clear. Visualized orbits appear symmetric bilaterally. Other: Mastoid air cells are clear.  IMPRESSION: There is a mass in the anterior right frontal lobe midportion as well as in the superior posterior left frontal lobe with  extensive surrounding vasogenic edema in both areas. These masses appear hypercellular and unchanged. No new mass is evident on this noncontrast enhanced study. There is underlying atrophy with periventricular small vessel disease. There are no findings felt to represent acute hemorrhage or progression of edema. No midline shift. There are foci of calcification in both carotid siphon regions. Electronically Signed   By: Lowella Grip III M.D.   On: 04/22/2016 13:24    Procedures Procedures (including critical care time)  Medications Ordered in ED Medications  LORazepam (ATIVAN) injection 1 mg (1 mg Intravenous Given 04/22/16 1518)  dexamethasone (DECADRON) injection 10 mg (10 mg Intravenous Given 04/22/16 1401)  diazepam (VALIUM) injection 5 mg (5 mg Intravenous Given 04/22/16 1628)     Initial Impression / Assessment and Plan / ED Course  I have reviewed the triage vital signs and the nursing notes.  Pertinent labs & imaging results that were available during my care of the patient were reviewed by me and considered in my medical decision making (see chart for details).  Clinical Course    Visual known recurrent metastatic renal cell carcinoma. No worsening lower extremity weakness known 5 frontal lobe lesions with edema. CT scan shows no change in size of surrounding vasogenic edema for the patient's tumor bulk. She is on a request but no acute hemorrhage noted. I discussed the case with Dr. Alen Blew. I requested MRI of the back to rule out metastatic lesions and spinal cord compression. She has no loss of bowel or bladder function. She states she is claustrophobic. Was given IV Decadron for her vasogenic edema. Was given Ativan. Upon going to MRI patient was not able to tolerate the MRI became agitated and had return to emergency room.   Potential diagnoses would be reaction to eye dose IV steroids versus paradoxical reaction to Ativan. Was given IV Valium in simple CT scanning of the  thoracic and lumbar spine were obtained. Formal interpretation is pending but on my interpretation I do not see obvious infiltrating lesion to the thoracic or lumbar spine vertebral bodies and no obvious hard spinal space narrowing.   I will discuss the case with hospitalist regarding admission. Dr. Alen Blew will see the patient in consultation as inpatient.  17:45:  Patient discussed with, and will be admitted to Dr. Wynetta Emery of [facility] hospitalist.  Final Clinical Impressions(s) / ED Diagnoses   Final diagnoses:  Leg weakness    New Prescriptions New Prescriptions   No medications on file     Tanna Furry, MD 04/22/16 [place], MD 04/22/16 1746

## 2016-04-22 NOTE — ED Notes (Signed)
Pt returned back from CT.

## 2016-04-22 NOTE — ED Notes (Signed)
Pt in CT.

## 2016-04-22 NOTE — ED Triage Notes (Signed)
Patient here from radiation with complaints of "legs feeling heavier", difficulty walking, and lightheadedness. Renal cell carcinoma with brain metastasis. Denies bladder/bowel problems.

## 2016-04-22 NOTE — Telephone Encounter (Signed)
Received message from Mont Dutton, RT that the patient is unwell. Patient eceived SRS to left frontal brain lesion on 9/28. Reports her current decadron dose is 4 mg bid. Reports over the last week and a half her legs have gradually felt heavier and she feels lightheaded. Reports new onset of ulcerations in mouth that are probably indicative of thrush. Denies any bowel or bladder complaints . Denies any edema of her lower legs. Instructed patient to present at 1030 to be evaluated by Shona Simpson, PA-C. Patient verbalized understanding.

## 2016-04-22 NOTE — ED Notes (Signed)
During hourly rounding on this pt, it was noted that pt removed IV herself.

## 2016-04-23 LAB — COMPREHENSIVE METABOLIC PANEL
ALK PHOS: 83 U/L (ref 38–126)
ALT: 56 U/L — ABNORMAL HIGH (ref 14–54)
ANION GAP: 6 (ref 5–15)
AST: 16 U/L (ref 15–41)
Albumin: 2.7 g/dL — ABNORMAL LOW (ref 3.5–5.0)
BILIRUBIN TOTAL: 1.1 mg/dL (ref 0.3–1.2)
BUN: 32 mg/dL — ABNORMAL HIGH (ref 6–20)
CALCIUM: 8.8 mg/dL — AB (ref 8.9–10.3)
CO2: 28 mmol/L (ref 22–32)
Chloride: 105 mmol/L (ref 101–111)
Creatinine, Ser: 0.76 mg/dL (ref 0.44–1.00)
GFR calc non Af Amer: >60 mL/min (ref >60)
GLUCOSE: 267 mg/dL — AB (ref 65–99)
Potassium: 4.7 mmol/L (ref 3.5–5.1)
Sodium: 139 mmol/L (ref 135–145)
TOTAL PROTEIN: 5.7 g/dL — AB (ref 6.5–8.1)

## 2016-04-23 LAB — GLUCOSE, CAPILLARY
GLUCOSE-CAPILLARY: 300 mg/dL — AB (ref 65–99)
Glucose-Capillary: 332 mg/dL — ABNORMAL HIGH (ref 65–99)

## 2016-04-23 MED ORDER — INSULIN ASPART 100 UNIT/ML ~~LOC~~ SOLN
8.0000 [IU] | Freq: Three times a day (TID) | SUBCUTANEOUS | Status: DC
  Administered 2016-04-23 – 2016-04-24 (×3): 8 [IU] via SUBCUTANEOUS

## 2016-04-23 MED ORDER — METOPROLOL TARTRATE 50 MG PO TABS
100.0000 mg | ORAL_TABLET | Freq: Every day | ORAL | Status: DC
  Administered 2016-04-23: 100 mg via ORAL
  Filled 2016-04-23: qty 2

## 2016-04-23 MED ORDER — MAGIC MOUTHWASH W/LIDOCAINE
15.0000 mL | Freq: Four times a day (QID) | ORAL | Status: DC | PRN
Start: 2016-04-23 — End: 2016-04-24
  Administered 2016-04-23: 15 mL via ORAL
  Filled 2016-04-23 (×2): qty 15

## 2016-04-23 MED ORDER — INSULIN GLARGINE 100 UNIT/ML ~~LOC~~ SOLN
15.0000 [IU] | Freq: Every day | SUBCUTANEOUS | Status: DC
  Administered 2016-04-23: 15 [IU] via SUBCUTANEOUS
  Filled 2016-04-23 (×2): qty 0.15

## 2016-04-23 MED ORDER — DILTIAZEM HCL ER COATED BEADS 180 MG PO CP24
180.0000 mg | ORAL_CAPSULE | Freq: Every day | ORAL | Status: DC
  Administered 2016-04-24: 180 mg via ORAL
  Filled 2016-04-23: qty 1

## 2016-04-23 MED ORDER — METOPROLOL TARTRATE 5 MG/5ML IV SOLN: 2.5000 mg | INTRAVENOUS | Status: DC | PRN

## 2016-04-23 MED ORDER — NYSTATIN 100000 UNIT/ML MT SUSP
5.0000 mL | Freq: Four times a day (QID) | OROMUCOSAL | Status: DC
  Administered 2016-04-23 – 2016-04-24 (×3): 500000 [IU] via ORAL
  Filled 2016-04-23 (×3): qty 5

## 2016-04-23 MED ORDER — INSULIN ASPART 100 UNIT/ML ~~LOC~~ SOLN
0.0000 [IU] | Freq: Three times a day (TID) | SUBCUTANEOUS | Status: DC
  Administered 2016-04-23: 3 [IU] via SUBCUTANEOUS
  Administered 2016-04-24: 5 [IU] via SUBCUTANEOUS
  Administered 2016-04-24: 11 [IU] via SUBCUTANEOUS

## 2016-04-23 NOTE — Progress Notes (Signed)
Pt did not void during shift. Bladder scanner revealed pt was retaining 432 ml of urine. Pt had in and out cath done in ED. On call NP Schorr made aware. New orders placed for foley catheter. Will continue to monitor.

## 2016-04-23 NOTE — Clinical Social Work Placement (Signed)
   CLINICAL SOCIAL WORK PLACEMENT  NOTE  Date:  04/23/2016  Patient Details  Name: Allison [name] MRN: [id number] Date of Birth: [birth year]  Clinical Social Work is seeking post-discharge placement for this patient at the [place] level of care (*CSW will initial, date and re-position this form in  chart as items are completed):  Yes   Patient/family provided with [facility] Work Department's list of facilities offering this level of care within the geographic area requested by the patient (or if unable, by the patient's family).  Yes   Patient/family informed of their freedom to choose among providers that offer the needed level of care, that participate in Medicare, Medicaid or managed care program needed by the patient, have an available bed and are willing to accept the patient.  Yes   Patient/family informed of [facility]'s ownership interest in [facility] and [facility], as well as of the fact that they are under no obligation to receive care at these facilities.  PASRR submitted to EDS on 04/23/16     PASRR number received on 04/23/16     Existing PASRR number confirmed on       FL2 transmitted to all facilities in geographic area requested by pt/family on 04/23/16     FL2 transmitted to all facilities within larger geographic area on       Patient informed that his/her managed care company has contracts with or will negotiate with certain facilities, including the following:        Yes   Patient/family informed of bed offers received.  Patient chooses bed at       Physician recommends and patient chooses bed at      Patient to be transferred to   on  .  Patient to be transferred to facility by       Patient family notified on   of transfer.  Name of family member notified:        PHYSICIAN       Additional Comment:    _______________________________________________ Standley Brooking, LCSW 04/23/2016, 2:28 PM

## 2016-04-23 NOTE — Evaluation (Signed)
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 Pt admitted for treatment of brain tumor and resulting deficits.   Clinical Impression  Pt admitted with above. Pt currently with functional limitations due to the deficits listed below (see Problem List). Pt will benefit from skilled PT to increase their independence and safety with mobility to allow discharge. Pt pre-medicated with Cardizem prior to PT evaluation due to increased pulse; remained between 130-170bpm during mobility. Pt able to perform bed mobility and participate in strength/sensation/coordination assessment, but unable to perform transfer or gait due to fatigue/weakness and increased pulse rate. Pt reported feeling significant weakness today but would continue to do what she was able to maintain her strength. Pt agreeable to SNF if safest option.    Follow Up Recommendations SNF    Equipment Recommendations  None recommended by PT    Recommendations for Other Services       Precautions / Restrictions Precautions Precautions: Fall Restrictions Weight Bearing Restrictions: No      Mobility  Bed Mobility Overal bed mobility: Needs Assistance Bed Mobility: Supine to Sit;Sit to Supine     Supine to sit: Min guard;HOB elevated Sit to supine: Min assist;HOB elevated   General bed mobility comments: guarding for safety with supine to sit; assist for R LE with sit to supine; verbal cues to perform all bed mobility slowly to avoid raising HR  Transfers Overall transfer level: Needs assistance Equipment used: Rolling walker (2 wheeled) Transfers: Sit to/from Stand Sit to Stand: Max assist         General transfer  comment: attempted to assist pt with sit to stand 2x; pt unable to achieve standing position due to weakness/fatigue; pt HR fluctuated between 130-170 during attempts  Ambulation/Gait             General Gait Details: could not attempt during evaluation due to weakness/strength and increase in HR  Stairs            Wheelchair Mobility    Modified Rankin (Stroke Patients Only)       Balance Overall balance assessment: No apparent balance deficits (not formally assessed);Needs assistance (no reported history of falls) Sitting-balance support: Feet supported;No upper extremity supported Sitting balance-Leahy Scale: Fair Sitting balance - Comments: required both feet on floor for balance when sitting on EOB; able to perform UE strength/coordination assessment with no LOB episodes                                     Pertinent Vitals/Pain Pain Assessment: No/denies pain    Home Living Family/patient expects to be discharged to:: Private residence Living Arrangements: Spouse/significant other;Children Available Help at Discharge: Family;Available PRN/intermittently Type of Home: House Home Access: Stairs to enter Entrance Stairs-Rails: Can reach both Entrance Stairs-Number of Steps: 4 Home Layout: One level Home Equipment: Walker - 2 wheels (pt reported she asked about having BSC ordered) Additional Comments: pt reports living with husband and other family members; all able to help PRN dependent upon work schedule    Prior Function Level of Independence: Needs assistance   Gait / Transfers Assistance Needed: reports that daughter assists with  sit to/from stand and guards as she ambulates with RW in her home           Hand Dominance        Extremity/Trunk Assessment   Upper Extremity Assessment: Overall WFL for tasks assessed           Lower Extremity Assessment: Generalized weakness;RLE deficits/detail (R > L) RLE Deficits / Details: R  foot drop observed, demonstrated difficulty performing coordinated movements with R foot; hip flexion 2+ R LE, PF/DF 2+ R LE       Communication   Communication: No difficulties  Cognition Arousal/Alertness: Awake/alert Behavior During Therapy: WFL for tasks assessed/performed Overall Cognitive Status: Within Functional Limits for tasks assessed                      General Comments      Exercises     Assessment/Plan    PT Assessment Patient needs continued PT services  PT Problem List Decreased strength;Decreased activity tolerance;Decreased mobility;Decreased coordination;Decreased knowledge of use of DME          PT Treatment Interventions DME instruction;Gait training;Stair training;Functional mobility training;Therapeutic activities;Therapeutic exercise;Patient/family education    PT Goals (Current goals can be found in the Care Plan section)  Acute Rehab PT Goals Patient Stated Goal: regain strength and endurance to ambulate in home with RW PT Goal Formulation: With patient Time For Goal Achievement: 04/30/16 Potential to Achieve Goals: Good    Frequency Min 3X/week   Barriers to discharge        Co-evaluation               End of Session Equipment Utilized During Treatment: Gait belt Activity Tolerance: Patient limited by fatigue;Treatment limited secondary to medical complications (Comment) (limited by increased HR) Patient left: in bed;with call bell/phone within reach (MD present) Nurse Communication: Mobility status         Time: 1027-1050 PT Time Calculation (min) (ACUTE ONLY): 23 min   Charges:   PT Evaluation $PT Eval Moderate Complexity: 1 Procedure     PT G CodesDewitt Hoes 2016/05/08, 1:08 PM Dewitt Hoes, SPT

## 2016-04-23 NOTE — NC FL2 (Signed)
[place] LEVEL OF CARE SCREENING TOOL     IDENTIFICATION  Patient Name: Allison [name] Birthdate: 04/05/1951 Sex: female Admission Date (Current Location): 04/22/2016  [place] and [place] Number:  Marina Gravel [id number] T Facility and Address:  [facility],  [name] [address] [place]      Provider Number: [id number]  Attending Physician Name and Address:  Patrecia Pour, MD  Relative Name and Phone Number:       Current Level of Care: Hospital Recommended Level of Care: [place] Prior Approval Number:    Date Approved/Denied:   PASRR Number: [id number]  Discharge Plan: SNF    Current Diagnoses: Patient Active Problem List   Diagnosis Date Noted  . Brain tumor ([place]) 04/22/2016  . Steroid-induced diabetes ([place]) 04/22/2016  . Physical debility 04/22/2016  . Right foot drop 04/22/2016  . Current chronic use of systemic steroids 04/22/2016  . Leg weakness   . Brain metastases ([place]) 03/25/2016  . Atrial fibrillation with RVR ([place]) 03/25/2016  . COPD exacerbation ([place])   . S/P hernia repair 10/01/2015  . Hypoxia   . Shortness of breath   . Acute respiratory failure with hypoxia ([place])   . Chronic respiratory failure ([place]) 08/01/2015  . Preop respiratory exam 05/25/2015  . Dyspnea and respiratory abnormality 05/25/2015  . Hyperlipidemia 02/12/2015  . Multiple lung nodules 01/30/2015  . CKD (chronic kidney disease), stage III 04/21/2014  . Nodule of right lung 02/26/2014  . Nocturnal hypoxemia 02/07/2014  . 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 Hyponatremia 01/28/2014  . Hematuria, gross 01/28/2014  . Left renal mass 01/28/2014  . Paroxysmal atrial fibrillation ([place]) 01/28/2014  . Anemia due to blood loss 01/28/2014  . Atherosclerotic PVD with intermittent claudication ([place]) 11/28/2011  . Occlusion and stenosis of carotid artery without mention of cerebral infarction 11/28/2011  . Ischemic  cardiomyopathy 09/02/2011  . PAD (peripheral artery disease) ([place]) 09/02/2011  . TOBACCO ABUSE 08/30/2009  . Coronary artery disease involving native coronary artery of native heart with angina pectoris ([place]) 08/30/2009  . Peripheral vascular disease, unspecified ([place]) 08/30/2009  . Essential hypertension 08/29/2009  . COPD, severe - MS phenotype 08/29/2009  . TOBACCO ABUSE, HX OF 08/29/2009    Orientation RESPIRATION BLADDER Height & Weight     Self, Time, Situation, Place  Normal Incontinent, Indwelling catheter Weight: 174 lb 12.8 oz (79.3 kg) Height:  5\' 7"  (170.2 cm)  BEHAVIORAL SYMPTOMS/MOOD NEUROLOGICAL BOWEL NUTRITION STATUS      Continent Diet (Heart Healthy/Carb Modified)  AMBULATORY STATUS COMMUNICATION OF NEEDS Skin   Extensive Assist Verbally Normal                       Personal Care Assistance Level of Assistance  Bathing, Dressing Bathing Assistance: Limited assistance   Dressing Assistance: Limited assistance     Functional Limitations Info             SPECIAL CARE FACTORS FREQUENCY  PT (By licensed PT), OT (By licensed OT)     PT Frequency: 5 OT Frequency: 5            Contractures      Additional Factors Info  Code Status, Allergies Code Status Info: Fullcode Allergies Info: Ace Inhibitors, Amoxicillin-pot Clavulanate, Codeine           Current Medications (04/23/2016):  This is the current hospital active medication list Current Facility-Administered Medications  Medication Dose Route Frequency Provider Last  Rate Last Dose  . acetaminophen (TYLENOL) tablet 650 mg  650 mg Oral Q6H PRN Clanford Marisa Hua, MD       Or  . acetaminophen (TYLENOL) suppository 650 mg  650 mg Rectal Q6H PRN Clanford L Johnson, MD      . albuterol (PROVENTIL) (2.5 MG/3ML) 0.083% nebulizer solution 2.5 mg  2.5 mg Nebulization Q2H PRN Clanford Marisa Hua, MD      . apixaban (ELIQUIS) tablet 5 mg  5 mg Oral BID Clanford Marisa Hua, MD   5 mg at 04/23/16 0939   . arformoterol (BROVANA) nebulizer solution 15 mcg  15 mcg Nebulization BID Clanford Marisa Hua, MD   15 mcg at 04/23/16 0956  . atorvastatin (LIPITOR) tablet 10 mg  10 mg Oral q1800 Berton Mount, RPH   10 mg at 04/23/16 0015  . budesonide (PULMICORT) nebulizer solution 0.25 mg  0.25 mg Nebulization BID Berton Mount, RPH   0.25 mg at 04/23/16 0956  . dexamethasone (DECADRON) tablet 4 mg  4 mg Oral Q8H Clanford Marisa Hua, MD   4 mg at 04/23/16 0536  . diltiazem (CARDIZEM CD) 24 hr capsule 120 mg  120 mg Oral Daily Clanford Marisa Hua, MD   120 mg at 04/23/16 0939  . escitalopram (LEXAPRO) tablet 20 mg  20 mg Oral QHS Clanford Marisa Hua, MD   20 mg at 04/22/16 2106  . [START ON 04/24/2016] furosemide (LASIX) tablet 20 mg  20 mg Oral q morning - 10a Clanford L Johnson, MD      . insulin aspart (novoLOG) injection 0-20 Units  0-20 Units Subcutaneous TID WC Clanford Marisa Hua, MD   11 Units at 04/23/16 [id number]  . insulin aspart (novoLOG) injection 0-5 Units  0-5 Units Subcutaneous QHS Clanford Marisa Hua, MD   1 Units at 04/23/16 0015  . insulin aspart (novoLOG) injection 6 Units  6 Units Subcutaneous TID WC Clanford Marisa Hua, MD   6 Units at 04/23/16 [phone]  . metoprolol tartrate (LOPRESSOR) tablet 100 mg  100 mg Oral Daily Clanford Marisa Hua, MD   100 mg at 04/23/16 0934  . metoprolol tartrate (LOPRESSOR) tablet 75 mg  75 mg Oral QHS Clanford Marisa Hua, MD   75 mg at 04/22/16 2107  . ondansetron (ZOFRAN) tablet 4 mg  4 mg Oral Q6H PRN Clanford Marisa Hua, MD       Or  . ondansetron (ZOFRAN) injection 4 mg  4 mg Intravenous Q6H PRN Clanford Marisa Hua, MD      . Derrill Memo ON 04/24/2016] potassium chloride (K-DUR,KLOR-CON) CR tablet 10 mEq  10 mEq Oral Daily Clanford Marisa Hua, MD      . senna-docusate (Senokot-S) tablet 1 tablet  1 tablet Oral QHS PRN Clanford Marisa Hua, MD      . sodium chloride flush (NS) 0.9 % injection 3 mL  3 mL Intravenous Q12H Clanford L Johnson, MD      . umeclidinium bromide  (INCRUSE ELLIPTA) 62.5 MCG/INH 1 puff  1 puff Inhalation Daily Clanford Marisa Hua, MD   1 puff at 04/23/16 [id number]     Discharge Medications: Please see discharge summary for a list of discharge medications.  Relevant Imaging Results:  Relevant Lab Results:   Additional Information SSN: [id number]  Standley Brooking, LCSW

## 2016-04-23 NOTE — Clinical Social Work Note (Signed)
Clinical Social Work Assessment  Patient Details  Name: Allison [name] MRN: [id number] Date of Birth: 24-Jan-1951  Date of referral:  04/23/16               Reason for consult:  Facility Placement                Permission sought to share information with:  Chartered certified accountant granted to share information::  Yes, Verbal Permission Granted  Name::        Agency::     Relationship::     Contact Information:     Housing/Transportation Living arrangements for the past 2 months:  Single Family Home Source of Information:  Patient Patient Interpreter Needed:  None Criminal Activity/Legal Involvement Pertinent to Current Situation/Hospitalization:  No - Comment as needed Significant Relationships:  Adult Children, Spouse Lives with:  Adult Children, Spouse Do you feel safe going back to the place where you live?  No Need for family participation in patient care:  No (Coment)  Care giving concerns:  CSW reviewed PT evaluation recommending SNF at discharge.    Social Worker assessment / plan:  CSW spoke with patient re: discharge planning. Patient is agreeable with plan for SNF, stating that though her husband, children & grandchildren live with her - that they are all busy and unable to assist her.   Employment status:  Disabled (Comment on whether or not currently receiving Disability) Insurance information:  Medicaid In [place] PT Recommendations:  [place] / Referral to community resources:  [place]  Patient/Family's Response to care:  CSW explained that she would need to stay at [facility] for 30 days in order for Medicaid to cover SNF stay - patient understands & feels that she would benefit from staying that long anyway.   Patient/Family's Understanding of and Emotional Response to Diagnosis, Current Treatment, and Prognosis:  Patient reports that she feels wiped out & tired.   Emotional Assessment Appearance:  Appears  older than stated age Attitude/Demeanor/Rapport:    Affect (typically observed):    Orientation:  Oriented to Self, Oriented to Place, Oriented to  Time, Oriented to Situation Alcohol / Substance use:    Psych involvement (Current and /or in the community):     Discharge Needs  Concerns to be addressed:    Readmission within the last 30 days:    Current discharge risk:    Barriers to Discharge:      Standley Brooking, LCSW 04/23/2016, 2:25 PM

## 2016-04-23 NOTE — Progress Notes (Signed)
Inpatient Diabetes Program Recommendations  AACE/ADA: New Consensus Statement on Inpatient Glycemic Control (2015)  Target Ranges:  Prepandial:   less than 140 mg/dL      Peak postprandial:   less than 180 mg/dL (1-2 hours)      Critically ill patients:  140 - 180 mg/dL   Results for MINNETTA, PROTHRO (MRN [id number]) as of 04/23/2016 13:37  Ref. Range 04/22/2016 16:22 04/22/2016 18:34 04/22/2016 21:54 04/23/2016 08:12  Glucose-Capillary Latest Ref Range: 65 - 99 mg/dL 389 (H) 409 (H) 321 (H) 300 (H)     Admit Weakness/ Afib w/ RVR. Hx of Renal Cancer w/ Mets to Brain and Lungs.   Also has Hx of "Steroid-Induced DM". Takes Decadron at home.   Home DM Meds: None.   Current Insulin Orders: Novolog Resistant Correction Scale/ SSI (0-20 units) TID AC + HS                                       Novolog 6 units TIDWC      -Current A1c pending.   -Patient has orders for Decadron 4 mg Q8hr.   -CBGs 300 mg/dl range today.     MD- Please consider the following in-hospital insulin adjustments while patient is getting Decadron and having elevated glucose levels:  1. Start basal insulin: Lantus 15 units daily (0.2 units/kg dosing based on weight of 79 kg)  2. Increase Novolog Meal Coverage to 8 units TIDWC (hold if pt eats <50% of meal)       --Will follow patient during hospitalization--  Wyn Quaker RN, MSN, CDE Diabetes Coordinator Inpatient Glycemic Control Team Team Pager: [phone] (8a-5p)

## 2016-04-23 NOTE — Progress Notes (Signed)
   04/23/16 1600  Clinical Encounter Type  Visited With Patient  Visit Type Initial  Referral From Social work  Consult/Referral To Chaplain  Spiritual Encounters  Spiritual Needs Emotional  CHP visited with patient and provided presence and emotional support.  Roe Coombs 04/23/16

## 2016-04-23 NOTE — Progress Notes (Signed)
Radiation Oncology         [phone] ________________________________  Name: Allison [name] MRN: [id number]  Date: 04/22/2016  DOB: Dec 03, 1950  Post Treatment Note  CC: [facility]  No ref. provider found  Diagnosis:   Metastatic renal cell carcinoma to the lung and brain.  Interval Since Last Radiation:  3 weeks   SRS to the brain on 04/03/16 in one fraction  Narrative:  The patient appears to be feeling much better. Unfortunately she could not tolerate MRI after having a behavioral outburst after IV dexamethasone and Ativan. She did have a CT of the thoracic and lumbar spine without contrast that was negative for cord compression or apparent disease/stenosis.                               On review of systems, the patient states she is feeling better. She denies being able to walk however. She reports she does not remember her outburst in the ED and is very embarrassed. No new complaints are noted.   ALLERGIES:  is allergic to ace inhibitors; amoxicillin-pot clavulanate; and codeine.  Meds: Current Facility-Administered Medications  Medication Dose Route Frequency Provider Last Rate Last Dose  . acetaminophen (TYLENOL) tablet 650 mg  650 mg Oral Q6H PRN Clanford Marisa Hua, MD       Or  . acetaminophen (TYLENOL) suppository 650 mg  650 mg Rectal Q6H PRN Clanford L Johnson, MD      . albuterol (PROVENTIL) (2.5 MG/3ML) 0.083% nebulizer solution 2.5 mg  2.5 mg Nebulization Q2H PRN Clanford Marisa Hua, MD      . apixaban (ELIQUIS) tablet 5 mg  5 mg Oral BID Clanford Marisa Hua, MD   5 mg at 04/23/16 0939  . arformoterol (BROVANA) nebulizer solution 15 mcg  15 mcg Nebulization BID Clanford Marisa Hua, MD   15 mcg at 04/23/16 0956  . atorvastatin (LIPITOR) tablet 10 mg  10 mg Oral q1800 Berton Mount, RPH   10 mg at 04/23/16 0015  . budesonide (PULMICORT) nebulizer solution 0.25 mg  0.25 mg Nebulization BID Berton Mount, RPH   0.25 mg at 04/23/16 0956  .  dexamethasone (DECADRON) tablet 4 mg  4 mg Oral Q8H Clanford Marisa Hua, MD   4 mg at 04/23/16 1307  . diltiazem (CARDIZEM CD) 24 hr capsule 120 mg  120 mg Oral Daily Clanford Marisa Hua, MD   120 mg at 04/23/16 0939  . escitalopram (LEXAPRO) tablet 20 mg  20 mg Oral QHS Clanford Marisa Hua, MD   20 mg at 04/22/16 2106  . [START ON 04/24/2016] furosemide (LASIX) tablet 20 mg  20 mg Oral q morning - 10a Clanford L Johnson, MD      . insulin aspart (novoLOG) injection 0-20 Units  0-20 Units Subcutaneous TID WC Clanford Marisa Hua, MD   15 Units at 04/23/16 1306  . insulin aspart (novoLOG) injection 0-5 Units  0-5 Units Subcutaneous QHS Clanford Marisa Hua, MD   1 Units at 04/23/16 0015  . insulin aspart (novoLOG) injection 6 Units  6 Units Subcutaneous TID WC Clanford Marisa Hua, MD   6 Units at 04/23/16 1307  . metoprolol tartrate (LOPRESSOR) tablet 100 mg  100 mg Oral Daily Clanford Marisa Hua, MD   100 mg at 04/23/16 0934  . metoprolol tartrate (LOPRESSOR) tablet 75 mg  75 mg Oral QHS Clanford Marisa Hua, MD  75 mg at 04/22/16 2107  . ondansetron (ZOFRAN) tablet 4 mg  4 mg Oral Q6H PRN Clanford Marisa Hua, MD       Or  . ondansetron (ZOFRAN) injection 4 mg  4 mg Intravenous Q6H PRN Clanford Marisa Hua, MD      . Derrill Memo ON 04/24/2016] potassium chloride (K-DUR,KLOR-CON) CR tablet 10 mEq  10 mEq Oral Daily Clanford Marisa Hua, MD      . senna-docusate (Senokot-S) tablet 1 tablet  1 tablet Oral QHS PRN Clanford Marisa Hua, MD      . sodium chloride flush (NS) 0.9 % injection 3 mL  3 mL Intravenous Q12H Clanford L Johnson, MD      . umeclidinium bromide (INCRUSE ELLIPTA) 62.5 MCG/INH 1 puff  1 puff Inhalation Daily Clanford Marisa Hua, MD   1 puff at 04/23/16 0956    Physical Findings:  height is 5\' 7"  (1.702 m) and weight is 174 lb 12.8 oz (79.3 kg). Her oral temperature is 97.7 F (36.5 C). Her blood pressure is 131/63 and her pulse is 125 (abnormal). Her respiration is 16 and oxygen saturation is 98%.  In  general this is a well appearing caucasian female in no acute distress. She's alert and oriented x4 and appropriate throughout the examination. Cardiopulmonary assessment is negative for acute distress and she exhibits normal effort. She has slightly more notable strength in her lower extremities bilaterally, but is still too weak to walk. Oropharynx is negative for thrush.  Lab Findings: Lab Results  Component Value Date   WBC 9.5 04/22/2016   HGB 13.5 04/22/2016   HCT 39.5 04/22/2016   MCV 87.0 04/22/2016   PLT 152 04/22/2016     Radiographic Findings: Ct Head Wo Contrast  Result Date: 04/22/2016 CLINICAL DATA:  Renal cell carcinoma with brain metastases. Gait disorder EXAM: CT HEAD WITHOUT CONTRAST TECHNIQUE: Contiguous axial images were obtained from the base of the skull through the vertex without intravenous contrast. COMPARISON:  Head CT March 25, 2016 and brain MRI March 30, 2016 FINDINGS: Brain: Mild diffuse atrophy is stable. There is a focal mass with surrounding vasogenic edema in the right frontal lobe, stable. The mass measures 7 x 6 mm, unchanged from recent CT examination. This mass shows increased attenuation, likely due to hypercellularity, stable. There is a second mass noted in the posterior superior left frontal lobe with extensive vasogenic edema which also shows increased attenuation, likely due to hypercellularity. This mass measures 1.0 x 0.9 cm, unchanged. The degree of vasogenic edema is stable in this area as well as in the right frontal lobe. No new mass is evident on this noncontrast enhanced study. There is no finding felt to represent acute hemorrhage. There is no midline shift or extra-axial fluid. There is mild small vessel disease in the centra semiovale bilaterally, stable. No acute infarct is demonstrated on this study. Vascular: No hyperdense lesion evident. There are foci of calcification in each carotid siphon. Skull: The bony calvarium is somewhat  osteoporotic. No blastic or lytic bone lesions are identified. No evident fracture. Sinuses/Orbits: And visualized paranasal sinuses are clear. Visualized orbits appear symmetric bilaterally. Other: Mastoid air cells are clear. IMPRESSION: There is a mass in the anterior right frontal lobe midportion as well as in the superior posterior left frontal lobe with extensive surrounding vasogenic edema in both areas. These masses appear hypercellular and unchanged. No new mass is evident on this noncontrast enhanced study. There is underlying atrophy with periventricular small vessel disease. There  are no findings felt to represent acute hemorrhage or progression of edema. No midline shift. There are foci of calcification in both carotid siphon regions. Electronically Signed   By: Lowella Grip III M.D.   On: 04/22/2016 13:24   Ct Thoracic Spine Wo Contrast  Result Date: 04/22/2016 CLINICAL DATA:  Progressive gait disturbance. Progressive leg weakness. Metastatic renal cell cancer. EXAM: CT THORACIC AND LUMBAR SPINE WITHOUT CONTRAST TECHNIQUE: Multidetector CT imaging of the thoracic and lumbar spine was performed without contrast. Multiplanar CT image reconstructions were also generated. COMPARISON:  CT studies most recently 11/27/2015 FINDINGS: CT THORACIC SPINE FINDINGS Alignment: Normal Vertebrae: No fracture or destructive lesion Paraspinal and other soft tissues: Multiple pulmonary metastases as previously evaluated. Aortic atherosclerosis. Disc levels: No likely significant degenerative change in the thoracic region. No stenosis of the canal or foramina. CT cannot evaluate for cord metastases. CT LUMBAR SPINE FINDINGS Segmentation: 5 lumbar type vertebral bodies. Alignment: Normal Vertebrae: No fracture or destructive lesion. Paraspinal and other soft tissues: Previous left nephrectomy. Aortic atherosclerosis. Disc levels: No significant degenerative change. Minimal disc bulges and facet arthritis. No  compressive stenosis. IMPRESSION: CT THORACIC SPINE IMPRESSION No fracture or destructive bone lesion. No apparent spinal stenosis. CT cannot evaluate for cord metastases. CT LUMBAR SPINE IMPRESSION No fracture or destructive bone lesion. No apparent stenosis. CT cannot evaluate for small intradural metastases. Electronically Signed   By: Nelson Chimes M.D.   On: 04/22/2016 17:38   Ct Lumbar Spine Wo Contrast  Result Date: 04/22/2016 CLINICAL DATA:  Progressive gait disturbance. Progressive leg weakness. Metastatic renal cell cancer. EXAM: CT THORACIC AND LUMBAR SPINE WITHOUT CONTRAST TECHNIQUE: Multidetector CT imaging of the thoracic and lumbar spine was performed without contrast. Multiplanar CT image reconstructions were also generated. COMPARISON:  CT studies most recently 11/27/2015 FINDINGS: CT THORACIC SPINE FINDINGS Alignment: Normal Vertebrae: No fracture or destructive lesion Paraspinal and other soft tissues: Multiple pulmonary metastases as previously evaluated. Aortic atherosclerosis. Disc levels: No likely significant degenerative change in the thoracic region. No stenosis of the canal or foramina. CT cannot evaluate for cord metastases. CT LUMBAR SPINE FINDINGS Segmentation: 5 lumbar type vertebral bodies. Alignment: Normal Vertebrae: No fracture or destructive lesion. Paraspinal and other soft tissues: Previous left nephrectomy. Aortic atherosclerosis. Disc levels: No significant degenerative change. Minimal disc bulges and facet arthritis. No compressive stenosis. IMPRESSION: CT THORACIC SPINE IMPRESSION No fracture or destructive bone lesion. No apparent spinal stenosis. CT cannot evaluate for cord metastases. CT LUMBAR SPINE IMPRESSION No fracture or destructive bone lesion. No apparent stenosis. CT cannot evaluate for small intradural metastases. Electronically Signed   By: Nelson Chimes M.D.   On: 04/22/2016 17:38   Mr Jeri Cos [id number] Contrast  Result Date: 03/30/2016 CLINICAL DATA:   65 year old female with metastatic renal cell carcinoma. 2 metastatic brain lesions suspected on recent routine brain MRI done for right side weakness. Planning for stereotactic radio surgery. Subsequent encounter. EXAM: MRI HEAD WITHOUT AND WITH CONTRAST TECHNIQUE: Multiplanar, multiecho pulse sequences of the brain and surrounding structures were obtained without and with intravenous contrast. CONTRAST:  73mL MULTIHANCE GADOBENATE DIMEGLUMINE 529 MG/ML IV SOLN COMPARISON:  MRI 03/25/2016 and earlier. FINDINGS: Brain: 11 mm diameter anterior right frontal lobe white matter metastasis appears stable (series 10, image 108). No overt associated blood products on T2 * imaging. 14 mm left posterior frontal lobe superior white matter metastasis with hemosiderin also appears stable (series 10, image 112). No other brain metastasis identified. No dural thickening identified. Both lesions above  are associated with vasogenic edema in the surrounding brain parenchyma which is stable since 03/26/2016. Mild associated regional mass effect, more so on the left. No restricted diffusion or evidence of acute infarction. Stable ventricle size and configuration. No extra-axial collection or other acute or chronic cerebral blood products. Normal basilar cisterns. Negative pituitary, cervicomedullary junction and visualized cervical spinal cord. Vascular: Major intracranial vascular flow voids are stable. Skull and upper cervical spine: Visible bone marrow signal appears stable and within normal limits. Chronic hyperostosis of the calvarium. Negative visualized cervical vertebrae. Sinuses/Orbits: Negative. Other: Visible internal auditory structures appear normal. IMPRESSION: 1. Stable 11 mm anterior right frontal lobe white matter and 14 mm left posterior superior frontal lobe white matter metastases, with some associated hemosiderin. Stable associated vasogenic edema and mild regional mass effect. 2. No other metastatic disease or  new intracranial abnormality identified. Electronically Signed   By: Genevie Ann M.D.   On: 03/30/2016 19:17   Mr Jeri Cos [id number] Contrast  Result Date: 03/26/2016 CLINICAL DATA:  Initial evaluation for weakness of right-sided extremities. EXAM: MRI HEAD WITHOUT AND WITH CONTRAST TECHNIQUE: Multiplanar, multiecho pulse sequences of the brain and surrounding structures were obtained without and with intravenous contrast. CONTRAST:  12mL MULTIHANCE GADOBENATE DIMEGLUMINE 529 MG/ML IV SOLN COMPARISON:  Prior CT from earlier the same day as well as previous MRI from 01/30/2014. FINDINGS: Brain: Cerebral volume within normal limits. Mild chronic microvascular ischemic changes present within the periventricular white matter. There are 2 metastatic lesions. One positioned within the posterior left centrum semi ovale measures 11 x 13 x 15 mm (series 11, image 37). Surrounding vasogenic edema without midline shift. Small amount of chronic blood products/ necrosis on gradient echo sequence. Second metastatic lesion positioned within the subcortical white matter of the anterior right frontal lobe and measures 10 x 9 x 11 mm (series 11, image 33). Surrounding vasogenic edema without significant mass effect. No associated hemorrhage. No other mass lesion or abnormal enhancement identified. No evidence for acute infarct. Gray-white matter differentiation otherwise maintained. No areas of chronic infarction. No midline shift. No hydrocephalus. Mild mass effect on the left lateral ventricle related to the left cerebral metastatic lesion. No extra-axial fluid collection. Major dural sinuses are grossly patent. Pituitary gland within normal limits. Vascular: Major intracranial vascular flow voids are maintained. Skull and upper cervical spine: Craniocervical junction normal. Visualized upper cervical spine unremarkable. Bone marrow signal intensity normal. No scalp soft tissue abnormality. Sinuses/Orbits: Globes and orbits within normal  limits. Paranasal sinuses are clear. Trace opacity bilateral mastoid air cells. Inner ear structures grossly normal. Other: No other significant finding. IMPRESSION: Two metastatic lesions involving the anterior right frontal lobe and left centrum semi ovale as above. Associated vasogenic edema without midline shift. Electronically Signed   By: Jeannine Boga M.D.   On: 03/26/2016 01:07   Mr Lumbar Spine Wo Contrast  Result Date: 04/22/2016 CLINICAL DATA:  Metastatic renal cell cancer. Lower extremity weakness and difficulty walking. EXAM: MRI LUMBAR SPINE WITHOUT CONTRAST TECHNIQUE: Multiplanar, multisequence MR imaging of the lumbar spine was performed. No intravenous contrast was administered. COMPARISON:  CT same day FINDINGS: The patient became claustrophobic and terminated the examination after T sagittal sequences. Segmentation:  5 lumbar type vertebral bodies. Alignment:  Minimal curvature convex to the left. Vertebrae: Fatty marrow suggesting previous radiation. No marrow space lesion. No fracture. Conus medullaris: Extends to the L1-2 level and appears normal. The distal cord and conus region appear normal. Paraspinal and other soft tissues: No  axial images for evaluation. Disc levels: Mild non-compressive bulging of the L2-3 disc. The other levels are negative. There is no central canal stenosis. No foraminal stenosis. No edematous facet arthropathy. IMPRESSION: Study terminated because of severe patient claustrophobia. We see the region from the inferior endplate of 624THL through S2, in there is no evidence of metastatic disease to this region. There is no spinal stenosis. The distal cord and nerve roots appear normal. The study does not show an explanation for the clinical presentation. Electronically Signed   By: Nelson Chimes M.D.   On: 04/22/2016 18:01   Ct Head Code Stroke W/o Cm  Result Date: 03/25/2016 CLINICAL DATA:  Code stroke. RIGHT-sided weakness and confusion, last seen normal at  1600 hours. History of metastatic renal cell carcinoma, atrial fibrillation. EXAM: CT HEAD WITHOUT CONTRAST TECHNIQUE: Contiguous axial images were obtained from the base of the skull through the vertex without intravenous contrast. COMPARISON:  CT HEAD January 01, 2016 FINDINGS: BRAIN: New LEFT centrum semiovale 10 mm rounded density with extensive surrounding hypodense vasogenic edema. New RIGHT frontal lobe subcortical 7 mm dense lesion with vasogenic edema. Local mass effect without midline shift. Moderate ventriculomegaly on the basis of global parenchymal brain volume loss, stable from prior imaging. No acute large vascular territory infarcts. No abnormal extra-axial fluid collections. Basal cisterns are patent . VASCULAR: Moderate calcific atherosclerosis of the carotid siphons. SKULL: No skull fracture. Multiple sub cm lytic lesions in the calvarium. No significant scalp soft tissue swelling. SINUSES/ORBITS: The mastoid air-cells and included paranasal sinuses are well-aerated.The included ocular globes and orbital contents are non-suspicious. OTHER: None. ASPECTS [facility] Stroke Program Early CT Score) - Ganglionic level infarction (caudate, lentiform nuclei, internal capsule, insula, M1-M3 cortex): 7 - Supraganglionic infarction (M4-M6 cortex): 3 Total score (0-10 with 10 being normal): 10 IMPRESSION: 1. 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 No acute large vascular territory infarct. Sub cm lytic lesions in the calvarium, suspicious for osseous metastasis. 2. ASPECTS is 10. Acute findings discussed with and reconfirmed by Dr.Dr. Nicole Kindred, Neurology On 03/25/2016 at 8:20 pm. Electronically Signed   By: Elon Alas M.D.   On: 03/25/2016 20:24    Impression/Plan: 1. Lower extremity weakness with a recent history of metastatic renal cell carcinoma to the brain. The patient appears to be improving with steroids. It is  curious what occurred yesterday with her change in mental status/behavior around the timing of steroids and benzodiazapines. We will continue to follow along with the patient and agree with plans to continue dexamethasone TID. 2. Thrush. This does not appear to be present today. We will continue to follow this closely.     Carola Rhine, PAC

## 2016-04-23 NOTE — Progress Notes (Signed)
PROGRESS NOTE  CARRIGAN FAHL  [id number] DOB: 1951/05/19 DOA: 04/22/2016 PCP: [facility]  Outpatient Specialists: Dr. Trenton Gammon: Neurosurgery Dr. Tammi Klippel: Rad Onc Dr. Alen Blew: Oncology  Brief Narrative: Allison [name] is a 65 y.o. 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 Her husband had been carrying her due to progressive LE weakness. She underwent left kidney resection several years ago. No direct recurrence until August of this year when she presented with CNS symptoms including a right foot drop. She was dizzy and also developed right foot drop. CT showed bifrontal lesions. She underwent SRS radiotherapy 9/28 with Dr. Tammi Klippel. Her symptoms initially improved and she was placed on high-dose steroids with a planned slow taper. She had been on 4 mg of Decadron 4 times a day initially and now down to twice per day. They're decreasing steroids every 2 weeks. Last dose change was one week ago. She is currently on 4 mg twice a day. She's had progressive weakness of the bilateral extremities for the last week but much worse last 2 days. On arrival, she was noted to be in Afib with RVR. She received IV steroids in the ED and had resultant psychosis managed with valium for sedation. She was not able to get a full MRI of lumbar spine because of her agitation. CT of the lumbar spine and brain showed no new lesions. She was admitted for further evaluation of weakness.  Assessment & Plan: Active Problems:   Essential hypertension   Coronary artery disease involving native coronary artery of native heart with angina pectoris (HCC)   COPD, severe - MS phenotype   TOBACCO ABUSE, HX OF   Ischemic cardiomyopathy   Metastatic renal cell carcinoma to lung (HCC)   CKD (chronic kidney disease), stage III   Chronic respiratory failure (HCC)   Brain metastases (HCC)   Atrial fibrillation  with RVR (HCC)   Brain tumor (HCC)   Steroid-induced diabetes ([place])   Physical debility   Right foot drop   Current chronic use of systemic steroids  Afib with RVR: Rate poorly controlled since restarting home medications. Suspect steroids worsening control.  - Treating dehydration and hyperglycemia - Continue telemetry - Continue home medications, increasing cardizem 120mg  > 180mg  daily and metoprolol tartrate 100mg  and 75mg  > 100mg  BID. - metoprolol IV prn HR > 120 bpm - Continue eliquis.   Renal cell carcinoma metastatic to brain and lung:  - Continue decadron TID dosing (up from BID taper) per Rad-Onc. - Dr. Alen Blew added to treatment team  Progressive physical debility: - Initially was concerned about metastases to L spine but not seen on imaging studies.  - If suspicion remains high, consider re-attempt of MRI (unable to perform due to agitation in ED) - Decadron as above - Fall precautions - PT eval & treat: Recommend SNF disposition. CSW consulted, FL2 signed.  Steroid-induced diabetes with hyperglycemia: Glucose remains uncontrolled.  - Continue IVF's - Start 0.2u/kg/day lantus and mealtime insulin.  - Monitor CBGs and electrolytes in AM - HbA1c pending    Stage III CKD: Currently at baseline.  - monitor BMP closely.    Ischemic cardiomyopathy with systolic dysfunction: EF [id number] with diffuse hypokinesis on echo March 2017.  - D/C IVF's, as she's taking po - Monitor I/O, daily weights.  - Resume home medications  Severe COPD: Chronic, stable without exacerbation. - resume home respiratory  medications and monitor closely.   Essential Hypertension  - resuming home medications and following.   DVT prophylaxis: Eliquis Code Status: Full, confirmed at admission. Family Communication: None at bedside this AM Disposition Plan: SNF  Consultants:   PT  Radiation oncology  Procedures:   None  Antimicrobials:  None   Subjective: Pt without complaint this  morning. Still diffusely weak, and tired from having just worked with PT. No focal weakness or change in sensation.   Objective: Vitals:   04/22/16 2032 04/23/16 0502 04/23/16 0934 04/23/16 0957  BP: 110/90 97/73 131/63   Pulse: 61 77 (!) 159 (!) 125  Resp: (!) 24 (!) 22  16  Temp: 97.6 F (36.4 C) 97.7 F (36.5 C)    TempSrc: Oral Oral    SpO2: 95% 98%  98%  Weight: 79.3 kg (174 lb 12.8 oz)     Height: 5\' 7"  (1.702 m)       Intake/Output Summary (Last 24 hours) at 04/23/16 1454 Last data filed at 04/23/16 0815  Gross per 24 hour  Intake          1128.33 ml  Output             1825 ml  Net          -696.67 ml   Filed Weights   04/22/16 2032  Weight: 79.3 kg (174 lb 12.8 oz)    Examination: General exam: Pleasant 65 y.o. female in no distress Respiratory system: Non-labored breathing room air. Clear to auscultation bilaterally.  Cardiovascular system: Regular rate and rhythm. No murmur, rub, or gallop. No JVD, and no pedal edema. Gastrointestinal system: Abdomen soft, non-tender, non-distended, with normoactive bowel sounds. No organomegaly or masses felt. Central nervous system: Alert and oriented. No focal neurological deficits. Diffusely weak. Speech normal.  Extremities: Warm, no deformities Skin: No rashes, lesions or ulcers. +Hirsutism.  Psychiatry: Judgement and insight appear normal. Mood & affect appropriate.   Data Reviewed: I have personally reviewed following labs and imaging studies  CBC:  Recent Labs Lab 04/22/16 1313  WBC 9.5  NEUTROABS 8.7*  HGB 13.5  HCT 39.5  MCV 87.0  PLT 0000000   Basic Metabolic Panel:  Recent Labs Lab 04/22/16 1313 04/23/16 0519  NA 133* 139  K 4.0 4.7  CL 96* 105  CO2 25 28  GLUCOSE 466* 267*  BUN 28* 32*  CREATININE 0.86 0.76  CALCIUM 8.9 8.8*  MG 2.2  --   PHOS 3.1  --    GFR: Estimated Creatinine Clearance: 78.1 mL/min (by C-G formula based on SCr of 0.76 mg/dL). Liver Function Tests:  Recent Labs Lab  04/22/16 1313 04/23/16 0519  AST 21 16  ALT 71* 56*  ALKPHOS 102 83  BILITOT 1.0 1.1  PROT 6.3* 5.7*  ALBUMIN 3.2* 2.7*   No results for input(s): LIPASE, AMYLASE in the last 168 hours. No results for input(s): AMMONIA in the last 168 hours. Coagulation Profile:  Recent Labs Lab 04/22/16 1313  INR 1.45   Cardiac Enzymes: No results for input(s): CKTOTAL, CKMB, CKMBINDEX, TROPONINI in the last 168 hours. BNP (last 3 results) No results for input(s): PROBNP in the last 8760 hours. HbA1C: No results for input(s): HGBA1C in the last 72 hours. CBG:  Recent Labs Lab 04/22/16 1622 04/22/16 1834 04/22/16 2154 04/23/16 0812 04/23/16 1222  GLUCAP 389* 409* 321* 300* 332*   Lipid Profile: No results for input(s): CHOL, HDL, LDLCALC, TRIG, CHOLHDL, LDLDIRECT in the last 72 hours. Thyroid  Function Tests: No results for input(s): TSH, T4TOTAL, FREET4, T3FREE, THYROIDAB in the last 72 hours. Anemia Panel: No results for input(s): VITAMINB12, FOLATE, FERRITIN, TIBC, IRON, RETICCTPCT in the last 72 hours. Urine analysis:    Component Value Date/Time   COLORURINE YELLOW 04/22/2016 [place] 04/22/2016 1645   LABSPEC 1.040 (H) 04/22/2016 1645   PHURINE 6.5 04/22/2016 1645   GLUCOSEU >1000 (A) 04/22/2016 1645   HGBUR NEGATIVE 04/22/2016 1645   BILIRUBINUR NEGATIVE 04/22/2016 1645   KETONESUR 15 (A) 04/22/2016 1645   PROTEINUR 30 (A) 04/22/2016 1645   UROBILINOGEN 0.2 03/22/2014 1713   NITRITE NEGATIVE 04/22/2016 1645   LEUKOCYTESUR NEGATIVE 04/22/2016 1645   Sepsis Labs: @LABRCNTIP (procalcitonin:4,lacticidven:4)  )No results found for this or any previous visit (from the past 240 hour(s)).   Radiology Studies: Ct Head Wo Contrast  Result Date: 04/22/2016 CLINICAL DATA:  Renal cell carcinoma with brain metastases. Gait disorder EXAM: CT HEAD WITHOUT CONTRAST TECHNIQUE: Contiguous axial images were obtained from the base of the skull through the vertex  without intravenous contrast. COMPARISON:  Head CT March 25, 2016 and brain MRI March 30, 2016 FINDINGS: Brain: Mild diffuse atrophy is stable. There is a focal mass with surrounding vasogenic edema in the right frontal lobe, stable. The mass measures 7 x 6 mm, unchanged from recent CT examination. This mass shows increased attenuation, likely due to hypercellularity, stable. There is a second mass noted in the posterior superior left frontal lobe with extensive vasogenic edema which also shows increased attenuation, likely due to hypercellularity. This mass measures 1.0 x 0.9 cm, unchanged. The degree of vasogenic edema is stable in this area as well as in the right frontal lobe. No new mass is evident on this noncontrast enhanced study. There is no finding felt to represent acute hemorrhage. There is no midline shift or extra-axial fluid. There is mild small vessel disease in the centra semiovale bilaterally, stable. No acute infarct is demonstrated on this study. Vascular: No hyperdense lesion evident. There are foci of calcification in each carotid siphon. Skull: The bony calvarium is somewhat osteoporotic. No blastic or lytic bone lesions are identified. No evident fracture. Sinuses/Orbits: And visualized paranasal sinuses are clear. Visualized orbits appear symmetric bilaterally. Other: Mastoid air cells are clear. IMPRESSION: There is a mass in the anterior right frontal lobe midportion as well as in the superior posterior left frontal lobe with extensive surrounding vasogenic edema in both areas. These masses appear hypercellular and unchanged. No new mass is evident on this noncontrast enhanced study. There is underlying atrophy with periventricular small vessel disease. There are no findings felt to represent acute hemorrhage or progression of edema. No midline shift. There are foci of calcification in both carotid siphon regions. Electronically Signed   By: Lowella Grip III M.D.   On:  04/22/2016 13:24   Ct Thoracic Spine Wo Contrast  Result Date: 04/22/2016 CLINICAL DATA:  Progressive gait disturbance. Progressive leg weakness. Metastatic renal cell cancer. EXAM: CT THORACIC AND LUMBAR SPINE WITHOUT CONTRAST TECHNIQUE: Multidetector CT imaging of the thoracic and lumbar spine was performed without contrast. Multiplanar CT image reconstructions were also generated. COMPARISON:  CT studies most recently 11/27/2015 FINDINGS: CT THORACIC SPINE FINDINGS Alignment: Normal Vertebrae: No fracture or destructive lesion Paraspinal and other soft tissues: Multiple pulmonary metastases as previously evaluated. Aortic atherosclerosis. Disc levels: No likely significant degenerative change in the thoracic region. No stenosis of the canal or foramina. CT cannot evaluate for cord metastases. CT LUMBAR SPINE  FINDINGS Segmentation: 5 lumbar type vertebral bodies. Alignment: Normal Vertebrae: No fracture or destructive lesion. Paraspinal and other soft tissues: Previous left nephrectomy. Aortic atherosclerosis. Disc levels: No significant degenerative change. Minimal disc bulges and facet arthritis. No compressive stenosis. IMPRESSION: CT THORACIC SPINE IMPRESSION No fracture or destructive bone lesion. No apparent spinal stenosis. CT cannot evaluate for cord metastases. CT LUMBAR SPINE IMPRESSION No fracture or destructive bone lesion. No apparent stenosis. CT cannot evaluate for small intradural metastases. Electronically Signed   By: Nelson Chimes M.D.   On: 04/22/2016 17:38   Ct Lumbar Spine Wo Contrast  Result Date: 04/22/2016 CLINICAL DATA:  Progressive gait disturbance. Progressive leg weakness. Metastatic renal cell cancer. EXAM: CT THORACIC AND LUMBAR SPINE WITHOUT CONTRAST TECHNIQUE: Multidetector CT imaging of the thoracic and lumbar spine was performed without contrast. Multiplanar CT image reconstructions were also generated. COMPARISON:  CT studies most recently 11/27/2015 FINDINGS: CT  THORACIC SPINE FINDINGS Alignment: Normal Vertebrae: No fracture or destructive lesion Paraspinal and other soft tissues: Multiple pulmonary metastases as previously evaluated. Aortic atherosclerosis. Disc levels: No likely significant degenerative change in the thoracic region. No stenosis of the canal or foramina. CT cannot evaluate for cord metastases. CT LUMBAR SPINE FINDINGS Segmentation: 5 lumbar type vertebral bodies. Alignment: Normal Vertebrae: No fracture or destructive lesion. Paraspinal and other soft tissues: Previous left nephrectomy. Aortic atherosclerosis. Disc levels: No significant degenerative change. Minimal disc bulges and facet arthritis. No compressive stenosis. IMPRESSION: CT THORACIC SPINE IMPRESSION No fracture or destructive bone lesion. No apparent spinal stenosis. CT cannot evaluate for cord metastases. CT LUMBAR SPINE IMPRESSION No fracture or destructive bone lesion. No apparent stenosis. CT cannot evaluate for small intradural metastases. Electronically Signed   By: Nelson Chimes M.D.   On: 04/22/2016 17:38   Mr Lumbar Spine Wo Contrast  Result Date: 04/22/2016 CLINICAL DATA:  Metastatic renal cell cancer. Lower extremity weakness and difficulty walking. EXAM: MRI LUMBAR SPINE WITHOUT CONTRAST TECHNIQUE: Multiplanar, multisequence MR imaging of the lumbar spine was performed. No intravenous contrast was administered. COMPARISON:  CT same day FINDINGS: The patient became claustrophobic and terminated the examination after T sagittal sequences. Segmentation:  5 lumbar type vertebral bodies. Alignment:  Minimal curvature convex to the left. Vertebrae: Fatty marrow suggesting previous radiation. No marrow space lesion. No fracture. Conus medullaris: Extends to the L1-2 level and appears normal. The distal cord and conus region appear normal. Paraspinal and other soft tissues: No axial images for evaluation. Disc levels: Mild non-compressive bulging of the L2-3 disc. The other levels  are negative. There is no central canal stenosis. No foraminal stenosis. No edematous facet arthropathy. IMPRESSION: Study terminated because of severe patient claustrophobia. We see the region from the inferior endplate of 624THL through S2, in there is no evidence of metastatic disease to this region. There is no spinal stenosis. The distal cord and nerve roots appear normal. The study does not show an explanation for the clinical presentation. Electronically Signed   By: Nelson Chimes M.D.   On: 04/22/2016 18:01    Scheduled Meds: . apixaban  5 mg Oral BID  . arformoterol  15 mcg Nebulization BID  . atorvastatin  10 mg Oral q1800  . budesonide (PULMICORT) nebulizer solution  0.25 mg Nebulization BID  . dexamethasone  4 mg Oral Q8H  . diltiazem  120 mg Oral Daily  . escitalopram  20 mg Oral QHS  . [START ON 04/24/2016] furosemide  20 mg Oral q morning - 10a  . insulin  aspart  0-20 Units Subcutaneous TID WC  . insulin aspart  0-5 Units Subcutaneous QHS  . insulin aspart  6 Units Subcutaneous TID WC  . metoprolol  100 mg Oral Daily  . metoprolol tartrate  75 mg Oral QHS  . [START ON 04/24/2016] potassium chloride  10 mEq Oral Daily  . sodium chloride flush  3 mL Intravenous Q12H  . umeclidinium bromide  1 puff Inhalation Daily   Continuous Infusions:    LOS: 1 day   Time spent: 25 minutes.  Vance Gather, MD [facility] Pager [phone]  If 7PM-7AM, please contact night-coverage [email or web address] Password [email or web address] 04/23/2016, 2:54 PM

## 2016-04-24 LAB — CBC
HEMATOCRIT: 37.4 % (ref 36.0–46.0)
HEMOGLOBIN: 12.4 g/dL (ref 12.0–15.0)
MCH: 29.7 pg (ref 26.0–34.0)
MCHC: 33.2 g/dL (ref 30.0–36.0)
MCV: 89.5 fL (ref 78.0–100.0)
Platelets: 164 10*3/uL (ref 150–400)
RBC: 4.18 MIL/uL (ref 3.87–5.11)
RDW: 16.1 % — AB (ref 11.5–15.5)
WBC: 9 10*3/uL (ref 4.0–10.5)

## 2016-04-24 LAB — GLUCOSE, CAPILLARY
GLUCOSE-CAPILLARY: 155 mg/dL — AB (ref 65–99)
GLUCOSE-CAPILLARY: 250 mg/dL — AB (ref 65–99)
GLUCOSE-CAPILLARY: 325 mg/dL — AB (ref 65–99)
Glucose-Capillary: 277 mg/dL — ABNORMAL HIGH (ref 65–99)

## 2016-04-24 LAB — BASIC METABOLIC PANEL
Anion gap: 8 (ref 5–15)
BUN: 35 mg/dL — AB (ref 6–20)
CALCIUM: 8.8 mg/dL — AB (ref 8.9–10.3)
CHLORIDE: 105 mmol/L (ref 101–111)
CO2: 23 mmol/L (ref 22–32)
CREATININE: 0.72 mg/dL (ref 0.44–1.00)
GFR calc non Af Amer: >60 mL/min (ref >60)
GLUCOSE: 236 mg/dL — AB (ref 65–99)
Potassium: 4.4 mmol/L (ref 3.5–5.1)
Sodium: 136 mmol/L (ref 135–145)

## 2016-04-24 LAB — MAGNESIUM: Magnesium: 2.1 mg/dL (ref 1.7–2.4)

## 2016-04-24 LAB — HEMOGLOBIN A1C
Hgb A1c MFr Bld: 8.1 % — ABNORMAL HIGH (ref 4.8–5.6)
Mean Plasma Glucose: 186 mg/dL

## 2016-04-24 MED ORDER — INSULIN ASPART 100 UNIT/ML ~~LOC~~ SOLN: 0.0000 [IU] | Freq: Three times a day (TID) | SUBCUTANEOUS | 11 refills | Status: AC

## 2016-04-24 MED ORDER — DEXAMETHASONE 4 MG PO TABS: 4.0000 mg | ORAL_TABLET | Freq: Three times a day (TID) | ORAL | Status: AC

## 2016-04-24 MED ORDER — METOPROLOL TARTRATE 100 MG PO TABS: 100.0000 mg | ORAL_TABLET | Freq: Two times a day (BID) | ORAL | Status: AC

## 2016-04-24 MED ORDER — INSULIN ASPART 100 UNIT/ML ~~LOC~~ SOLN: 0.0000 [IU] | Freq: Every day | SUBCUTANEOUS | 11 refills | Status: DC

## 2016-04-24 MED ORDER — ATORVASTATIN CALCIUM 10 MG PO TABS: 10.0000 mg | ORAL_TABLET | Freq: Every day | ORAL | Status: AC

## 2016-04-24 MED ORDER — DILTIAZEM HCL ER COATED BEADS 180 MG PO CP24: 180.0000 mg | ORAL_CAPSULE | Freq: Every day | ORAL | Status: AC

## 2016-04-24 MED ORDER — INSULIN GLARGINE 100 UNIT/ML ~~LOC~~ SOLN: 15.0000 [IU] | Freq: Every day | SUBCUTANEOUS | 11 refills | Status: AC

## 2016-04-24 MED ORDER — INSULIN ASPART 100 UNIT/ML ~~LOC~~ SOLN: 8.0000 [IU] | Freq: Three times a day (TID) | SUBCUTANEOUS | 11 refills | Status: DC

## 2016-04-24 NOTE — Progress Notes (Addendum)
Inpatient Diabetes Program Recommendations  AACE/ADA: New Consensus Statement on Inpatient Glycemic Control (2015)  Target Ranges:  Prepandial:   less than 140 mg/dL      Peak postprandial:   less than 180 mg/dL (1-2 hours)      Critically ill patients:  140 - 180 mg/dL   Results for Allison [name], Allison [name] (MRN [id number]) as of 04/24/2016 08:56  Ref. Range 04/23/2016 08:12 04/23/2016 12:22  Glucose-Capillary Latest Ref Range: 65 - 99 mg/dL 300 (H) 332 (H)   Results for Allison [name], Allison [name] (MRN [id number]) as of 04/24/2016 08:56  Ref. Range 04/22/2016 13:13  Hemoglobin A1C Latest Ref Range: 4.8 - 5.6 % 8.1 (H)     Admit Weakness/ Afib w/ RVR. Hx of Renal Cancer w/ Mets to Brain and Lungs.   Also has Hx of "Steroid-Induced DM". Takes Decadron at home.   Home DM Meds: None.   Current Insulin Orders: Novolog Moderate Correction Scale/ SSI (0-15 units) TID AC + HS                                       Novolog 8 units TIDWC       Lantus 15 units QHS      -Current A1c 8.1%- Note patient taking Decadron at home.  -Patient currently has orders for Decadron 4 mg Q8hr.   -Also note Lantus started last PM and Novolog Meal Coverage increased to 8 units TIDWC last evening.      MD- Note patient to go to SNF for Short-Term Rehab after discharge per SW notes.    Likely needs glucose lowering agent when d/c'd to SNF.  Could try an agent like Tradjenta 5 mg daily.  This DPP-4 inhibitor medication has lower risk of Hypoglycemia as it works in a glucose dependent fashion.    Patient could also take Novolog SSI short-term at the SNF as well.  Spoke with patient today about her A1c of 8.1%.  Explained what an A1c is and what it measures.  Reviewed results with pt.  Discussed with patient how the Decadron she has been taking at home has likely led to her elevated glucose levels.  Patient stated she would be more than willing to get insulin in the SNF if the nurses will give it to her (does not  want to have to give herself an injection but is OK with an RN giving her injections).  Discussed with pt that the MDs in the SNF can reassess her glucose levels once she is closer to going home from the SNF to determine if she will need insulin long-term at home.  Patient told me she would be willing to take insulin at home as well if needed after the SNF b/c her son is a paramedic and could give her the injections.      --Will follow patient during hospitalization--  Wyn Quaker RN, MSN, CDE Diabetes Coordinator Inpatient Glycemic Control Team Team Pager: [phone] (8a-5p)

## 2016-04-24 NOTE — Progress Notes (Signed)
Called report to RN at [facility]. Answered all questions. Patient will be transported via [place]. Pt aware.

## 2016-04-24 NOTE — Discharge Summary (Signed)
Physician Discharge Summary  Allison [name] [id number] DOB: 04-17-1951 DOA: 04/22/2016  PCP: [facility]  Admit date: 04/22/2016 Discharge date: 04/24/2016  Admitted From: Home Disposition: SNF   Recommendations for Outpatient Follow-up:  1. Follow up blood glucose and titrate insulin as necessary while on steroids. A1c 8.1% with hyperglycemia requiring new insulin during this admission.  2. Follow up with radiation oncology at [facility], Dr Tammi Klippel, to monitor steroid taper. To continue decadron 4mg  TID started 10/18.  3. Monitor heart rate/rhythm. Pt has had difficult-to-control but asymptomatic AFib (previous diagnosis) with RVR during admission. Metoprolol and cardizem have been increased.  4. Please provide social work or counseling services as she is undergoing appropriate grief reaction to debility and progression of cancer. Lexapro 20mg  continued, consider increasing. 5. Simvastatin changed to atorvastatin to reduce risk of myopathy with coadministration with diltiazem.  Home Health: Per SNF Equipment/Devices: Per SNF. 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  She underwent left kidney resection several years ago. No direct recurrence until August of this year when she presented with CNS symptoms including a right foot drop. She was dizzy and also developed right foot drop. CT showed bifrontal lesions. She underwent SRS radiotherapy 9/28 with Dr. Tammi Klippel. Her symptoms initially improved and she was placed on high-dose steroids with a planned slow taper.She had been on 4 mg of Decadron 4 times a day initially, planned to taper to TID > BID > daily every 2 weeks. At admission taper  had gotten to BID dosing. She's had progressive weakness of the bilateral extremities for the last week but much worse last 2 days. Her husband had been carrying her due to progressive LE weakness. On arrival, she was noted to be in Afib with RVR. She received IV steroids in the ED and had resultant psychosis managed with valium for sedation. She was not able to get a full MRI of lumbar spine because of her agitation, though images gathered did not show any spinal compression or metastases. CT of the lumbar spine and brain showed no new lesions/spinal compression. She was admitted for further evaluation of weakness. Physical therapy recommends SNF for rehabilitation due to ongoing weakness which has not improved appreciably since increasing steroids back to decadron 4mg  TID. This will be continued at discharge.   Discharge Diagnoses:  Active Problems:   Essential hypertension   Coronary artery disease involving native coronary artery of native heart with angina pectoris (HCC)   COPD, severe - MS phenotype   TOBACCO ABUSE, HX OF   Ischemic cardiomyopathy   Metastatic renal cell carcinoma to lung (HCC)   CKD (chronic kidney disease), stage III   Chronic respiratory failure (HCC)   Brain metastases (HCC)   Atrial fibrillation with RVR (HCC)   Brain tumor (HCC)   Steroid-induced diabetes ([place])   Physical debility   Right foot drop   Current chronic use of systemic steroids  Afib with RVR: Rate poorly controlled since restarting home medications. Suspect steroids worsening control.  - Treating dehydration and hyperglycemia - Continue telemetry - Continue home medications, increasing cardizem 120mg  > 180mg  daily and metoprolol tartrate 100mg  qAM and 75mg  qPM > 100mg  BID. - metoprolol IV prn HR > 120 bpm - Continue eliquis.  Renal cell carcinoma metastatic to brain and lung:  - Continue decadron TID dosing (up from BID taper) per Rad-Onc. - Dr. Alen Blew added to treatment team  Progressive  physical debility:- Initially was concerned about metastases to L spine but not seen on imaging studies.  - If suspicion remains high, consider re-attempt of MRI (unable to perform due to agitation in ED) - Decadron as above - Fall precautions - PT eval & treat: Recommend SNF disposition. CSW consulted, FL2 signed.  Grief reaction: Appropriately grieving debility in setting of metastatic cancer.  - Provided brief counseling.  - Recommend ongoing counseling/psychtherapy  - No SI/HI  Steroid-induced diabetes with hyperglycemia: Glucose remains uncontrolled but improved without acidosis or anion gap.  - Started 15u (0.2u/kg/day) lantus and mealtime insulin 10/18 with improvement. This will be continued at discharge and will require titration.    Stage III CKD: Currently at baseline.  - monitor BMP closely.   Ischemic cardiomyopathy with systolic dysfunction: EF [id number] with diffuse hypokinesis on echo March 2017.  - Monitor I/O, daily weights.  - Resumed home medications  Severe COPD: Chronic, stable without exacerbation. - resumed home respiratory medications and monitor closely.  Essential Hypertension  - resumed home medications and following.   Discharge Instructions Discharge Instructions    Diet - low sodium heart healthy    Complete by:  As directed    Discharge instructions    Complete by:  As directed    SEE DISCHARGE SUMMARY. In brief:  - Increase metoprolol to 100mg  twice daily  - Increase cardizem to 180mg  daily - Start insulin: lantus 15 units daily and novolog at mealtimes and bedtime as directed. This will require monitoring of blood glucose before meals and at bedtime.  - Stop simvastatin, replace with atorvastatin (reduce risk of muscle damage) - Continue taking decadron 4mg  tablet three times daily and follow up with radiation oncology, Dr. Johny Shears office, in the next 2 weeks to discuss tapering.   Increase activity slowly    Complete by:  As directed         Medication List    STOP taking these medications   LORazepam 0.5 MG tablet Commonly known as:  ATIVAN   simvastatin 20 MG tablet Commonly known as:  ZOCOR     TAKE these medications   albuterol 108 (90 Base) MCG/ACT inhaler Commonly known as:  PROVENTIL HFA;VENTOLIN HFA Inhale 2 puffs into the lungs every 6 (six) hours as needed for wheezing or shortness of breath (shortness of breath).   apixaban 5 MG Tabs tablet Commonly known as:  ELIQUIS Take 1 tablet (5 mg total) by mouth 2 (two) times daily.   atorvastatin 10 MG tablet Commonly known as:  LIPITOR Take 1 tablet (10 mg total) by mouth daily at 6 PM.   dexamethasone 4 MG tablet Commonly known as:  DECADRON Take 1 tablet (4 mg total) by mouth every 8 (eight) hours. What changed:  when to take this  additional instructions   diltiazem 180 MG 24 hr capsule Commonly known as:  CARDIZEM CD Take 1 capsule (180 mg total) by mouth daily. What changed:  medication strength  how much to take   diphenhydramine-acetaminophen 25-500 MG Tabs tablet Commonly known as:  TYLENOL PM Take 2 tablets by mouth daily as needed (for sleep).   escitalopram 20 MG tablet Commonly known as:  LEXAPRO Take 20 mg by mouth at bedtime.   Fluticasone Furoate 100 MCG/ACT Aepb Commonly known as:  ARNUITY ELLIPTA Inhale 1 puff  into the lungs daily. What changed:  when to take this   furosemide 20 MG tablet Commonly known as:  LASIX TAKE 1 TABLET EVERY MORNING. What changed:  See the new instructions.   insulin aspart 100 UNIT/ML injection Commonly known as:  novoLOG Inject 0-15 Units into the skin 3 (three) times daily with meals.   insulin aspart 100 UNIT/ML injection Commonly known as:  novoLOG Inject 0-5 Units into the skin at bedtime.   insulin aspart 100 UNIT/ML injection Commonly known as:  novoLOG Inject 8 Units into the skin 3 (three) times daily with meals.   insulin glargine 100 UNIT/ML injection Commonly known  as:  LANTUS Inject 0.15 mLs (15 Units total) into the skin at bedtime.   metoprolol 100 MG tablet Commonly known as:  LOPRESSOR Take 1 tablet (100 mg total) by mouth 2 (two) times daily. What changed:  medication strength  when to take this  additional instructions   potassium chloride 10 MEQ tablet Commonly known as:  K-DUR,KLOR-CON Take 10 mEq by mouth every morning.   senna-docusate 8.6-50 MG tablet Commonly known as:  Senokot-S Take 1 tablet by mouth 2 (two) times daily.   Tiotropium Bromide-Olodaterol 2.5-2.5 MCG/ACT Aers Commonly known as:  STIOLTO RESPIMAT Inhale 2 puffs into the lungs daily. What changed:  when to take this      Contact information for after-discharge care    [facility] .   Specialty:  [place]rin information: [place] [phone]             Allergies  Allergen Reactions  . Ace Inhibitors Cough  . Amoxicillin-Pot Clavulanate Hives  . Codeine Nausea And Vomiting    Consultations:  Radiation oncology, Shona Simpson, PA-C  Procedures/Studies: Ct Head Wo Contrast  Result Date: 04/22/2016 CLINICAL DATA:  Renal cell carcinoma with brain metastases. Gait disorder EXAM: CT HEAD WITHOUT CONTRAST TECHNIQUE: Contiguous axial images were obtained from the base of the skull through the vertex without intravenous contrast. COMPARISON:  Head CT March 25, 2016 and brain MRI March 30, 2016 FINDINGS: Brain: Mild diffuse atrophy is stable. There is a focal mass with surrounding vasogenic edema in the right frontal lobe, stable. The mass measures 7 x 6 mm, unchanged from recent CT examination. This mass shows increased attenuation, likely due to hypercellularity, stable. There is a second mass noted in the posterior superior left frontal lobe with extensive vasogenic edema which also shows increased attenuation, likely due to hypercellularity. This  mass measures 1.0 x 0.9 cm, unchanged. The degree of vasogenic edema is stable in this area as well as in the right frontal lobe. No new mass is evident on this noncontrast enhanced study. There is no finding felt to represent acute hemorrhage. There is no midline shift or extra-axial fluid. There is mild small vessel disease in the centra semiovale bilaterally, stable. No acute infarct is demonstrated on this study. Vascular: No hyperdense lesion evident. There are foci of calcification in each carotid siphon. Skull: The bony calvarium is somewhat osteoporotic. No blastic or lytic bone lesions are identified. No evident fracture. Sinuses/Orbits: And visualized paranasal sinuses are clear. Visualized orbits appear symmetric bilaterally. Other: Mastoid air cells are clear. IMPRESSION: There is a mass in the anterior right frontal lobe midportion as well as in the superior posterior left frontal lobe with extensive surrounding vasogenic edema in both areas. These masses appear hypercellular and unchanged. No new mass is  evident on this noncontrast enhanced study. There is underlying atrophy with periventricular small vessel disease. There are no findings felt to represent acute hemorrhage or progression of edema. No midline shift. There are foci of calcification in both carotid siphon regions. Electronically Signed   By: Lowella Grip III M.D.   On: 04/22/2016 13:24   Ct Thoracic Spine Wo Contrast  Result Date: 04/22/2016 CLINICAL DATA:  Progressive gait disturbance. Progressive leg weakness. Metastatic renal cell cancer. EXAM: CT THORACIC AND LUMBAR SPINE WITHOUT CONTRAST TECHNIQUE: Multidetector CT imaging of the thoracic and lumbar spine was performed without contrast. Multiplanar CT image reconstructions were also generated. COMPARISON:  CT studies most recently 11/27/2015 FINDINGS: CT THORACIC SPINE FINDINGS Alignment: Normal Vertebrae: No fracture or destructive lesion Paraspinal and other soft tissues:  Multiple pulmonary metastases as previously evaluated. Aortic atherosclerosis. Disc levels: No likely significant degenerative change in the thoracic region. No stenosis of the canal or foramina. CT cannot evaluate for cord metastases. CT LUMBAR SPINE FINDINGS Segmentation: 5 lumbar type vertebral bodies. Alignment: Normal Vertebrae: No fracture or destructive lesion. Paraspinal and other soft tissues: Previous left nephrectomy. Aortic atherosclerosis. Disc levels: No significant degenerative change. Minimal disc bulges and facet arthritis. No compressive stenosis. IMPRESSION: CT THORACIC SPINE IMPRESSION No fracture or destructive bone lesion. No apparent spinal stenosis. CT cannot evaluate for cord metastases. CT LUMBAR SPINE IMPRESSION No fracture or destructive bone lesion. No apparent stenosis. CT cannot evaluate for small intradural metastases. Electronically Signed   By: Nelson Chimes M.D.   On: 04/22/2016 17:38   Ct Lumbar Spine Wo Contrast  Result Date: 04/22/2016 CLINICAL DATA:  Progressive gait disturbance. Progressive leg weakness. Metastatic renal cell cancer. EXAM: CT THORACIC AND LUMBAR SPINE WITHOUT CONTRAST TECHNIQUE: Multidetector CT imaging of the thoracic and lumbar spine was performed without contrast. Multiplanar CT image reconstructions were also generated. COMPARISON:  CT studies most recently 11/27/2015 FINDINGS: CT THORACIC SPINE FINDINGS Alignment: Normal Vertebrae: No fracture or destructive lesion Paraspinal and other soft tissues: Multiple pulmonary metastases as previously evaluated. Aortic atherosclerosis. Disc levels: No likely significant degenerative change in the thoracic region. No stenosis of the canal or foramina. CT cannot evaluate for cord metastases. CT LUMBAR SPINE FINDINGS Segmentation: 5 lumbar type vertebral bodies. Alignment: Normal Vertebrae: No fracture or destructive lesion. Paraspinal and other soft tissues: Previous left nephrectomy. Aortic atherosclerosis.  Disc levels: No significant degenerative change. Minimal disc bulges and facet arthritis. No compressive stenosis. IMPRESSION: CT THORACIC SPINE IMPRESSION No fracture or destructive bone lesion. No apparent spinal stenosis. CT cannot evaluate for cord metastases. CT LUMBAR SPINE IMPRESSION No fracture or destructive bone lesion. No apparent stenosis. CT cannot evaluate for small intradural metastases. Electronically Signed   By: Nelson Chimes M.D.   On: 04/22/2016 17:38   Mr Jeri Cos [id number] Contrast  Result Date: 03/30/2016 CLINICAL DATA:  65 year old female with metastatic renal cell carcinoma. 2 metastatic brain lesions suspected on recent routine brain MRI done for right side weakness. Planning for stereotactic radio surgery. Subsequent encounter. EXAM: MRI HEAD WITHOUT AND WITH CONTRAST TECHNIQUE: Multiplanar, multiecho pulse sequences of the brain and surrounding structures were obtained without and with intravenous contrast. CONTRAST:  43mL MULTIHANCE GADOBENATE DIMEGLUMINE 529 MG/ML IV SOLN COMPARISON:  MRI 03/25/2016 and earlier. FINDINGS: Brain: 11 mm diameter anterior right frontal lobe white matter metastasis appears stable (series 10, image 108). No overt associated blood products on T2 * imaging. 14 mm left posterior frontal lobe superior white matter metastasis with hemosiderin also appears stable (  series 10, image 112). No other brain metastasis identified. No dural thickening identified. Both lesions above are associated with vasogenic edema in the surrounding brain parenchyma which is stable since 03/26/2016. Mild associated regional mass effect, more so on the left. No restricted diffusion or evidence of acute infarction. Stable ventricle size and configuration. No extra-axial collection or other acute or chronic cerebral blood products. Normal basilar cisterns. Negative pituitary, cervicomedullary junction and visualized cervical spinal cord. Vascular: Major intracranial vascular flow voids are  stable. Skull and upper cervical spine: Visible bone marrow signal appears stable and within normal limits. Chronic hyperostosis of the calvarium. Negative visualized cervical vertebrae. Sinuses/Orbits: Negative. Other: Visible internal auditory structures appear normal. IMPRESSION: 1. Stable 11 mm anterior right frontal lobe white matter and 14 mm left posterior superior frontal lobe white matter metastases, with some associated hemosiderin. Stable associated vasogenic edema and mild regional mass effect. 2. No other metastatic disease or new intracranial abnormality identified. Electronically Signed   By: Genevie Ann M.D.   On: 03/30/2016 19:17   Mr Jeri Cos [id number] Contrast  Result Date: 03/26/2016 CLINICAL DATA:  Initial evaluation for weakness of right-sided extremities. EXAM: MRI HEAD WITHOUT AND WITH CONTRAST TECHNIQUE: Multiplanar, multiecho pulse sequences of the brain and surrounding structures were obtained without and with intravenous contrast. CONTRAST:  69mL MULTIHANCE GADOBENATE DIMEGLUMINE 529 MG/ML IV SOLN COMPARISON:  Prior CT from earlier the same day as well as previous MRI from 01/30/2014. FINDINGS: Brain: Cerebral volume within normal limits. Mild chronic microvascular ischemic changes present within the periventricular white matter. There are 2 metastatic lesions. One positioned within the posterior left centrum semi ovale measures 11 x 13 x 15 mm (series 11, image 37). Surrounding vasogenic edema without midline shift. Small amount of chronic blood products/ necrosis on gradient echo sequence. Second metastatic lesion positioned within the subcortical white matter of the anterior right frontal lobe and measures 10 x 9 x 11 mm (series 11, image 33). Surrounding vasogenic edema without significant mass effect. No associated hemorrhage. No other mass lesion or abnormal enhancement identified. No evidence for acute infarct. Gray-white matter differentiation otherwise maintained. No areas of chronic  infarction. No midline shift. No hydrocephalus. Mild mass effect on the left lateral ventricle related to the left cerebral metastatic lesion. No extra-axial fluid collection. Major dural sinuses are grossly patent. Pituitary gland within normal limits. Vascular: Major intracranial vascular flow voids are maintained. Skull and upper cervical spine: Craniocervical junction normal. Visualized upper cervical spine unremarkable. Bone marrow signal intensity normal. No scalp soft tissue abnormality. Sinuses/Orbits: Globes and orbits within normal limits. Paranasal sinuses are clear. Trace opacity bilateral mastoid air cells. Inner ear structures grossly normal. Other: No other significant finding. IMPRESSION: Two metastatic lesions involving the anterior right frontal lobe and left centrum semi ovale as above. Associated vasogenic edema without midline shift. Electronically Signed   By: Jeannine Boga M.D.   On: 03/26/2016 01:07   Mr Lumbar Spine Wo Contrast  Result Date: 04/22/2016 CLINICAL DATA:  Metastatic renal cell cancer. Lower extremity weakness and difficulty walking. EXAM: MRI LUMBAR SPINE WITHOUT CONTRAST TECHNIQUE: Multiplanar, multisequence MR imaging of the lumbar spine was performed. No intravenous contrast was administered. COMPARISON:  CT same day FINDINGS: The patient became claustrophobic and terminated the examination after T sagittal sequences. Segmentation:  5 lumbar type vertebral bodies. Alignment:  Minimal curvature convex to the left. Vertebrae: Fatty marrow suggesting previous radiation. No marrow space lesion. No fracture. Conus medullaris: Extends to the L1-2 level and  appears normal. The distal cord and conus region appear normal. Paraspinal and other soft tissues: No axial images for evaluation. Disc levels: Mild non-compressive bulging of the L2-3 disc. The other levels are negative. There is no central canal stenosis. No foraminal stenosis. No edematous facet arthropathy.  IMPRESSION: Study terminated because of severe patient claustrophobia. We see the region from the inferior endplate of 624THL through S2, in there is no evidence of metastatic disease to this region. There is no spinal stenosis. The distal cord and nerve roots appear normal. The study does not show an explanation for the clinical presentation. Electronically Signed   By: Nelson Chimes M.D.   On: 04/22/2016 18:01   Ct Head Code Stroke W/o Cm  Result Date: 03/25/2016 CLINICAL DATA:  Code stroke. RIGHT-sided weakness and confusion, last seen normal at 1600 hours. History of metastatic renal cell carcinoma, atrial fibrillation. EXAM: CT HEAD WITHOUT CONTRAST TECHNIQUE: Contiguous axial images were obtained from the base of the skull through the vertex without intravenous contrast. COMPARISON:  CT HEAD January 01, 2016 FINDINGS: BRAIN: New LEFT centrum semiovale 10 mm rounded density with extensive surrounding hypodense vasogenic edema. New RIGHT frontal lobe subcortical 7 mm dense lesion with vasogenic edema. Local mass effect without midline shift. Moderate ventriculomegaly on the basis of global parenchymal brain volume loss, stable from prior imaging. No acute large vascular territory infarcts. No abnormal extra-axial fluid collections. Basal cisterns are patent . VASCULAR: Moderate calcific atherosclerosis of the carotid siphons. SKULL: No skull fracture. Multiple sub cm lytic lesions in the calvarium. No significant scalp soft tissue swelling. SINUSES/ORBITS: The mastoid air-cells and included paranasal sinuses are well-aerated.The included ocular globes and orbital contents are non-suspicious. OTHER: None. ASPECTS [facility] Stroke Program Early CT Score) - Ganglionic level infarction (caudate, lentiform nuclei, internal capsule, insula, M1-M3 cortex): 7 - Supraganglionic infarction (M4-M6 cortex): 3 Total score (0-10 with 10 being normal): 10 IMPRESSION: 1. Two new sub cm frontal lobe metastasis (hemorrhagic versus  small round blue cell type tumor) with extensive vasogenic edema. No acute large vascular territory infarct. Sub cm lytic lesions in the calvarium, suspicious for osseous metastasis. 2. ASPECTS is 10. Acute findings discussed with and reconfirmed by Dr.Dr. Nicole Kindred, Neurology On 03/25/2016 at 8:20 pm. Electronically Signed   By: Elon Alas M.D.   On: 03/25/2016 20:24     Subjective: Pt without pain, including chest pain. No dyspnea or palpitations or LE swelling. Still unable to walk independently and disheartened by this.   Discharge Exam: Vitals:   04/23/16 2105 04/24/16 0531  BP: (!) 128/55 138/84  Pulse: 67 83  Resp: 16 16  Temp: 98.6 F (37 C) 98.2 F (36.8 C)   Vitals:   04/23/16 1953 04/23/16 2105 04/24/16 0531 04/24/16 0929  BP:  (!) 128/55 138/84   Pulse:  67 83   Resp:  16 16   Temp:  98.6 F (37 C) 98.2 F (36.8 C)   TempSrc:  Oral Oral   SpO2: 94% 97% 97% 92%  Weight:      Height:       General: Obese, pleasant female not in acute distress. Hirsutism evident. Cardiovascular: Irreg irreg, S1/S2 +, no rubs, no gallops Respiratory: CTA bilaterally, no wheezing, no rhonchi Abdominal: Soft, NT, ND, bowel sounds + Extremities: no edema, no cyanosis  The results of significant diagnostics from this hospitalization (including imaging, microbiology, ancillary and laboratory) are listed below for reference.    Microbiology: No results found for this or any previous  visit (from the past 240 hour(s)).   Labs: BNP (last 3 results) No results for input(s): BNP in the last 8760 hours. Basic Metabolic Panel:  Recent Labs Lab 04/22/16 1313 04/23/16 0519 04/24/16 0553  NA 133* 139 136  K 4.0 4.7 4.4  CL 96* 105 105  CO2 25 28 23   GLUCOSE 466* 267* 236*  BUN 28* 32* 35*  CREATININE 0.86 0.76 0.72  CALCIUM 8.9 8.8* 8.8*  MG 2.2  --  2.1  PHOS 3.1  --   --    Liver Function Tests:  Recent Labs Lab 04/22/16 1313 04/23/16 0519  AST 21 16  ALT 71* 56*   ALKPHOS 102 83  BILITOT 1.0 1.1  PROT 6.3* 5.7*  ALBUMIN 3.2* 2.7*   No results for input(s): LIPASE, AMYLASE in the last 168 hours. No results for input(s): AMMONIA in the last 168 hours. CBC:  Recent Labs Lab 04/22/16 1313 04/24/16 0553  WBC 9.5 9.0  NEUTROABS 8.7*  --   HGB 13.5 12.4  HCT 39.5 37.4  MCV 87.0 89.5  PLT 152 164   Cardiac Enzymes: No results for input(s): CKTOTAL, CKMB, CKMBINDEX, TROPONINI in the last 168 hours. BNP: Invalid input(s): POCBNP CBG:  Recent Labs Lab 04/23/16 0812 04/23/16 1222 04/23/16 1802 04/23/16 2111 04/24/16 0745  GLUCAP 300* 332* 155* 277* 250*   D-Dimer No results for input(s): DDIMER in the last 72 hours. Hgb A1c  Recent Labs  04/22/16 1313  HGBA1C 8.1*   Lipid Profile No results for input(s): CHOL, HDL, LDLCALC, TRIG, CHOLHDL, LDLDIRECT in the last 72 hours. Thyroid function studies No results for input(s): TSH, T4TOTAL, T3FREE, THYROIDAB in the last 72 hours.  Invalid input(s): FREET3 Anemia work up No results for input(s): VITAMINB12, FOLATE, FERRITIN, TIBC, IRON, RETICCTPCT in the last 72 hours. Urinalysis    Component Value Date/Time   COLORURINE YELLOW 04/22/2016 [place] 04/22/2016 1645   LABSPEC 1.040 (H) 04/22/2016 1645   PHURINE 6.5 04/22/2016 1645   GLUCOSEU >1000 (A) 04/22/2016 1645   HGBUR NEGATIVE 04/22/2016 1645   BILIRUBINUR NEGATIVE 04/22/2016 1645   KETONESUR 15 (A) 04/22/2016 1645   PROTEINUR 30 (A) 04/22/2016 1645   UROBILINOGEN 0.2 03/22/2014 1713   NITRITE NEGATIVE 04/22/2016 1645   LEUKOCYTESUR NEGATIVE 04/22/2016 1645   Sepsis Labs Invalid input(s): PROCALCITONIN,  WBC,  LACTICIDVEN Microbiology No results found for this or any previous visit (from the past 240 hour(s)).  Time coordinating discharge: Over 30 minutes  Vance Gather, MD  [facility] 04/24/2016, 11:55 AM Pager [phone]  If 7PM-7AM, please contact  night-coverage [email or web address] Password [email or web address]

## 2016-04-24 NOTE — Care Management Note (Signed)
Case Management Note  Patient Details  Name: Allison [name] MRN: [id number] Date of Birth: 1951-04-08  Subjective/Objective:65 y/o f admitted w/Brain tumor. From home. PT-recc SNF. CSW already following for SNF.                    Action/Plan:d/c SNF.   Expected Discharge Date:   (unknown)               Expected Discharge Plan:  Skilled Nursing Facility  In-House Referral:  Clinical Social Work  Discharge planning Services  CM Consult  Post Acute Care Choice:    Choice offered to:     DME Arranged:    DME Agency:     HH Arranged:    [place] Agency:     Status of Service:  Completed, signed off  If discussed at [employer], dates discussed:    Additional Comments:  Dessa Phi, RN 04/24/2016, 10:34 AM

## 2016-04-24 NOTE — Clinical Social Work Placement (Signed)
   CLINICAL SOCIAL WORK PLACEMENT  NOTE  Date:  04/24/2016  Patient Details  Name: Allison [name] MRN: [id number] Date of Birth: July 25, 1950  Clinical Social Work is seeking post-discharge placement for this patient at the [place] level of care (*CSW will initial, date and re-position this form in  chart as items are completed):  Yes   Patient/family provided with [facility] Work Department's list of facilities offering this level of care within the geographic area requested by the patient (or if unable, by the patient's family).  Yes   Patient/family informed of their freedom to choose among providers that offer the needed level of care, that participate in Medicare, Medicaid or managed care program needed by the patient, have an available bed and are willing to accept the patient.  Yes   Patient/family informed of [facility]'s ownership interest in [facility] and [facility], as well as of the fact that they are under no obligation to receive care at these facilities.  PASRR submitted to EDS on 04/23/16     PASRR number received on 04/23/16     Existing PASRR number confirmed on       FL2 transmitted to all facilities in geographic area requested by pt/family on 04/23/16     FL2 transmitted to all facilities within larger geographic area on       Patient informed that his/her managed care company has contracts with or will negotiate with certain facilities, including the following:        Yes   Patient/family informed of bed offers received.  Patient chooses bed at [facility]     Physician recommends and patient chooses bed at      Patient to be transferred to [facility] on 04/24/16.  Patient to be transferred to facility by [facility]     Patient family notified on 04/24/16 of transfer.  Name of family member notified:  Pt contacted spouse directly.     PHYSICIAN       Additional Comment: Pt / spouse are  in agreement with d/c today to [facility]. [facility] transport required. Pt is aware out of pocket costs may be associated with [facility] transport. Medical necessity form completed. D/C Summary sent to SNF for review. No scripts printed. # for report provided to nsg.   _______________________________________________ Luretha Rued, LCSW  [phone] 04/24/2016, 1:25 PM

## 2016-04-25 ENCOUNTER — Encounter: Payer: Self-pay | Admitting: Radiation Therapy

## 2016-04-25 NOTE — Progress Notes (Signed)
   Allison [name] is staying at the [facility] since her hospital discharge. She was discharged with a steroid dose of, 4 mg qid. [name] has  asked that we begin another taper early next week. The instructions are below. [name], will you please call this facility to share these instructions to her nurse?   Please and Thank you!!  Early next week change her 4mg  qid to 4mg  tid. Do this for 1 week. Then 4mg  bid x 2 weeks Then 2mg  bid x 2 weeks.   [name] is scheduled to see Dr. Tammi Klippel for follow-up in our clinic on 11/20. He can reassess her at that time to complete taper.   Thank you so much!!  Mont Dutton R.T.(R)(T) Special Procedures Navigator

## 2016-04-28 ENCOUNTER — Inpatient Hospital Stay (HOSPITAL_COMMUNITY)
Admission: EM | Admit: 2016-04-28 | Discharge: 2016-04-29 | DRG: 193 | Disposition: A | Discharge status: Other Acute Inpatient Hospital | Payer: Medicaid Other | Attending: Internal Medicine | Admitting: Internal Medicine

## 2016-04-28 ENCOUNTER — Encounter (HOSPITAL_COMMUNITY): Payer: Self-pay | Admitting: Emergency Medicine

## 2016-04-28 ENCOUNTER — Emergency Department (HOSPITAL_COMMUNITY): Payer: Medicaid Other

## 2016-04-28 DIAGNOSIS — Z87891 Personal history of nicotine dependence: Secondary | ICD-10-CM | POA: Diagnosis not present

## 2016-04-28 DIAGNOSIS — R310 Gross hematuria: Secondary | ICD-10-CM | POA: Diagnosis present

## 2016-04-28 DIAGNOSIS — I252 Old myocardial infarction: Secondary | ICD-10-CM | POA: Diagnosis not present

## 2016-04-28 DIAGNOSIS — I4891 Unspecified atrial fibrillation: Secondary | ICD-10-CM | POA: Diagnosis present

## 2016-04-28 DIAGNOSIS — M21371 Foot drop, right foot: Secondary | ICD-10-CM | POA: Diagnosis present

## 2016-04-28 DIAGNOSIS — I482 Chronic atrial fibrillation: Secondary | ICD-10-CM | POA: Diagnosis present

## 2016-04-28 DIAGNOSIS — J9601 Acute respiratory failure with hypoxia: Secondary | ICD-10-CM | POA: Diagnosis present

## 2016-04-28 DIAGNOSIS — C649 Malignant neoplasm of unspecified kidney, except renal pelvis: Secondary | ICD-10-CM | POA: Diagnosis present

## 2016-04-28 DIAGNOSIS — Z794 Long term (current) use of insulin: Secondary | ICD-10-CM

## 2016-04-28 DIAGNOSIS — R0602 Shortness of breath: Secondary | ICD-10-CM

## 2016-04-28 DIAGNOSIS — J9621 Acute and chronic respiratory failure with hypoxia: Secondary | ICD-10-CM | POA: Diagnosis present

## 2016-04-28 DIAGNOSIS — R29898 Other symptoms and signs involving the musculoskeletal system: Secondary | ICD-10-CM | POA: Diagnosis present

## 2016-04-28 DIAGNOSIS — Z515 Encounter for palliative care: Secondary | ICD-10-CM | POA: Diagnosis not present

## 2016-04-28 DIAGNOSIS — Z79899 Other long term (current) drug therapy: Secondary | ICD-10-CM | POA: Diagnosis not present

## 2016-04-28 DIAGNOSIS — J9622 Acute and chronic respiratory failure with hypercapnia: Secondary | ICD-10-CM | POA: Diagnosis present

## 2016-04-28 DIAGNOSIS — J9620 Acute and chronic respiratory failure, unspecified whether with hypoxia or hypercapnia: Secondary | ICD-10-CM | POA: Diagnosis present

## 2016-04-28 DIAGNOSIS — E099 Drug or chemical induced diabetes mellitus without complications: Secondary | ICD-10-CM | POA: Diagnosis present

## 2016-04-28 DIAGNOSIS — J440 Chronic obstructive pulmonary disease with acute lower respiratory infection: Secondary | ICD-10-CM | POA: Diagnosis present

## 2016-04-28 DIAGNOSIS — I739 Peripheral vascular disease, unspecified: Secondary | ICD-10-CM | POA: Diagnosis present

## 2016-04-28 DIAGNOSIS — Z7189 Other specified counseling: Secondary | ICD-10-CM

## 2016-04-28 DIAGNOSIS — Z7901 Long term (current) use of anticoagulants: Secondary | ICD-10-CM | POA: Diagnosis not present

## 2016-04-28 DIAGNOSIS — I5043 Acute on chronic combined systolic (congestive) and diastolic (congestive) heart failure: Secondary | ICD-10-CM | POA: Diagnosis present

## 2016-04-28 DIAGNOSIS — T380X5A Adverse effect of glucocorticoids and synthetic analogues, initial encounter: Secondary | ICD-10-CM | POA: Diagnosis present

## 2016-04-28 DIAGNOSIS — C7800 Secondary malignant neoplasm of unspecified lung: Secondary | ICD-10-CM | POA: Diagnosis present

## 2016-04-28 DIAGNOSIS — C7931 Secondary malignant neoplasm of brain: Secondary | ICD-10-CM | POA: Diagnosis present

## 2016-04-28 DIAGNOSIS — J449 Chronic obstructive pulmonary disease, unspecified: Secondary | ICD-10-CM | POA: Diagnosis present

## 2016-04-28 DIAGNOSIS — J189 Pneumonia, unspecified organism: Secondary | ICD-10-CM | POA: Diagnosis present

## 2016-04-28 DIAGNOSIS — I110 Hypertensive heart disease with heart failure: Secondary | ICD-10-CM | POA: Diagnosis present

## 2016-04-28 DIAGNOSIS — I255 Ischemic cardiomyopathy: Secondary | ICD-10-CM | POA: Diagnosis present

## 2016-04-28 DIAGNOSIS — E785 Hyperlipidemia, unspecified: Secondary | ICD-10-CM | POA: Diagnosis present

## 2016-04-28 DIAGNOSIS — R918 Other nonspecific abnormal finding of lung field: Secondary | ICD-10-CM | POA: Diagnosis present

## 2016-04-28 DIAGNOSIS — I2510 Atherosclerotic heart disease of native coronary artery without angina pectoris: Secondary | ICD-10-CM | POA: Diagnosis present

## 2016-04-28 DIAGNOSIS — Z955 Presence of coronary angioplasty implant and graft: Secondary | ICD-10-CM | POA: Diagnosis not present

## 2016-04-28 DIAGNOSIS — Z7952 Long term (current) use of systemic steroids: Secondary | ICD-10-CM | POA: Diagnosis not present

## 2016-04-28 DIAGNOSIS — J9690 Respiratory failure, unspecified, unspecified whether with hypoxia or hypercapnia: Secondary | ICD-10-CM | POA: Diagnosis not present

## 2016-04-28 DIAGNOSIS — R531 Weakness: Secondary | ICD-10-CM | POA: Diagnosis present

## 2016-04-28 LAB — URINALYSIS, ROUTINE W REFLEX MICROSCOPIC
Bilirubin Urine: NEGATIVE
GLUCOSE, UA: 500 mg/dL — AB
LEUKOCYTES UA: NEGATIVE
NITRITE: NEGATIVE
PH: 6.5 (ref 5.0–8.0)
Protein, ur: 100 mg/dL — AB
SPECIFIC GRAVITY, URINE: 1.02 (ref 1.005–1.030)

## 2016-04-28 LAB — BLOOD GAS, ARTERIAL
Acid-Base Excess: 1.5 mmol/L (ref 0.0–2.0)
Bicarbonate: 26.2 mmol/L (ref 20.0–28.0)
DRAWN BY: 270161
O2 CONTENT: 4 L/min
O2 SAT: 92.3 %
PATIENT TEMPERATURE: 97.9
PO2 ART: 63 mmHg — AB (ref 83.0–108.0)
pCO2 arterial: 32.6 mmHg (ref 32.0–48.0)
pH, Arterial: 7.49 — ABNORMAL HIGH (ref 7.350–7.450)

## 2016-04-28 LAB — CBC
HEMATOCRIT: 40 % (ref 36.0–46.0)
HEMOGLOBIN: 13.6 g/dL (ref 12.0–15.0)
MCH: 30.4 pg (ref 26.0–34.0)
MCHC: 34 g/dL (ref 30.0–36.0)
MCV: 89.5 fL (ref 78.0–100.0)
Platelets: 248 10*3/uL (ref 150–400)
RBC: 4.47 MIL/uL (ref 3.87–5.11)
RDW: 16.6 % — ABNORMAL HIGH (ref 11.5–15.5)
WBC: 13.6 10*3/uL — AB (ref 4.0–10.5)

## 2016-04-28 LAB — URINE MICROSCOPIC-ADD ON: WBC, UA: NONE SEEN WBC/hpf (ref 0–5)

## 2016-04-28 LAB — BASIC METABOLIC PANEL
ANION GAP: 8 (ref 5–15)
BUN: 20 mg/dL (ref 6–20)
CHLORIDE: 101 mmol/L (ref 101–111)
CO2: 25 mmol/L (ref 22–32)
Calcium: 9.2 mg/dL (ref 8.9–10.3)
Creatinine, Ser: 0.74 mg/dL (ref 0.44–1.00)
GFR calc Af Amer: >60 mL/min (ref >60)
Glucose, Bld: 281 mg/dL — ABNORMAL HIGH (ref 65–99)
POTASSIUM: 4.5 mmol/L (ref 3.5–5.1)
SODIUM: 134 mmol/L — AB (ref 135–145)

## 2016-04-28 LAB — CBG MONITORING, ED: Glucose-Capillary: 274 mg/dL — ABNORMAL HIGH (ref 65–99)

## 2016-04-28 LAB — I-STAT CG4 LACTIC ACID, ED: Lactic Acid, Venous: 1.68 mmol/L (ref 0.5–1.9)

## 2016-04-28 MED ORDER — POLYETHYLENE GLYCOL 3350 17 G PO PACK
17.0000 g | PACK | Freq: Two times a day (BID) | ORAL | Status: DC
  Administered 2016-04-28: 17 g via ORAL
  Filled 2016-04-28: qty 1

## 2016-04-28 MED ORDER — VANCOMYCIN HCL IN DEXTROSE 1-5 GM/200ML-% IV SOLN
1000.0000 mg | Freq: Once | INTRAVENOUS | Status: AC
  Administered 2016-04-28: 1000 mg via INTRAVENOUS
  Filled 2016-04-28: qty 200

## 2016-04-28 MED ORDER — FUROSEMIDE 10 MG/ML IJ SOLN
40.0000 mg | Freq: Once | INTRAMUSCULAR | Status: AC
  Administered 2016-04-28: 40 mg via INTRAVENOUS
  Filled 2016-04-28: qty 4

## 2016-04-28 MED ORDER — ONDANSETRON HCL 4 MG/2ML IJ SOLN: 4.0000 mg | Freq: Four times a day (QID) | INTRAMUSCULAR | Status: DC | PRN

## 2016-04-28 MED ORDER — DEXTROSE 5 % IV SOLN
INTRAVENOUS | Status: AC
  Filled 2016-04-28 (×2): qty 1

## 2016-04-28 MED ORDER — ATORVASTATIN CALCIUM 10 MG PO TABS
10.0000 mg | ORAL_TABLET | Freq: Every day | ORAL | Status: DC
  Administered 2016-04-28: 10 mg via ORAL
  Filled 2016-04-28: qty 1

## 2016-04-28 MED ORDER — DEXTROSE 5 % IV SOLN
2.0000 g | Freq: Once | INTRAVENOUS | Status: AC
  Administered 2016-04-28: 2 g via INTRAVENOUS
  Filled 2016-04-28: qty 2

## 2016-04-28 MED ORDER — APIXABAN 5 MG PO TABS
ORAL_TABLET | ORAL | Status: AC
  Filled 2016-04-28: qty 1

## 2016-04-28 MED ORDER — SENNOSIDES-DOCUSATE SODIUM 8.6-50 MG PO TABS
1.0000 | ORAL_TABLET | Freq: Two times a day (BID) | ORAL | Status: DC
  Administered 2016-04-28: 1 via ORAL
  Filled 2016-04-28: qty 1

## 2016-04-28 MED ORDER — METOPROLOL TARTRATE 50 MG PO TABS
100.0000 mg | ORAL_TABLET | Freq: Two times a day (BID) | ORAL | Status: DC
  Administered 2016-04-28: 100 mg via ORAL
  Filled 2016-04-28: qty 2

## 2016-04-28 MED ORDER — ACETAMINOPHEN 325 MG PO TABS: 650.0000 mg | ORAL_TABLET | Freq: Four times a day (QID) | ORAL | Status: DC | PRN

## 2016-04-28 MED ORDER — INSULIN GLARGINE 100 UNIT/ML ~~LOC~~ SOLN
15.0000 [IU] | Freq: Every day | SUBCUTANEOUS | Status: DC
  Administered 2016-04-28: 15 [IU] via SUBCUTANEOUS
  Filled 2016-04-28 (×2): qty 0.15

## 2016-04-28 MED ORDER — DIPHENHYDRAMINE-APAP (SLEEP) 25-500 MG PO TABS: 2.0000 | ORAL_TABLET | Freq: Every day | ORAL | Status: DC | PRN

## 2016-04-28 MED ORDER — APIXABAN 5 MG PO TABS
5.0000 mg | ORAL_TABLET | Freq: Two times a day (BID) | ORAL | Status: DC
  Administered 2016-04-28: 5 mg via ORAL
  Filled 2016-04-28 (×6): qty 1

## 2016-04-28 MED ORDER — POTASSIUM CHLORIDE CRYS ER 10 MEQ PO TBCR
10.0000 meq | EXTENDED_RELEASE_TABLET | Freq: Every day | ORAL | Status: DC
  Administered 2016-04-28: 10 meq via ORAL
  Filled 2016-04-28: qty 1

## 2016-04-28 MED ORDER — SODIUM CHLORIDE 0.9 % IV SOLN
INTRAVENOUS | Status: AC
  Administered 2016-04-28: 12:00:00 via INTRAVENOUS

## 2016-04-28 MED ORDER — LORAZEPAM 2 MG/ML IJ SOLN
0.5000 mg | Freq: Once | INTRAMUSCULAR | Status: AC
  Administered 2016-04-28: 0.5 mg via INTRAVENOUS
  Filled 2016-04-28: qty 1

## 2016-04-28 MED ORDER — GUAIFENESIN ER 600 MG PO TB12
600.0000 mg | ORAL_TABLET | Freq: Two times a day (BID) | ORAL | Status: DC
  Administered 2016-04-28: 600 mg via ORAL
  Filled 2016-04-28: qty 1

## 2016-04-28 MED ORDER — ALBUTEROL SULFATE (2.5 MG/3ML) 0.083% IN NEBU
3.0000 mL | INHALATION_SOLUTION | Freq: Four times a day (QID) | RESPIRATORY_TRACT | Status: DC | PRN
  Administered 2016-04-28: 3 mL via RESPIRATORY_TRACT
  Filled 2016-04-28: qty 3

## 2016-04-28 MED ORDER — LORAZEPAM 0.5 MG PO TABS
0.5000 mg | ORAL_TABLET | Freq: Every day | ORAL | Status: DC
  Administered 2016-04-28: 0.5 mg via ORAL
  Filled 2016-04-28: qty 1

## 2016-04-28 MED ORDER — HYDROCODONE-ACETAMINOPHEN 5-325 MG PO TABS
1.0000 | ORAL_TABLET | Freq: Four times a day (QID) | ORAL | Status: DC | PRN
Start: 2016-04-28 — End: 2016-04-29
  Administered 2016-04-28: 1 via ORAL

## 2016-04-28 MED ORDER — METOPROLOL TARTRATE 5 MG/5ML IV SOLN
5.0000 mg | INTRAVENOUS | Status: DC | PRN
Start: 2016-04-28 — End: 2016-04-29
  Administered 2016-04-28 – 2016-04-29 (×4): 5 mg via INTRAVENOUS
  Filled 2016-04-28 (×4): qty 5

## 2016-04-28 MED ORDER — INSULIN ASPART 100 UNIT/ML ~~LOC~~ SOLN
0.0000 [IU] | Freq: Three times a day (TID) | SUBCUTANEOUS | Status: DC
  Administered 2016-04-29: 15 [IU] via SUBCUTANEOUS

## 2016-04-28 MED ORDER — DILTIAZEM HCL ER COATED BEADS 180 MG PO CP24
180.0000 mg | ORAL_CAPSULE | Freq: Every day | ORAL | Status: DC
  Administered 2016-04-28: 180 mg via ORAL
  Filled 2016-04-28: qty 1

## 2016-04-28 MED ORDER — HYDROCODONE-ACETAMINOPHEN 5-325 MG PO TABS
ORAL_TABLET | ORAL | Status: AC
Start: 2016-04-28 — End: 2016-04-29
  Filled 2016-04-28: qty 1

## 2016-04-28 MED ORDER — DEXTROSE 5 % IV SOLN
1.0000 g | Freq: Three times a day (TID) | INTRAVENOUS | Status: DC
  Administered 2016-04-28 – 2016-04-29 (×3): 1 g via INTRAVENOUS
  Filled 2016-04-28 (×6): qty 1

## 2016-04-28 MED ORDER — FUROSEMIDE 20 MG PO TABS: 20.0000 mg | ORAL_TABLET | Freq: Every morning | ORAL | Status: DC

## 2016-04-28 MED ORDER — DEXAMETHASONE SODIUM PHOSPHATE 10 MG/ML IJ SOLN
INTRAMUSCULAR | Status: AC
Start: 2016-04-28 — End: 2016-04-28
  Filled 2016-04-28: qty 1

## 2016-04-28 MED ORDER — INSULIN ASPART 100 UNIT/ML ~~LOC~~ SOLN: 0.0000 [IU] | Freq: Every day | SUBCUTANEOUS | Status: DC

## 2016-04-28 MED ORDER — ENSURE ENLIVE PO LIQD
237.0000 mL | Freq: Two times a day (BID) | ORAL | Status: DC
  Administered 2016-04-28: 237 mL via ORAL

## 2016-04-28 MED ORDER — ESCITALOPRAM OXALATE 10 MG PO TABS
20.0000 mg | ORAL_TABLET | Freq: Every day | ORAL | Status: DC
  Administered 2016-04-28: 20 mg via ORAL
  Filled 2016-04-28: qty 2

## 2016-04-28 MED ORDER — FLUTICASONE FUROATE 100 MCG/ACT IN AEPB: 1.0000 | INHALATION_SPRAY | RESPIRATORY_TRACT | Status: DC

## 2016-04-28 MED ORDER — DIPHENHYDRAMINE HCL 25 MG PO CAPS: 25.0000 mg | ORAL_CAPSULE | Freq: Every evening | ORAL | Status: DC | PRN

## 2016-04-28 MED ORDER — DEXAMETHASONE SODIUM PHOSPHATE 10 MG/ML IJ SOLN
10.0000 mg | Freq: Two times a day (BID) | INTRAMUSCULAR | Status: DC
  Administered 2016-04-28 – 2016-04-29 (×2): 10 mg via INTRAVENOUS
  Filled 2016-04-28 (×4): qty 1

## 2016-04-28 MED ORDER — VANCOMYCIN HCL IN DEXTROSE 1-5 GM/200ML-% IV SOLN
1000.0000 mg | Freq: Two times a day (BID) | INTRAVENOUS | Status: DC
  Administered 2016-04-28 – 2016-04-29 (×2): 1000 mg via INTRAVENOUS
  Filled 2016-04-28 (×2): qty 200

## 2016-04-28 MED ORDER — BUDESONIDE 0.25 MG/2ML IN SUSP
0.2500 mg | Freq: Two times a day (BID) | RESPIRATORY_TRACT | Status: DC
  Administered 2016-04-28 – 2016-04-29 (×2): 0.25 mg via RESPIRATORY_TRACT
  Filled 2016-04-28 (×2): qty 2

## 2016-04-28 NOTE — Progress Notes (Signed)
RN called saying patient was acting more SOB and 02 sats were only 91% on 5LNC. Patient has coarse crackles and acts like she is having a hard time breathing. Albuterol nebulizer given, and patient placed on 10 L HFNC. RT will continue to monitor.

## 2016-04-28 NOTE — Plan of Care (Signed)
Allison [name] is resting quietly in bed. She is asleep and only opens her eyes when I touch her. She denies pain. She tells me that her husband is her medical decision maker. She is unable to participate in a palliative care discussion at this time. Call to husband, Allison [name]. We plan a family meeting for 10/24 at 46 AM.  Son Allison [name] will likely also attend.

## 2016-04-28 NOTE — ED Notes (Signed)
hosptialist in with pt

## 2016-04-28 NOTE — H&P (Signed)
TRH H&P   Patient Demographics:    Allison [name], is a 65 y.o. female  MRN: [id number]   DOB - 1951-03-04  Admit Date - 04/28/2016  Outpatient Primary MD for the patient is [facility]  Outpatient Specialists: Dr Zachery Conch    Patient coming from: SNF  Chief Complaint  Patient presents with  . Weakness      HPI:    Allison [name]  is a 65 y.o. 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 Arlyss Gandy and [facility], [place] cardiologist Dr. 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  Patient was recently admitted to [facility] for deconditioning and weakness currently living at [facility] home comes in with 2 day history of shortness of breath and a productive cough, denies any fever chills, no headache or chest pain, no new focal weakness, no abdominal pain, no diarrhea, no blood in stool or urine, in the ER workup suggestive of acute on chronic hypoxic respiratory failure due to combination of multilobar pneumonia along with lung metastases and COPD. I was called to admit the patient. She was also found to be in RVR in the ER.    Review of systems:    In addition to the HPI above,   No Fever-chills, No Headache, No changes with Vision or hearing, No problems swallowing food or Liquids, No Chest  pain, Positive cough and Shortness of Breath, No Abdominal pain, No Nausea or Vommitting, Bowel movements are regular, No Blood in stool or Urine, No dysuria, No new skin rashes or bruises, No new joints pains-aches,  No new weakness, tingling, numbness in any extremity, No recent weight gain or loss, No polyuria, polydypsia or polyphagia, No significant Mental Stressors.  A full 10 point Review of Systems was done, except as stated above, all other Review of Systems were negative.   With Past History of the following :    Past Medical History:  Diagnosis Date  .  A-fib ([place])    a. Dx 01/2014 - rate controlled. b. Recurrence 03/2014 in setting of renal cell carcinoma surgery.  . Acute pyelonephritis 01/28/2014  . Anxiety   . Carotid disease, bilateral ([place])    a. 11/2011 U/S: RICA 123XX123, LICA XX123456.  Marland Kitchen COPD (chronic obstructive pulmonary disease) ([place])    uses O2 at night  . Coronary artery disease    a. 08/2009 NSTEMI s/p PCI/DES to LCX. Mod residual LAD dzs;  b. 02/2014 Nonischemic myoview.  Marland Kitchen Dysrhythmia    A-Fib  . History of kidney stones   . Hyperlipidemia   . Hypertension   . Ischemic cardiomyopathy    a. Echo 08/14/09 with LVEF 40-45%;  b. 01/2014 Echo: EF 55-60%, basal-mid inflat and inf HK, triv MR/TR, mod dil LA/RA.  Marland Kitchen Lung nodule    a. 1cm RLL nodule - followed by pulmonology.  . Metastatic renal cell carcinoma to lung ([place]) 01/29/2014  . Pneumonia    hx of  . PVD (peripheral vascular disease) ([place])    a. 11/2010 L Fem to above knee Pop bypass;  b. 12/2011 Angio: patent L fem->pop graft, LCFZ 50-60%.  . Shortness of breath dyspnea    with exertion  . 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 Tobacco abuse    hx of      Past Surgical History:  Procedure Laterality Date  . CARDIAC CATHETERIZATION    . CARDIAC CATHETERIZATION N/A 03/07/2016   Procedure: Left Heart Cath and Coronary Angiography;  Surgeon: Burnell Blanks, MD;  Location: [facility];   Service: Cardiovascular;  Laterality: N/A;  . CARDIOVERSION N/A 05/08/2014   Procedure: CARDIOVERSION;  Surgeon: Thayer Headings, MD;  Location: [facility];  Service: Cardiovascular;  Laterality: N/A;  . CORONARY STENT PLACEMENT  2011  . FEMORAL BYPASS Left May 2012  . INCISIONAL HERNIA REPAIR N/A 10/01/2015   Procedure: INCISIONAL HERNIA REPAIR;  Surgeon: Ralene Ok, MD;  Location: [facility];  Service: General;  Laterality: N/A;  . INSERTION OF MESH N/A 10/01/2015   Procedure: INSERTION OF MESH;  Surgeon: Ralene Ok, MD;  Location: [facility];  Service: General;  Laterality: N/A;  . LOWER EXTREMITY ANGIOGRAM  December 16, 2011  . LOWER EXTREMITY ANGIOGRAM N/A 12/16/2011   Procedure: LOWER EXTREMITY ANGIOGRAM;  Surgeon: Serafina Mitchell, MD;  Location: [facility] CATH LAB;  Service: Cardiovascular;  Laterality: N/A;  . ROBOT ASSISTED LAPAROSCOPIC NEPHRECTOMY Left 03/22/2014   Procedure: ROBOTIC ASSISTED LAPAROSCOPIC RADICAL NEPHRECTOMY LEFT;  Surgeon: Alexis Frock, MD;  Location: [facility];  Service: Urology;  Laterality: Left;  . TONSILLECTOMY  65 years old      Social History:     Social History  Substance Use Topics  . Smoking status: Former Smoker    Packs/day: 1.50    Years: 30.00    Types: Cigarettes    Quit date: 08/12/2009  . Smokeless tobacco: Never Used     Comment: she has smoker 1-2 packs daily since the age of 40. 66 pack years  . Alcohol use No         Family History :     Family History  Problem Relation Age of Onset  . Coronary artery disease Mother     deceased. coronary artery bypass graft  . Cancer Mother     BREAST  . Heart disease Mother     PVD  - Amputation-Leg  . Hyperlipidemia Mother   . Hypertension Mother   . Heart attack Mother   .  Cirrhosis Father     deceased  . Cancer Father   . Other Brother     alive, unknown hx  . Stroke Maternal Aunt        Home Medications:   Prior to Admission medications   Medication Sig Start Date End Date Taking?  Authorizing Provider  albuterol (PROVENTIL HFA;VENTOLIN HFA) 108 (90 Base) MCG/ACT inhaler Inhale 2 puffs into the lungs every 6 (six) hours as needed for wheezing or shortness of breath (shortness of breath). 07/26/15  Yes Tammy S Parrett, NP  apixaban (ELIQUIS) 5 MG TABS tablet Take 1 tablet (5 mg total) by mouth 2 (two) times daily. 01/24/16  Yes Burnell Blanks, MD  atorvastatin (LIPITOR) 10 MG tablet Take 1 tablet (10 mg total) by mouth daily at 6 PM. 04/24/16  Yes Patrecia Pour, MD  dexamethasone (DECADRON) 4 MG tablet Take 1 tablet (4 mg total) by mouth every 8 (eight) hours. 04/24/16  Yes Patrecia Pour, MD  diltiazem (CARDIZEM CD) 180 MG 24 hr capsule Take 1 capsule (180 mg total) by mouth daily. 04/24/16  Yes Patrecia Pour, MD  diphenhydramine-acetaminophen (TYLENOL PM) 25-500 MG TABS Take 2 tablets by mouth daily as needed (for sleep).    Yes Historical Provider, MD  escitalopram (LEXAPRO) 20 MG tablet Take 20 mg by mouth at bedtime.   Yes Historical Provider, MD  Fluticasone Furoate (ARNUITY ELLIPTA) 100 MCG/ACT AEPB Inhale 1 puff into the lungs daily. Patient taking differently: Inhale 1 puff into the lungs every morning.  03/28/16  Yes Brand Males, MD  furosemide (LASIX) 20 MG tablet TAKE 1 TABLET EVERY MORNING. Patient taking differently: TAKE 20 MG BY MOUTH EVERY MORNING 03/04/16  Yes Burnell Blanks, MD  guaiFENesin (MUCINEX) 600 MG 12 hr tablet Take 600 mg by mouth 2 (two) times daily.   Yes Historical Provider, MD  insulin aspart (NOVOLOG) 100 UNIT/ML injection Inject 0-15 Units into the skin 3 (three) times daily with meals. 04/24/16  Yes Patrecia Pour, MD  insulin glargine (LANTUS) 100 UNIT/ML injection Inject 0.15 mLs (15 Units total) into the skin at bedtime. 04/24/16  Yes Patrecia Pour, MD  LORazepam (ATIVAN) 0.5 MG tablet Take 0.5 mg by mouth daily.   Yes Historical Provider, MD  metoprolol (LOPRESSOR) 100 MG tablet Take 1 tablet (100 mg total) by mouth 2 (two) times  daily. 04/24/16  Yes Patrecia Pour, MD  potassium chloride (K-DUR,KLOR-CON) 10 MEQ tablet Take 10 mEq by mouth every morning.    Yes Historical Provider, MD  senna-docusate (SENOKOT-S) 8.6-50 MG tablet Take 1 tablet by mouth 2 (two) times daily. 03/26/16  Yes Wyatt Portela, MD  Tiotropium Bromide-Olodaterol (STIOLTO RESPIMAT) 2.5-2.5 MCG/ACT AERS Inhale 2 puffs into the lungs daily. Patient taking differently: Inhale 2 puffs into the lungs every morning.  03/04/16  Yes Brand Males, MD     Allergies:     Allergies  Allergen Reactions  . Ace Inhibitors Cough  . Amoxicillin-Pot Clavulanate Hives  . Codeine Nausea And Vomiting     Physical Exam:   Vitals  Blood pressure 137/90, pulse 108, temperature 97.9 F (36.6 C), temperature source Oral, resp. rate 25, height 5\' 7"  (1.702 m), weight 82.6 kg (182 lb), SpO2 90 %.   1. General White elderly female lying in bed in mild shortness of breath,  2. Normal affect and insight, Not Suicidal or Homicidal, Awake Alert, Oriented X 3.  3. No F.N deficits, ALL C.Nerves Intact, Strength 5/5  all 4 extremities, Sensation intact all 4 extremities, Plantars down going. Left leg weaker than right, right foot drop,  4. Ears and Eyes appear Normal, Conjunctivae clear, PERRLA. Moist Oral Mucosa.  5. Supple Neck, No JVD, No cervical lymphadenopathy appriciated, No Carotid Bruits.  6. Symmetrical Chest wall movement, Good air movement bilaterally, CTAB.  7. Irregularly is regular rate and rhythm  No Gallops, Rubs or Murmurs, No Parasternal Heave.  8. Positive Bowel Sounds, Abdomen Soft, No tenderness, No organomegaly appriciated,No rebound -guarding or rigidity.  9.  No Cyanosis, Normal Skin Turgor, No Skin Rash or Bruise. There is a right ankle stage I to 2 ulcer under bandage,  10. Good muscle tone,  joints appear normal , no effusions, Normal ROM.  11. No Palpable Lymph Nodes in Neck or Axillae      Data Review:    CBC  Recent  Labs Lab 04/22/16 1313 04/24/16 0553 04/28/16 0924  WBC 9.5 9.0 13.6*  HGB 13.5 12.4 13.6  HCT 39.5 37.4 40.0  PLT 152 164 248  MCV 87.0 89.5 89.5  MCH 29.7 29.7 30.4  MCHC 34.2 33.2 34.0  RDW 15.6* 16.1* 16.6*  LYMPHSABS 0.3*  --   --   MONOABS 0.5  --   --   EOSABS 0.0  --   --   BASOSABS 0.0  --   --    ------------------------------------------------------------------------------------------------------------------  Chemistries   Recent Labs Lab 04/22/16 1313 04/23/16 0519 04/24/16 0553 04/28/16 0924  NA 133* 139 136 134*  K 4.0 4.7 4.4 4.5  CL 96* 105 105 101  CO2 25 28 23 25   GLUCOSE 466* 267* 236* 281*  BUN 28* 32* 35* 20  CREATININE 0.86 0.76 0.72 0.74  CALCIUM 8.9 8.8* 8.8* 9.2  MG 2.2  --  2.1  --   AST 21 16  --   --   ALT 71* 56*  --   --   ALKPHOS 102 83  --   --   BILITOT 1.0 1.1  --   --    ------------------------------------------------------------------------------------------------------------------ estimated creatinine clearance is 79.5 mL/min (by C-G formula based on SCr of 0.74 mg/dL). ------------------------------------------------------------------------------------------------------------------ No results for input(s): TSH, T4TOTAL, T3FREE, THYROIDAB in the last 72 hours.  Invalid input(s): FREET3  Coagulation profile  Recent Labs Lab 04/22/16 1313  INR 1.45   ------------------------------------------------------------------------------------------------------------------- No results for input(s): DDIMER in the last 72 hours. -------------------------------------------------------------------------------------------------------------------  Cardiac Enzymes No results for input(s): CKMB, TROPONINI, MYOGLOBIN in the last 168 hours.  Invalid input(s): CK ------------------------------------------------------------------------------------------------------------------ No results found for:  BNP   ---------------------------------------------------------------------------------------------------------------  Urinalysis    Component Value Date/Time   COLORURINE YELLOW 04/28/2016 0933   APPEARANCEUR CLEAR 04/28/2016 0933   LABSPEC 1.020 04/28/2016 0933   PHURINE 6.5 04/28/2016 0933   GLUCOSEU 500 (A) 04/28/2016 0933   HGBUR MODERATE (A) 04/28/2016 0933   BILIRUBINUR NEGATIVE 04/28/2016 0933   KETONESUR TRACE (A) 04/28/2016 0933   PROTEINUR 100 (A) 04/28/2016 0933   UROBILINOGEN 0.2 03/22/2014 1713   NITRITE NEGATIVE 04/28/2016 0933   LEUKOCYTESUR NEGATIVE 04/28/2016 0933    ----------------------------------------------------------------------------------------------------------------   Imaging Results:    Dg Chest Portable 1 View  Result Date: 04/28/2016 CLINICAL DATA:  Chest congestion. EXAM: PORTABLE CHEST 1 VIEW COMPARISON:  11/27/2015 chest CT FINDINGS: Bilateral nodular airspace disease with confluent opacity at the bases. Patient has known pulmonary metastases but the nodular airspace disease is likely also pneumonia based on 04/22/2016 thoracic spine CT. Interstitial coarsening without suspected edema. No effusion  or pneumothorax. Normal heart size for technique. IMPRESSION: Bilateral pulmonary nodules that are increased from 11/27/2015. Based on recent spinal CT this is likely a combination of known metastases and multi focal pneumonia. Electronically Signed   By: Monte Fantasia M.D.   On: 04/28/2016 09:52    My personal review of EKG: Rhythm A. fib rate 110 bpm , no Acute ST changes   Assessment & Plan:     1. Acute on chronic hypoxic respiratory failure due to multilobar pneumonia in a patient with COPD on 2 L nasal cannula home oxygen with known lung metastases. Will be admitted to telemetry, blood and sputum cultures, IV antibiotics, oxygen and nebulizer treatments.  2. Renal cell cancer with known metastases to lungs and brain. Recent right foot drop a  few months ago due to brain metastases, chronic left lower extremity weakness, under the care of Dr. Alen Blew, Tammi Klippel and [facility], for now she was on Decadron oral which will be switched to IV at a higher dose to assist with COPD and current stress of illness. Thereafter outpatient follow-up with her subspecialists.  3. Chronic A. fib, currently in RVR. Mali vasc 2 score of at least 4. Continue Cardizem, Lopressor, Eliquis, as needed IV Lopressor and telemetry monitor. Will gently hydrate for 10 hours.  4. Chronic combined systolic and diastolic CHF. EF 40%. No rales, no edema, gentle hydration today, resume home dose Lasix from tomorrow, currently appears compensated from CHF standpoint.  5. CAD with ischemic cardiomyopathy. Currently no acute issues, continue home dose beta blocker, statin for secondary prevention. On Eliquis continued.  6. Brain metastases causing left leg weakness, right foot drop, PT, continue steroids. Supportive care.  7. Chronic hematuria due to renal cancer. Outpatient follow-up with her urologist.  8. Dyslipidemia. Continue statin.  9. Steroid-induced DM type II. Check A1c, continue Lantus, sliding scale.    DVT Prophylaxis Eliquis  AM Labs Ordered, also please review Full Orders  Family Communication: Admission, patients condition and plan of care including tests being ordered have been discussed with the patient who indicates understanding and agree with the plan and Code Status.  Code Status Full  Likely DC to  SNF  Condition GUARDED  ++  Consults called: None    Admission status: Inpt    Time spent in minutes : 35   Ibrahem Volkman K M.D on 04/28/2016 at 11:28 AM  Between 7am to 7pm - Pager - [phone]. After 7pm go to [email or web address] - password [email or web address]  [facility] - Office  [phone]

## 2016-04-28 NOTE — Progress Notes (Signed)
[place] for Apixaban Indication: atrial fibrillation  Allergies  Allergen Reactions  . Ace Inhibitors Cough  . Amoxicillin-Pot Clavulanate Hives  . Codeine Nausea And Vomiting   Patient Measurements: Height: 5\' 7"  (170.2 cm) Weight: 182 lb (82.6 kg) IBW/kg (Calculated) : 61.6  Vital Signs: Temp: 97.9 F (36.6 C) (10/23 0918) Temp Source: Oral (10/23 0918) BP: 139/85 (10/23 1130) Pulse Rate: 82 (10/23 1130)  Labs:  Recent Labs  04/28/16 0924  HGB 13.6  HCT 40.0  PLT 248  CREATININE 0.74   Estimated Creatinine Clearance: 79.5 mL/min (by C-G formula based on SCr of 0.74 mg/dL).  Medical History: Past Medical History:  Diagnosis Date  . A-fib ([place])    a. Dx 01/2014 - rate controlled. b. Recurrence 03/2014 in setting of renal cell carcinoma surgery.  . Acute pyelonephritis 01/28/2014  . Anxiety   . Carotid disease, bilateral ([place])    a. 11/2011 U/S: RICA 123XX123, LICA [id number].  Marland Kitchen COPD (chronic obstructive pulmonary disease) ([place])    uses O2 at night  . Coronary artery disease    a. 08/2009 NSTEMI s/p PCI/DES to LCX. Mod residual LAD dzs;  b. 02/2014 Nonischemic myoview.  Marland Kitchen Dysrhythmia    A-Fib  . History of kidney stones   . Hyperlipidemia   . Hypertension   . Ischemic cardiomyopathy    a. Echo 08/14/09 with LVEF 40-45%;  b. 01/2014 Echo: EF 55-60%, basal-mid inflat and inf HK, triv MR/TR, mod dil LA/RA.  Marland Kitchen Lung nodule    a. 1cm RLL nodule - followed by pulmonology.  . Metastatic renal cell carcinoma to lung ([place]) 01/29/2014  . Pneumonia    hx of  . PVD (peripheral vascular disease) ([place])    a. 11/2010 L Fem to above knee Pop bypass;  b. 12/2011 Angio: patent L fem->pop graft, LCFZ 50-60%.  . Shortness of breath dyspnea    with exertion  . 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 Tobacco abuse    hx of   Medications:  Prescriptions Prior to Admission  Medication Sig Dispense Refill Last Dose  . albuterol (PROVENTIL  HFA;VENTOLIN HFA) 108 (90 Base) MCG/ACT inhaler Inhale 2 puffs into the lungs every 6 (six) hours as needed for wheezing or shortness of breath (shortness of breath). 1 Inhaler 3 unknown  . apixaban (ELIQUIS) 5 MG TABS tablet Take 1 tablet (5 mg total) by mouth 2 (two) times daily. 180 tablet 2 04/27/2016 at Unknown time  . atorvastatin (LIPITOR) 10 MG tablet Take 1 tablet (10 mg total) by mouth daily at 6 PM.   04/27/2016 at Unknown time  . dexamethasone (DECADRON) 4 MG tablet Take 1 tablet (4 mg total) by mouth every 8 (eight) hours.   04/27/2016 at Unknown time  . diltiazem (CARDIZEM CD) 180 MG 24 hr capsule Take 1 capsule (180 mg total) by mouth daily.   04/27/2016 at Unknown time  . diphenhydramine-acetaminophen (TYLENOL PM) 25-500 MG TABS Take 2 tablets by mouth daily as needed (for sleep).    04/27/2016 at Unknown time  . escitalopram (LEXAPRO) 20 MG tablet Take 20 mg by mouth at bedtime.   04/27/2016 at Unknown time  . Fluticasone Furoate (ARNUITY ELLIPTA) 100 MCG/ACT AEPB Inhale 1 puff into the lungs daily. (Patient taking differently: Inhale 1 puff into the lungs every morning. ) 30 each 3 04/27/2016 at Unknown time  . furosemide (LASIX) 20 MG tablet TAKE 1 TABLET EVERY MORNING. (Patient taking differently: TAKE 20 MG BY MOUTH EVERY  MORNING) 90 tablet 3 04/27/2016 at Unknown time  . guaiFENesin (MUCINEX) 600 MG 12 hr tablet Take 600 mg by mouth 2 (two) times daily.   04/27/2016 at Unknown time  . insulin aspart (NOVOLOG) 100 UNIT/ML injection Inject 0-15 Units into the skin 3 (three) times daily with meals. 10 mL 11 04/27/2016 at Unknown time  . insulin glargine (LANTUS) 100 UNIT/ML injection Inject 0.15 mLs (15 Units total) into the skin at bedtime. 10 mL 11 04/27/2016 at Unknown time  . LORazepam (ATIVAN) 0.5 MG tablet Take 0.5 mg by mouth daily.   04/27/2016 at Unknown time  . metoprolol (LOPRESSOR) 100 MG tablet Take 1 tablet (100 mg total) by mouth 2 (two) times daily.   04/27/2016 at  Unknown time  . potassium chloride (K-DUR,KLOR-CON) 10 MEQ tablet Take 10 mEq by mouth every morning.    04/27/2016 at Unknown time  . senna-docusate (SENOKOT-S) 8.6-50 MG tablet Take 1 tablet by mouth 2 (two) times daily. 60 tablet 1 04/27/2016 at Unknown time  . Tiotropium Bromide-Olodaterol (STIOLTO RESPIMAT) 2.5-2.5 MCG/ACT AERS Inhale 2 puffs into the lungs daily. (Patient taking differently: Inhale 2 puffs into the lungs every morning. ) 1 Inhaler 3 04/27/2016 at Unknown time   Assessment: Okay for Protocol, continue home therapy.  Goal of Therapy:  Systemic Anticoagulation.   Plan:  Apixaban 5mg  PO BID Monitor for signs and symptoms of bleeding.   Biagio Quint R 04/28/2016,1:54 PM

## 2016-04-28 NOTE — ED Notes (Signed)
Pt had 02 on top of nose. Advised importance of needing to keep it in. Pt stated she did not know it was out. 02 sat 88%. 02 reapplied and sat up to 91% 4 L

## 2016-04-28 NOTE — ED Triage Notes (Addendum)
Per EMS: Pt from [facility], having generalized weakness for past couple days, pt 02 sats at [facility] 88% on 2L. Pt has rattling cough and congestion.  Pt currently on 4L 02 [place] with EMS, 02 sats 95%.  Pt is wheelchair bound, has brain cancer.

## 2016-04-28 NOTE — Progress Notes (Signed)
Pharmacy Antibiotic Note  Allison [name] is a 65 y.o. female admitted on 04/28/2016 with pneumonia.  Pharmacy has been consulted for Vancomycin and renal adjustment dosing.  Plan: Vancomycin 1gm IV every 12 hours.  Goal trough 15-20 mcg/mL.  Continue Cefepime 1gm IV every 8 hours. Monitor labs, micro and vitals.   Height: 5\' 7"  (170.2 cm) Weight: 182 lb (82.6 kg) IBW/kg (Calculated) : 61.6  Temp (24hrs), Avg:97.9 F (36.6 C), Min:97.9 F (36.6 C), Max:97.9 F (36.6 C)   Recent Labs Lab 04/22/16 1313 04/23/16 0519 04/24/16 0553 04/28/16 0924 04/28/16 1108  WBC 9.5  --  9.0 13.6*  --   CREATININE 0.86 0.76 0.72 0.74  --   LATICACIDVEN  --   --   --   --  1.68    Estimated Creatinine Clearance: 79.5 mL/min (by C-G formula based on SCr of 0.74 mg/dL).    Allergies  Allergen Reactions  . Ace Inhibitors Cough  . Amoxicillin-Pot Clavulanate Hives  . Codeine Nausea And Vomiting   Antimicrobials this admission:  Vanc 10/23 >>  Cefepime 10/23 >>   Dose adjustments this admission:  n/a   Microbiology results:  10/23 BCx:   Thank you for allowing pharmacy to be a part of this patient's care.  Pricilla Larsson 04/28/2016 2:04 PM

## 2016-04-29 ENCOUNTER — Telehealth: Payer: Self-pay | Admitting: Internal Medicine

## 2016-04-29 ENCOUNTER — Inpatient Hospital Stay (HOSPITAL_COMMUNITY): Payer: Medicaid Other

## 2016-04-29 ENCOUNTER — Inpatient Hospital Stay
Admission: AD | Admit: 2016-04-29 | Discharge: 2016-05-07 | DRG: 189 | Disposition: E | Payer: Medicaid Other | Source: Other Acute Inpatient Hospital | Attending: Internal Medicine | Admitting: Internal Medicine

## 2016-04-29 DIAGNOSIS — Z515 Encounter for palliative care: Secondary | ICD-10-CM | POA: Diagnosis present

## 2016-04-29 DIAGNOSIS — Z7901 Long term (current) use of anticoagulants: Secondary | ICD-10-CM

## 2016-04-29 DIAGNOSIS — C642 Malignant neoplasm of left kidney, except renal pelvis: Secondary | ICD-10-CM | POA: Diagnosis present

## 2016-04-29 DIAGNOSIS — J440 Chronic obstructive pulmonary disease with acute lower respiratory infection: Secondary | ICD-10-CM | POA: Diagnosis present

## 2016-04-29 DIAGNOSIS — E0951 Drug or chemical induced diabetes mellitus with diabetic peripheral angiopathy without gangrene: Secondary | ICD-10-CM | POA: Diagnosis present

## 2016-04-29 DIAGNOSIS — J189 Pneumonia, unspecified organism: Secondary | ICD-10-CM | POA: Diagnosis present

## 2016-04-29 DIAGNOSIS — J9601 Acute respiratory failure with hypoxia: Secondary | ICD-10-CM

## 2016-04-29 DIAGNOSIS — I110 Hypertensive heart disease with heart failure: Secondary | ICD-10-CM | POA: Diagnosis present

## 2016-04-29 DIAGNOSIS — I739 Peripheral vascular disease, unspecified: Secondary | ICD-10-CM | POA: Diagnosis present

## 2016-04-29 DIAGNOSIS — Z794 Long term (current) use of insulin: Secondary | ICD-10-CM

## 2016-04-29 DIAGNOSIS — E871 Hypo-osmolality and hyponatremia: Secondary | ICD-10-CM | POA: Diagnosis present

## 2016-04-29 DIAGNOSIS — C7931 Secondary malignant neoplasm of brain: Secondary | ICD-10-CM | POA: Diagnosis present

## 2016-04-29 DIAGNOSIS — G92 Toxic encephalopathy: Secondary | ICD-10-CM | POA: Diagnosis present

## 2016-04-29 DIAGNOSIS — C7801 Secondary malignant neoplasm of right lung: Secondary | ICD-10-CM | POA: Diagnosis present

## 2016-04-29 DIAGNOSIS — I255 Ischemic cardiomyopathy: Secondary | ICD-10-CM | POA: Diagnosis present

## 2016-04-29 DIAGNOSIS — R319 Hematuria, unspecified: Secondary | ICD-10-CM | POA: Diagnosis present

## 2016-04-29 DIAGNOSIS — N179 Acute kidney failure, unspecified: Secondary | ICD-10-CM | POA: Diagnosis present

## 2016-04-29 DIAGNOSIS — I4891 Unspecified atrial fibrillation: Secondary | ICD-10-CM | POA: Diagnosis present

## 2016-04-29 DIAGNOSIS — Z955 Presence of coronary angioplasty implant and graft: Secondary | ICD-10-CM | POA: Diagnosis not present

## 2016-04-29 DIAGNOSIS — Z905 Acquired absence of kidney: Secondary | ICD-10-CM

## 2016-04-29 DIAGNOSIS — I5040 Unspecified combined systolic (congestive) and diastolic (congestive) heart failure: Secondary | ICD-10-CM | POA: Diagnosis present

## 2016-04-29 DIAGNOSIS — Z87891 Personal history of nicotine dependence: Secondary | ICD-10-CM

## 2016-04-29 DIAGNOSIS — Z79899 Other long term (current) drug therapy: Secondary | ICD-10-CM

## 2016-04-29 DIAGNOSIS — Z66 Do not resuscitate: Secondary | ICD-10-CM | POA: Diagnosis present

## 2016-04-29 DIAGNOSIS — Z8249 Family history of ischemic heart disease and other diseases of the circulatory system: Secondary | ICD-10-CM

## 2016-04-29 DIAGNOSIS — C7802 Secondary malignant neoplasm of left lung: Secondary | ICD-10-CM | POA: Diagnosis present

## 2016-04-29 DIAGNOSIS — J9600 Acute respiratory failure, unspecified whether with hypoxia or hypercapnia: Secondary | ICD-10-CM | POA: Diagnosis present

## 2016-04-29 DIAGNOSIS — Z7189 Other specified counseling: Secondary | ICD-10-CM

## 2016-04-29 DIAGNOSIS — Z885 Allergy status to narcotic agent status: Secondary | ICD-10-CM

## 2016-04-29 DIAGNOSIS — E0965 Drug or chemical induced diabetes mellitus with hyperglycemia: Secondary | ICD-10-CM | POA: Diagnosis present

## 2016-04-29 DIAGNOSIS — Z888 Allergy status to other drugs, medicaments and biological substances status: Secondary | ICD-10-CM

## 2016-04-29 DIAGNOSIS — J9690 Respiratory failure, unspecified, unspecified whether with hypoxia or hypercapnia: Secondary | ICD-10-CM | POA: Diagnosis present

## 2016-04-29 DIAGNOSIS — J9621 Acute and chronic respiratory failure with hypoxia: Secondary | ICD-10-CM | POA: Diagnosis present

## 2016-04-29 LAB — BASIC METABOLIC PANEL
ANION GAP: 10 (ref 5–15)
BUN: 33 mg/dL — ABNORMAL HIGH (ref 6–20)
CALCIUM: 9 mg/dL (ref 8.9–10.3)
CO2: 21 mmol/L — AB (ref 22–32)
Chloride: 100 mmol/L — ABNORMAL LOW (ref 101–111)
Creatinine, Ser: 1.1 mg/dL — ABNORMAL HIGH (ref 0.44–1.00)
GFR, EST NON AFRICAN AMERICAN: 52 mL/min — AB (ref >60)
GLUCOSE: 462 mg/dL — AB (ref 65–99)
POTASSIUM: 4.2 mmol/L (ref 3.5–5.1)
Sodium: 131 mmol/L — ABNORMAL LOW (ref 135–145)

## 2016-04-29 LAB — BLOOD GAS, ARTERIAL
ACID-BASE DEFICIT: 1.8 mmol/L (ref 0.0–2.0)
ACID-BASE DEFICIT: 1.3 mmol/L (ref 0.0–2.0)
ACID-BASE EXCESS: 3.6 mmol/L — AB (ref 0.0–2.0)
BICARBONATE: 21.1 mmol/L (ref 20.0–28.0)
Bicarbonate: 22.7 mmol/L (ref 20.0–28.0)
Bicarbonate: 23 mmol/L (ref 20.0–28.0)
DELIVERY SYSTEMS: POSITIVE
DELIVERY SYSTEMS: POSITIVE
DRAWN BY: 382351
Delivery systems: POSITIVE
Drawn by: 234301
Drawn by: 234301
EXPIRATORY PAP: 8
Expiratory PAP: 7
Expiratory PAP: 8
FIO2: 1
FIO2: 80
FIO2: 80
INSPIRATORY PAP: 18
Inspiratory PAP: 14
Inspiratory PAP: 18
O2 Content: 100 L/min
O2 SAT: 92.8 %
O2 Saturation: 92.7 %
O2 Saturation: 91 %
PATIENT TEMPERATURE: 37
PCO2 ART: 40.9 mmHg (ref 32.0–48.0)
PCO2 ART: 41.5 mmHg (ref 32.0–48.0)
PCO2 ART: 42.4 mmHg (ref 32.0–48.0)
PH ART: 7.365 (ref 7.350–7.450)
PH ART: 7.367 (ref 7.350–7.450)
PO2 ART: 78.3 mmHg — AB (ref 83.0–108.0)
Patient temperature: 37
pH, Arterial: 7.325 — ABNORMAL LOW (ref 7.350–7.450)
pO2, Arterial: 72.2 mmHg — ABNORMAL LOW (ref 83.0–108.0)
pO2, Arterial: 66.8 mmHg — ABNORMAL LOW (ref 83.0–108.0)

## 2016-04-29 LAB — CBC
HEMATOCRIT: 38.7 % (ref 36.0–46.0)
HEMOGLOBIN: 12.9 g/dL (ref 12.0–15.0)
MCH: 30.1 pg (ref 26.0–34.0)
MCHC: 33.3 g/dL (ref 30.0–36.0)
MCV: 90.2 fL (ref 78.0–100.0)
Platelets: 183 10*3/uL (ref 150–400)
RBC: 4.29 MIL/uL (ref 3.87–5.11)
RDW: 17 % — ABNORMAL HIGH (ref 11.5–15.5)
WBC: 14.7 10*3/uL — ABNORMAL HIGH (ref 4.0–10.5)

## 2016-04-29 LAB — GLUCOSE, CAPILLARY
GLUCOSE-CAPILLARY: 385 mg/dL — AB (ref 65–99)
GLUCOSE-CAPILLARY: 375 mg/dL — AB (ref 65–99)
Glucose-Capillary: 430 mg/dL — ABNORMAL HIGH (ref 65–99)
Glucose-Capillary: 358 mg/dL — ABNORMAL HIGH (ref 65–99)

## 2016-04-29 LAB — BRAIN NATRIURETIC PEPTIDE: B Natriuretic Peptide: 370 pg/mL — ABNORMAL HIGH (ref 0.0–100.0)

## 2016-04-29 LAB — MRSA PCR SCREENING: MRSA BY PCR: NEGATIVE

## 2016-04-29 MED ORDER — INSULIN GLARGINE 100 UNIT/ML ~~LOC~~ SOLN
10.0000 [IU] | Freq: Once | SUBCUTANEOUS | Status: AC
  Administered 2016-04-29: 10 [IU] via SUBCUTANEOUS
  Filled 2016-04-29: qty 0.1

## 2016-04-29 MED ORDER — SODIUM CHLORIDE 0.9 % IV SOLN: 250.0000 mL | INTRAVENOUS | Status: DC | PRN

## 2016-04-29 MED ORDER — FUROSEMIDE 10 MG/ML IJ SOLN
80.0000 mg | Freq: Once | INTRAMUSCULAR | Status: AC
  Administered 2016-04-29: 80 mg via INTRAVENOUS
  Filled 2016-04-29: qty 8

## 2016-04-29 MED ORDER — FUROSEMIDE 10 MG/ML IJ SOLN: 40.0000 mg | Freq: Two times a day (BID) | INTRAMUSCULAR | Status: DC

## 2016-04-29 MED ORDER — LORAZEPAM 2 MG/ML IJ SOLN
5.0000 mg/h | INTRAVENOUS | Status: DC
  Filled 2016-04-29: qty 25

## 2016-04-29 MED ORDER — INSULIN GLARGINE 100 UNIT/ML ~~LOC~~ SOLN
10.0000 [IU] | Freq: Two times a day (BID) | SUBCUTANEOUS | Status: DC
  Administered 2016-04-29: 10 [IU] via SUBCUTANEOUS
  Filled 2016-04-29 (×3): qty 0.1

## 2016-04-29 MED ORDER — MORPHINE 100MG IN NS 100ML (1MG/ML) PREMIX INFUSION
10.0000 mg/h | INTRAVENOUS | Status: DC
  Administered 2016-04-29: 10 mg/h via INTRAVENOUS
  Filled 2016-04-29: qty 100

## 2016-04-29 MED ORDER — DEXTROSE 50 % IV SOLN: 25.0000 mL | INTRAVENOUS | Status: DC | PRN

## 2016-04-29 MED ORDER — INSULIN GLARGINE 100 UNIT/ML ~~LOC~~ SOLN
15.0000 [IU] | Freq: Two times a day (BID) | SUBCUTANEOUS | Status: DC
  Filled 2016-04-29 (×2): qty 0.15

## 2016-04-29 MED ORDER — INSULIN ASPART 100 UNIT/ML IV SOLN
20.0000 [IU] | Freq: Once | INTRAVENOUS | Status: AC
  Administered 2016-04-29: 20 [IU] via SUBCUTANEOUS

## 2016-04-29 MED ORDER — DILTIAZEM HCL 100 MG IV SOLR
5.0000 mg/h | INTRAVENOUS | Status: DC
  Administered 2016-04-29: 5 mg/h via INTRAVENOUS
  Filled 2016-04-29 (×2): qty 100

## 2016-04-29 MED ORDER — MORPHINE BOLUS VIA INFUSION
5.0000 mg | INTRAVENOUS | Status: DC | PRN
  Administered 2016-04-29: 20 mg via INTRAVENOUS
  Filled 2016-04-29: qty 20

## 2016-04-29 MED ORDER — FENTANYL CITRATE (PF) 100 MCG/2ML IJ SOLN
INTRAMUSCULAR | Status: AC
  Filled 2016-04-29: qty 2

## 2016-04-29 MED ORDER — DEXAMETHASONE SODIUM PHOSPHATE 10 MG/ML IJ SOLN
8.0000 mg | Freq: Two times a day (BID) | INTRAMUSCULAR | Status: DC
  Filled 2016-04-29 (×2): qty 0.8

## 2016-04-29 MED ORDER — INSULIN ASPART 100 UNIT/ML ~~LOC~~ SOLN: 0.0000 [IU] | SUBCUTANEOUS | Status: DC

## 2016-04-29 MED ORDER — FENTANYL CITRATE (PF) 100 MCG/2ML IJ SOLN
100.0000 ug | Freq: Once | INTRAMUSCULAR | Status: AC
  Administered 2016-04-29: 100 ug via INTRAVENOUS

## 2016-04-29 MED ORDER — MORPHINE SULFATE (PF) 4 MG/ML IV SOLN
INTRAVENOUS | Status: AC
  Administered 2016-04-29: 2 mg
  Filled 2016-04-29: qty 1

## 2016-04-29 MED ORDER — LORAZEPAM 2 MG/ML IJ SOLN
2.0000 mg | Freq: Once | INTRAMUSCULAR | Status: DC
  Filled 2016-04-29: qty 1

## 2016-04-29 MED ORDER — FENTANYL CITRATE (PF) 100 MCG/2ML IJ SOLN
25.0000 ug | INTRAMUSCULAR | Status: DC | PRN
  Administered 2016-04-29: 25 ug via INTRAVENOUS
  Filled 2016-04-29: qty 2

## 2016-04-29 MED ORDER — ONDANSETRON HCL 4 MG/2ML IJ SOLN: 4.0000 mg | Freq: Four times a day (QID) | INTRAMUSCULAR | Status: DC

## 2016-04-29 MED ORDER — MORPHINE SULFATE (PF) 2 MG/ML IV SOLN: 2.0000 mg | Freq: Once | INTRAVENOUS | Status: AC

## 2016-04-29 MED ORDER — SODIUM CHLORIDE 0.9 % IV SOLN
INTRAVENOUS | Status: DC
  Administered 2016-04-29: 14:00:00 via INTRAVENOUS

## 2016-04-29 MED ORDER — LORAZEPAM BOLUS VIA INFUSION
2.0000 mg | INTRAVENOUS | Status: DC | PRN
  Filled 2016-04-29: qty 5

## 2016-04-29 MED ORDER — NITROGLYCERIN 2 % TD OINT
0.5000 [in_us] | TOPICAL_OINTMENT | Freq: Four times a day (QID) | TRANSDERMAL | Status: DC
  Administered 2016-04-29 (×2): 0.5 [in_us] via TOPICAL
  Filled 2016-04-29 (×2): qty 1

## 2016-04-29 MED ORDER — SODIUM CHLORIDE 0.9 % IV SOLN
INTRAVENOUS | Status: DC
  Administered 2016-04-29: 3.2 [IU]/h via INTRAVENOUS
  Filled 2016-04-29: qty 2.5

## 2016-04-29 MED ORDER — INSULIN REGULAR BOLUS VIA INFUSION
0.0000 [IU] | Freq: Three times a day (TID) | INTRAVENOUS | Status: DC
  Filled 2016-04-29: qty 10

## 2016-04-29 MED ORDER — DILTIAZEM LOAD VIA INFUSION
10.0000 mg | Freq: Once | INTRAVENOUS | Status: AC
  Administered 2016-04-29: 10 mg via INTRAVENOUS
  Filled 2016-04-29: qty 10

## 2016-04-30 LAB — GLUCOSE, CAPILLARY: Glucose-Capillary: 181 mg/dL — ABNORMAL HIGH (ref 65–99)

## 2016-04-30 LAB — HIV ANTIBODY (ROUTINE TESTING W REFLEX): HIV Screen 4th Generation wRfx: NONREACTIVE

## 2016-04-30 LAB — HEMOGLOBIN A1C
HEMOGLOBIN A1C: 8.5 % — AB (ref 4.8–5.6)
Mean Plasma Glucose: 197 mg/dL

## 2016-05-01 LAB — GLUCOSE, CAPILLARY: GLUCOSE-CAPILLARY: 141 mg/dL — AB (ref 65–99)

## 2016-05-03 LAB — CULTURE, BLOOD (ROUTINE X 2)
CULTURE: NO GROWTH
Culture: NO GROWTH

## 2016-05-07 NOTE — Progress Notes (Signed)
[facility] here to transport patient to [facility]. Report called to [place], RN at [facility]. Verbalized understanding.

## 2016-05-07 NOTE — Progress Notes (Signed)
CH responded to an End of Life OR for  Room IC 6. Pt was unresponsive and breathing with respiratory mask. Many family members were bedside and very emotional. CH provided prayer for the family and for the Pt. [place]. The nurse provided the transition to comfort care and removed the mask.  [place] read appropriate and comforting scripture. CH provided individual consultation with family members. [place] provided the ministry of presence. [place] assessed that the family was in a better state and left them with their final time together. CH is available for follow up as needed.    2016/05/25 1800  Clinical Encounter Type  Visited With Patient;Patient and family together;Health care provider  Visit Type Initial;Spiritual support;Critical Care;Patient actively dying  Referral From Nurse  Spiritual Encounters  Spiritual Needs Sacred text;Prayer;Emotional;Grief support  Stress Factors  Patient Stress Factors Health changes;Major life changes  Family Stress Factors Exhausted;Loss

## 2016-05-07 NOTE — Progress Notes (Signed)
1825 slowing and agonal respirations noted. Ativan drip infusing at 5mg /hr and  Morphine drip infusing at 10mg /hr.as orderted.

## 2016-05-07 NOTE — Telephone Encounter (Signed)
Spoke with pt's daughter in [employer]. Advised her that she is not listed on pt's DPR. She states that they have already got this matter handled. Nothing further was needed.

## 2016-05-07 NOTE — Progress Notes (Addendum)
PROGRESS NOTE                                                                                                                                                                                                             Patient Demographics:    Allison [name], is a 65 y.o. female, DOB - 27-Sep-1950, [id number]  Admit date - 04/28/2016   Admitting Physician Thurnell Lose, MD  Outpatient Primary MD for the patient is [facility]  LOS - 1  Chief Complaint  Patient presents with  . Weakness       Brief Narrative    Allison [name]  is a 65 y.o. 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 Arlyss Gandy and [facility] [place] cardiologist Dr. 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  Patient was recently admitted to [facility] for deconditioning and weakness currently living at [facility] comes in with 2 day history of shortness of breath and a productive cough, denies any fever chills, no headache or chest pain, no new focal weakness, no abdominal pain, no diarrhea, no blood in stool or urine, in the ER workup suggestive of acute on chronic hypoxic respiratory failure due to combination of multilobar pneumonia along with lung metastases and COPD. I was called to admit the patient. She was also found to be in RVR in the ER. Long-term prognosis appears poor, palliative care has been consulted and family eating is scheduled for 11 AM on 05/10/16.    Subjective:    Allison [name] today has, No headache, No chest pain, No abdominal pain - No Nausea, No new weakness tingling or numbness, ++Cough - SOB.     Assessment  & Plan :    1. Acute on chronic hypoxic respiratory failure due to multilobar pneumonia in a patient with COPD on 2 L nasal cannula home oxygen with known lung metastases. She was placed on appropriate IV antibiotics however she developed Worsening of shortness  of breath despite appropriate IV antibiotics, oxygen and nebulizer treatments, with possibility of mild CHF decompensation. She was transferred to stepdown unit, placed on BiPAP, IV Lasix was added, due to her underlying health issues are prognosis again appears poor, palliative care was involved on 04/28/2016 itself in a family meeting is scheduled for today.  Addendum - detailed DW Patient, family, Pall care, oncologist Dr Alen Blew over the speaker phone, Dr Nelda Marseille PCCM, all agree that prognosis is extremely poor, she is a poor candidate for CPR or Intubation. ICU at [facility] refused Intubation which I agree with, family then wanted to call [place], Dr Alva Garnet ICU graciously accepted, patient currently protecting airway, will transfer on BiPAP, son is EMT agrees.   2. Renal cell cancer with known metastases to lungs and brain. Recent right foot drop a few months ago due to brain metastases, chronic left lower extremity weakness, under the care of Dr. Alen Blew, Tammi Klippel and [facility], for now she was on Decadron oral which will be switched to IV at a higher dose to assist with COPD and current stress of illness. Thereafter outpatient follow-up with her subspecialists.  3. Chronic A. fib, currently in RVR. [place] vasc 2 score of at least 4. Continue oral Lopressor, Eliquis, as she is on BiPAP and in stepdown we will transition her oral Cardizem to IV drip for better control.    4.  Acute on Chronic combined systolic and diastolic CHF. EF 40%. Hold IV fluids, initiate IV Lasix, I think primary component is  still #1 above however with her worsening respiratory status trial of IV Lasix is appropriate.  5. CAD with ischemic cardiomyopathy. Currently no acute issues, continue home dose beta blocker, statin for secondary prevention. On Eliquis continued.  6. Brain metastases causing left leg weakness, right foot drop, PT, continue steroids. Supportive care.  7. Chronic hematuria due to renal cancer. Outpatient follow-up with her urologist.  8. Dyslipidemia. Continue statin.  9. Steroid-induced DM type II. Check A1c, continue Lantus CBGs ++ high due to steroids, will start stabilizer drip + lantus.  Lab Results  Component Value Date   HGBA1C 8.1 (H) 04/22/2016   CBG (last 3)   Recent Labs  04/28/16 0926 2016/05/26 0827  GLUCAP 274* 385*      Family Communication  :  None  Code Status :  Full for now  Diet : NPO except Meds  Disposition Plan  :  Stepdown  Consults  :  Pall Care  Procedures  :    Bipap  DVT Prophylaxis  :  Eliquis  Lab Results  Component Value Date   PLT 248 04/28/2016    Inpatient Medications  Scheduled Meds: . apixaban  5 mg Oral BID  . atorvastatin  10 mg Oral q1800  . budesonide (PULMICORT) nebulizer solution  0.25 mg Nebulization BID  . ceFEPime (MAXIPIME) IV  1 g Intravenous Q8H  . dexamethasone  10 mg Intravenous Q12H  . escitalopram  20 mg Oral QHS  . furosemide  40 mg Intravenous BID  . guaiFENesin  600 mg Oral BID  . insulin aspart  0-15 Units Subcutaneous TID WC  . insulin aspart  0-5 Units Subcutaneous QHS  . insulin glargine  15 Units Subcutaneous QHS  . LORazepam  0.5 mg Oral Daily  . metoprolol  100 mg Oral BID  . nitroGLYCERIN  0.5 inch Topical Q6H  . polyethylene glycol  17 g Oral BID  . potassium chloride  10 mEq Oral Daily  . senna-docusate  1 tablet Oral BID  . vancomycin  1,000 mg Intravenous Q12H   Continuous Infusions: . diltiazem (CARDIZEM) infusion 5 mg/hr (05-21-16 0821)   PRN Meds:.acetaminophen, albuterol,  diphenhydrAMINE, HYDROcodone-acetaminophen, metoprolol, ondansetron (ZOFRAN) IV  Antibiotics  :    Anti-infectives    Start     Dose/Rate Route Frequency Ordered Stop   04/28/16 2200  ceFEPIme (MAXIPIME) 1 g in dextrose 5 % 50 mL IVPB     1 g 100 mL/hr over 30 Minutes Intravenous Every 8 hours 04/28/16 1959 05/06/16 2159   04/28/16 2200  vancomycin (VANCOCIN) IVPB 1000 mg/200 mL premix     1,000 mg 200 mL/hr over 60 Minutes Intravenous Every 12 hours 04/28/16 1449     04/28/16 1100  ceFEPIme (MAXIPIME) 2 g in dextrose 5 % 50 mL IVPB     2 g 100 mL/hr over 30 Minutes Intravenous  Once 04/28/16 1050 04/28/16 1258   04/28/16 1100  vancomycin (VANCOCIN) IVPB 1000 mg/200 mL premix     1,000 mg 200 mL/hr over 60 Minutes Intravenous  Once 04/28/16 1050 04/28/16 1227         Objective:   Vitals:   05/21/2016 0500 05-21-2016 0740 05-21-16 0746 05/21/2016 0828  BP:      Pulse:  (!) 125 (!) 119   Resp:  (!) 28 (!) 28   Temp: (!) 96.9 F (36.1 C)  (!) 96.8 F (36 C)   TempSrc: Axillary  Axillary   SpO2:   95% 95%  Weight: 80.9 kg (178 lb 5.6 oz)     Height:        Wt Readings from Last 3 Encounters:  05/21/2016 80.9 kg (178 lb 5.6 oz)  04/22/16 79.3 kg (174 lb 12.8 oz)  03/31/16 86 kg (189 lb 11.2 oz)     Intake/Output Summary (Last 24 hours) at 2016-05-21 0939 Last data filed at 2016/05/21 0500  Gross per 24 hour  Intake           953.33 ml  Output              600 ml  Net           353.33 ml     Physical Exam  Awake but confused, No new F.N deficits, Normal affect [place].AT,PERRAL Supple Neck,No JVD, No cervical lymphadenopathy appriciated.  Symmetrical Chest wall movement, Good air movement bilaterally, bilat rales RRR,No Gallops,Rubs or new Murmurs, No Parasternal Heave +ve B.Sounds, Abd Soft, No tenderness, No organomegaly appriciated, No rebound - guarding or rigidity. No Cyanosis, Clubbing or edema, No new Rash or bruise       Data Review:    CBC  Recent Labs Lab  04/22/16 1313 04/24/16 0553 04/28/16 0924  WBC 9.5 9.0 13.6*  HGB 13.5 12.4 13.6  HCT 39.5 37.4 40.0  PLT 152 164 248  MCV 87.0 89.5 89.5  MCH 29.7 29.7 30.4  MCHC 34.2 33.2 34.0  RDW 15.6* 16.1* 16.6*  LYMPHSABS 0.3*  --   --   MONOABS 0.5  --   --   EOSABS 0.0  --   --   BASOSABS 0.0  --   --     Chemistries   Recent Labs Lab 04/22/16 1313 04/23/16 0519 04/24/16 0553 04/28/16 0924  NA 133* 139 136 134*  K 4.0 4.7 4.4 4.5  CL 96* 105 105 101  CO2 25 28 23 25   GLUCOSE 466* 267* 236* 281*  BUN 28* 32* 35* 20  CREATININE 0.86 0.76 0.72  0.74  CALCIUM 8.9 8.8* 8.8* 9.2  MG 2.2  --  2.1  --   AST 21 16  --   --   ALT 71* 56*  --   --   ALKPHOS 102 83  --   --   BILITOT 1.0 1.1  --   --    ------------------------------------------------------------------------------------------------------------------ No results for input(s): CHOL, HDL, LDLCALC, TRIG, CHOLHDL, LDLDIRECT in the last 72 hours.  Lab Results  Component Value Date   HGBA1C 8.1 (H) 04/22/2016   ------------------------------------------------------------------------------------------------------------------ No results for input(s): TSH, T4TOTAL, T3FREE, THYROIDAB in the last 72 hours.  Invalid input(s): FREET3 ------------------------------------------------------------------------------------------------------------------ No results for input(s): VITAMINB12, FOLATE, FERRITIN, TIBC, IRON, RETICCTPCT in the last 72 hours.  Coagulation profile  Recent Labs Lab 04/22/16 1313  INR 1.45    No results for input(s): DDIMER in the last 72 hours.  Cardiac Enzymes No results for input(s): CKMB, TROPONINI, MYOGLOBIN in the last 168 hours.  Invalid input(s): CK ------------------------------------------------------------------------------------------------------------------    Component Value Date/Time   BNP 370.0 (H) 05-17-2016 0042    Micro Results Recent Results (from the past 240 hour(s))    Culture, blood (Routine X 2) w Reflex to ID Panel     Status: None (Preliminary result)   Collection Time: 04/28/16 10:48 AM  Result Value Ref Range Status   Specimen Description BLOOD RIGHT HAND  Final   Special Requests BOTTLES DRAWN AEROBIC AND ANAEROBIC 6CC  Final   Culture NO GROWTH < 24 HOURS  Final   Report Status PENDING  Incomplete  Culture, blood (Routine X 2) w Reflex to ID Panel     Status: None (Preliminary result)   Collection Time: 04/28/16 10:50 AM  Result Value Ref Range Status   Specimen Description BLOOD LEFT HAND  Final   Special Requests BOTTLES DRAWN AEROBIC ONLY 4CC  Final   Culture NO GROWTH < 24 HOURS  Final   Report Status PENDING  Incomplete  MRSA PCR Screening     Status: None   Collection Time: May 17, 2016 12:49 AM  Result Value Ref Range Status   MRSA by PCR NEGATIVE NEGATIVE Final    Comment:        The GeneXpert MRSA Assay (FDA approved for NASAL specimens only), is one component of a comprehensive MRSA colonization surveillance program. It is not intended to diagnose MRSA infection nor to guide or monitor treatment for MRSA infections.     Radiology Reports Ct Head Wo Contrast  Result Date: 04/22/2016 CLINICAL DATA:  Renal cell carcinoma with brain metastases. Gait disorder EXAM: CT HEAD WITHOUT CONTRAST TECHNIQUE: Contiguous axial images were obtained from the base of the skull through the vertex without intravenous contrast. COMPARISON:  Head CT March 25, 2016 and brain MRI March 30, 2016 FINDINGS: Brain: Mild diffuse atrophy is stable. There is a focal mass with surrounding vasogenic edema in the right frontal lobe, stable. The mass measures 7 x 6 mm, unchanged from recent CT examination. This mass shows increased attenuation, likely due to hypercellularity, stable. There is a second mass noted in the posterior superior left frontal lobe with extensive vasogenic edema which also shows increased attenuation, likely due to hypercellularity.  This mass measures 1.0 x 0.9 cm, unchanged. The degree of vasogenic edema is stable in this area as well as in the right frontal lobe. No new mass is evident on this noncontrast enhanced study. There is no finding felt to represent acute hemorrhage. There is no midline shift or extra-axial fluid. There  is mild small vessel disease in the centra semiovale bilaterally, stable. No acute infarct is demonstrated on this study. Vascular: No hyperdense lesion evident. There are foci of calcification in each carotid siphon. Skull: The bony calvarium is somewhat osteoporotic. No blastic or lytic bone lesions are identified. No evident fracture. Sinuses/Orbits: And visualized paranasal sinuses are clear. Visualized orbits appear symmetric bilaterally. Other: Mastoid air cells are clear. IMPRESSION: There is a mass in the anterior right frontal lobe midportion as well as in the superior posterior left frontal lobe with extensive surrounding vasogenic edema in both areas. These masses appear hypercellular and unchanged. No new mass is evident on this noncontrast enhanced study. There is underlying atrophy with periventricular small vessel disease. There are no findings felt to represent acute hemorrhage or progression of edema. No midline shift. There are foci of calcification in both carotid siphon regions. Electronically Signed   By: Lowella Grip III M.D.   On: 04/22/2016 13:24   Ct Thoracic Spine Wo Contrast  Result Date: 04/22/2016 CLINICAL DATA:  Progressive gait disturbance. Progressive leg weakness. Metastatic renal cell cancer. EXAM: CT THORACIC AND LUMBAR SPINE WITHOUT CONTRAST TECHNIQUE: Multidetector CT imaging of the thoracic and lumbar spine was performed without contrast. Multiplanar CT image reconstructions were also generated. COMPARISON:  CT studies most recently 11/27/2015 FINDINGS: CT THORACIC SPINE FINDINGS Alignment: Normal Vertebrae: No fracture or destructive lesion Paraspinal and other soft  tissues: Multiple pulmonary metastases as previously evaluated. Aortic atherosclerosis. Disc levels: No likely significant degenerative change in the thoracic region. No stenosis of the canal or foramina. CT cannot evaluate for cord metastases. CT LUMBAR SPINE FINDINGS Segmentation: 5 lumbar type vertebral bodies. Alignment: Normal Vertebrae: No fracture or destructive lesion. Paraspinal and other soft tissues: Previous left nephrectomy. Aortic atherosclerosis. Disc levels: No significant degenerative change. Minimal disc bulges and facet arthritis. No compressive stenosis. IMPRESSION: CT THORACIC SPINE IMPRESSION No fracture or destructive bone lesion. No apparent spinal stenosis. CT cannot evaluate for cord metastases. CT LUMBAR SPINE IMPRESSION No fracture or destructive bone lesion. No apparent stenosis. CT cannot evaluate for small intradural metastases. Electronically Signed   By: Nelson Chimes M.D.   On: 04/22/2016 17:38   Ct Lumbar Spine Wo Contrast  Result Date: 04/22/2016 CLINICAL DATA:  Progressive gait disturbance. Progressive leg weakness. Metastatic renal cell cancer. EXAM: CT THORACIC AND LUMBAR SPINE WITHOUT CONTRAST TECHNIQUE: Multidetector CT imaging of the thoracic and lumbar spine was performed without contrast. Multiplanar CT image reconstructions were also generated. COMPARISON:  CT studies most recently 11/27/2015 FINDINGS: CT THORACIC SPINE FINDINGS Alignment: Normal Vertebrae: No fracture or destructive lesion Paraspinal and other soft tissues: Multiple pulmonary metastases as previously evaluated. Aortic atherosclerosis. Disc levels: No likely significant degenerative change in the thoracic region. No stenosis of the canal or foramina. CT cannot evaluate for cord metastases. CT LUMBAR SPINE FINDINGS Segmentation: 5 lumbar type vertebral bodies. Alignment: Normal Vertebrae: No fracture or destructive lesion. Paraspinal and other soft tissues: Previous left nephrectomy. Aortic  atherosclerosis. Disc levels: No significant degenerative change. Minimal disc bulges and facet arthritis. No compressive stenosis. IMPRESSION: CT THORACIC SPINE IMPRESSION No fracture or destructive bone lesion. No apparent spinal stenosis. CT cannot evaluate for cord metastases. CT LUMBAR SPINE IMPRESSION No fracture or destructive bone lesion. No apparent stenosis. CT cannot evaluate for small intradural metastases. Electronically Signed   By: Nelson Chimes M.D.   On: 04/22/2016 17:38   Mr Jeri Cos [id number] Contrast  Result Date: 03/30/2016 CLINICAL DATA:  65 year old female with  metastatic renal cell carcinoma. 2 metastatic brain lesions suspected on recent routine brain MRI done for right side weakness. Planning for stereotactic radio surgery. Subsequent encounter. EXAM: MRI HEAD WITHOUT AND WITH CONTRAST TECHNIQUE: Multiplanar, multiecho pulse sequences of the brain and surrounding structures were obtained without and with intravenous contrast. CONTRAST:  49mL MULTIHANCE GADOBENATE DIMEGLUMINE 529 MG/ML IV SOLN COMPARISON:  MRI 03/25/2016 and earlier. FINDINGS: Brain: 11 mm diameter anterior right frontal lobe white matter metastasis appears stable (series 10, image 108). No overt associated blood products on T2 * imaging. 14 mm left posterior frontal lobe superior white matter metastasis with hemosiderin also appears stable (series 10, image 112). No other brain metastasis identified. No dural thickening identified. Both lesions above are associated with vasogenic edema in the surrounding brain parenchyma which is stable since 03/26/2016. Mild associated regional mass effect, more so on the left. No restricted diffusion or evidence of acute infarction. Stable ventricle size and configuration. No extra-axial collection or other acute or chronic cerebral blood products. Normal basilar cisterns. Negative pituitary, cervicomedullary junction and visualized cervical spinal cord. Vascular: Major intracranial vascular  flow voids are stable. Skull and upper cervical spine: Visible bone marrow signal appears stable and within normal limits. Chronic hyperostosis of the calvarium. Negative visualized cervical vertebrae. Sinuses/Orbits: Negative. Other: Visible internal auditory structures appear normal. IMPRESSION: 1. Stable 11 mm anterior right frontal lobe white matter and 14 mm left posterior superior frontal lobe white matter metastases, with some associated hemosiderin. Stable associated vasogenic edema and mild regional mass effect. 2. No other metastatic disease or new intracranial abnormality identified. Electronically Signed   By: Genevie Ann M.D.   On: 03/30/2016 19:17   Mr Lumbar Spine Wo Contrast  Result Date: 04/22/2016 CLINICAL DATA:  Metastatic renal cell cancer. Lower extremity weakness and difficulty walking. EXAM: MRI LUMBAR SPINE WITHOUT CONTRAST TECHNIQUE: Multiplanar, multisequence MR imaging of the lumbar spine was performed. No intravenous contrast was administered. COMPARISON:  CT same day FINDINGS: The patient became claustrophobic and terminated the examination after T sagittal sequences. Segmentation:  5 lumbar type vertebral bodies. Alignment:  Minimal curvature convex to the left. Vertebrae: Fatty marrow suggesting previous radiation. No marrow space lesion. No fracture. Conus medullaris: Extends to the L1-2 level and appears normal. The distal cord and conus region appear normal. Paraspinal and other soft tissues: No axial images for evaluation. Disc levels: Mild non-compressive bulging of the L2-3 disc. The other levels are negative. There is no central canal stenosis. No foraminal stenosis. No edematous facet arthropathy. IMPRESSION: Study terminated because of severe patient claustrophobia. We see the region from the inferior endplate of 624THL through S2, in there is no evidence of metastatic disease to this region. There is no spinal stenosis. The distal cord and nerve roots appear normal. The study  does not show an explanation for the clinical presentation. Electronically Signed   By: Nelson Chimes M.D.   On: 04/22/2016 18:01   Dg Chest Port 1 View  Result Date: 05/28/16 CLINICAL DATA:  Left-sided renal cell carcinoma with lung and brain metastasis. Respiratory insufficiency. EXAM: PORTABLE CHEST 1 VIEW COMPARISON:  04/28/2016 FINDINGS: Midline trachea. Mild cardiomegaly. Atherosclerosis in the transverse aorta. Possible small right pleural effusion. No pneumothorax. Multifocal pulmonary opacities are primarily similar. There may be slight increase in right lower lobe opacification. No overt congestive failure. IMPRESSION: Multifocal pulmonary opacities, primarily felt to represent metastatic disease. Possible increasing superimposed right lower lobe pneumonia. Cardiomegaly without congestive failure. Possible small right pleural effusion. Electronically  Signed   By: Abigail Miyamoto M.D.   On: 2016/05/01 07:50   Dg Chest Portable 1 View  Result Date: 04/28/2016 CLINICAL DATA:  Chest congestion. EXAM: PORTABLE CHEST 1 VIEW COMPARISON:  11/27/2015 chest CT FINDINGS: Bilateral nodular airspace disease with confluent opacity at the bases. Patient has known pulmonary metastases but the nodular airspace disease is likely also pneumonia based on 04/22/2016 thoracic spine CT. Interstitial coarsening without suspected edema. No effusion or pneumothorax. Normal heart size for technique. IMPRESSION: Bilateral pulmonary nodules that are increased from 11/27/2015. Based on recent spinal CT this is likely a combination of known metastases and multi focal pneumonia. Electronically Signed   By: Monte Fantasia M.D.   On: 04/28/2016 09:52    Time Spent in minutes  30   Layan Zalenski K M.D on 05/01/16 at 9:39 AM  Between 7am to 7pm - Pager - [phone]  After 7pm go to [email or web address] - password [facility]  [facility] -  Office  [phone]

## 2016-05-07 NOTE — Progress Notes (Signed)
Patient taken off of BiPap due to being made comfort measures. Myra, RN aware.

## 2016-05-07 NOTE — Discharge Summary (Signed)
  DEATH SUMMARY  Name: Allison [name] Date of Birth: 1951-04-13           Date of Admission:25-May-2016 Date of Death:05/25/2016   ADMISSION DATE:  2016/05/25 CONSULTATION DATE:  05-25-13 REFERRING MD : Dr. Yehuda Mao is a 65 year old female past medical history of left-sided renal carcinoma s/p nephrectomy with known metastasis to the brain, and lungs, coronary artery disease, atrial fibrillation, steroid-induced diabetes, chronic left weakness ,  right foot drop, COPD on 2 L, peripheral artery disease, dyslipidemia, combined systolic and diastolic heart failure with  EF of  40% She was admitted to [facility] with deconditioning and weakness, on 04/28/2016, noted to have worsening respiratory status, chest x-ray was  consistent with multilobar pneumonia along with multiple pulmonary opacities, worsening respiratory failure requiring BiPAP therapy,.  She was transferred to [facility] for further care, upon family request. Noted to have afib with RVR on Cardizem, minimally responsive on the Bipap.Patient was recently admitted to [facility] for deconditioning and weakness, was living at nursing home, presented with  with 2 day history of shortness of breath and a productive cough  Upon arrival to [facility] ICU unit, patient noted to be lethargic, only responding to sternal rub, no purposeful movements noted. She was noted to have very low FEV1(36%) on PFT conducted in January 2017. Family conference  was held, which included her son and Husband, informed them of the critical condition of the patient along with her poor prognosis.  Given her poor prognosis and critical condition with renal cell caner with metastasis to the brain and lung along with hypoxic respiratory failure, family (son and husband) stated that she is a DO NOT RESUSCITATE/DO NOT INTUBATE, and instituted comfort care measures.    Problem list Acute on Chronic Hypoxic Respiratory Failure Mulitlobar  pneumonia Pulmonary opacities - RCC mets to the lungs Brain Metastasis - secondary to RCC  Renal Cell Ca s/p left nephrectomy Acute Renal Failure Hyponatremia Chronic Hematuria COPD on 2L Steroid induced hyperglycemia Afib w/RVR Combined systolic and diastolic CHF - EF [id number] Toxic encephlopathy  Date of Death - 2016/05/25 Time of Death - 31-Oct-2238  Vilinda Boehringer, MD [facility] Pager [phone] (please enter 7-digits) On Call Pager - [phone] (please enter 7-digits)

## 2016-05-07 NOTE — Care Management Note (Signed)
Case Management Note  Patient Details  Name: JAILEE GALINDEZ MRN: [id number] Date of Birth: Jul 21, 1950  Subjective/Objective:                  Pt admitted in resp distress from [facility] SNF. Pt critically ill and being transferred to [facility]. Palliative working with pt/family. Palliative, CSW and CM will cont to follow after transfer.   Action/Plan: CM will cont to follow for DC planning needs.   Expected Discharge Date:  05/02/16               Expected Discharge Plan:  [place]  In-House Referral:  Clinical Social Work, Hospice / Palliative Care  Discharge planning Services  CM Consult  Post Acute Care Choice:  NA Choice offered to:  NA  Status of Service:  In process, will continue to follow    Sherald Barge, RN 28-May-2016, 3:18 PM

## 2016-05-07 NOTE — Progress Notes (Signed)
1700 admitted to unit directly from [facility] via EMS. Unresponsive with no purposeful movement and only minimal movement to painful stimuli. BiPAP on patient when admitted. Unable to doppler pedal or posterior tibial pulses bilaterally. Both feet are cold to touch. Dr. Stevenson Clinch in to see patient.

## 2016-05-07 NOTE — Progress Notes (Signed)
Called report to Chancellor, Therapist, sports at [employer] . Verbalized understanding. Truck to arrive in 45 minutes.

## 2016-05-07 NOTE — Progress Notes (Signed)
TC made to pt's husband to notify him of pt's status and transfer to ICU Stepdown. No answer. Left message for him to call [facility].

## 2016-05-07 NOTE — Progress Notes (Signed)
Inpatient Diabetes Program Recommendations  AACE/ADA: New Consensus Statement on Inpatient Glycemic Control (2015)  Target Ranges:  Prepandial:   less than 140 mg/dL      Peak postprandial:   less than 180 mg/dL (1-2 hours)      Critically ill patients:  140 - 180 mg/dL  Results for Allison [name], Allison [name] (MRN [id number]) as of May 02, 2016 08:49  Ref. Range 04/28/2016 09:26 02-May-2016 08:27  Glucose-Capillary Latest Ref Range: 65 - 99 mg/dL 274 (H) 385 (H)    Review of Glycemic Control  Current orders for Inpatient glycemic control: Lantus 15 units QHS, Novolog 0-15 units TID with meals, Novolog 0-5 units QHS  Inpatient Diabetes Program Recommendations: Insulin - Basal: Fasting glucose 385 mg/dl today. If steroids are continued, please consider increasing Lantus to 24 units QHS (based on 80 kg x 0.3 units). Correction (SSI): Please consider increasing Novolog correction to Resistant scale and change frequency to Q4H since patient is NPO.   Thanks, Barnie Alderman, RN, MSN, CDE Diabetes Coordinator Inpatient Diabetes Program [phone] (Team Pager from [place] to [place]) [phone] ([facility] office) [phone] [facility] office) [phone] [facility] office)

## 2016-05-07 NOTE — Care Management (Signed)
Patient transferred per family request on this date to ICU [facility] from [facility] where she has been since 10/19 per CSW note. RNCM will continue to follow.

## 2016-05-07 NOTE — H&P (Signed)
PULMONARY / CRITICAL CARE MEDICINE   Name: Allison [name] MRN: [id number] DOB: 1950-08-07    ADMISSION DATE:  May 13, 2016 CONSULTATION DATE:  04/29/13 REFERRING MD : Dr. 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 Noted to have afib with RVR on Cardizem, minimally responsive on the Bipap Patient was recently admitted to [facility] for deconditioning and weakness currently living at [place] home comes in with 2 day history of shortness of breath and a productive cough  Upon arrival to [facility] ICU unit, patient noted to be lethargic on responding to sternal rub, no purposeful movements noted. BiPAP settings include 18/5/80%, with a saturation of 89%, saturation increase to 90-92 percent once FiO2 was increased to 100%. Review of chart shows that she had a PFT done in January 2017 had a FEV1of 36%.  Family including son,  Husband, grandchildren at the arrival room. Family conference held with son and Husband, informed them of the critical condition of the patient along with her poor prognosis, son stated that she is a DO NOT RESUSCITATE/DO NOT INTUBATE, and instituted comfort care measures.    SIGNIFICANT EVENTS   10/17-10/19>noted to have LE weakness  during rad/onc visit, sent to [facility],noted with right foot drop, placed on high dose steroids with improvement.  Offnote, had [place] radiotherapy with Dr. Tammi Klippel on 9/28 9/19-9/21>right arm and leg weakness, admitted to [facility], noted to have Afib w/RVR, stared on decadron and discharged with improvement.  03/07/16> cardiac cath due to unstable angina, 1. Single vessel CAD with patent stent in the mid Circumflex with mild restenosis, non flow limiting. 2. Mild stenosis in the LAD and RCA 3. Moderate LV systolic dysfunction  PAST MEDICAL HISTORY    :  Past Medical History:  Diagnosis Date  . A-fib ([place])    a. Dx 01/2014 - rate controlled. b. Recurrence 03/2014 in setting of renal cell carcinoma surgery.  . Acute pyelonephritis 01/28/2014  . Anxiety   . Carotid disease, bilateral ([place])    a. 11/2011 U/S: RICA 123XX123, LICA [id number].  Marland Kitchen COPD (chronic obstructive pulmonary disease) ([place])    uses O2 at night  . Coronary artery disease    a. 08/2009 NSTEMI s/p PCI/DES to LCX. Mod residual LAD dzs;  b. 02/2014 Nonischemic myoview.  Marland Kitchen Dysrhythmia    A-Fib  . History of kidney stones   . Hyperlipidemia   . Hypertension   . Ischemic cardiomyopathy    a. Echo 08/14/09 with LVEF 40-45%;  b. 01/2014 Echo: EF 55-60%, basal-mid inflat and inf HK, triv MR/TR, mod dil LA/RA.  Marland Kitchen Lung nodule    a. 1cm RLL nodule - followed by pulmonology.  . Metastatic renal cell carcinoma to lung ([place]) 01/29/2014  . Pneumonia    hx of  . PVD (peripheral vascular disease) ([place])    a. 11/2010 L Fem to above knee Pop bypass;  b. 12/2011 Angio: patent L fem->pop graft, LCFZ 50-60%.  . Shortness of breath dyspnea    with exertion  . Steroid-induced diabetes ([place]) 04/22/2016  . Tobacco abuse    hx of   Past Surgical History:  Procedure Laterality Date  . CARDIAC CATHETERIZATION    . CARDIAC CATHETERIZATION N/A 03/07/2016   Procedure: Left Heart Cath and Coronary Angiography;  Surgeon: Burnell Blanks, MD;  Location: [place] [facility];   Service: Cardiovascular;  Laterality: N/A;  . CARDIOVERSION N/A 05/08/2014   Procedure: CARDIOVERSION;  Surgeon: Thayer Headings, MD;  Location: [place];  Service: Cardiovascular;  Laterality: N/A;  . CORONARY STENT PLACEMENT  2011  . FEMORAL BYPASS Left May 2012  . INCISIONAL HERNIA REPAIR N/A 10/01/2015   Procedure: INCISIONAL HERNIA REPAIR;  Surgeon: Ralene Ok, MD;  Location: [place];  Service: General;  Laterality: N/A;  . INSERTION OF MESH N/A 10/01/2015   Procedure: INSERTION OF MESH;  Surgeon: Ralene Ok, MD;  Location: [place];  Service: General;  Laterality: N/A;  . LOWER EXTREMITY ANGIOGRAM  December 16, 2011  . LOWER EXTREMITY ANGIOGRAM N/A 12/16/2011   Procedure: LOWER EXTREMITY ANGIOGRAM;  Surgeon: Serafina Mitchell, MD;  Location: [facility] CATH LAB;  Service: Cardiovascular;  Laterality: N/A;  . ROBOT ASSISTED LAPAROSCOPIC NEPHRECTOMY Left 03/22/2014   Procedure: ROBOTIC ASSISTED LAPAROSCOPIC RADICAL NEPHRECTOMY LEFT;  Surgeon: Alexis Frock, MD;  Location: [facility];  Service: Urology;  Laterality: Left;  . TONSILLECTOMY  65 years old   Prior to Admission medications   Medication Sig Start Date End Date Taking? Authorizing Provider  albuterol (PROVENTIL HFA;VENTOLIN HFA) 108 (90 Base) MCG/ACT inhaler Inhale 2 puffs into the lungs every 6 (six) hours as needed for wheezing or shortness of breath (shortness of breath). 07/26/15   Tammy S Parrett, NP  apixaban (ELIQUIS) 5 MG TABS tablet Take 1 tablet (5 mg total) by mouth 2 (two) times daily. 01/24/16   Burnell Blanks, MD  atorvastatin (LIPITOR) 10 MG tablet Take 1 tablet (10 mg total) by mouth daily at 6 PM. 04/24/16   Patrecia Pour, MD  dexamethasone (DECADRON) 4 MG tablet Take 1 tablet (4 mg total) by mouth every 8 (eight) hours. 04/24/16   Patrecia Pour, MD  diltiazem (CARDIZEM CD) 180 MG 24 hr capsule Take 1 capsule (180 mg total) by mouth daily. 04/24/16   Patrecia Pour, MD  diphenhydramine-acetaminophen (TYLENOL PM) 25-500 MG  TABS Take 2 tablets by mouth daily as needed (for sleep).     Historical Provider, MD  escitalopram (LEXAPRO) 20 MG tablet Take 20 mg by mouth at bedtime.    Historical Provider, MD  Fluticasone Furoate (ARNUITY ELLIPTA) 100 MCG/ACT AEPB Inhale 1 puff into the lungs daily. Patient taking differently: Inhale 1 puff into the lungs every morning.  03/28/16   Brand Males, MD  furosemide (LASIX) 20 MG tablet TAKE 1 TABLET EVERY MORNING. Patient taking differently: TAKE 20 MG BY MOUTH EVERY MORNING 03/04/16   Burnell Blanks, MD  guaiFENesin (MUCINEX) 600 MG 12 hr tablet Take 600 mg by mouth 2 (two) times daily.    Historical Provider, MD  insulin aspart (NOVOLOG) 100 UNIT/ML injection Inject 0-15 Units into the skin 3 (three) times daily with meals. 04/24/16   Patrecia Pour, MD  insulin glargine (LANTUS) 100 UNIT/ML injection Inject 0.15 mLs (15 Units total) into the skin at bedtime. 04/24/16   Patrecia Pour, MD  LORazepam (ATIVAN) 0.5 MG tablet Take 0.5  mg by mouth daily.    Historical Provider, MD  metoprolol (LOPRESSOR) 100 MG tablet Take 1 tablet (100 mg total) by mouth 2 (two) times daily. 04/24/16   Patrecia Pour, MD  potassium chloride (K-DUR,KLOR-CON) 10 MEQ tablet Take 10 mEq by mouth every morning.     Historical Provider, MD  senna-docusate (SENOKOT-S) 8.6-50 MG tablet Take 1 tablet by mouth 2 (two) times daily. 03/26/16   Wyatt Portela, MD  Tiotropium Bromide-Olodaterol (STIOLTO RESPIMAT) 2.5-2.5 MCG/ACT AERS Inhale 2 puffs into the lungs daily. Patient taking differently: Inhale 2 puffs into the lungs every morning.  03/04/16   Brand Males, MD   Allergies  Allergen Reactions  . Ace Inhibitors Cough  . Amoxicillin-Pot Clavulanate Hives  . Codeine Nausea And Vomiting     FAMILY HISTORY   Family History  Problem Relation Age of Onset  . Coronary artery disease Mother     deceased. coronary artery bypass graft  . Cancer Mother     BREAST  . Heart disease Mother     PVD   - Amputation-Leg  . Hyperlipidemia Mother   . Hypertension Mother   . Heart attack Mother   . Cirrhosis Father     deceased  . Cancer Father   . Other Brother     alive, unknown hx  . Stroke Maternal Aunt       SOCIAL HISTORY    reports that she quit smoking about 6 years ago. Her smoking use included Cigarettes. She has a 45.00 pack-year smoking history. She has never used smokeless tobacco. She reports that she does not drink alcohol or use drugs.  Review of Systems  Unable to perform ROS: Critical illness      VITAL SIGNS    Temp:  [96.7 F (35.9 C)-99.3 F (37.4 C)] 99.3 F (37.4 C) (10/24 1705) Pulse Rate:  [46-145] 131 (10/24 1500) Resp:  [22-48] 35 (10/24 1500) BP: (110-189)/(68-169) 163/98 (10/24 1500) SpO2:  [88 %-99 %] 92 % (10/24 1500) FiO2 (%):  [80 %] 80 % (10/24 0828) Weight:  [178 lb 5.6 oz (80.9 kg)-179 lb 14.3 oz (81.6 kg)] 178 lb 5.6 oz (80.9 kg) (10/24 0500) HEMODYNAMICS:   VENTILATOR SETTINGS: FiO2 (%):  [80 %] 80 % INTAKE / OUTPUT: No intake or output data in the 24 hours ending 05-May-2016 1715     PHYSICAL EXAM   Physical Exam  Constitutional: She appears well-developed.  HENT:  Head: Normocephalic and atraumatic.  Right Ear: External ear normal.  Eyes: Right eye exhibits no discharge. Left eye exhibits no discharge.  Neck: No thyromegaly present.  Cardiovascular:  No murmur heard. S1,s2, tachycardia, irregular Decreased pedal pulses  Pulmonary/Chest: She is in respiratory distress. She has no wheezes.  On bipap - rapid breathing, shallow, no wheezes  Abdominal: Soft. Bowel sounds are normal. There is no tenderness. There is no rebound.  Neurological:  On Bipap - responds to painful stimuli - sternal rub. No purposeful movements  Skin:  cool  Nursing note and vitals reviewed.      LABS   LABS:  CBC  Recent Labs Lab 04/24/16 0553 04/28/16 0924 2016/05/05 0929  WBC 9.0 13.6* 14.7*  HGB 12.4 13.6 12.9  HCT 37.4 40.0  38.7  PLT 164 248 183   Coag's No results for input(s): APTT, INR in the last 168 hours. BMET  Recent Labs Lab 04/24/16 0553 04/28/16 0924 05-May-2016 0929  NA 136 134* 131*  K 4.4 4.5 4.2  CL 105  101 100*  CO2 23 25 21*  BUN 35* 20 33*  CREATININE 0.72 0.74 1.10*  GLUCOSE 236* 281* 462*   Electrolytes  Recent Labs Lab 04/24/16 0553 04/28/16 0924 2016-05-29 0929  CALCIUM 8.8* 9.2 9.0  MG 2.1  --   --    Sepsis Markers  Recent Labs Lab 04/28/16 1108  LATICACIDVEN 1.68   ABG  Recent Labs Lab 05/29/16 0055 05/29/2016 0840 05/29/16 1400  PHART 7.325* 7.365 7.367  PCO2ART 42.4 40.9 41.5  PO2ART 78.3* 72.2* 66.8*   Liver Enzymes  Recent Labs Lab 04/23/16 0519  AST 16  ALT 56*  ALKPHOS 83  BILITOT 1.1  ALBUMIN 2.7*   Cardiac Enzymes No results for input(s): TROPONINI, PROBNP in the last 168 hours. Glucose  Recent Labs Lab 04/24/16 1154 04/28/16 0926 05-29-16 0827 05/29/2016 1129 05/29/16 1318 2016-05-29 1415  GLUCAP 325* 274* 385* 430* 375* 358*     Recent Results (from the past 240 hour(s))  Culture, blood (Routine X 2) w Reflex to ID Panel     Status: None (Preliminary result)   Collection Time: 04/28/16 10:48 AM  Result Value Ref Range Status   Specimen Description BLOOD RIGHT HAND  Final   Special Requests BOTTLES DRAWN AEROBIC AND ANAEROBIC 6CC  Final   Culture NO GROWTH < 24 HOURS  Final   Report Status PENDING  Incomplete  Culture, blood (Routine X 2) w Reflex to ID Panel     Status: None (Preliminary result)   Collection Time: 04/28/16 10:50 AM  Result Value Ref Range Status   Specimen Description BLOOD LEFT HAND  Final   Special Requests BOTTLES DRAWN AEROBIC ONLY 4CC  Final   Culture NO GROWTH < 24 HOURS  Final   Report Status PENDING  Incomplete  MRSA PCR Screening     Status: None   Collection Time: 2016/05/29 12:49 AM  Result Value Ref Range Status   MRSA by PCR NEGATIVE NEGATIVE Final    Comment:        The GeneXpert MRSA Assay  (FDA approved for NASAL specimens only), is one component of a comprehensive MRSA colonization surveillance program. It is not intended to diagnose MRSA infection nor to guide or monitor treatment for MRSA infections.      Current Facility-Administered Medications:  .  0.9 %  sodium chloride infusion, 250 mL, Intravenous, PRN, Nyles Mitton, MD .  LORazepam (ATIVAN) 50 mg in dextrose 5 % 50 mL (1 mg/mL) infusion, 5 mg/hr, Intravenous, Continuous, Alaster Asfaw, MD .  LORazepam (ATIVAN) bolus via infusion 2-5 mg, 2-5 mg, Intravenous, Q15 min PRN, Gini Caputo, MD .  morphine 100mg  in NS 176mL (1mg /mL) infusion - premix, 10 mg/hr, Intravenous, Continuous, Maico Mulvehill, MD .  morphine bolus via infusion 5-20 mg, 5-20 mg, Intravenous, Q15 min PRN, Kentley Cedillo, MD .  ondansetron (ZOFRAN) injection 4 mg, 4 mg, Intravenous, Q6H, Vilinda Boehringer, MD  IMAGING    Dg Chest Port 1 View  Result Date: 2016-05-29 CLINICAL DATA:  Left-sided renal cell carcinoma with lung and brain metastasis. Respiratory insufficiency. EXAM: PORTABLE CHEST 1 VIEW COMPARISON:  04/28/2016 FINDINGS: Midline trachea. Mild cardiomegaly. Atherosclerosis in the transverse aorta. Possible small right pleural effusion. No pneumothorax. Multifocal pulmonary opacities are primarily similar. There may be slight increase in right lower lobe opacification. No overt congestive failure. IMPRESSION: Multifocal pulmonary opacities, primarily felt to represent metastatic disease. Possible increasing superimposed right lower lobe pneumonia. Cardiomegaly without congestive failure. Possible small right pleural effusion. Electronically Signed  By: Abigail Miyamoto M.D.   On: May 04, 2016 07:50      Indwelling Urinary Catheter continued, requirement due to   Reason to continue Indwelling Urinary Catheter for strict Intake/Output monitoring for hemodynamic instability   Central Line continued, requirement due to   Reason to continue  [employer] Monitoring of central venous pressure or other hemodynamic parameters   Ventilator continued, requirement due to, resp failure    Ventilator Sedation RASS 0 to -2   Cultures: BCx2  UC  Sputum  Antibiotics:  Lines:   ASSESSMENT/PLAN  66 yo female History of renal cell a with metastasis to the brain and lung now with acute hypoxic respiratory failure multilobar  Pneumonia,altered mental status   Acute on Chronic Hypoxic Respiratory Failure Mulitlobar pneumonia Pulmonary opacities - most likely mets to the lungs Brain Metastasis - secondary to RCC  Renal Cell Ca s/p left nephrectomy Acute Renal Failure Chronic Hematuria COPD on 2L Steroid induced hyperglycemia Afib w/RVR Combined systolic and diastolic CHF - EF [id number] Toxic encephlopathy  Plan: Upon arrival to [facility] ICU family members were present including son and husband, patient with known renal cell carcinoma with metastasis to the brain and to the lungs now with acute hypoxic respiratory failure along with multilobar pneumonia, hyperglycemia, hyponatremia, afib with RVR, requiring high amounts of FiO2 and not responding well. Patient has had palliative brain radiation treatments, but continues to have neurologic symptoms which include right-sided weakness and right foot drop. Patient recently admitted to [facility] with weakness and worsening respiratory function overnight. Patient has a poor prognosis, given her severely decrease mentation, poor pulmonary status, metastasis to the brain and lung, and high FiO2 requirement.  Again, prognosis which is poor discussed with family members who verbalized understanding and made patient DNR/DNI and instituted comfort care measures.  - comfort orders placed  RN Witness - Myra Flowers.    I have personally obtained a history, examined the patient, evaluated laboratory and imaging results, formulated the assessment and plan and placed orders.  The Patient requires  high complexity decision making for assessment and support, frequent evaluation and titration of therapies, application of advanced monitoring technologies and extensive interpretation of multiple databases. Critical Care Time devoted to patient care services described in this note is 45 minutes.   Overall, patient is critically ill, prognosis is guarded. Patient at high risk for cardiac arrest and death.   Vilinda Boehringer, MD [place] [facility] Pager [phone] (please enter 7-digits) On Call Pager [phone] (please enter 7-digits)     2016-05-04, 5:15 PM  Note: This note was prepared with Dragon dictation along with smaller phrase technology. Any transcriptional errors that result from this process are unintentional.

## 2016-05-07 NOTE — Consult Note (Signed)
Consultation Note Date: 04-May-2016   Patient Name: Allison [name]  DOB: [birth year]  MRN: [id number]  Age / Sex: 65 y.o., female  PCP: [facility] Referring Physician: Thurnell Lose, MD  Reason for Consultation: Establishing goals of care  HPI/Patient Profile: 65 y.o. 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   Clinical Assessment and Goals of Care: Allison [name] is resting quietly in bed, BiPAP in place.  She opens her eyes and makes eye contact when I speak her name. Present today at bedside or husband Allison [name], son Allison [name], and Allison [name] girlfriend Allison [name]. We go to my office for a family meeting. We review Allison [name] is chronic and acute health problems. We review labs and images. We talk about radiation treatment to her brain, the first and only treatment of directed radiation was September 28. Husband states that his understanding is that radiation is curative, I share with him that this is Allison [name]'s brain radiation was palliative.  Family states several times that Allison [name] has had medications changes at every facility that affecter her negatively.  We also review medications both in hospital and at home.   We talk about Allison [name]'s functional status. They share that prior to her radiation she was "fine". That she was able to bathe herself, but would rarely cook, and had not driven on the road and over one year. They state that she was not getting  the rehab she needed at the [facility], and they feel that this has caused pneumonia. We talk about the sequela of lung cancer.   We talk about about HCPOA (see below), and Advanced directives.  Allison [name] states that Allison [name] had been intubated in the past. See below. I share the chronic illness trajectory for cancer.   I share that the palliative medicine team will continue to be available to answer questions and provide support.    Healthcare power of attorney HCPOA - husband Allison [name] and son Allison [name] share power of attorney. Allison [name] states they make decisions together.   SUMMARY OF RECOMMENDATIONS   Continue to treat the treatable including full scope treatment such as CPR or intubation if needed.   Code Status/Advance Care Planning: Full code - I ask if Allison [name] has ever talked about what she did and did not want. Allison [name] states, "she wants to live".  I reframe my question, asking if their family had ever talked about intubation/ventilation, living in a nursing home, quality of life.  Son Allison [name] states that Allison [name] would want intubation and ventilation "as long as the outcome is what she  wanted".  We talk about "trial periods".  I encourage family to keep Allison [name] at the center of their decision-making, asking if interventions will change what's happening, such as her cancer, COPD, or heart problems.  We talk about chest compressions if her heart were to naturally stop. Allison [name] states that they want the "30 minute code protocol".   Symptom Management:   per hospitalist  Palliative Prophylaxis:   Aspiration and Turn Reposition  Additional Recommendations (Limitations, Scope, Preferences):  Full Scope Treatment  Psycho-social/Spiritual:   Desire for further Chaplaincy support:no  Additional Recommendations: Caregiving  Support/Resources and ICU Family Guide  Prognosis:   Unable to determine, based on outcomes. Worst case scenario 2 weeks or less. Best case scenario  likely 3 to 6 months, based on recent functional decline.  Discharge Planning: To Be Determined      Primary Diagnoses: Present on Admission: . Acute respiratory failure with hypoxia ([place]) . Atrial fibrillation with RVR ([place]) . Brain metastases ([place]) . COPD, severe - MS phenotype . Metastatic renal cell carcinoma to lung ([place]) . Ischemic cardiomyopathy . PAD (peripheral artery disease) ([place]) . Peripheral vascular disease ([place]) . Right foot drop . Multiple lung nodules . Leg weakness . Hyperlipidemia . Hematuria, gross . Acute and chr resp failure, unsp w hypoxia or hypercapnia ([place])   I have reviewed the medical record, interviewed the patient and family, and examined the patient. The following aspects are pertinent.  Past Medical History:  Diagnosis Date  . A-fib ([place])    a. Dx 01/2014 - rate controlled. b. Recurrence 03/2014 in setting of renal cell carcinoma surgery.  . Acute pyelonephritis 01/28/2014  . Anxiety   . Carotid disease, bilateral ([place])    a. 11/2011 U/S: RICA 123XX123, LICA [id number].  Marland Kitchen COPD (chronic obstructive pulmonary disease) ([place])    uses O2 at night  . Coronary artery disease    a. 08/2009 NSTEMI s/p PCI/DES to LCX. Mod residual LAD dzs;  b. 02/2014 Nonischemic myoview.  Marland Kitchen Dysrhythmia    A-Fib  . History of kidney stones   . Hyperlipidemia   . Hypertension   . Ischemic cardiomyopathy    a. Echo 08/14/09 with LVEF 40-45%;  b. 01/2014 Echo: EF 55-60%, basal-mid inflat and inf HK, triv MR/TR, mod dil LA/RA.  Marland Kitchen Lung nodule    a. 1cm RLL nodule - followed by pulmonology.  . Metastatic renal cell carcinoma to lung ([place]) 01/29/2014  . Pneumonia    hx of  . PVD (peripheral vascular disease) ([place])    a. 11/2010 L Fem to above knee Pop bypass;  b. 12/2011 Angio: patent L fem->pop graft, LCFZ 50-60%.  . Shortness of breath dyspnea    with exertion  . 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 Tobacco abuse    hx of   Social History   Social History  . Marital  status: Married    Spouse name: N/A  . Number of children: 3  . Years of education: N/A   Occupational History  . sculptor Unemployed   Social History Main Topics  . Smoking status: Former Smoker    Packs/day: 1.50    Years: 30.00    Types: Cigarettes    Quit date: 08/12/2009  . Smokeless tobacco: Never Used     Comment: she has smoker 1-2 packs daily since the age of 53. 72 pack years  . Alcohol use No  . Drug use: No  . Sexual activity: Not Currently   Other Topics Concern  .  None   Social History Narrative  . None   Family History  Problem Relation Age of Onset  . Coronary artery disease Mother     deceased. coronary artery bypass graft  . Cancer Mother     BREAST  . Heart disease Mother     PVD  - Amputation-Leg  . Hyperlipidemia Mother   . Hypertension Mother   . Heart attack Mother   . Cirrhosis Father     deceased  . Cancer Father   . Other Brother     alive, unknown hx  . Stroke Maternal Aunt    Scheduled Meds: . apixaban  5 mg Oral BID  . atorvastatin  10 mg Oral q1800  . budesonide (PULMICORT) nebulizer solution  0.25 mg Nebulization BID  . ceFEPime (MAXIPIME) IV  1 g Intravenous Q8H  . dexamethasone  8 mg Intravenous Q12H  . escitalopram  20 mg Oral QHS  . furosemide  40 mg Intravenous BID  . guaiFENesin  600 mg Oral BID  . insulin glargine  10 Units Subcutaneous Once  . insulin glargine  15 Units Subcutaneous BID  . insulin regular  0-10 Units Intravenous TID WC  . LORazepam  0.5 mg Oral Daily  . metoprolol  100 mg Oral BID  . nitroGLYCERIN  0.5 inch Topical Q6H  . polyethylene glycol  17 g Oral BID  . potassium chloride  10 mEq Oral Daily  . senna-docusate  1 tablet Oral BID  . vancomycin  1,000 mg Intravenous Q12H   Continuous Infusions: . sodium chloride    . diltiazem (CARDIZEM) infusion 12.5 mg/hr (05-17-2016 0950)  . insulin (NOVOLIN-R) infusion     PRN Meds:.acetaminophen, albuterol, dextrose, diphenhydrAMINE, fentaNYL (SUBLIMAZE)  injection, HYDROcodone-acetaminophen, metoprolol, ondansetron (ZOFRAN) IV Medications Prior to Admission:  Prior to Admission medications   Medication Sig Start Date End Date Taking? Authorizing Provider  albuterol (PROVENTIL HFA;VENTOLIN HFA) 108 (90 Base) MCG/ACT inhaler Inhale 2 puffs into the lungs every 6 (six) hours as needed for wheezing or shortness of breath (shortness of breath). 07/26/15  Yes Tammy S Parrett, NP  apixaban (ELIQUIS) 5 MG TABS tablet Take 1 tablet (5 mg total) by mouth 2 (two) times daily. 01/24/16  Yes Burnell Blanks, MD  atorvastatin (LIPITOR) 10 MG tablet Take 1 tablet (10 mg total) by mouth daily at 6 PM. 04/24/16  Yes Patrecia Pour, MD  dexamethasone (DECADRON) 4 MG tablet Take 1 tablet (4 mg total) by mouth every 8 (eight) hours. 04/24/16  Yes Patrecia Pour, MD  diltiazem (CARDIZEM CD) 180 MG 24 hr capsule Take 1 capsule (180 mg total) by mouth daily. 04/24/16  Yes Patrecia Pour, MD  diphenhydramine-acetaminophen (TYLENOL PM) 25-500 MG TABS Take 2 tablets by mouth daily as needed (for sleep).    Yes Historical Provider, MD  escitalopram (LEXAPRO) 20 MG tablet Take 20 mg by mouth at bedtime.   Yes Historical Provider, MD  Fluticasone Furoate (ARNUITY ELLIPTA) 100 MCG/ACT AEPB Inhale 1 puff into the lungs daily. Patient taking differently: Inhale 1 puff into the lungs every morning.  03/28/16  Yes Brand Males, MD  furosemide (LASIX) 20 MG tablet TAKE 1 TABLET EVERY MORNING. Patient taking differently: TAKE 20 MG BY MOUTH EVERY MORNING 03/04/16  Yes Burnell Blanks, MD  guaiFENesin (MUCINEX) 600 MG 12 hr tablet Take 600 mg by mouth 2 (two) times daily.   Yes Historical Provider, MD  insulin aspart (NOVOLOG) 100 UNIT/ML injection Inject 0-15 Units into the  skin 3 (three) times daily with meals. 04/24/16  Yes Patrecia Pour, MD  insulin glargine (LANTUS) 100 UNIT/ML injection Inject 0.15 mLs (15 Units total) into the skin at bedtime. 04/24/16  Yes Patrecia Pour,  MD  LORazepam (ATIVAN) 0.5 MG tablet Take 0.5 mg by mouth daily.   Yes Historical Provider, MD  metoprolol (LOPRESSOR) 100 MG tablet Take 1 tablet (100 mg total) by mouth 2 (two) times daily. 04/24/16  Yes Patrecia Pour, MD  potassium chloride (K-DUR,KLOR-CON) 10 MEQ tablet Take 10 mEq by mouth every morning.    Yes Historical Provider, MD  senna-docusate (SENOKOT-S) 8.6-50 MG tablet Take 1 tablet by mouth 2 (two) times daily. 03/26/16  Yes Wyatt Portela, MD  Tiotropium Bromide-Olodaterol (STIOLTO RESPIMAT) 2.5-2.5 MCG/ACT AERS Inhale 2 puffs into the lungs daily. Patient taking differently: Inhale 2 puffs into the lungs every morning.  03/04/16  Yes Brand Males, MD   Allergies  Allergen Reactions  . Ace Inhibitors Cough  . Amoxicillin-Pot Clavulanate Hives  . Codeine Nausea And Vomiting   Review of Systems  Unable to perform ROS: Acuity of condition    Physical Exam  Constitutional: No distress.  Chronically ill appearing, frail. BiPAP, opens eyes to command.  HENT:  Head: Normocephalic and atraumatic.  Cardiovascular:  A fib, 1 teens  Pulmonary/Chest:  BiPAP at 80% FiO2  Neurological:  Opens eyes to command, follows directions  Skin: Skin is warm and dry.    Vital Signs: BP 123/72   Pulse (!) 104   Temp (!) 96.7 F (35.9 C) (Axillary)   Resp (!) 26   Ht 5\' 5"  (1.651 m)   Wt 80.9 kg (178 lb 5.6 oz)   SpO2 95%   BMI 29.68 kg/m  Pain Assessment: PAINAD   Pain Score: 0-No pain   SpO2: SpO2: 95 % O2 Device:SpO2: 95 % O2 Flow Rate: .O2 Flow Rate (L/min): 15 L/min  IO: Intake/output summary:  Intake/Output Summary (Last 24 hours) at 05/21/16 1249 Last data filed at May 21, 2016 [id number]  Gross per 24 hour  Intake          1153.33 ml  Output              600 ml  Net           553.33 ml    LBM:   Baseline Weight: Weight: 82.6 kg (182 lb) Most recent weight: Weight: 80.9 kg (178 lb 5.6 oz)     Palliative Assessment/Data:   Flowsheet Rows   Flowsheet Row Most  Recent Value  Intake Tab  Referral Department  Hospitalist  Unit at Time of Referral  Med/Surg Unit  Palliative Care Primary Diagnosis  Cancer  Date Notified  04/28/16  Palliative Care Type  New Palliative care  Reason for referral  Advance Care Planning  Date of Admission  04/28/16  Date first seen by Palliative Care  04/28/16  # of days Palliative referral response time  0 Day(s)  # of days IP prior to Palliative referral  0  Clinical Assessment  Palliative Performance Scale Score  50%  Pain Max last 24 hours  Not able to report  Pain Min Last 24 hours  Not able to report  Dyspnea Max Last 24 Hours  Not able to report  Dyspnea Min Last 24 hours  Not able to report  Psychosocial & Spiritual Assessment  Palliative Care Outcomes  Patient/Family meeting held?  Yes  Who was at the meeting?  Patient only today,  scheduled appointment with husband via phone.  Palliative Care Outcomes  Provided psychosocial or spiritual support  Palliative Care follow-up planned  -- [Follow-up while at [facility]]      Time In: 1100 Time Out: 1220 Time Total: 80 Greater than 50%  of this time was spent counseling and coordinating care related to the above assessment and plan.  Signed by: Drue Novel, NP   Please contact Palliative Medicine Team phone at [phone] for questions and concerns.  For individual provider: See Shea Evans

## 2016-05-07 NOTE — Progress Notes (Signed)
Called by RN to assess patient for worsening respiratory status. Patient has history of left-sided renal cell cancer with known metastasis to brain and lungs, CAD, chronic combined systolic and diastolic heart failure with EF 40%. Currently she has been admitted for multilobar pneumonia.  On exam patient is tachypnea, bilateral crackles auscultated in both lungs  Assessment Acute on chronic hypoxic respiratory failure- multifactorial, with underlying multilobar pneumonia, she also has a history of CHF. Received 1 dose of 40 mg IV Lasix earlier. Will insert Foley catheter, transfer to stepdown unit, will place on BiPAP. Check ABG, BNP. Will follow

## 2016-05-07 NOTE — Progress Notes (Signed)
Patient expired at 2240. 2 nurses verified, myself and Everitt Amber, RN. Jerl Santos, NP notified. [place] donors notified. CCMD and Elink aware. Family present and aware of situation. Wilnette Kales

## 2016-05-07 NOTE — Progress Notes (Signed)
No spontaneous purposeful movement

## 2016-05-07 NOTE — Progress Notes (Signed)
1800 Family request removal of BiPAP.

## 2016-05-07 NOTE — Clinical Social Work Note (Signed)
Clinical Social Work Assessment  Patient Details  Name: Allison [name] MRN: [id number] Date of Birth: 1951-06-23  Date of referral:  05/17/16               Reason for consult:  Facility Placement                Permission sought to share information with:    Permission granted to share information::     Name::        Agency::     Relationship::     Contact Information:  Vickie at [facility] and spouse, Mr. Soria  Housing/Transportation Living arrangements for the past 2 months:  Single Family Home Source of Information:  Spouse, Facility Patient Interpreter Needed:  None Criminal Activity/Legal Involvement Pertinent to Current Situation/Hospitalization:  No - Comment as needed Significant Relationships:  Adult Children, Spouse Lives with:  Adult Children, Spouse Do you feel safe going back to the place where you live?  Yes Need for family participation in patient care:  Yes (Comment)  Care giving concerns:  None identified.    Social Worker assessment / plan: Vickie at [facility] advised that patient has been at the facility since 10/19. Patient uses a wheelchair, receives assistance from staff from [employer] and feeds herself. She stated that patient can return. Mr. Belfiore confirmed statements. He stated that CSW needed to speak with ICU abut the discharge plan. CSW spoke with patient's nurse who advised that patient was being transferred to [facility] at family's request. CSW signing off.  Employment status:  Disabled (Comment on whether or not currently receiving Disability) Insurance information:  Medicaid In [place] PT Recommendations:  [place] / Referral to community resources:     Patient/Family's Response to care:  At family's request, patient is being transferred to [facility].   Patient/Family's Understanding of and Emotional Response to Diagnosis, Current Treatment, and Prognosis: Patient's family  understands patient's diagnosis, treatment and prognosis.   Emotional Assessment Appearance:  Appears stated age Attitude/Demeanor/Rapport:  Unable to Assess Affect (typically observed):  Unable to Assess Orientation:  Oriented to Self, Oriented to Place Alcohol / Substance use:  Not Applicable Psych involvement (Current and /or in the community):  No (Comment)  Discharge Needs  Concerns to be addressed:  Discharge Planning Concerns Readmission within the last 30 days:  Yes Current discharge risk:  Abuse, Chronically ill Barriers to Discharge:  No Barriers Identified   Ihor Gully, LCSW 05/17/2016, 1:37 PM

## 2016-05-07 DEATH — deceased

## 2016-05-19 ENCOUNTER — Ambulatory Visit: Payer: Self-pay | Admitting: Radiation Oncology

## 2016-05-26 ENCOUNTER — Ambulatory Visit: Payer: Self-pay | Admitting: Radiation Oncology

## 2016-06-03 ENCOUNTER — Other Ambulatory Visit: Payer: Medicaid Other

## 2016-06-04 ENCOUNTER — Ambulatory Visit: Payer: Medicaid Other | Admitting: Internal Medicine

## 2016-06-05 ENCOUNTER — Ambulatory Visit: Payer: Medicaid Other | Admitting: Oncology

## 2016-07-07 NOTE — ED Provider Notes (Signed)
[facility] DEPT Provider Note   CSN: [id number] Arrival date & time: 04/28/16  0909     History   Chief Complaint Chief Complaint  Patient presents with  . Weakness    HPI Allison [name] is a 66 y.o. female.  HPI Per EMS: Pt from [place], having generalized weakness for past couple days, pt 02 sats at [facility] 88% on 2L.  Pt currently on 4L 02 [place] with EMS, 02 sats 95%.  Pt is wheelchair bound, has brain cancer.  Past Medical History:  Diagnosis Date  . A-fib ([place])    a. Dx 01/2014 - rate controlled. b. Recurrence 03/2014 in setting of renal cell carcinoma surgery.  . Acute pyelonephritis 01/28/2014  . Anxiety   . Carotid disease, bilateral ([place])    a. 11/2011 U/S: RICA 123XX123, LICA [id number].  Marland Kitchen COPD (chronic obstructive pulmonary disease) ([place])    uses O2 at night  . Coronary artery disease    a. 08/2009 NSTEMI s/p PCI/DES to LCX. Mod residual LAD dzs;  b. 02/2014 Nonischemic myoview.  Marland Kitchen Dysrhythmia    A-Fib  . History of kidney stones   . Hyperlipidemia   . Hypertension   . Ischemic cardiomyopathy    a. Echo 08/14/09 with LVEF 40-45%;  b. 01/2014 Echo: EF 55-60%, basal-mid inflat and inf HK, triv MR/TR, mod dil LA/RA.  Marland Kitchen Lung nodule    a. 1cm RLL nodule - followed by pulmonology.  . Metastatic renal cell carcinoma to lung ([place]) 01/29/2014  . Pneumonia    hx of  . PVD (peripheral vascular disease) ([place])    a. 11/2010 L Fem to above knee Pop bypass;  b. 12/2011 Angio: patent L fem->pop graft, LCFZ 50-60%.  . Shortness of breath dyspnea    with exertion  . 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 Tobacco abuse    hx of    Patient Active Problem List   Diagnosis Date Noted  . Respiratory failure, acute ([place]) 05/13/16  . Goals of care, counseling/discussion   . DNR (do not resuscitate) discussion   . Acute and chr resp failure, unsp w hypoxia or hypercapnia ([place]) 04/28/2016  . Palliative care encounter   . Brain tumor ([place]) 04/22/2016  .  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 Physical debility 04/22/2016  . Right foot drop 04/22/2016  . Current chronic use of systemic steroids 04/22/2016  . Leg weakness   . Brain metastases ([place]) 03/25/2016  . Atrial fibrillation with RVR ([place]) 03/25/2016  . COPD exacerbation ([place])   . S/P hernia repair 10/01/2015  . Hypoxia   . Shortness of breath   . Acute respiratory failure with hypoxia ([place])   . Chronic respiratory failure ([place]) 08/01/2015  . Preop respiratory exam 05/25/2015  . Dyspnea and respiratory abnormality 05/25/2015  . Hyperlipidemia 02/12/2015  . Multiple lung nodules 01/30/2015  . CKD (chronic kidney disease), stage III 04/21/2014  . Nodule of right lung 02/26/2014  . Nocturnal hypoxemia 02/07/2014  . Metastatic renal cell carcinoma to lung ([place]) 01/29/2014  . Hyponatremia 01/28/2014  . Hematuria, gross 01/28/2014  . Left renal mass 01/28/2014  . Paroxysmal atrial fibrillation ([place]) 01/28/2014  . Anemia due to blood loss 01/28/2014  . Atherosclerotic PVD with intermittent claudication ([place]) 11/28/2011  . Occlusion and stenosis of carotid artery without mention of cerebral infarction 11/28/2011  . Ischemic cardiomyopathy 09/02/2011  . PAD (peripheral artery disease) ([place]) 09/02/2011  . TOBACCO ABUSE 08/30/2009  . Coronary artery disease involving native coronary  artery of native heart with angina pectoris ([place]) 08/30/2009  . Peripheral vascular disease ([place]) 08/30/2009  . Essential hypertension 08/29/2009  . COPD, severe - MS phenotype 08/29/2009  . TOBACCO ABUSE, HX OF 08/29/2009    Past Surgical History:  Procedure Laterality Date  . CARDIAC CATHETERIZATION    . CARDIAC CATHETERIZATION N/A 03/07/2016   Procedure: Left Heart Cath and Coronary Angiography;  Surgeon: Burnell Blanks, MD;  Location: [facility];  Service: Cardiovascular;  Laterality: N/A;  . CARDIOVERSION N/A 05/08/2014   Procedure: CARDIOVERSION;  Surgeon: Thayer Headings,  MD;  Location: [facility];  Service: Cardiovascular;  Laterality: N/A;  . CORONARY STENT PLACEMENT  2011  . FEMORAL BYPASS Left May 2012  . INCISIONAL HERNIA REPAIR N/A 10/01/2015   Procedure: INCISIONAL HERNIA REPAIR;  Surgeon: Ralene Ok, MD;  Location: [facility];  Service: General;  Laterality: N/A;  . INSERTION OF MESH N/A 10/01/2015   Procedure: INSERTION OF MESH;  Surgeon: Ralene Ok, MD;  Location: [facility];  Service: General;  Laterality: N/A;  . LOWER EXTREMITY ANGIOGRAM  December 16, 2011  . LOWER EXTREMITY ANGIOGRAM N/A 12/16/2011   Procedure: LOWER EXTREMITY ANGIOGRAM;  Surgeon: Serafina Mitchell, MD;  Location: [facility] CATH LAB;  Service: Cardiovascular;  Laterality: N/A;  . ROBOT ASSISTED LAPAROSCOPIC NEPHRECTOMY Left 03/22/2014   Procedure: ROBOTIC ASSISTED LAPAROSCOPIC RADICAL NEPHRECTOMY LEFT;  Surgeon: Alexis Frock, MD;  Location: [facility];  Service: Urology;  Laterality: Left;  . TONSILLECTOMY  66 years old    OB History    No data available       Home Medications    Prior to Admission medications   Medication Sig Start Date End Date Taking? Authorizing Provider  albuterol (PROVENTIL HFA;VENTOLIN HFA) 108 (90 Base) MCG/ACT inhaler Inhale 2 puffs into the lungs every 6 (six) hours as needed for wheezing or shortness of breath (shortness of breath). 07/26/15  Yes Tammy S Parrett, NP  apixaban (ELIQUIS) 5 MG TABS tablet Take 1 tablet (5 mg total) by mouth 2 (two) times daily. 01/24/16  Yes Burnell Blanks, MD  atorvastatin (LIPITOR) 10 MG tablet Take 1 tablet (10 mg total) by mouth daily at 6 PM. 04/24/16  Yes Patrecia Pour, MD  dexamethasone (DECADRON) 4 MG tablet Take 1 tablet (4 mg total) by mouth every 8 (eight) hours. 04/24/16  Yes Patrecia Pour, MD  diltiazem (CARDIZEM CD) 180 MG 24 hr capsule Take 1 capsule (180 mg total) by mouth daily. 04/24/16  Yes Patrecia Pour, MD  diphenhydramine-acetaminophen (TYLENOL PM) 25-500 MG TABS Take 2 tablets by mouth daily as needed (for  sleep).    Yes Historical Provider, MD  escitalopram (LEXAPRO) 20 MG tablet Take 20 mg by mouth at bedtime.   Yes Historical Provider, MD  Fluticasone Furoate (ARNUITY ELLIPTA) 100 MCG/ACT AEPB Inhale 1 puff into the lungs daily. Patient taking differently: Inhale 1 puff into the lungs every morning.  03/28/16  Yes Brand Males, MD  furosemide (LASIX) 20 MG tablet TAKE 1 TABLET EVERY MORNING. Patient taking differently: TAKE 20 MG BY MOUTH EVERY MORNING 03/04/16  Yes Burnell Blanks, MD  guaiFENesin (MUCINEX) 600 MG 12 hr tablet Take 600 mg by mouth 2 (two) times daily.   Yes Historical Provider, MD  insulin aspart (NOVOLOG) 100 UNIT/ML injection Inject 0-15 Units into the skin 3 (three) times daily with meals. 04/24/16  Yes Patrecia Pour, MD  insulin glargine (LANTUS) 100 UNIT/ML injection Inject 0.15 mLs (15  Units total) into the skin at bedtime. 04/24/16  Yes Patrecia Pour, MD  LORazepam (ATIVAN) 0.5 MG tablet Take 0.5 mg by mouth daily.   Yes Historical Provider, MD  metoprolol (LOPRESSOR) 100 MG tablet Take 1 tablet (100 mg total) by mouth 2 (two) times daily. 04/24/16  Yes Patrecia Pour, MD  potassium chloride (K-DUR,KLOR-CON) 10 MEQ tablet Take 10 mEq by mouth every morning.    Yes Historical Provider, MD  senna-docusate (SENOKOT-S) 8.6-50 MG tablet Take 1 tablet by mouth 2 (two) times daily. 03/26/16  Yes Wyatt Portela, MD  Tiotropium Bromide-Olodaterol (STIOLTO RESPIMAT) 2.5-2.5 MCG/ACT AERS Inhale 2 puffs into the lungs daily. Patient taking differently: Inhale 2 puffs into the lungs every morning.  03/04/16  Yes Brand Males, MD    Family History Family History  Problem Relation Age of Onset  . Coronary artery disease Mother     deceased. coronary artery bypass graft  . Cancer Mother     BREAST  . Heart disease Mother     PVD  - Amputation-Leg  . Hyperlipidemia Mother   . Hypertension Mother   . Heart attack Mother   . Cirrhosis Father     deceased  . Cancer Father    . Other Brother     alive, unknown hx  . Stroke Maternal Aunt     Social History Social History  Substance Use Topics  . Smoking status: Former Smoker    Packs/day: 1.50    Years: 30.00    Types: Cigarettes    Quit date: 08/12/2009  . Smokeless tobacco: Never Used     Comment: she has smoker 1-2 packs daily since the age of 23. 41 pack years  . Alcohol use No     Allergies   Ace inhibitors; Amoxicillin-pot clavulanate; and Codeine   Review of Systems Review of Systems  Unable to perform ROS: Acuity of condition     Physical Exam Updated Vital Signs BP (!) 163/98   Pulse (!) 131   Temp (!) 96.7 F (35.9 C) (Axillary)   Resp (!) 35   Ht 5\' 5"  (1.651 m)   Wt 178 lb 5.6 oz (80.9 kg)   SpO2 92%   BMI 29.68 kg/m   Physical Exam  Constitutional: She appears well-developed and well-nourished. She appears distressed.  HENT:  Head: Normocephalic and atraumatic.  Eyes: Pupils are equal, round, and reactive to light.  Neck: Normal range of motion.  Cardiovascular: Normal rate and intact distal pulses.   Pulmonary/Chest: She is in respiratory distress.  Abdominal: Normal appearance. She exhibits no distension.  Musculoskeletal: Normal range of motion.  Neurological: She is alert. No cranial nerve deficit.  Skin: Skin is warm and dry. No rash noted.  Nursing note and vitals reviewed.    ED Treatments / Results  Labs (all labs ordered are listed, but only abnormal results are displayed) Labs Reviewed  BASIC METABOLIC PANEL - Abnormal; Notable for the following:       Result Value   Sodium 134 (*)    Glucose, Bld 281 (*)    All other components within normal limits  CBC - Abnormal; Notable for the following:    WBC 13.6 (*)    RDW 16.6 (*)    All other components within normal limits  URINALYSIS, ROUTINE W REFLEX MICROSCOPIC (NOT AT [facility]) - Abnormal; Notable for the following:    Glucose, UA 500 (*)    Hgb urine dipstick MODERATE (*)    Ketones,  ur TRACE (*)     Protein, ur 100 (*)    All other components within normal limits  BLOOD GAS, ARTERIAL - Abnormal; Notable for the following:    pH, Arterial 7.49 (*)    pO2, Arterial 63.0 (*)    All other components within normal limits  URINE MICROSCOPIC-ADD ON - Abnormal; Notable for the following:    Squamous Epithelial / LPF 0-5 (*)    Bacteria, UA FEW (*)    All other components within normal limits  HEMOGLOBIN A1C - Abnormal; Notable for the following:    Hgb A1c MFr Bld 8.5 (*)    All other components within normal limits  BLOOD GAS, ARTERIAL - Abnormal; Notable for the following:    pH, Arterial 7.325 (*)    pO2, Arterial 78.3 (*)    Acid-Base Excess 3.6 (*)    All other components within normal limits  BRAIN NATRIURETIC PEPTIDE - Abnormal; Notable for the following:    B Natriuretic Peptide 370.0 (*)    All other components within normal limits  BLOOD GAS, ARTERIAL - Abnormal; Notable for the following:    pO2, Arterial 72.2 (*)    All other components within normal limits  GLUCOSE, CAPILLARY - Abnormal; Notable for the following:    Glucose-Capillary 385 (*)    All other components within normal limits  BASIC METABOLIC PANEL - Abnormal; Notable for the following:    Sodium 131 (*)    Chloride 100 (*)    CO2 21 (*)    Glucose, Bld 462 (*)    BUN 33 (*)    Creatinine, Ser 1.10 (*)    GFR calc non Af Amer 52 (*)    All other components within normal limits  CBC - Abnormal; Notable for the following:    WBC 14.7 (*)    RDW 17.0 (*)    All other components within normal limits  GLUCOSE, CAPILLARY - Abnormal; Notable for the following:    Glucose-Capillary 430 (*)    All other components within normal limits  GLUCOSE, CAPILLARY - Abnormal; Notable for the following:    Glucose-Capillary 375 (*)    All other components within normal limits  BLOOD GAS, ARTERIAL - Abnormal; Notable for the following:    pO2, Arterial 66.8 (*)    All other components within normal limits  GLUCOSE,  CAPILLARY - Abnormal; Notable for the following:    Glucose-Capillary 358 (*)    All other components within normal limits  CBG MONITORING, ED - Abnormal; Notable for the following:    Glucose-Capillary 274 (*)    All other components within normal limits  CULTURE, BLOOD (ROUTINE X 2)  CULTURE, BLOOD (ROUTINE X 2)  MRSA PCR SCREENING  HIV ANTIBODY (ROUTINE TESTING)  I-STAT CG4 LACTIC ACID, ED    EKG  EKG Interpretation  Date/Time:  Monday April 28 2016 09:18:40 EDT Ventricular Rate:  108 PR Interval:    QRS Duration: 82 QT Interval:  381 QTC Calculation: 511 R Axis:   64 Text Interpretation:  Atrial fibrillation Nonspecific repol abnormality, diffuse leads Prolonged QT interval No significant change since last tracing Confirmed by Taleeya Blondin  MD, Cordale Manera ([id number]) on 04/28/2016 10:51:35 AM       Radiology No results found.  IMPRESSION: Bilateral pulmonary nodules that are increased from 11/27/2015. Based on recent spinal CT this is likely a combination of known metastases and multi focal pneumonia.  Procedures Procedures (including critical care time)  Medications Ordered in ED Medications  0.9 %  sodium chloride infusion ( Intravenous Transfusing/Transfer 04/28/16 1229)  ceFEPIme (MAXIPIME) 2 g in dextrose 5 % 50 mL IVPB (2 g Intravenous Transfusing/Transfer 04/28/16 1229)  vancomycin (VANCOCIN) IVPB 1000 mg/200 mL premix (0 mg Intravenous Stopped 04/28/16 1227)  furosemide (LASIX) injection 40 mg (40 mg Intravenous Given 04/28/16 2240)  LORazepam (ATIVAN) injection 0.5 mg (0.5 mg Intravenous Given 04/28/16 2341)  morphine 2 MG/ML injection 2 mg (0 mg Intravenous Duplicate [id number] [id number])  morphine 4 MG/ML injection (2 mg  Given 05/04/16 0108)  furosemide (LASIX) injection 80 mg (80 mg Intravenous Given 05/04/2016 0820)  diltiazem (CARDIZEM) 1 mg/mL load via infusion 10 mg (10 mg Intravenous Bolus from Bag May 04, 2016 0820)  insulin glargine (LANTUS) injection 10 Units (10  Units Subcutaneous Given 05-04-2016 1230)  insulin aspart (novoLOG) injection 20 Units (20 Units Subcutaneous Given 04-May-2016 1200)  fentaNYL (SUBLIMAZE) injection 100 mcg (100 mcg Intravenous Given May 04, 2016 1520)     Initial Impression / Assessment and Plan / ED Course  I have reviewed the triage vital signs and the nursing notes.  Pertinent labs & imaging results that were available during my care of the patient were reviewed by me and considered in my medical decision making (see chart for details).  Clinical Course       Final Clinical Impressions(s) / ED Diagnoses   Final diagnoses:  SOB (shortness of breath)    New Prescriptions Discharge Medication List as of 05-04-2016  4:57 PM       Leonard Schwartz, MD 06/15/16 [phone]

## 2016-10-29 IMAGING — MR MR LUMBAR SPINE W/O CM
3 series · 25 of 39 positions shown · non-contrast
Comparison: CT same day

CLINICAL DATA: Metastatic renal cell cancer. Lower extremity
weakness and difficulty walking.

EXAM:
MRI LUMBAR SPINE WITHOUT CONTRAST
TECHNIQUE: Multiplanar, multisequence MR imaging of the lumbar spine was
performed. No intravenous contrast was administered.

[Series 1: loc · axial · 5.0mm · 1.33mm/px · z∈[-20,+169]mm · 9 of 15 slices shown]
[im 1/15]
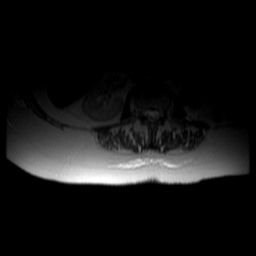
[im 3/15]
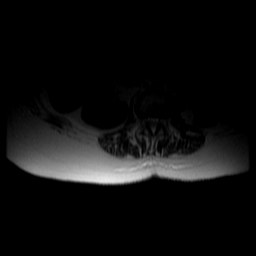
[im 5/15]
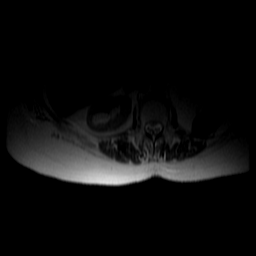
[im 7/15]
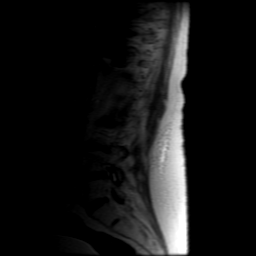
[im 8/15]
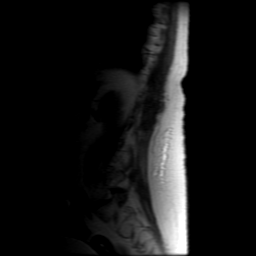
[im 9/15]
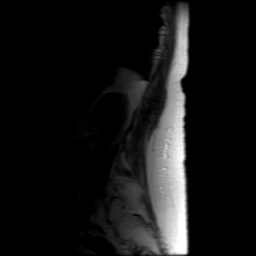
[im 11/15]
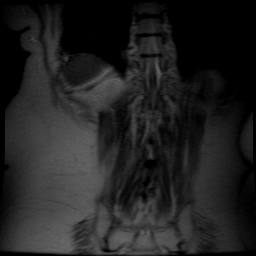
[im 13/15]
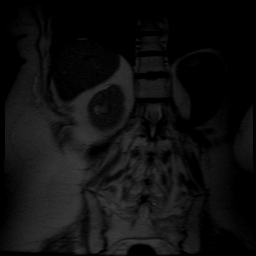
[im 15/15]
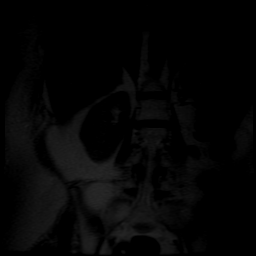

[Series 3: T1 · sagittal · 4.0mm · 0.51mm/px · 9 of 12 slices shown]
[im 1/12]
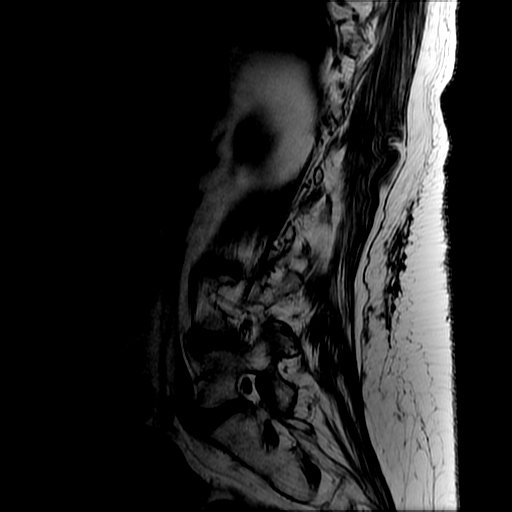
[im 3/12]
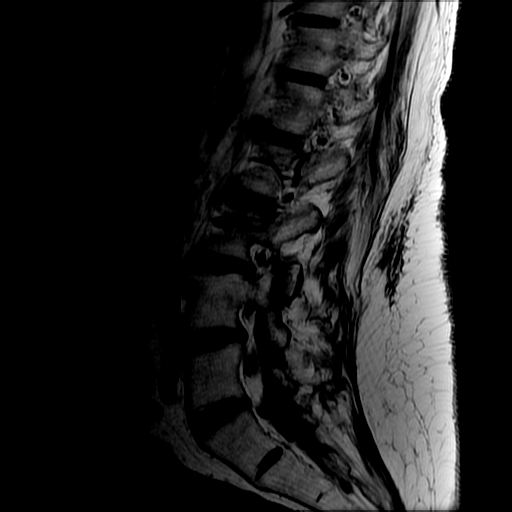
[im 4/12]
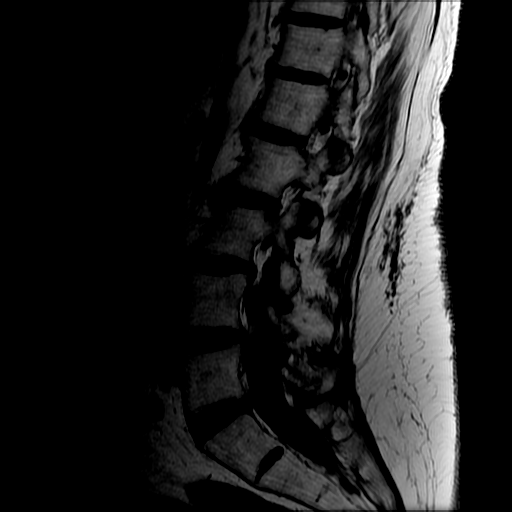
[im 6/12]
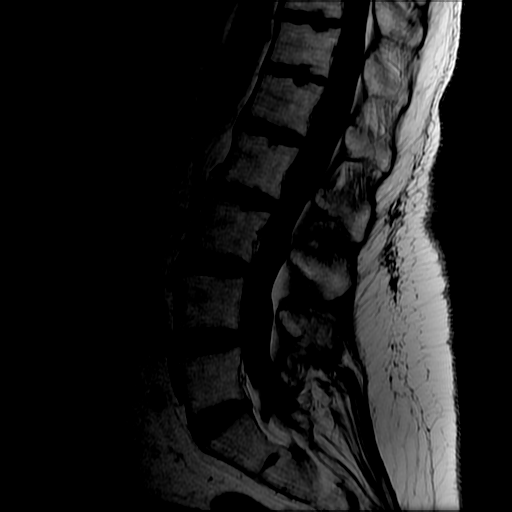
[im 7/12]
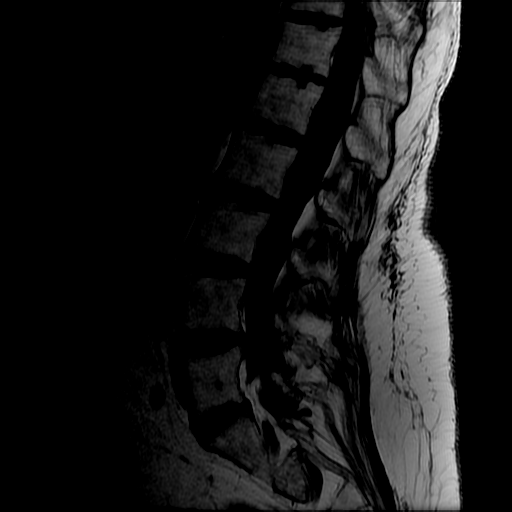
[im 9/12]
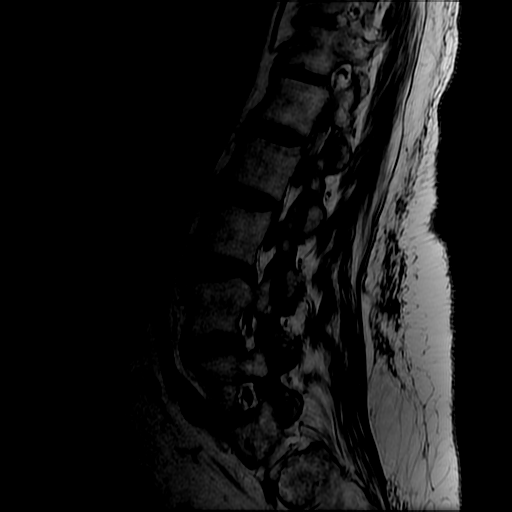
[im 10/12]
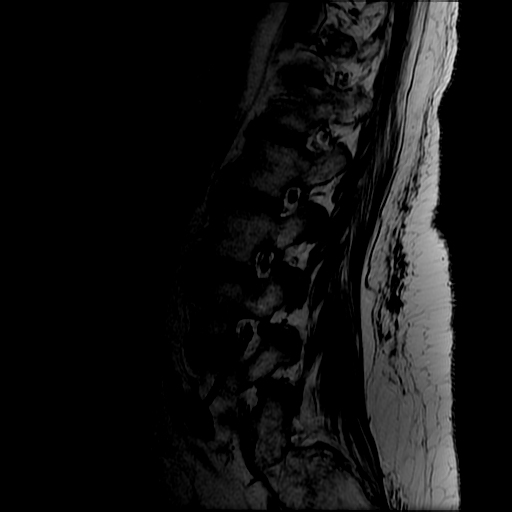
[im 11/12]
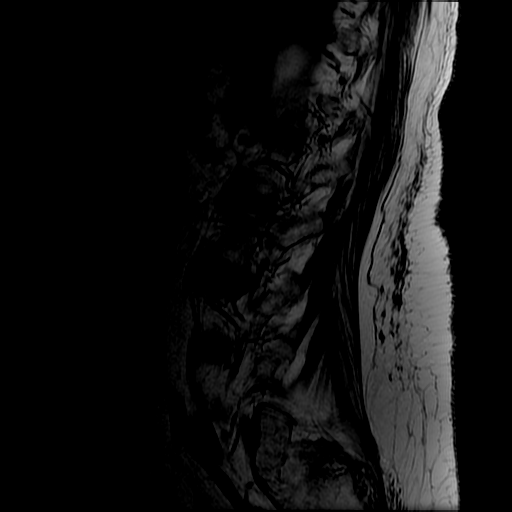
[im 12/12]
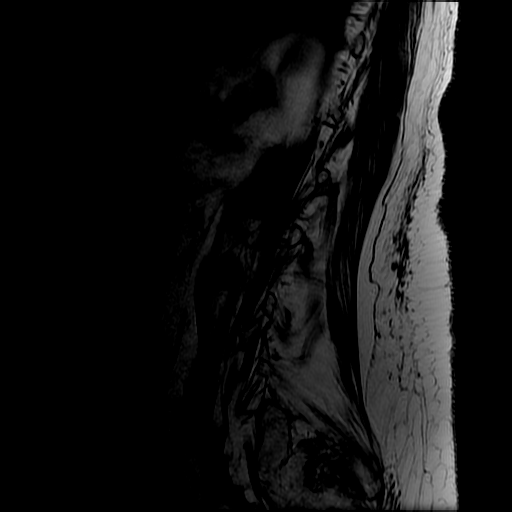

[Series 4: sag ir · sagittal · 4.0mm · 0.51mm/px · 7 of 12 slices shown]
[im 1/12]
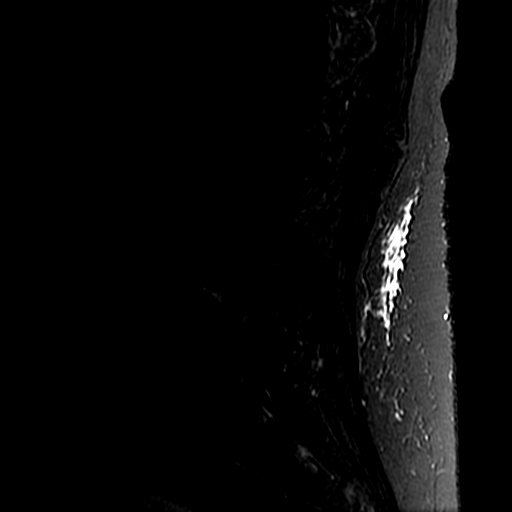
[im 3/12]
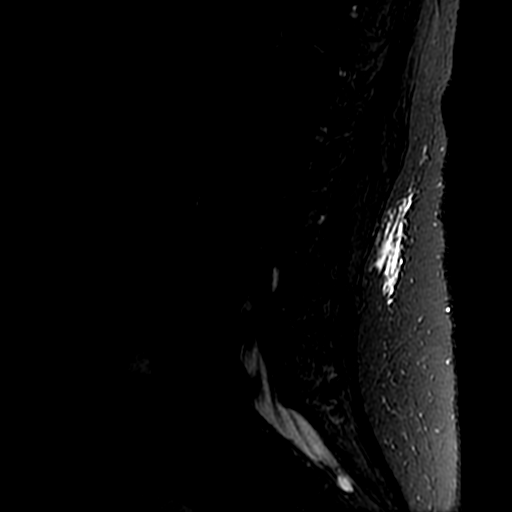
[im 4/12]
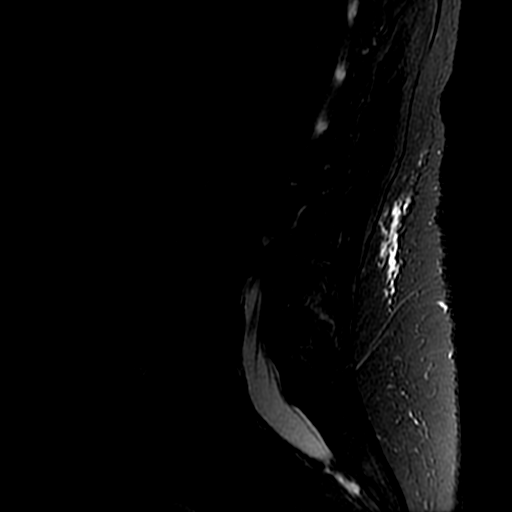
[im 6/12]
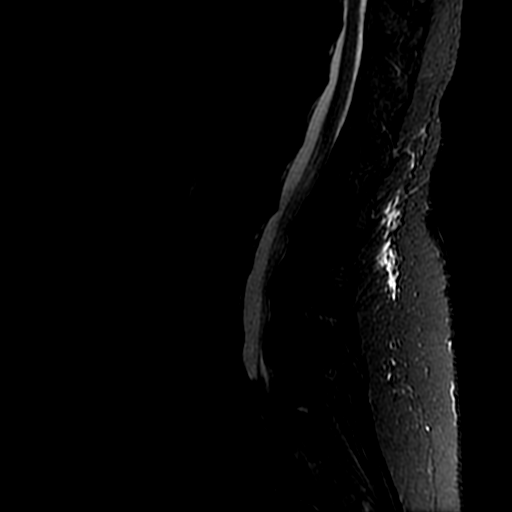
[im 7/12]
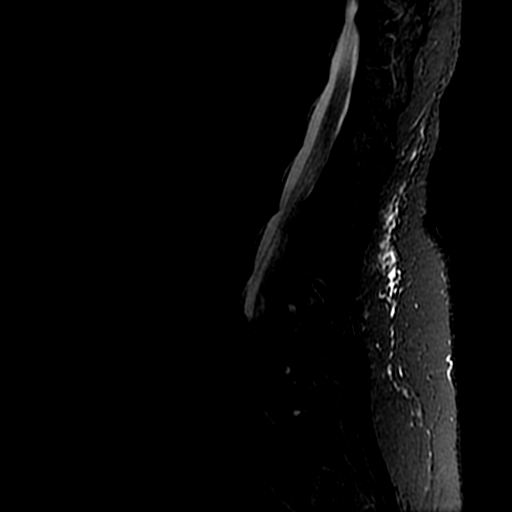
[im 9/12]
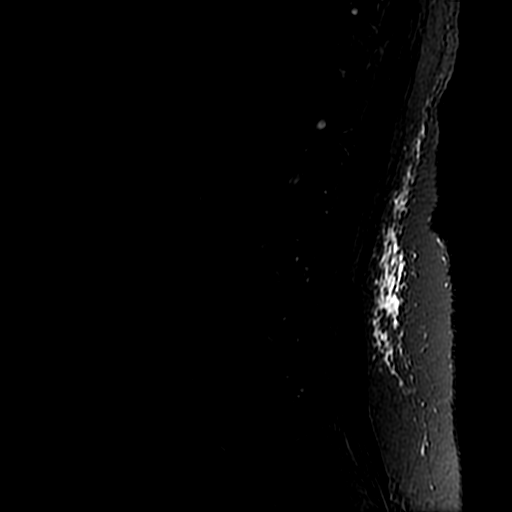
[im 11/12]
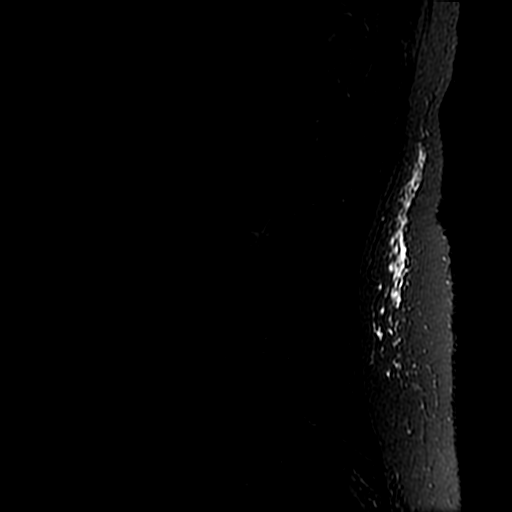

[25 of 39 positions shown; findings below may reference images not displayed]

FINDINGS: The patient became claustrophobic and terminated the examination
after T sagittal sequences.

Segmentation:  5 lumbar type vertebral bodies.

Alignment:  Minimal curvature convex to the left.

Vertebrae: Fatty marrow suggesting previous radiation. No marrow
space lesion. No fracture.

Conus medullaris: Extends to the L1-2 level and appears normal. The
distal cord and conus region appear normal.

Paraspinal and other soft tissues: No axial images for evaluation.

Disc levels:

Mild non-compressive bulging of the L2-3 disc. The other levels are
negative. There is no central canal stenosis. No foraminal stenosis.
No edematous facet arthropathy.
IMPRESSION: Study terminated because of severe patient claustrophobia. We see
the region from the inferior endplate of T11 through S2, in there is
no evidence of metastatic disease to this region. There is no spinal
stenosis. The distal cord and nerve roots appear normal. The study
does not show an explanation for the clinical presentation.
